# Patient Record
Sex: Female | Born: 1935 | Race: White | Hispanic: No | Marital: Married | Smoking: Never smoker
Health system: Southern US, Community
[De-identification: ages and names within clinical notes are randomized; demographics above are authoritative.]

## PROBLEM LIST (undated history)

## (undated) ENCOUNTER — Emergency Department: Admission: EM | Payer: Medicare Other | Source: Home / Self Care

## (undated) DIAGNOSIS — E785 Hyperlipidemia, unspecified: Secondary | ICD-10-CM

## (undated) DIAGNOSIS — N390 Urinary tract infection, site not specified: Secondary | ICD-10-CM

## (undated) DIAGNOSIS — I10 Essential (primary) hypertension: Secondary | ICD-10-CM

## (undated) DIAGNOSIS — M797 Fibromyalgia: Secondary | ICD-10-CM

## (undated) DIAGNOSIS — I2510 Atherosclerotic heart disease of native coronary artery without angina pectoris: Secondary | ICD-10-CM

## (undated) DIAGNOSIS — G4733 Obstructive sleep apnea (adult) (pediatric): Secondary | ICD-10-CM

## (undated) DIAGNOSIS — Z5189 Encounter for other specified aftercare: Secondary | ICD-10-CM

## (undated) DIAGNOSIS — M1990 Unspecified osteoarthritis, unspecified site: Secondary | ICD-10-CM

## (undated) DIAGNOSIS — Z8719 Personal history of other diseases of the digestive system: Secondary | ICD-10-CM

## (undated) DIAGNOSIS — I639 Cerebral infarction, unspecified: Secondary | ICD-10-CM

## (undated) DIAGNOSIS — F329 Major depressive disorder, single episode, unspecified: Secondary | ICD-10-CM

## (undated) DIAGNOSIS — R9439 Abnormal result of other cardiovascular function study: Secondary | ICD-10-CM

## (undated) DIAGNOSIS — R079 Chest pain, unspecified: Secondary | ICD-10-CM

## (undated) DIAGNOSIS — K219 Gastro-esophageal reflux disease without esophagitis: Secondary | ICD-10-CM

## (undated) DIAGNOSIS — F32A Depression, unspecified: Secondary | ICD-10-CM

## (undated) HISTORY — DX: Encounter for other specified aftercare: Z51.89

## (undated) HISTORY — PX: BACK SURGERY: SHX140

## (undated) HISTORY — DX: Essential (primary) hypertension: I10

## (undated) HISTORY — DX: Fibromyalgia: M79.7

## (undated) HISTORY — DX: Cerebral infarction, unspecified: I63.9

## (undated) HISTORY — DX: Chest pain, unspecified: R07.9

## (undated) HISTORY — PX: CARPAL TUNNEL RELEASE: SHX101

## (undated) HISTORY — DX: Hyperlipidemia, unspecified: E78.5

## (undated) HISTORY — PX: OTHER SURGICAL HISTORY: SHX169

## (undated) HISTORY — DX: Obstructive sleep apnea (adult) (pediatric): G47.33

## (undated) HISTORY — DX: Gastro-esophageal reflux disease without esophagitis: K21.9

## (undated) HISTORY — DX: Major depressive disorder, single episode, unspecified: F32.9

## (undated) HISTORY — DX: Unspecified osteoarthritis, unspecified site: M19.90

## (undated) HISTORY — DX: Depression, unspecified: F32.A

## (undated) HISTORY — DX: Atherosclerotic heart disease of native coronary artery without angina pectoris: I25.10

## (undated) HISTORY — DX: Urinary tract infection, site not specified: N39.0

## (undated) HISTORY — DX: Abnormal result of other cardiovascular function study: R94.39

---

## 1962-08-21 HISTORY — PX: OTHER SURGICAL HISTORY: SHX169

## 1962-08-21 HISTORY — PX: APPENDECTOMY: SHX54

## 1962-08-21 HISTORY — PX: OVARIAN CYST REMOVAL: SHX89

## 1974-08-21 HISTORY — PX: ABDOMINAL HYSTERECTOMY: SHX81

## 1975-08-22 HISTORY — PX: OTHER SURGICAL HISTORY: SHX169

## 1993-08-21 HISTORY — PX: BREAST BIOPSY: SHX20

## 1998-05-19 ENCOUNTER — Emergency Department (HOSPITAL_COMMUNITY): Admission: EM | Admit: 1998-05-19 | Discharge: 1998-05-19 | Payer: Self-pay | Admitting: Emergency Medicine

## 1998-09-08 ENCOUNTER — Inpatient Hospital Stay (HOSPITAL_COMMUNITY): Admission: RE | Admit: 1998-09-08 | Discharge: 1998-09-20 | Payer: Self-pay | Admitting: Addiction Psychiatry

## 1998-09-21 ENCOUNTER — Encounter (HOSPITAL_COMMUNITY): Admission: RE | Admit: 1998-09-21 | Discharge: 1998-12-20 | Payer: Self-pay | Admitting: Addiction Psychiatry

## 1999-02-07 ENCOUNTER — Other Ambulatory Visit: Admission: RE | Admit: 1999-02-07 | Discharge: 1999-02-07 | Payer: Self-pay | Admitting: *Deleted

## 1999-09-26 ENCOUNTER — Encounter: Admission: RE | Admit: 1999-09-26 | Discharge: 1999-09-26 | Payer: Self-pay

## 2000-05-09 ENCOUNTER — Encounter: Admission: RE | Admit: 2000-05-09 | Discharge: 2000-05-09 | Payer: Self-pay | Admitting: Family Medicine

## 2000-05-09 ENCOUNTER — Encounter: Payer: Self-pay | Admitting: Family Medicine

## 2000-08-22 ENCOUNTER — Other Ambulatory Visit: Admission: RE | Admit: 2000-08-22 | Discharge: 2000-08-22 | Payer: Self-pay | Admitting: *Deleted

## 2002-03-24 ENCOUNTER — Encounter: Payer: Self-pay | Admitting: Family Medicine

## 2002-03-24 ENCOUNTER — Encounter: Admission: RE | Admit: 2002-03-24 | Discharge: 2002-03-24 | Payer: Self-pay | Admitting: Family Medicine

## 2002-06-19 ENCOUNTER — Other Ambulatory Visit: Admission: RE | Admit: 2002-06-19 | Discharge: 2002-06-19 | Payer: Self-pay | Admitting: Family Medicine

## 2003-08-04 ENCOUNTER — Encounter: Admission: RE | Admit: 2003-08-04 | Discharge: 2003-08-04 | Payer: Self-pay | Admitting: Family Medicine

## 2003-08-22 HISTORY — PX: CATARACT EXTRACTION, BILATERAL: SHX1313

## 2004-05-27 ENCOUNTER — Encounter: Payer: Self-pay | Admitting: Family Medicine

## 2006-09-21 ENCOUNTER — Other Ambulatory Visit: Admission: RE | Admit: 2006-09-21 | Discharge: 2006-09-21 | Payer: Self-pay | Admitting: Family Medicine

## 2006-10-01 ENCOUNTER — Encounter: Payer: Self-pay | Admitting: Family Medicine

## 2007-03-14 ENCOUNTER — Encounter: Payer: Self-pay | Admitting: Family Medicine

## 2007-03-14 LAB — CONVERTED CEMR LAB: Pap Smear: NORMAL

## 2007-08-22 HISTORY — PX: CARDIOVASCULAR STRESS TEST: SHX262

## 2007-10-09 ENCOUNTER — Encounter: Payer: Self-pay | Admitting: Family Medicine

## 2007-10-09 LAB — CONVERTED CEMR LAB
ALT: 32 units/L
AST: 29 units/L
Albumin: 4.6 g/dL
Alkaline Phosphatase: 91 units/L
Calcium: 9.8 mg/dL
Chloride: 103 meq/L
Cholesterol: 182 mg/dL
GFR calc non Af Amer: >59 mL/min
Glucose, Bld: 97 mg/dL
LDL Cholesterol: 103 mg/dL
MCHC: 33.7 g/dL
MCV: 96 fL
Potassium: 4.2 meq/L
RBC: 4.51 M/uL
RDW: 13.3 %
Sodium: 143 meq/L
VLDL: 15 mg/dL

## 2008-02-28 ENCOUNTER — Encounter: Payer: Self-pay | Admitting: Family Medicine

## 2008-02-28 LAB — CONVERTED CEMR LAB
ALT: 30 units/L
AST: 34 units/L
Albumin: 4 g/dL
Alkaline Phosphatase: 75 units/L
Anion Gap: 12.4
BUN: 12 mg/dL
CO2: 31 meq/L
Calcium: 9.8 mg/dL
Chloride: 104 meq/L
Cholesterol: 214 mg/dL
Creatinine, Ser: 0.8 mg/dL
Direct LDL: 135 mg/dL
GFR calc Af Amer: 70.12 mL/min
GFR calc non Af Amer: 84.84 mL/min
Glucose, Bld: 95 mg/dL
HCT: 44.1 %
HDL: 64 mg/dL
Hemoglobin: 15.1 g/dL
MCHC: 34.2 g/dL
MCV: 96.4 fL
Platelets: 218 10*3/uL
Potassium: 5.4 meq/L
RBC: 4.58 M/uL
RDW: 12.4 %
Sodium: 142 meq/L
Total Bilirubin: 1.1 mg/dL
Total CHOL/HDL Ratio: 3.34
Total Protein: 6.7 g/dL
Triglycerides: 83 mg/dL
Vit D, 25-Hydroxy: 24.3 ng/mL
WBC: 5.3 10*3/uL

## 2008-04-23 ENCOUNTER — Encounter (INDEPENDENT_AMBULATORY_CARE_PROVIDER_SITE_OTHER): Payer: Self-pay | Admitting: *Deleted

## 2008-05-15 ENCOUNTER — Emergency Department (HOSPITAL_COMMUNITY): Admission: EM | Admit: 2008-05-15 | Discharge: 2008-05-15 | Payer: Self-pay | Admitting: Emergency Medicine

## 2008-06-08 ENCOUNTER — Encounter: Payer: Self-pay | Admitting: Family Medicine

## 2008-06-08 LAB — CONVERTED CEMR LAB
ALT: 27 units/L
AST: 26 units/L
Alkaline Phosphatase: 96 units/L
Bilirubin, Direct: 0.13 mg/dL
Direct LDL: 133 mg/dL
HDL: 51 mg/dL
Total Bilirubin: 0.5 mg/dL

## 2008-06-22 ENCOUNTER — Encounter (INDEPENDENT_AMBULATORY_CARE_PROVIDER_SITE_OTHER): Payer: Self-pay | Admitting: *Deleted

## 2008-08-31 ENCOUNTER — Encounter: Payer: Self-pay | Admitting: Family Medicine

## 2008-08-31 LAB — CONVERTED CEMR LAB
ALT: 28 units/L
AST: 28 units/L
Albumin: 3.9 g/dL
Alkaline Phosphatase: 75 units/L
Bilirubin, Direct: 0.2 mg/dL
Cholesterol: 195 mg/dL
Direct LDL: 115 mg/dL
HDL: 61 mg/dL
Indirect Bilirubin: 0.5 mg/dL
Total Bilirubin: 0.7 mg/dL
Total CHOL/HDL Ratio: 3.2
Total Protein: 6.7 g/dL
Triglycerides: 102 mg/dL

## 2008-10-08 ENCOUNTER — Encounter: Payer: Self-pay | Admitting: Family Medicine

## 2008-10-08 LAB — CONVERTED CEMR LAB
Anion Gap: 12.8
BUN: 11 mg/dL
CO2: 30 meq/L
Chloride: 105 meq/L
Glucose, Bld: 94 mg/dL
Potassium: 4.8 meq/L

## 2008-12-08 ENCOUNTER — Encounter: Payer: Self-pay | Admitting: Family Medicine

## 2009-01-04 ENCOUNTER — Encounter (INDEPENDENT_AMBULATORY_CARE_PROVIDER_SITE_OTHER): Payer: Self-pay | Admitting: *Deleted

## 2009-04-09 ENCOUNTER — Encounter: Admission: RE | Admit: 2009-04-09 | Discharge: 2009-04-09 | Payer: Self-pay | Admitting: Specialist

## 2009-04-12 ENCOUNTER — Encounter: Payer: Self-pay | Admitting: Family Medicine

## 2009-04-25 ENCOUNTER — Encounter: Admission: RE | Admit: 2009-04-25 | Discharge: 2009-04-25 | Payer: Self-pay | Admitting: Specialist

## 2009-05-27 ENCOUNTER — Encounter: Payer: Self-pay | Admitting: Family Medicine

## 2009-07-22 ENCOUNTER — Encounter: Payer: Self-pay | Admitting: Family Medicine

## 2009-07-22 LAB — CONVERTED CEMR LAB
ALT: 36 units/L
AST: 31 units/L
Albumin: 4 g/dL
Anion Gap: 12.3
CO2: 29 meq/L
Calcium: 9.5 mg/dL
Chloride: 103 meq/L
Sodium: 140 meq/L
Vit D, 25-Hydroxy: 43.3 ng/mL

## 2009-08-21 LAB — HM DEXA SCAN

## 2009-10-12 ENCOUNTER — Encounter: Payer: Self-pay | Admitting: Family Medicine

## 2009-10-12 LAB — CONVERTED CEMR LAB
Cholesterol: 170 mg/dL
Direct LDL: 89 mg/dL
Triglycerides: 71 mg/dL

## 2010-02-25 ENCOUNTER — Encounter: Payer: Self-pay | Admitting: Family Medicine

## 2010-02-25 DIAGNOSIS — R32 Unspecified urinary incontinence: Secondary | ICD-10-CM | POA: Insufficient documentation

## 2010-02-25 DIAGNOSIS — K219 Gastro-esophageal reflux disease without esophagitis: Secondary | ICD-10-CM | POA: Insufficient documentation

## 2010-02-25 DIAGNOSIS — Z87448 Personal history of other diseases of urinary system: Secondary | ICD-10-CM | POA: Insufficient documentation

## 2010-02-25 DIAGNOSIS — E785 Hyperlipidemia, unspecified: Secondary | ICD-10-CM | POA: Insufficient documentation

## 2010-02-25 DIAGNOSIS — Z9189 Other specified personal risk factors, not elsewhere classified: Secondary | ICD-10-CM | POA: Insufficient documentation

## 2010-02-25 DIAGNOSIS — F329 Major depressive disorder, single episode, unspecified: Secondary | ICD-10-CM | POA: Insufficient documentation

## 2010-03-01 DIAGNOSIS — M791 Myalgia, unspecified site: Secondary | ICD-10-CM | POA: Insufficient documentation

## 2010-03-01 DIAGNOSIS — F411 Generalized anxiety disorder: Secondary | ICD-10-CM | POA: Insufficient documentation

## 2010-03-01 DIAGNOSIS — E7800 Pure hypercholesterolemia, unspecified: Secondary | ICD-10-CM | POA: Insufficient documentation

## 2010-03-01 DIAGNOSIS — M790 Rheumatism, unspecified: Secondary | ICD-10-CM | POA: Insufficient documentation

## 2010-03-01 DIAGNOSIS — K449 Diaphragmatic hernia without obstruction or gangrene: Secondary | ICD-10-CM | POA: Insufficient documentation

## 2010-03-01 DIAGNOSIS — M159 Polyosteoarthritis, unspecified: Secondary | ICD-10-CM | POA: Insufficient documentation

## 2010-03-01 DIAGNOSIS — M810 Age-related osteoporosis without current pathological fracture: Secondary | ICD-10-CM | POA: Insufficient documentation

## 2010-03-01 DIAGNOSIS — M797 Fibromyalgia: Secondary | ICD-10-CM | POA: Insufficient documentation

## 2010-03-01 DIAGNOSIS — K224 Dyskinesia of esophagus: Secondary | ICD-10-CM | POA: Insufficient documentation

## 2010-03-15 ENCOUNTER — Ambulatory Visit: Payer: Self-pay | Admitting: Family Medicine

## 2010-05-04 ENCOUNTER — Encounter: Payer: Self-pay | Admitting: Family Medicine

## 2010-05-05 ENCOUNTER — Encounter: Payer: Self-pay | Admitting: Family Medicine

## 2010-05-09 ENCOUNTER — Encounter: Payer: Self-pay | Admitting: Family Medicine

## 2010-05-23 ENCOUNTER — Ambulatory Visit: Payer: Self-pay | Admitting: Family Medicine

## 2010-05-23 DIAGNOSIS — M899 Disorder of bone, unspecified: Secondary | ICD-10-CM | POA: Insufficient documentation

## 2010-05-23 DIAGNOSIS — M949 Disorder of cartilage, unspecified: Secondary | ICD-10-CM

## 2010-05-25 LAB — CONVERTED CEMR LAB: Calcium, Total (PTH): 9.3 mg/dL (ref 8.4–10.5)

## 2010-06-15 ENCOUNTER — Ambulatory Visit: Payer: Self-pay | Admitting: Family Medicine

## 2010-06-16 ENCOUNTER — Ambulatory Visit: Payer: Self-pay | Admitting: Family Medicine

## 2010-06-16 ENCOUNTER — Telehealth: Payer: Self-pay | Admitting: Family Medicine

## 2010-06-28 ENCOUNTER — Telehealth: Payer: Self-pay | Admitting: Family Medicine

## 2010-07-13 ENCOUNTER — Encounter: Admission: RE | Admit: 2010-07-13 | Discharge: 2010-07-13 | Payer: Self-pay | Admitting: Neurosurgery

## 2010-08-04 ENCOUNTER — Encounter: Payer: Self-pay | Admitting: Family Medicine

## 2010-09-02 ENCOUNTER — Telehealth (INDEPENDENT_AMBULATORY_CARE_PROVIDER_SITE_OTHER): Payer: Self-pay | Admitting: *Deleted

## 2010-09-05 ENCOUNTER — Encounter: Payer: Self-pay | Admitting: Family Medicine

## 2010-09-05 ENCOUNTER — Ambulatory Visit
Admission: RE | Admit: 2010-09-05 | Discharge: 2010-09-05 | Payer: Self-pay | Source: Home / Self Care | Attending: Family Medicine | Admitting: Family Medicine

## 2010-09-05 ENCOUNTER — Other Ambulatory Visit: Payer: Self-pay | Admitting: Family Medicine

## 2010-09-05 LAB — LIPID PANEL
Cholesterol: 193 mg/dL (ref 0–200)
HDL: 61.9 mg/dL (ref >39.00)
LDL Cholesterol: 114 mg/dL — ABNORMAL HIGH (ref 0–99)
Total CHOL/HDL Ratio: 3
Triglycerides: 85 mg/dL (ref 0.0–149.0)
VLDL: 17 mg/dL (ref 0.0–40.0)

## 2010-09-05 LAB — CBC WITH DIFFERENTIAL/PLATELET
Basophils Absolute: 0.1 10*3/uL (ref 0.0–0.1)
Basophils Relative: 0.8 % (ref 0.0–3.0)
Eosinophils Absolute: 0.2 10*3/uL (ref 0.0–0.7)
Eosinophils Relative: 2.8 % (ref 0.0–5.0)
HCT: 43.2 % (ref 36.0–46.0)
Hemoglobin: 14.7 g/dL (ref 12.0–15.0)
Lymphocytes Relative: 31.2 % (ref 12.0–46.0)
Lymphs Abs: 2.2 10*3/uL (ref 0.7–4.0)
MCHC: 34.1 g/dL (ref 30.0–36.0)
MCV: 98 fl (ref 78.0–100.0)
Monocytes Absolute: 0.4 10*3/uL (ref 0.1–1.0)
Monocytes Relative: 6 % (ref 3.0–12.0)
Neutro Abs: 4.1 10*3/uL (ref 1.4–7.7)
Neutrophils Relative %: 59.2 % (ref 43.0–77.0)
Platelets: 229 10*3/uL (ref 150.0–400.0)
RBC: 4.4 Mil/uL (ref 3.87–5.11)
RDW: 12.9 % (ref 11.5–14.6)
WBC: 7 10*3/uL (ref 4.5–10.5)

## 2010-09-05 LAB — BASIC METABOLIC PANEL
BUN: 16 mg/dL (ref 6–23)
CO2: 30 mEq/L (ref 19–32)
Calcium: 8.9 mg/dL (ref 8.4–10.5)
Chloride: 102 mEq/L (ref 96–112)
Creatinine, Ser: 0.8 mg/dL (ref 0.4–1.2)
GFR: 78.39 mL/min (ref >60.00)
Glucose, Bld: 108 mg/dL — ABNORMAL HIGH (ref 70–99)
Potassium: 4.1 mEq/L (ref 3.5–5.1)
Sodium: 139 mEq/L (ref 135–145)

## 2010-09-05 LAB — HEPATIC FUNCTION PANEL
ALT: 24 U/L (ref 0–35)
AST: 25 U/L (ref 0–37)
Albumin: 4 g/dL (ref 3.5–5.2)
Alkaline Phosphatase: 81 U/L (ref 39–117)
Bilirubin, Direct: 0.1 mg/dL (ref 0.0–0.3)
Total Bilirubin: 0.8 mg/dL (ref 0.3–1.2)
Total Protein: 6.8 g/dL (ref 6.0–8.3)

## 2010-09-06 LAB — CONVERTED CEMR LAB: Vit D, 25-Hydroxy: 37 ng/mL (ref 30–89)

## 2010-09-12 ENCOUNTER — Ambulatory Visit
Admission: RE | Admit: 2010-09-12 | Discharge: 2010-09-12 | Payer: Self-pay | Source: Home / Self Care | Attending: Family Medicine | Admitting: Family Medicine

## 2010-09-12 ENCOUNTER — Encounter: Payer: Self-pay | Admitting: Family Medicine

## 2010-09-12 DIAGNOSIS — R7309 Other abnormal glucose: Secondary | ICD-10-CM | POA: Insufficient documentation

## 2010-09-15 ENCOUNTER — Encounter: Payer: Self-pay | Admitting: Family Medicine

## 2010-09-22 NOTE — Letter (Signed)
Summary: [facility]: Immunoassay Fecal Occult Blood (iFOB) Order Form  [place] [facility]  [address] [place], [place] [address]   Phone: [phone]  Fax: [phone]      [facility]: Immunoassay Fecal Occult Blood (iFOB) Order Form   September 12, 2010 MRN: [id number]   [facility] 1931-07-10   Physicican Name:________[email or web address]_________________  Diagnosis Code:_________duncan_________________      Crawford Givens MD

## 2010-09-22 NOTE — Progress Notes (Signed)
Summary: Patient Med & Allergy List  Patient Med & Allergy List   Imported By: Lanelle Bal 03/21/2010 08:44:48  _____________________________________________________________________  External Attachment:    Type:   Image     Comment:   External Document

## 2010-09-22 NOTE — Assessment & Plan Note (Signed)
Summary: PAIN IN LEFT SIDE/DLO   Vital Signs:  Patient profile:   77 year old female Height:      61.5 inches Weight:      179.50 pounds BMI:     33.49 Temp:     98.6 degrees F oral Pulse rate:   80 / minute Pulse rhythm:   regular BP sitting:   142 / 82  (left arm) Cuff size:   regular  Vitals Entered By: Delilah Shan CMA Duncan Dull) (June 16, 2010 9:42 AM) CC: Pain in left side, under left breast.   History of Present Illness: Pain and soreness under L ribs.  Intermittent for several weeks.  "When I burp, it gets better."  It feels sore.  Not short of breath.  No FCNAV.  No BRBPR.  No diarrhea.  No known triggers.  Not tender to palpation.  "It hurts or it doesn't."  Taking 81mg  ASA.  No other NSAIDs except for very rare advil.  No CP but known hiatal hernia.  Occ burning under the sternum that is better with drinking liquid.    Allergies: 1)  ! Keflex 2)  ! Fosamax 3)  ! Macrobid 4)  ! Sulfa 5)  ! Ceclor 6)  ! Streptomycin 7)  ! Lanoxin 8)  ! Donnatal (Belladonna Alk-Phenobarbital) 9)  ! * Chlortrimeton 10)  ! Trileptal (Oxcarbazepine) 11)  ! * Librium 12)  ! Tetracycline  Past History:  Past Medical History: Last updated: 03/15/2010 UTI'S, HX OF (ICD-V13.00) URINARY INCONTINENCE (ICD-788.30) HYPERLIPIDEMIA (ICD-272.4) GERD (ICD-530.81) DEPRESSION (ICD-311) OSA Fibromyalgia Osteoporosis OA Normal stress test with Dr. Mayford Knife 2009 Colonoscopy 2005 Other MDs- Dr. Shelle Iron- ortho Dr. Wynonia Lawman- psych Dr. Ethelene Hal- spine  Review of Systems       See HPI.  Otherwise negative.    Physical Exam  General:  GEN: nad, alert and oriented HEENT: mucous membranes moist NECK: supple w/o LA CV: rrr.  no murmur PULM: ctab, no inc wob ABD: soft, +bs, minimally tender to palpation under L ribs, no rebound, abdominal exam o/w unremarkable for acute change SKIN: no acute rash    Impression & Recommendations:  Problem # 1:  HIATAL HERNIA WITH REFLUX (ICD-553.3) Likely due  to GERD/HH.  Head of bed elevated already.  D/w patient re: H2 blockade.  If not improving, consider PPI.  No other work up at this time as exam is benign o/w.  She agrees.   Complete Medication List: 1)  Neurontin 300 Mg Caps (Gabapentin) .... Take 1 tablet by mouth three times a day 2)  Zocor 20 Mg Tabs (Simvastatin) .... Take 1 tablet by mouth once a day 3)  Multivitamins Tabs (Multiple vitamin) .... Take 1 tablet by mouth once a day 4)  Co Q-10 30 Mg Caps (Coenzyme q10) .[name].. 100 mg. once daily 5)  Folic Acid Powd (Folic acid) .... Once daily 6)  Vitamin D 1000 Unit Tabs (Cholecalciferol) .... Take 1 tablet by mouth once a day 7)  Fish Oil Oil (Fish oil) .... 1,400 mg. by mouth three times a day 8)  Aspirin 81 Mg Tabs (Aspirin) .... Take 1 tablet by mouth once a day 9)  Calcium Carbonate-vitamin D 600-400 Mg-unit Tabs (Calcium carbonate-vitamin d) .... Occasionally 10)  Nuvigil 150 Mg Tabs (Armodafinil) .... Take one half tablet by mouth daily 11)  Cymbalta 60 Mg Cpep (Duloxetine hcl) .... Take 1 tablet by mouth two times a day 12)  Zyprexa 5 Mg Tabs (Olanzapine) .... Take one half tablet daily 13)  Zantac 150 Mg Tabs (Ranitidine hcl) .[name].. 1 by mouth two times a day  Patient Instructions: 1)  I would take zantac 150mg  by mouth two times a day for the pain.  Please let me know if it isn't getting better.  Take care.  Glad to see you today.    Orders Added: 1)  Est. Patient Level III [45409]    Current Allergies (reviewed today): ! KEFLEX ! FOSAMAX ! MACROBID ! SULFA ! CECLOR ! STREPTOMYCIN ! LANOXIN ! DONNATAL (BELLADONNA ALK-PHENOBARBITAL) ! * CHLORTRIMETON ! TRILEPTAL (OXCARBAZEPINE) ! * LIBRIUM ! TETRACYCLINE

## 2010-09-22 NOTE — Assessment & Plan Note (Signed)
Summary: FLU SHOT/CLE  Nurse Visit   Allergies: 1)  ! Keflex 2)  ! Fosamax 3)  ! Macrobid 4)  ! Sulfa 5)  ! Ceclor 6)  ! Streptomycin 7)  ! Lanoxin 8)  ! Donnatal (Belladonna Alk-Phenobarbital) 9)  ! * Chlortrimeton 10)  ! Trileptal (Oxcarbazepine) 11)  ! * Librium 12)  ! Tetracycline  Orders Added: 1)  Flu Vaccine 10yrs + MEDICARE PATIENTS [Q2039] 2)  Administration Flu vaccine - MCR [G0008] Flu Vaccine Consent Questions     Do you have a history of severe allergic reactions to this vaccine? no    Any prior history of allergic reactions to egg and/or gelatin? no    Do you have a sensitivity to the preservative Thimersol? no    Do you have a past history of Guillan-Barre Syndrome? no    Do you currently have an acute febrile illness? no    Have you ever had a severe reaction to latex? no    Vaccine information given and explained to patient? yes    Are you currently pregnant? no    Lot Number:[id number]   Exp Date:02/18/2011   Site Given  Left Deltoid IM1

## 2010-09-22 NOTE — Assessment & Plan Note (Signed)
Summary: CPX/JRR   Vital Signs:  Patient profile:   77 year old female Height:      61.5 inches Weight:      177.75 pounds BMI:     33.16 Temp:     98.7 degrees F oral Pulse rate:   80 / minute Pulse rhythm:   regular BP sitting:   136 / 80  (left arm) Cuff size:   regular  Vitals Entered By: Delilah Shan CMA  Dull) (September 12, 2010 11:30 AM) CC: CPX  Vision Screening:Left eye with correction: 20 / 25 Right eye with correction: 20 / 25 Both eyes with correction: 20 / 25        Vision Entered By: Delilah Shan CMA  Dull) (September 12, 2010 11:44 AM)   History of Present Illness: I have personally reviewed the Medicare Annual Wellness questionnaire and have noted 1.   The patient's medical and social history 2.   Their use of alcohol, tobacco or illicit drugs 3.   Their current medications and supplements 4.   The patient's functional ability including ADL's, fall risks, home safety risks and hearing or visual             impairment. 5.   Diet and physical activities 6.   Evidence for depression or mood disorders  The patients weight, height, BMI and visual acuity have been recorded in the chart I have made referrals, counseling and provided education to the patient based review of the above and I have provided the pt with a written personalized care plan for preventive services.   Injected this past week for back pain. It took a few days to get over the injection, but she is now having some relief from it.    Elevated Cholesterol: Using medications without problems:yes Muscle aches: no Other complaints: no  H/o low bone mass and DXA reviewed.  Prev intolerant of fosamax. Plan to recheck DXA in 2-3 years.   Mild hyperglycemia.  No h/o DM2.  D/w patient WJ:[id number], weight, and high carb foods.   Preventive Screening-Counseling & Management  Alcohol-Tobacco     Smoking Status: never  Allergies: 1)  ! Keflex 2)  ! Fosamax 3)  ! Macrobid 4)  ! Sulfa 5)  !  Ceclor 6)  ! Streptomycin 7)  ! Lanoxin 8)  ! Donnatal (Belladonna Alk-Phenobarbital) 9)  ! * Chlortrimeton 10)  ! Trileptal (Oxcarbazepine) 11)  ! * Librium 12)  ! Tetracycline 13)  ! Hydrocodone-Acetaminophen (Hydrocodone-Acetaminophen) 14)  ! Omeprazole (Omeprazole)  Past History:  Family History: Last updated: 02/25/2010 Family History of Alcoholism/Addiction Family History of Arthritis Family History High cholesterol Family History Hypertension Family History Kidney disease Family History of Stroke F 1st degree relative <60 Family History of Sudden Death Family History of Parkinson's Family History of Heart Disease Father: Died at age 15 of uncertain causes Mother: Died of age Siblings: 1 brother died of MVA, 1 sister died in a home accident, 1 brother died during coronary artery bypass grafting.   2 sisters alive, one with HTN and the other has a brain aneurysm  Social History: Last updated: 03/15/2010 Married 1953, husband is well 5 pregnancies, 4 live births, all well (5th was unexpect pregnancy and patient needed to terminate due to concurrent medical problems at the time). 11 grandchildren, well. Never Smoked Alcohol use-no Drug use-no Regular exercise-no Caffeine - yes, coffee, tea, 2 servings daily Diet - yes, low fat, low sugar  Past Medical History: UTI'S, HX OF (ICD-V13.00) URINARY INCONTINENCE (  ICD-788.30) per Dr. McDiarmid HYPERLIPIDEMIA (ICD-272.4) GERD (ICD-530.81) DEPRESSION (ICD-311) OSA Fibromyalgia Osteoporosis OA Normal stress test with Dr. Mayford Knife 2009 Colonoscopy 2005 Other MDs- Dr. Shelle Iron- ortho Dr. Wynonia Lawman- psych Dr. Ethelene Hal- spine Dr. Renold Don- gyn  Past Surgical History: 1995    Breast Biopsy 1976    Hysterectomy 1977    Back fusions, missing vertebra 1954, 1956, 1957, 1960 childbirth 1964    Ovarian cystectomy and appendectomy 1971    D & C 1993   Depression 1997   Electroconvulsive therapy for depression 2000    Depression 2008   Bilateral cataract extraction 2011   DXA- recheck in 2013 or 2014  Review of Systems       See HPI.  Otherwise negative.    Physical Exam  General:  GEN: nad, alert and oriented HEENT: mucous membranes moist NECK: supple w/o LA CV: rrr.  no murmur PULM: ctab, no inc wob ABD: soft, +bs SKIN: no acute rash    Impression & Recommendations:  Problem # 1:  Preventive Health Care (ICD-V70.0) Diet/exercise/labs d/w patient.  Prev with zostavax done.  Other vaccines up to date. Colonsocopy prev done with [facility].  IFOB sent today. Mammogram and gyn exam per Dr. Senaida Ores per patient.   Problem # 2:  OSTEOPOROSIS (ICD-733.00) No change in meds.  Recheck DXA in 2-3 years . Her updated medication list for this problem includes:    Vitamin D 1000 Unit Tabs (Cholecalciferol) .[name]... Take 1 tablet by mouth once a day    Calcium Carbonate-vitamin D 600-400 Mg-unit Tabs (Calcium carbonate-vitamin d) ..... Occasionally  Her updated medication list for this problem includes:    Vitamin D 1000 Unit Tabs (Cholecalciferol) .[name]... Take 1 tablet by mouth once a day    Calcium Carbonate-vitamin D 600-400 Mg-unit Tabs (Calcium carbonate-vitamin d) ..... Occasionally  Problem # 3:  PURE HYPERCHOLESTEROLEMIA (ICD-272.0) No change in meds. labs are reviewed wiht patient. Her updated medication list for this problem includes:    Zocor 20 Mg Tabs (Simvastatin) .[name]... Take 1 tablet by mouth once a day  Her updated medication list for this problem includes:    Zocor 20 Mg Tabs (Simvastatin) .[name]... Take 1 tablet by mouth once a day  Problem # 4:  HYPERGLYCEMIA, MILD (ICD-790.29) d/w patient ZO:XWRU and exercise.   Complete Medication List: 1)  Neurontin 300 Mg Caps (Gabapentin) .... Take 1 tablet by mouth three times a day 2)  Zocor 20 Mg Tabs (Simvastatin) .... Take 1 tablet by mouth once a day 3)  Multivitamins Tabs (Multiple vitamin) .... Take 1 tablet by mouth once a day 4)  Co  Q-10 30 Mg Caps (Coenzyme q10) .[name].. 100 mg. once daily 5)  Folic Acid Powd (Folic acid) .... 2 mg. tablet once daily 6)  Vitamin D 1000 Unit Tabs (Cholecalciferol) .... Take 1 tablet by mouth once a day 7)  Fish Oil Oil (Fish oil) .... 1,200 mg. by mouth once daily 8)  Aspirin 81 Mg Tabs (Aspirin) .... Take 1 tablet by mouth once a day 9)  Calcium Carbonate-vitamin D 600-400 Mg-unit Tabs (Calcium carbonate-vitamin d) .... Occasionally 10)  Cymbalta 60 Mg Cpep (Duloxetine hcl) .... Take 1 tablet by mouth two times a day 11)  Zyprexa 5 Mg Tabs (Olanzapine) .... Take one half tablet (2.5 mg.) daily 12)  Nuvigil 150 Mg Tabs (Armodafinil) .... Take 1/2 tablet (75 mg.) by mouth once daily  Other Orders: Medicare -1st Annual Wellness Visit [phone])  Patient Instructions: 1)  I would  start back with a walking routine and gradually increase your distance.  Try to avoid sweets.  We should check your sugar yearly.  Don't change your meds in the meantime. 2)  Let me know if you have other concerns.  I would recheck your labs in 1 year.     Orders Added: 1)  Medicare -1st Annual Wellness Visit [G0438] 2)  Est. Patient Level IV [16109]   Immunization History:  Zostavax History:    Zostavax # 1:  zostavax (08/22/2007)   Immunization History:  Zostavax History:    Zostavax # 1:  Zostavax (08/22/2007)  Current Allergies (reviewed today): ! KEFLEX ! FOSAMAX ! MACROBID ! SULFA ! CECLOR ! STREPTOMYCIN ! LANOXIN ! DONNATAL (BELLADONNA ALK-PHENOBARBITAL) ! * CHLORTRIMETON ! TRILEPTAL (OXCARBAZEPINE) ! * LIBRIUM ! TETRACYCLINE ! HYDROCODONE-ACETAMINOPHEN (HYDROCODONE-ACETAMINOPHEN) ! OMEPRAZOLE (OMEPRAZOLE)    Appended Document: CPX/JRR    Clinical Lists Changes  Observations: Added new observation of COLONNXTDUE: 05/2014 (09/14/2010 22:32) Added new observation of PMH OSTEOPRS: yes (09/14/2010 22:32) Added new observation of CHIEF CMPLNT: Preventive Care (09/14/2010  22:32) Added new observation of COLONOSCOPY: scattered diverticula, no polyps per Dr. Matthias Hughs (05/27/2004 22:33)       Colonoscopy  Procedure date:  05/27/2004  Findings:      scattered diverticula, no polyps per Dr. Matthias Hughs    Colorectal Screening:  Next Colonoscopy Due:    05/21/2014  PAP Screening:    Last PAP smear:  03/14/2007  Mammogram Screening:    Last Mammogram:  02/09/2010  Osteoporosis Risk Assessment:  Risk Factors for Fracture or Low Bone Density:   Race (White or Asian):     yes   Smoking status:       never  Immunization & Chemoprophylaxis:    Tetanus vaccine: Td  (08/22/2003)    Influenza vaccine: Fluvax 3+  (06/15/2010)    Pneumovax: given  (08/21/1998)

## 2010-09-22 NOTE — Progress Notes (Signed)
Summary: side is not any better  Phone Note Call from Patient Call back at Home Phone [phone]   Caller: Patient Call For: Crawford Givens MD Summary of Call: Pt states the zantac has helped her hiatal hernia but her left side is not any better- in fact that is worse.  She is asking what to do next.  [facility] [address] in case something is called in. Initial call taken by: Lowella Petties CMA, AAMA,  June 28, 2010 4:32 PM  Follow-up for Phone Call        I would get patient to try prilosec otc over the counter 20mg  a day.  I would use the generic- omeprezole.  follow up if not improving.  thanks.  Follow-up by: Crawford Givens MD,  June 28, 2010 5:17 PM  Additional Follow-up for Phone Call Additional follow up Details #1::        Patient Advised.  Additional Follow-up by: Delilah Shan CMA Duncan Dull),  June 29, 2010 8:46 AM

## 2010-09-22 NOTE — Progress Notes (Signed)
----   Converted from flag ---- ---- 09/01/2010 11:10 PM, Crawford Givens MD wrote: cmet/lipid 401.1 cbc v58.69 vitamin D 733.00  ---- 09/01/2010 10:46 AM, Liane Comber CMA (AAMA) wrote: Lab orders please! Good Morning! This pt is scheduled for cpx labs Monday, which labs to draw and dx codes to use? Thanks Tasha ------------------------------

## 2010-09-22 NOTE — Progress Notes (Signed)
Summary: corrected med list  Phone Note Call from Patient   Caller: Patient Call For: Crawford Givens MD Summary of Call: Pt called to make corrections to her medicine list for nuvigil and zyprexa.  Changes made in EMR. Initial call taken by: Lowella Petties CMA, AAMA,  June 16, 2010 3:28 PM  Follow-up for Phone Call        noted.  Follow-up by: Crawford Givens MD,  June 16, 2010 5:05 PM    New/Updated Medications: NUVIGIL 150 MG TABS (ARMODAFINIL) take one half tablet by mouth daily ZYPREXA 5 MG TABS (OLANZAPINE) take one half tablet daily  Prior Medications: NEURONTIN 300 MG CAPS (GABAPENTIN) Take 1 tablet by mouth three times a day ZOCOR 20 MG TABS (SIMVASTATIN) Take 1 tablet by mouth once a day MULTIVITAMINS   TABS (MULTIPLE VITAMIN) Take 1 tablet by mouth once a day CO Q-10 30 MG  CAPS (COENZYME Q10) 100 mg. once daily FOLIC ACID   POWD (FOLIC ACID) once daily VITAMIN D 1000 UNIT  TABS (CHOLECALCIFEROL) Take 1 tablet by mouth once a day FISH OIL   OIL (FISH OIL) 1,400 mg. by mouth three times a day ASPIRIN 81 MG  TABS (ASPIRIN) Take 1 tablet by mouth once a day CALCIUM CARBONATE-VITAMIN D 600-400 MG-UNIT  TABS (CALCIUM CARBONATE-VITAMIN D) occasionally NUVIGIL 150 MG TABS (ARMODAFINIL) take one half tablet by mouth daily CYMBALTA 60 MG CPEP (DULOXETINE HCL) Take 1 tablet by mouth two times a day Current Allergies: ! KEFLEX ! FOSAMAX ! MACROBID ! SULFA ! CECLOR ! STREPTOMYCIN ! LANOXIN ! DONNATAL (BELLADONNA ALK-PHENOBARBITAL) ! * CHLORTRIMETON ! TRILEPTAL (OXCARBAZEPINE) ! * LIBRIUM ! TETRACYCLINE

## 2010-09-22 NOTE — Assessment & Plan Note (Signed)
Summary: TRANSFER FROM [place]/[facility]   Vital Signs:  Patient profile:   77 year old female Height:      61.5 inches Weight:      179.25 pounds BMI:     33.44 Temp:     98.5 degrees F oral Pulse rate:   88 / minute Pulse rhythm:   regular BP sitting:   124 / 88  (left arm) Cuff size:   regular  Vitals Entered By: Delilah Shan CMA  Dull) (March 15, 2010 2:04 PM) CC: Transfer from [place]   History of Present Illness: Elevated Cholesterol: Using medications without problems:yes Muscle aches: occ, but due to fibromyalgia and overall improved on neurontin Other complaints: needs refill on statin.   Allergies: 1)  ! Keflex 2)  ! Fosamax 3)  ! Macrobid 4)  ! Sulfa 5)  ! Ceclor 6)  ! Streptomycin 7)  ! Lanoxin 8)  ! Donnatal (Belladonna Alk-Phenobarbital) 9)  ! * Chlortrimeton 10)  ! Trileptal (Oxcarbazepine) 11)  ! * Librium 12)  ! Tetracycline  Past History:  Past Medical History: UTI'S, HX OF (ICD-V13.00) URINARY INCONTINENCE (ICD-788.30) HYPERLIPIDEMIA (ICD-272.4) GERD (ICD-530.81) DEPRESSION (ICD-311) OSA Fibromyalgia Osteoporosis OA Normal stress test with Dr. Mayford Knife 2009 Colonoscopy 2005 Other MDs- Dr. Shelle Iron- ortho Dr. Wynonia Lawman- psych Dr. Ethelene Hal- spine  Social History: Married 1953, husband is well 5 pregnancies, 4 live births, all well (5th was unexpect pregnancy and patient needed to terminate due to concurrent medical problems at the time). 11 grandchildren, well. Never Smoked Alcohol use-no Drug use-no Regular exercise-no Caffeine - yes, coffee, tea, 2 servings daily Diet - yes, low fat, low sugar  Review of Systems       See HPI.  Otherwise negative.    Physical Exam  General:  GEN: nad, alert and oriented HEENT: mucous membranes moist NECK: supple w/o LA CV: rrr.  no murmur PULM: ctab, no inc wob ABD: soft, +bs EXT: no edema SKIN: no acute rash    Impression & Recommendations:  Problem # 1:  PURE HYPERCHOLESTEROLEMIA (ICD-272.0) No  change in meds. See instructions.  Her updated medication list for this problem includes:    Zocor 20 Mg Tabs (Simvastatin) .[name]... Take 1 tablet by mouth once a day  Complete Medication List: 1)  Provigil 100 Mg Tabs (Modafinil) .... Take 1/2 tablet (50 mg.) once daily 2)  Cymbalta 20 Mg Cpep (Duloxetine hcl) .... Take 1 tablet by mouth once a day 3)  Zyprexa 5 Mg Tabs (Olanzapine) .... Take 1 tablet by mouth once a day 4)  Neurontin 300 Mg Caps (Gabapentin) .... Take 1 tablet by mouth three times a day 5)  Zocor 20 Mg Tabs (Simvastatin) .... Take 1 tablet by mouth once a day 6)  Multivitamins Tabs (Multiple vitamin) .... Take 1 tablet by mouth once a day 7)  Co Q-10 30 Mg Caps (Coenzyme q10) .[name].. 100 mg. once daily 8)  Folic Acid Powd (Folic acid) .... Once daily 9)  Vitamin D 1000 Unit Tabs (Cholecalciferol) .... Take 1 tablet by mouth once a day 10)  Fish Oil Oil (Fish oil) .... 1,400 mg. by mouth three times a day 11)  Aspirin 81 Mg Tabs (Aspirin) .... Take 1 tablet by mouth once a day 12)  Calcium Carbonate-vitamin D 600-400 Mg-unit Tabs (Calcium carbonate-vitamin d) .... Occasionally  Patient Instructions: 1)  Please schedule a follow-up appointment in 6 months for a physical.   2)  Come by for fasting labs a few days before the appointment.  3)  268.9 Vit D 4)  401.1 cmet, lipid Prescriptions: ZOCOR 20 MG TABS (SIMVASTATIN) Take 1 tablet by mouth once a day  #90 x 3   Entered and Authorized by:   Crawford Givens MD   Signed by:   Crawford Givens MD on 03/15/2010   Method used:   [facility]  [address] [facility]ret[facility]       [address]       [place], [place]  [address]       Ph: [phone] or [phone]       Fax: [phone]   RxID:   [id number]   Current Allergies (reviewed today): ! [facility] ! FOSAMAX ! MACROBID ! SULFA ! CECLOR ! STREPTOMYCIN ! LANOXIN ! DONNATAL (BELLADONNA ALK-PHENOBARBITAL) ! * CHLORTRIMETON ! TRILEPTAL  (OXCARBAZEPINE) ! * LIBRIUM ! TETRACYCLINE

## 2010-09-22 NOTE — Consult Note (Signed)
Summary: [facility]  [facility]   Imported By: Lanelle Bal 08/16/2010 14:47:32  _____________________________________________________________________  External Attachment:    Type:   Image     Comment:   External Document  Appended Document: [facility]    Clinical Lists Changes  Observations: Added new observation of PAST MED HX: UTI'S, HX OF (ICD-V13.00) URINARY INCONTINENCE (ICD-788.30) per Dr. McDiarmid HYPERLIPIDEMIA (ICD-272.4) GERD (ICD-530.81) DEPRESSION (ICD-311) OSA Fibromyalgia Osteoporosis OA Normal stress test with Dr. Mayford Knife 2009 Colonoscopy 2005 Other MDs- Dr. Shelle Iron- ortho Dr. Wynonia Lawman- psych Dr. Ethelene Hal- spine   (08/17/2010 11:33)       Past History:  Past Medical History: UTI'S, HX OF (ICD-V13.00) URINARY INCONTINENCE (ICD-788.30) per Dr. McDiarmid HYPERLIPIDEMIA (ICD-272.4) GERD (ICD-530.81) DEPRESSION (ICD-311) OSA Fibromyalgia Osteoporosis OA Normal stress test with Dr. Mayford Knife 2009 Colonoscopy 2005 Other MDs- Dr. Shelle Iron- ortho Dr. Wynonia Lawman- psych Dr. Ethelene Hal- spine

## 2010-09-22 NOTE — Letter (Signed)
Summary: [employer] conservation officer [employer] Wellness Visit Questionnaire   [employer] Annual Wellness Visit Questionnaire   Imported By: Beau Fanny 09/14/2010 10:09:58  _____________________________________________________________________  External Attachment:    Type:   Image     Comment:   External Document

## 2010-09-27 ENCOUNTER — Other Ambulatory Visit: Payer: Self-pay | Admitting: Family Medicine

## 2010-09-27 ENCOUNTER — Other Ambulatory Visit: Payer: Self-pay

## 2010-09-27 ENCOUNTER — Encounter (INDEPENDENT_AMBULATORY_CARE_PROVIDER_SITE_OTHER): Payer: Self-pay | Admitting: *Deleted

## 2010-09-27 DIAGNOSIS — Z1211 Encounter for screening for malignant neoplasm of colon: Secondary | ICD-10-CM

## 2010-09-28 ENCOUNTER — Encounter (INDEPENDENT_AMBULATORY_CARE_PROVIDER_SITE_OTHER): Payer: Self-pay | Admitting: *Deleted

## 2010-10-06 NOTE — Letter (Signed)
Summary: [facility]  [facility]   Imported By: Kassie Mends 09/23/2010 11:20:28  _____________________________________________________________________  External Attachment:    Type:   Image     Comment:   External Document  Appended Document: [facility]    Clinical Lists Changes  Observations: Added new observation of PAST MED HX: UTI'S, HX OF (ICD-V13.00) URINARY INCONTINENCE (ICD-788.30), Urge incontinence per Dr. McDiarmid HYPERLIPIDEMIA (ICD-272.4) GERD (ICD-530.81) DEPRESSION (ICD-311) OSA Fibromyalgia Osteoporosis OA Normal stress test with Dr. Mayford Knife 2009 Colonoscopy 2005 Other MDs- Dr. Shelle Iron- ortho Dr. Wynonia Lawman- psych Dr. Ethelene Hal- spine Dr. Renold Don- gyn (09/26/2010 17:10)       Past History:  Past Medical History: UTI'S, HX OF (ICD-V13.00) URINARY INCONTINENCE (ICD-788.30), Urge incontinence per Dr. Perley Jain HYPERLIPIDEMIA (ICD-272.4) GERD (ICD-530.81) DEPRESSION (ICD-311) OSA Fibromyalgia Osteoporosis OA Normal stress test with Dr. Mayford Knife 2009 Colonoscopy 2005 Other MDs- Dr. Shelle Iron- ortho Dr. Wynonia Lawman- psych Dr. Ethelene Hal- spine Dr. Renold Don- gyn

## 2010-10-06 NOTE — Procedures (Signed)
Summary: Colonoscopy  Colonoscopy   Imported By: Kassie Mends 09/26/2010 11:36:18  _____________________________________________________________________  External Attachment:    Type:   Image     Comment:   External Document

## 2010-10-06 NOTE — Letter (Signed)
Summary: Results Follow up Letter  [name] [facility]  [address] [place], [place] [address]   Phone: [phone]  Fax: [phone]    09/28/2010 MRN: [id number]    [facility] [address] [place], [place]  [address]  [place]    Dear [name],  The following are the results of your recent test(s):  Test         Result    Pap Smear:        Normal _____  Not Normal _____ Comments: ______________________________________________________ Cholesterol: LDL(Bad cholesterol):         Your goal is less than:         HDL (Good cholesterol):       Your goal is more than: Comments:  ______________________________________________________ Mammogram:        Normal _____  Not Normal _____ Comments:  ___________________________________________________________________ Hemoccult:        Normal __X___  Not normal _______ Comments:    Yearly follow up is recommended.   _____________________________________________________________________ Other Tests:    We routinely do not discuss normal results over the telephone.  If you desire a copy of the results, or you have any questions about this information we can discuss them at your next office visit.   Sincerely,    Dwana Curd. Para March, M.D.  [facility]

## 2010-11-14 ENCOUNTER — Encounter: Payer: Self-pay | Admitting: Family Medicine

## 2011-04-27 ENCOUNTER — Ambulatory Visit (INDEPENDENT_AMBULATORY_CARE_PROVIDER_SITE_OTHER): Payer: Medicare Other | Admitting: Family Medicine

## 2011-04-27 ENCOUNTER — Encounter: Payer: Self-pay | Admitting: Family Medicine

## 2011-04-27 VITALS — BP 118/80 | HR 89 | Temp 98.4°F | Wt 178.0 lb

## 2011-04-27 DIAGNOSIS — R531 Weakness: Secondary | ICD-10-CM | POA: Insufficient documentation

## 2011-04-27 DIAGNOSIS — R5383 Other fatigue: Secondary | ICD-10-CM

## 2011-04-27 DIAGNOSIS — R5381 Other malaise: Secondary | ICD-10-CM

## 2011-04-27 NOTE — Progress Notes (Signed)
Fatigue.  Still with days of "feeling bad" but then "I'll kind of come out of it for a while and then have another bad period."  She has had some days when she felt like getting out in the yard and working, but she felt weak for a day or so after that.  No fevers, chills, tick bites, vomiting.  Motivation is "pretty good" per patient except on the bad days.  On a bad day, she's tired but can't rest.  The alternating sx have been going on most of the summer, since late spring.  No SI/HI.  She has something to look forward to- Wed night meals at church.  Her daughter was in the hospital for 2 months and this was tough on the patient- daughter is doing better now.  She doesn't feel depressed "mentally, but maybe my body is depressed."  Today is a better day.  Her sleep apnea is improved.    Seeing Dr. Wynonia Lawman next week. They had talked about changing meds prev, but no dose change has been done yet.    PMH and SH reviewed  ROS: See HPI, otherwise noncontributory.  Meds, vitals, and allergies reviewed.   GEN: nad, alert and oriented, affect bright HEENT: mucous membranes moist NECK: supple w/o LA CV: rrr. PULM: ctab, no inc wob ABD: soft, +bs EXT: no edema SKIN: no acute rash

## 2011-04-27 NOTE — Assessment & Plan Note (Signed)
Likely multifactorial. D/w pt about mood, possible other causes, certainly with inc in family stress recently. Will check basic labs and then have her f/u with psych. She agrees.  Okay for outpatient f/u.  No SI/HI.

## 2011-04-27 NOTE — Patient Instructions (Signed)
Don't change your meds yet.  Talk with Dr. Wynonia Lawman first.  You can get your results through our phone system.  Follow the instructions on the blue card. Take care.  Glad to see you.

## 2011-04-28 LAB — CBC WITH DIFFERENTIAL/PLATELET
Basophils Absolute: 0.2 10*3/uL — ABNORMAL HIGH (ref 0.0–0.1)
Eosinophils Absolute: 0.1 10*3/uL (ref 0.0–0.7)
HCT: 42.9 % (ref 36.0–46.0)
Lymphocytes Relative: 32.6 % (ref 12.0–46.0)
Lymphs Abs: 2.6 10*3/uL (ref 0.7–4.0)
MCHC: 33.9 g/dL (ref 30.0–36.0)
Monocytes Relative: 5.3 % (ref 3.0–12.0)
Platelets: 216 10*3/uL (ref 150.0–400.0)
RDW: 12.9 % (ref 11.5–14.6)

## 2011-04-28 LAB — COMPREHENSIVE METABOLIC PANEL
ALT: 25 U/L (ref 0–35)
AST: 28 U/L (ref 0–37)
CO2: 31 mEq/L (ref 19–32)
Calcium: 9.6 mg/dL (ref 8.4–10.5)
Chloride: 103 mEq/L (ref 96–112)
GFR: 84.65 mL/min (ref >60.00)
Potassium: 4.3 mEq/L (ref 3.5–5.1)
Sodium: 141 mEq/L (ref 135–145)
Total Protein: 7 g/dL (ref 6.0–8.3)

## 2011-05-09 ENCOUNTER — Encounter: Payer: Self-pay | Admitting: Family Medicine

## 2011-05-15 ENCOUNTER — Encounter: Payer: Self-pay | Admitting: Family Medicine

## 2011-05-22 LAB — COMPREHENSIVE METABOLIC PANEL
ALT: 22
AST: 40 — ABNORMAL HIGH
Alkaline Phosphatase: 78
CO2: 25
Chloride: 106
GFR calc Af Amer: >60
GFR calc non Af Amer: >60
Glucose, Bld: 156 — ABNORMAL HIGH
Sodium: 141
Total Bilirubin: 1.6 — ABNORMAL HIGH

## 2011-05-22 LAB — DIFFERENTIAL
Basophils Absolute: 0
Basophils Relative: 0
Eosinophils Absolute: 0
Eosinophils Relative: 0
Neutrophils Relative %: 92 — ABNORMAL HIGH

## 2011-05-22 LAB — CBC
Hemoglobin: 15.7 — ABNORMAL HIGH
MCV: 97.1
RBC: 4.64
WBC: 11.7 — ABNORMAL HIGH

## 2011-05-22 LAB — URINALYSIS, ROUTINE W REFLEX MICROSCOPIC
Bilirubin Urine: NEGATIVE
Hgb urine dipstick: NEGATIVE
Nitrite: NEGATIVE
Specific Gravity, Urine: 1.023
pH: 8

## 2011-05-22 LAB — URINE MICROSCOPIC-ADD ON

## 2011-05-22 LAB — LIPASE, BLOOD: Lipase: 18

## 2011-06-01 ENCOUNTER — Ambulatory Visit (INDEPENDENT_AMBULATORY_CARE_PROVIDER_SITE_OTHER): Payer: Medicare Other

## 2011-06-01 DIAGNOSIS — Z23 Encounter for immunization: Secondary | ICD-10-CM

## 2011-07-04 ENCOUNTER — Ambulatory Visit (INDEPENDENT_AMBULATORY_CARE_PROVIDER_SITE_OTHER): Payer: Medicare Other | Admitting: Family Medicine

## 2011-07-04 ENCOUNTER — Encounter: Payer: Self-pay | Admitting: Family Medicine

## 2011-07-04 VITALS — BP 128/78 | HR 72 | Temp 98.7°F | Wt 177.2 lb

## 2011-07-04 DIAGNOSIS — R5383 Other fatigue: Secondary | ICD-10-CM

## 2011-07-04 DIAGNOSIS — R5381 Other malaise: Secondary | ICD-10-CM

## 2011-07-04 DIAGNOSIS — M791 Myalgia, unspecified site: Secondary | ICD-10-CM

## 2011-07-04 DIAGNOSIS — IMO0001 Reserved for inherently not codable concepts without codable children: Secondary | ICD-10-CM

## 2011-07-04 NOTE — Patient Instructions (Signed)
Don't take the simvastatin and see how you do.  Call back with an update in the next week or two.  You can get your results through our phone system.  Follow the instructions on the blue card.

## 2011-07-04 NOTE — Progress Notes (Signed)
Aches and fatigue:  She saw Dr. Wynonia Lawman and was changed to nuvigil.  That helped some but then she had a return of symptoms- fatigue, 'felt like I had the flu' after she would exert/work in the yard. She would feel better after resting some, but it may take a few days. "When I feel that bad, I can't even rest."    She's had some diffuse aches, B hand pain. Occ with pain near deltoids B.  Occ would have back and neck aches, occ HA.  See saw Dr. Wynonia Lawman again and he didn't think it was still related to depression.  She just stopped the statin this week.  It was unclear if she had a prev exposure to HCV.  She did have a tick bite prev.   Last OV note reviewed  Meds, vitals, and allergies reviewed.   ROS: See HPI.  Otherwise, noncontributory.  nad ncat Mmm Neck supple, no LA rrr ctab Skin w/o rash ttp B on the deltoids and upper arms but no rash

## 2011-07-05 LAB — RHEUMATOID FACTOR: Rheumatoid fact SerPl-aCnc: <10 [IU]/mL

## 2011-07-05 LAB — HEPATITIS C ANTIBODY: HCV Ab: NEGATIVE

## 2011-07-05 LAB — B. BURGDORFI ANTIBODIES: B burgdorferi Ab IgG+IgM: 0.15 {ISR}

## 2011-07-06 ENCOUNTER — Encounter: Payer: Self-pay | Admitting: Family Medicine

## 2011-07-06 NOTE — Assessment & Plan Note (Signed)
See notes on labs.  This could be statin related.  Hold stating and she'll update me. >25 min spent with face to face with patient, >50% counseling.  She agrees with plan.

## 2011-07-17 ENCOUNTER — Encounter: Payer: Self-pay | Admitting: Family Medicine

## 2011-07-17 ENCOUNTER — Ambulatory Visit (INDEPENDENT_AMBULATORY_CARE_PROVIDER_SITE_OTHER): Payer: Medicare Other | Admitting: Family Medicine

## 2011-07-17 VITALS — BP 132/82 | HR 85 | Temp 98.4°F | Wt 177.1 lb

## 2011-07-17 DIAGNOSIS — IMO0001 Reserved for inherently not codable concepts without codable children: Secondary | ICD-10-CM

## 2011-07-17 DIAGNOSIS — M791 Myalgia, unspecified site: Secondary | ICD-10-CM

## 2011-07-17 NOTE — Patient Instructions (Signed)
Don't change your meds for now.  Stay off the cholesterol medicine.  You can get your results through our phone system.  Follow the instructions on the blue card.  Call back with an update in 2 weeks.  Take care.

## 2011-07-17 NOTE — Assessment & Plan Note (Addendum)
She may be some better and this still may be statin related.  I doubt PMR, but will check ESR.  She'll continue off statin and notify me in about 2 weeks with update.  We talked about this is in detail.  She agrees with plan.  >25 min spent with face to face with patient, >50% counseling and/or coordinating care.

## 2011-07-17 NOTE — Progress Notes (Signed)
Prev note:    Aches and fatigue: She saw Dr. Wynonia Lawman and was changed to nuvigil. That helped some but then she had a return of symptoms- fatigue, 'felt like I had the flu' after she would exert/work in the yard. She would feel better after resting some, but it may take a few days. "When I feel that bad, I can't even rest."   She's had some diffuse aches, B hand pain. Occ with pain near deltoids B. Occ would have back and neck aches, occ HA. See saw Dr. Wynonia Lawman again and he didn't think it was still related to depression. She just stopped the statin this week. It was unclear if she had a prev exposure to HCV. She did have a tick bite prev.    Today's visit-   Labs unremarkable at last OV.    Additional history:  Her best friend died and she didn't feel well enough to go to the funeral.  She's been off statin for 2 weeks.  "I've had a lot of bad days in the last 2 weeks."  She'll still feels poorly for 1 day after exertion, it was prev for 2 days after the exertion/yardwork (so this is a slight improvement).  She has less deltoid pain over the last few days (slight improvement).  She has some vague feelings of nausea but no vomiting.    She may be some better.  She doesn't have sig weakness in proximal limb muscles and tenderness is slightly better than at last OV, per her report.   Meds, vitals, and allergies reviewed.   ROS: See HPI.  Otherwise, noncontributory.  nad ncat Mmm rrr ctab abd soft, normal BS No weakness on testing the proximal arms/legs Deltoids less tender today than on prev exam.

## 2011-07-26 ENCOUNTER — Encounter: Payer: Self-pay | Admitting: Family Medicine

## 2011-07-27 ENCOUNTER — Telehealth: Payer: Self-pay | Admitting: Internal Medicine

## 2011-07-27 NOTE — Telephone Encounter (Signed)
Patient is still very easy fatigue and that she is doing somewhat better her hands are still bothering her they are still sore and hurting some.  Also her arms are still hurting some but she said she maybe a little better.

## 2011-07-27 NOTE — Telephone Encounter (Signed)
If she is still making some progress then I wouldn't change anything at this point.  If the hand symptoms are intolerable, we can try to address it, but I wouldn't change anything else now.  Thanks for the update.

## 2011-07-28 NOTE — Telephone Encounter (Signed)
Patient advised.  She says she can tolerate it except for her right thumb and that she may see a hand specialist to see if they will inject it.  Otherwise, she says it is tolerable and she can wait to see if it will continue to improve.

## 2011-07-30 NOTE — Telephone Encounter (Signed)
Noted, I'll await input from patient.

## 2011-08-07 ENCOUNTER — Ambulatory Visit: Payer: Medicare Other | Admitting: Family Medicine

## 2011-08-24 ENCOUNTER — Ambulatory Visit (INDEPENDENT_AMBULATORY_CARE_PROVIDER_SITE_OTHER): Payer: Medicare Other | Admitting: Family Medicine

## 2011-08-24 ENCOUNTER — Encounter: Payer: Self-pay | Admitting: Family Medicine

## 2011-08-24 DIAGNOSIS — IMO0001 Reserved for inherently not codable concepts without codable children: Secondary | ICD-10-CM

## 2011-08-24 DIAGNOSIS — M79609 Pain in unspecified limb: Secondary | ICD-10-CM

## 2011-08-24 DIAGNOSIS — M79646 Pain in unspecified finger(s): Secondary | ICD-10-CM

## 2011-08-24 NOTE — Progress Notes (Signed)
She's had diffuse aches, in arms, chest and shoulders but not legs.  We had checked basic rheum labs prev, w/o sig finding.  It was unclear if the pain was related to depression.  She has had f/u with Dr. Wynonia Lawman about this and psych meds were adjusted.  Some days are worse than others.  She usually feels better late in the afternoon.  She is off statin.    R thumb IP joint with clicking but no locking.  It would be painful, down to the base of the thumb.  Still with some discomfort, but this is improved overall.  Still with some pain opening a door.  Pre with another trigger finger injected with relief.    Meds, vitals, and allergies reviewed.   ROS: See HPI.  Otherwise, noncontributory.  nad R hand with chronic changes noted at the IP joints but no active erythema. Able to oppose the thumb, normal rom at the MCP and IP.  No weakness, able to make composite fist and finkelstein neg Clicking but no locking at R1st IP joint.  No joint line pain

## 2011-08-24 NOTE — Patient Instructions (Signed)
If the aches don't get better, let me know so we can talk about seeing rheumatology.  If the thumb isn't better, call me so we can set up the hand center appointment.

## 2011-08-25 DIAGNOSIS — M79646 Pain in unspecified finger(s): Secondary | ICD-10-CM | POA: Insufficient documentation

## 2011-08-25 NOTE — Assessment & Plan Note (Signed)
She'll f/u with psych.  If not related to MDD, then she'll call back and we'll consider rheum eval.

## 2011-08-25 NOTE — Assessment & Plan Note (Signed)
Not locking and some improved recently.  Will have pt call back if no more progress and refer to hand at that point.  She agrees.

## 2011-09-04 ENCOUNTER — Telehealth: Payer: Self-pay | Admitting: Internal Medicine

## 2011-09-04 DIAGNOSIS — M791 Myalgia, unspecified site: Secondary | ICD-10-CM

## 2011-09-04 NOTE — Telephone Encounter (Signed)
Patient states she is still hurting in her arms, shoulders and would like a referral to a Rheumatologist.

## 2011-09-05 ENCOUNTER — Telehealth: Payer: Self-pay | Admitting: Internal Medicine

## 2011-09-05 DIAGNOSIS — M79646 Pain in unspecified finger(s): Secondary | ICD-10-CM

## 2011-09-05 NOTE — Telephone Encounter (Signed)
Patient notified as instructed by telephone. 

## 2011-09-05 NOTE — Telephone Encounter (Signed)
Patient called and stated you had informed her if her thumb wasn't better you would refer her to a hand doctor.  She would like that referral.

## 2011-09-05 NOTE — Telephone Encounter (Signed)
Referral ordered

## 2011-09-06 DIAGNOSIS — F3289 Other specified depressive episodes: Secondary | ICD-10-CM | POA: Diagnosis not present

## 2011-09-06 DIAGNOSIS — F068 Other specified mental disorders due to known physiological condition: Secondary | ICD-10-CM | POA: Diagnosis not present

## 2011-09-06 DIAGNOSIS — F329 Major depressive disorder, single episode, unspecified: Secondary | ICD-10-CM | POA: Diagnosis not present

## 2011-09-06 NOTE — Telephone Encounter (Signed)
Referral ordered

## 2011-09-07 DIAGNOSIS — M19049 Primary osteoarthritis, unspecified hand: Secondary | ICD-10-CM | POA: Diagnosis not present

## 2011-09-07 DIAGNOSIS — M6530 Trigger finger, unspecified finger: Secondary | ICD-10-CM | POA: Diagnosis not present

## 2011-09-08 NOTE — Telephone Encounter (Signed)
Patient saw Dr Merlyn Lot on 09/07/2011 records sent.

## 2011-10-04 DIAGNOSIS — F3289 Other specified depressive episodes: Secondary | ICD-10-CM | POA: Diagnosis not present

## 2011-10-04 DIAGNOSIS — F329 Major depressive disorder, single episode, unspecified: Secondary | ICD-10-CM | POA: Diagnosis not present

## 2011-10-04 DIAGNOSIS — F068 Other specified mental disorders due to known physiological condition: Secondary | ICD-10-CM | POA: Diagnosis not present

## 2011-11-22 DIAGNOSIS — F3289 Other specified depressive episodes: Secondary | ICD-10-CM | POA: Diagnosis not present

## 2011-11-22 DIAGNOSIS — F068 Other specified mental disorders due to known physiological condition: Secondary | ICD-10-CM | POA: Diagnosis not present

## 2011-11-22 DIAGNOSIS — F329 Major depressive disorder, single episode, unspecified: Secondary | ICD-10-CM | POA: Diagnosis not present

## 2011-11-23 DIAGNOSIS — M6530 Trigger finger, unspecified finger: Secondary | ICD-10-CM | POA: Diagnosis not present

## 2011-12-04 ENCOUNTER — Encounter: Payer: Self-pay | Admitting: Family Medicine

## 2011-12-21 DIAGNOSIS — M19049 Primary osteoarthritis, unspecified hand: Secondary | ICD-10-CM | POA: Diagnosis not present

## 2011-12-21 DIAGNOSIS — F3289 Other specified depressive episodes: Secondary | ICD-10-CM | POA: Diagnosis not present

## 2011-12-21 DIAGNOSIS — F329 Major depressive disorder, single episode, unspecified: Secondary | ICD-10-CM | POA: Diagnosis not present

## 2011-12-21 DIAGNOSIS — F068 Other specified mental disorders due to known physiological condition: Secondary | ICD-10-CM | POA: Diagnosis not present

## 2011-12-21 DIAGNOSIS — M6530 Trigger finger, unspecified finger: Secondary | ICD-10-CM | POA: Diagnosis not present

## 2011-12-31 ENCOUNTER — Other Ambulatory Visit: Payer: Self-pay | Admitting: Family Medicine

## 2011-12-31 DIAGNOSIS — E7800 Pure hypercholesterolemia, unspecified: Secondary | ICD-10-CM

## 2012-01-04 ENCOUNTER — Other Ambulatory Visit (INDEPENDENT_AMBULATORY_CARE_PROVIDER_SITE_OTHER): Payer: Medicare Other

## 2012-01-04 DIAGNOSIS — E7800 Pure hypercholesterolemia, unspecified: Secondary | ICD-10-CM | POA: Diagnosis not present

## 2012-01-04 DIAGNOSIS — M810 Age-related osteoporosis without current pathological fracture: Secondary | ICD-10-CM | POA: Diagnosis not present

## 2012-01-04 LAB — LIPID PANEL
Cholesterol: 232 mg/dL — ABNORMAL HIGH (ref 0–200)
HDL: 72.9 mg/dL (ref >39.00)
Total CHOL/HDL Ratio: 3
VLDL: 15.6 mg/dL (ref 0.0–40.0)

## 2012-01-04 LAB — COMPREHENSIVE METABOLIC PANEL
ALT: 24 U/L (ref 0–35)
AST: 22 U/L (ref 0–37)
Alkaline Phosphatase: 92 U/L (ref 39–117)
Calcium: 9.3 mg/dL (ref 8.4–10.5)
Chloride: 106 mEq/L (ref 96–112)
Creatinine, Ser: 0.8 mg/dL (ref 0.4–1.2)

## 2012-01-11 ENCOUNTER — Ambulatory Visit (INDEPENDENT_AMBULATORY_CARE_PROVIDER_SITE_OTHER): Payer: Medicare Other | Admitting: Family Medicine

## 2012-01-11 ENCOUNTER — Encounter: Payer: Self-pay | Admitting: Family Medicine

## 2012-01-11 VITALS — BP 126/84 | HR 87 | Temp 98.8°F | Ht 62.0 in | Wt 179.8 lb

## 2012-01-11 DIAGNOSIS — Z0000 Encounter for general adult medical examination without abnormal findings: Secondary | ICD-10-CM | POA: Diagnosis not present

## 2012-01-11 NOTE — Progress Notes (Signed)
I have personally reviewed the Medicare Annual Wellness questionnaire and have noted 1. The patient's medical and social history 2. Their use of alcohol, tobacco or illicit drugs 3. Their current medications and supplements 4. The patient's functional ability including ADL's, fall risks, home safety risks and hearing or visual             impairment. 5. Diet and physical activities 6. Evidence for depression or mood disorders  The patients weight, height, BMI have been recorded in the chart and visual acuity is per eye clinic.  I have made referrals, counseling and provided education to the patient based review of the above and I have provided the pt with a written personalized care plan for preventive services.  Living will.  She has one.  She'll check her records. Flu 2012 Shingles 2009 PNA 2000 Tetanus 2005 mammo 2012 DXA declined, she wouldn't want tx given her allergy history Colon 2005 See scanned forms.     She is feeling better after med changes.   Mood is good.  Depressive sx are better.  Energy level is better.  More active with less fatigue.  Per psych.   PMH and SH reviewed  ROS: See HPI, otherwise noncontributory.  Meds, vitals, and allergies reviewed.   nad ncat Mmm rrr ctab abd soft not ttp Ext w/o edema.

## 2012-01-11 NOTE — Patient Instructions (Addendum)
I would get a flu shot each fall.   Keep exercising, eat a healthy diet and call with questions.   Take care.

## 2012-01-12 DIAGNOSIS — Z0000 Encounter for general adult medical examination without abnormal findings: Secondary | ICD-10-CM | POA: Insufficient documentation

## 2012-02-08 DIAGNOSIS — F3289 Other specified depressive episodes: Secondary | ICD-10-CM | POA: Diagnosis not present

## 2012-02-08 DIAGNOSIS — F068 Other specified mental disorders due to known physiological condition: Secondary | ICD-10-CM | POA: Diagnosis not present

## 2012-02-08 DIAGNOSIS — F329 Major depressive disorder, single episode, unspecified: Secondary | ICD-10-CM | POA: Diagnosis not present

## 2012-05-01 DIAGNOSIS — F329 Major depressive disorder, single episode, unspecified: Secondary | ICD-10-CM | POA: Diagnosis not present

## 2012-05-01 DIAGNOSIS — F068 Other specified mental disorders due to known physiological condition: Secondary | ICD-10-CM | POA: Diagnosis not present

## 2012-05-01 DIAGNOSIS — F3289 Other specified depressive episodes: Secondary | ICD-10-CM | POA: Diagnosis not present

## 2012-05-03 ENCOUNTER — Ambulatory Visit: Payer: Medicare Other | Admitting: Family Medicine

## 2012-05-03 ENCOUNTER — Ambulatory Visit (INDEPENDENT_AMBULATORY_CARE_PROVIDER_SITE_OTHER): Payer: Medicare Other | Admitting: Internal Medicine

## 2012-05-03 ENCOUNTER — Encounter: Payer: Self-pay | Admitting: Internal Medicine

## 2012-05-03 ENCOUNTER — Telehealth: Payer: Self-pay | Admitting: Family Medicine

## 2012-05-03 VITALS — BP 136/100 | HR 87 | Temp 98.5°F | Wt 178.2 lb

## 2012-05-03 DIAGNOSIS — R030 Elevated blood-pressure reading, without diagnosis of hypertension: Secondary | ICD-10-CM

## 2012-05-03 DIAGNOSIS — Z23 Encounter for immunization: Secondary | ICD-10-CM | POA: Diagnosis not present

## 2012-05-03 NOTE — Telephone Encounter (Signed)
°  Caller: [place]/Patient; Patient Name: Robin Juarez; PCP: Crawford Givens Clelia Croft) [facility]); Best Callback Phone Number: [phone]; Reason for call:  She is having "spikes" in her B/P.  This AM at 0730 it was 156/103 and she just took it again and it is 186/106. Triaged Hypertension Diagnosed/ Suspected and needs to be seen in 4 hours for Systolic blood pressure of more than 180 mmHg or diastolic blood pressure of more than . Appt made for 1045 with [place].  Home care and call back instructions given.

## 2012-05-03 NOTE — Progress Notes (Signed)
Subjective:    Patient ID: Robin Juarez, female    DOB: 06/22/1931, 78 y.o.   MRN: [id number]  HPI [name] to psychiatrist for her meds on Wednesday BP was 166/98 at Dr Wynonia Lawman [facility]) No symptoms Checks with wrist monitor at home--- 180/109 this AM  No headaches No chest pain No SOB No change in vision  Dr Wynonia Lawman did add lamictal since her last visit here Has been on cymbalta a long time  Current Outpatient Prescriptions on File Prior to Visit  Medication Sig Dispense Refill  . aspirin 81 MG tablet Take 81 mg by mouth daily.        . cholecalciferol (VITAMIN D) 1000 UNITS tablet Take 1,000 Units by mouth daily.        . DULoxetine (CYMBALTA) 60 MG capsule Take 60 mg by mouth daily.       [name] gabapentin (NEURONTIN) 300 MG capsule Take 300 mg by mouth 3 (three) times daily.       [name] OLANZapine (ZYPREXA) 5 MG tablet Take one half tablet by mouth at bedtime.      [name] co-enzyme Q-10 30 MG capsule Take 100 mg by mouth daily.        Allergies  Allergen Reactions  . Alendronate Sodium   . Belladonna Alk-Phenobarbital     REACTION: (Maybe)  . Cefaclor   . Cephalexin   . Chlordiazepoxide     REACTION: (Maybe)  . Digoxin   . Fish Oil     rash  . Hydrocodone-Acetaminophen     REACTION: Bad effect with other meds.  . Nitrofurantoin     REACTION: (Maybe)  . Omeprazole     rash  . Oxcarbazepine   . Simvastatin     Myalgias, possible intolerance  . Streptomycin   . Sulfonamide Derivatives     REACTION: (Maybe)  . Tetracycline     Past Medical History  Diagnosis Date  . Hyperlipidemia   . GERD (gastroesophageal reflux disease)   . Osteoporosis   . Arthritis   . OSA (obstructive sleep apnea)   . Fibromyalgia   . UTI (lower urinary tract infection)   . Urinary incontinence   . Depression 1993, 2000    Past Surgical History  Procedure Date  . Cardiovascular stress test 2009    Normal, Dr. Mayford Knife  . Breast biopsy 1995  . Abdominal hysterectomy 1976  . Back  fusions 1977    missing vertebra  . Childbirth [phone]  . Ovarian cyst removal 1964  . Appendectomy 1964  . Dilatation and currettage 1964  . Electroconvulsive therapy for depression 1997  . Cataract extraction, bilateral     Family History  Problem Relation Age of Onset  . Alcohol abuse Other     and addiction  . Arthritis Other   . Hyperlipidemia Other   . Hypertension Other   . Kidney disease Other   . Stroke Other   . Sudden death Other   . Parkinsonism Other   . Heart disease Other   . Heart disease Brother     Died during CABG  . Hypertension Sister   . Aneurysm Sister     Brain  . Hypertension Father   . Aneurysm Father     History   Social History  . Marital Status: Married    Spouse Name: N/A    Number of Children: 4  . Years of Education: N/A   Occupational History  . RETIRED    Social History Main Topics  .  Smoking status: Never Smoker   . Smokeless tobacco: Never Used   Comment: Does not live with smokers  . Alcohol Use: No  . Drug Use: No  . Sexually Active: Not on file   Other Topics Concern  . Not on file   Social History Narrative   Married 1953, husband is well.5 pregnancies, 4 live births, all well (5th was unexpect pregnancy and patient needed to terminate due to concurrent medical problems at the time)11 grandchildren.  2 great grandchildrenRegular exercise:  NoCaffeine:  Yes, coffee, tea, 2 servings daily.Diet:  Yes, low fat, low sugar   Review of Systems Voiding okay Mild rash on calves No prior BP problems    Objective:   Physical Exam  Constitutional: She appears well-developed and well-nourished. No distress.  Neck: Normal range of motion. Neck supple. No thyromegaly present.  Cardiovascular: Normal rate, regular rhythm and normal heart sounds.  Exam reveals no gallop.   No murmur heard. Pulmonary/Chest: Effort normal and breath sounds normal. No respiratory distress. She has no wheezes. She has no rales.    Musculoskeletal: She exhibits no edema and no tenderness.  Lymphadenopathy:    She has no cervical adenopathy.  Psychiatric: She has a normal mood and affect. Her behavior is normal.          Assessment & Plan:

## 2012-05-03 NOTE — Assessment & Plan Note (Signed)
BP Readings from Last 3 Encounters:  05/03/12 136/100  01/11/12 126/84  08/24/11 122/76   Repeat by me on right 144/94  Only new med is lamictal---I am not aware of this being associated with HTN Duloxetine can--but she has been on this for extended time without problems Will send note to Dr Wynonia Lawman. She wonders if she should try off the lamictal but I urged her not to change anything without discussing it with him I will set up follow up with Dr Para March She may want to monitor intermittently but not sure her wrist cuff is accurate Reassured that this doesn't pose an immediate danger---unless she develops symptoms like headaches, etc

## 2012-05-03 NOTE — Telephone Encounter (Signed)
Will evaluate in office

## 2012-05-21 ENCOUNTER — Encounter: Payer: Self-pay | Admitting: Family Medicine

## 2012-05-21 DIAGNOSIS — Z1231 Encounter for screening mammogram for malignant neoplasm of breast: Secondary | ICD-10-CM | POA: Diagnosis not present

## 2012-05-23 ENCOUNTER — Encounter: Payer: Self-pay | Admitting: Family Medicine

## 2012-05-23 DIAGNOSIS — R928 Other abnormal and inconclusive findings on diagnostic imaging of breast: Secondary | ICD-10-CM | POA: Diagnosis not present

## 2012-05-24 ENCOUNTER — Other Ambulatory Visit: Payer: Self-pay | Admitting: Radiology

## 2012-05-24 DIAGNOSIS — N6489 Other specified disorders of breast: Secondary | ICD-10-CM

## 2012-05-24 DIAGNOSIS — R928 Other abnormal and inconclusive findings on diagnostic imaging of breast: Secondary | ICD-10-CM

## 2012-05-29 ENCOUNTER — Ambulatory Visit
Admission: RE | Admit: 2012-05-29 | Discharge: 2012-05-29 | Disposition: A | Discharge status: Home / Self Care | Payer: Medicare Other | Source: Ambulatory Visit | Attending: Radiology | Admitting: Radiology

## 2012-05-29 DIAGNOSIS — N6489 Other specified disorders of breast: Secondary | ICD-10-CM

## 2012-05-29 DIAGNOSIS — R928 Other abnormal and inconclusive findings on diagnostic imaging of breast: Secondary | ICD-10-CM | POA: Diagnosis not present

## 2012-05-29 MED ORDER — GADOBENATE DIMEGLUMINE 529 MG/ML IV SOLN
15.0000 mL | Freq: Once | INTRAVENOUS | Status: AC | PRN
  Administered 2012-05-29: 15 mL via INTRAVENOUS

## 2012-05-30 ENCOUNTER — Encounter: Payer: Self-pay | Admitting: Family Medicine

## 2012-05-31 ENCOUNTER — Other Ambulatory Visit: Payer: Self-pay | Admitting: Radiology

## 2012-05-31 DIAGNOSIS — R928 Other abnormal and inconclusive findings on diagnostic imaging of breast: Secondary | ICD-10-CM

## 2012-05-31 DIAGNOSIS — N6489 Other specified disorders of breast: Secondary | ICD-10-CM | POA: Diagnosis not present

## 2012-06-03 ENCOUNTER — Ambulatory Visit: Payer: Medicare Other | Admitting: Family Medicine

## 2012-06-04 ENCOUNTER — Other Ambulatory Visit: Payer: Self-pay | Admitting: Radiology

## 2012-06-04 ENCOUNTER — Ambulatory Visit
Admission: RE | Admit: 2012-06-04 | Discharge: 2012-06-04 | Disposition: A | Discharge status: Home / Self Care | Payer: Medicare Other | Source: Ambulatory Visit | Attending: Radiology | Admitting: Radiology

## 2012-06-04 DIAGNOSIS — R928 Other abnormal and inconclusive findings on diagnostic imaging of breast: Secondary | ICD-10-CM

## 2012-06-04 DIAGNOSIS — N6089 Other benign mammary dysplasias of unspecified breast: Secondary | ICD-10-CM | POA: Diagnosis not present

## 2012-06-04 MED ORDER — GADOBENATE DIMEGLUMINE 529 MG/ML IV SOLN
15.0000 mL | Freq: Once | INTRAVENOUS | Status: AC | PRN
  Administered 2012-06-04: 15 mL via INTRAVENOUS

## 2012-06-17 ENCOUNTER — Encounter (INDEPENDENT_AMBULATORY_CARE_PROVIDER_SITE_OTHER): Payer: Self-pay | Admitting: General Surgery

## 2012-06-17 ENCOUNTER — Ambulatory Visit (INDEPENDENT_AMBULATORY_CARE_PROVIDER_SITE_OTHER): Payer: Medicare Other | Admitting: General Surgery

## 2012-06-17 VITALS — BP 134/84 | HR 83 | Temp 98.2°F | Ht 61.0 in | Wt 174.8 lb

## 2012-06-17 DIAGNOSIS — H40059 Ocular hypertension, unspecified eye: Secondary | ICD-10-CM | POA: Diagnosis not present

## 2012-06-17 DIAGNOSIS — H3530 Unspecified macular degeneration: Secondary | ICD-10-CM | POA: Diagnosis not present

## 2012-06-17 DIAGNOSIS — T1510XA Foreign body in conjunctival sac, unspecified eye, initial encounter: Secondary | ICD-10-CM | POA: Diagnosis not present

## 2012-06-17 DIAGNOSIS — R928 Other abnormal and inconclusive findings on diagnostic imaging of breast: Secondary | ICD-10-CM | POA: Diagnosis not present

## 2012-06-17 NOTE — Progress Notes (Signed)
Patient ID: Robin Juarez, female   DOB: 03/18/1931, 78 y.o.   MRN: [id number]  Chief Complaint  Patient presents with  . Pre-op Exam    eval br hyperlasia    HPI Robin Juarez is a 78 y.o. female.  Referred by Dr. Shaw-Duncan HPI This is a 72-year-old female who has a history of a benign left breast biopsy in the past who presents after undergoing a screening mammogram that showed a left breast abnormality. This was followed up by an MRI which showed no evidence of malignancy on the left side but did show a 2.6 x 0.6 x 0.5 cm area of linear clumped nodular enhancement. 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 She comes in today to discuss her options following this biopsy with no complaints referable to either breast. Past Medical History  Diagnosis Date  . Hyperlipidemia   . GERD (gastroesophageal reflux disease)   . Osteoporosis   . Arthritis   . OSA (obstructive sleep apnea)   . Fibromyalgia   . UTI (lower urinary tract infection)   . Urinary incontinence   . Depression 1993, 2000  . Blood transfusion without reported diagnosis     Past Surgical History  Procedure Date  . Cardiovascular stress test 2009    Normal, Dr. Turner  . Breast biopsy 1995  . Abdominal hysterectomy 1976  . Back fusions 1977    missing vertebra  . Childbirth 1954,1956,1957,1960  . Ovarian cyst removal 1964  . Appendectomy 1964  . Dilatation and currettage 1964  . Electroconvulsive therapy for depression 1997  . Cataract extraction, bilateral   . Eye surgery 2010    both eyes    Family History  Problem Relation Age of Onset  . Alcohol abuse Other     and addiction  . Arthritis Other   . Hyperlipidemia Other   . Hypertension Other   . Kidney disease Other   . Stroke Other   . Sudden death Other   . Parkinsonism Other   . Heart disease Other   . Heart disease Brother     Died during CABG  . Hypertension  Sister   . Aneurysm Sister     Brain  . Hypertension Father   . Aneurysm Father   . Stroke Father     Social History History  Substance Use Topics  . Smoking status: Never Smoker   . Smokeless tobacco: Never Used   Comment: Does not live with smokers  . Alcohol Use: No    Allergies  Allergen Reactions  . Alendronate Sodium   . Belladonna Alk-Phenobarbital     REACTION: (Maybe)  . Cefaclor   . Cephalexin   . Chlordiazepoxide     REACTION: (Maybe)  . Digoxin   . Hydrocodone-Acetaminophen     REACTION: Bad effect with other meds.  . Nitrofurantoin     REACTION: (Maybe)  . Oxcarbazepine   . Simvastatin     Myalgias, possible intolerance  . Streptomycin   . Sulfonamide Derivatives     REACTION: (Maybe)  . Tetracycline   . Fish Oil Rash    rash  . Omeprazole Rash    rash    Current Outpatient Prescriptions  Medication Sig Dispense Refill  . aspirin 81 MG tablet Take 81 mg by mouth daily.        . cholecalciferol (VITAMIN D) 1000 UNITS tablet Take 1,000 Units by mouth daily.        .   co-enzyme Q-10 30 MG capsule Take 100 mg by mouth daily.      . DULoxetine (CYMBALTA) 60 MG capsule Take 60 mg by mouth daily.       . folic acid (FOLVITE) 400 MCG tablet Take 600 mcg by mouth daily.      . gabapentin (NEURONTIN) 300 MG capsule Take 300 mg by mouth 3 (three) times daily.       . lamoTRIgine (LAMICTAL) 100 MG tablet Take 100 mg by mouth daily.      . OLANZapine (ZYPREXA) 5 MG tablet Take one half tablet by mouth at bedtime.        Review of Systems Review of Systems  Constitutional: Negative for fever, chills and unexpected weight change.  HENT: Negative for hearing loss, congestion, sore throat, trouble swallowing and voice change.   Eyes: Negative for visual disturbance.  Respiratory: Negative for cough and wheezing.   Cardiovascular: Negative for chest pain, palpitations and leg swelling.  Gastrointestinal: Negative for nausea, vomiting, abdominal pain, diarrhea,  constipation, blood in stool, abdominal distention and anal bleeding.  Genitourinary: Negative for hematuria, vaginal bleeding and difficulty urinating.  Musculoskeletal: Negative for arthralgias.  Skin: Negative for rash and wound.  Neurological: Negative for seizures, syncope and headaches.  Hematological: Negative for adenopathy. Does not bruise/bleed easily.  Psychiatric/Behavioral: Negative for confusion.    Blood pressure 134/84, pulse 83, temperature 98.2 F (36.8 C), temperature source Temporal, height 5' 1" (1.549 m), weight 174 lb 12.8 oz (79.289 kg), SpO2 92.00%.  Physical Exam Physical Exam  Vitals reviewed. Constitutional: She appears well-developed and well-nourished.  Cardiovascular: Normal rate, regular rhythm and normal heart sounds.   Pulmonary/Chest: Effort normal and breath sounds normal. She has no wheezes. She has no rales. Right breast exhibits no inverted nipple, no mass, no nipple discharge, no skin change and no tenderness. Left breast exhibits no inverted nipple, no mass, no nipple discharge, no skin change and no tenderness. Breasts are symmetrical.    Lymphadenopathy:    She has no cervical adenopathy.    Data Reviewed BILATERAL BREAST MRI WITH AND WITHOUT CONTRAST  Technique: Multiplanar, multisequence MR images of both breasts  were obtained prior to and following the intravenous administration  of 15ml of MultiHance. Three dimensional images were evaluated at  the independent DynaCad workstation.  Comparison: Recent and previous mammogram and ultrasound  examinations at [facility].  Findings: Mild background parenchymal enhancement in both breasts.  Small cyst in the anterior right breast.  Normal appearing glandular tissue in the subareolar region of the  left breast with no mass or other findings suspicious for  malignancy on the left.  There is clumped nodular enhancement in the central right breast,  consisting of three individual  masses with ill-defined margins  arranged in a linear fashion. This area measures 2.6 x 0.6 x 0.5  cm in maximum dimensions. This has predominately persistent and  plateau enhancement kinetics.  No additional masses or areas of enhancement suspicious for  malignancy in either breast. No abnormal appearing lymph nodes. A  small nonenhancing cyst is incidentally noted in the liver on the  right.  IMPRESSION:  1. 2.6 x 0.6 x 0.5 cm area of linear, clumped, nodular enhancement  in the central right breast. This is concerning for the  possibility of malignancy. Therefore, MR guided core needle biopsy  of this area is recommended.  2. No evidence of malignancy on the left.   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 We discussed that there is a small possibility there could be carcinoma associated with this. We discussed a right breast wire localization biopsy and the risks associated with that. We discussed specifically bleeding, infection, and further surgery for cancers identified. She has a fair bit of swelling around this area to be good to wait a couple of weeks and in addition her 62th wedding anniversary, and so we will plan on doing this in the third week of November.       Kenston Longton 06/17/2012, 4:49 PM    

## 2012-07-04 ENCOUNTER — Encounter (HOSPITAL_BASED_OUTPATIENT_CLINIC_OR_DEPARTMENT_OTHER): Payer: Self-pay | Admitting: *Deleted

## 2012-07-04 NOTE — Progress Notes (Signed)
To come in for [facility] orders

## 2012-07-04 NOTE — Progress Notes (Signed)
Pt was dx mild sleep apnea-she tried cpap-could not use-says she cannot remember where or who ordered it-says she does not snore now.

## 2012-07-08 ENCOUNTER — Encounter (HOSPITAL_BASED_OUTPATIENT_CLINIC_OR_DEPARTMENT_OTHER)
Admission: RE | Admit: 2012-07-08 | Discharge: 2012-07-08 | Disposition: A | Discharge status: Home / Self Care | Payer: Medicare Other | Source: Ambulatory Visit | Attending: General Surgery | Admitting: General Surgery

## 2012-07-08 DIAGNOSIS — M810 Age-related osteoporosis without current pathological fracture: Secondary | ICD-10-CM | POA: Diagnosis not present

## 2012-07-08 DIAGNOSIS — Z01812 Encounter for preprocedural laboratory examination: Secondary | ICD-10-CM | POA: Diagnosis not present

## 2012-07-08 DIAGNOSIS — Z79899 Other long term (current) drug therapy: Secondary | ICD-10-CM | POA: Diagnosis not present

## 2012-07-08 DIAGNOSIS — E785 Hyperlipidemia, unspecified: Secondary | ICD-10-CM | POA: Diagnosis not present

## 2012-07-08 DIAGNOSIS — G4733 Obstructive sleep apnea (adult) (pediatric): Secondary | ICD-10-CM | POA: Diagnosis not present

## 2012-07-08 DIAGNOSIS — N649 Disorder of breast, unspecified: Secondary | ICD-10-CM | POA: Diagnosis not present

## 2012-07-08 DIAGNOSIS — K219 Gastro-esophageal reflux disease without esophagitis: Secondary | ICD-10-CM | POA: Diagnosis not present

## 2012-07-08 DIAGNOSIS — IMO0001 Reserved for inherently not codable concepts without codable children: Secondary | ICD-10-CM | POA: Diagnosis not present

## 2012-07-08 DIAGNOSIS — Z7982 Long term (current) use of aspirin: Secondary | ICD-10-CM | POA: Diagnosis not present

## 2012-07-08 LAB — BASIC METABOLIC PANEL
CO2: 28 mEq/L (ref 19–32)
Chloride: 103 mEq/L (ref 96–112)
Creatinine, Ser: 0.69 mg/dL (ref 0.50–1.10)
Potassium: 3.4 mEq/L — ABNORMAL LOW (ref 3.5–5.1)
Sodium: 140 mEq/L (ref 135–145)

## 2012-07-08 LAB — CBC WITH DIFFERENTIAL/PLATELET
Eosinophils Relative: 2 % (ref 0–5)
HCT: 40.9 % (ref 36.0–46.0)
Hemoglobin: 13.9 g/dL (ref 12.0–15.0)
Lymphocytes Relative: 27 % (ref 12–46)
Lymphs Abs: 2.1 10*3/uL (ref 0.7–4.0)
MCV: 95.8 fL (ref 78.0–100.0)
Monocytes Absolute: 0.5 10*3/uL (ref 0.1–1.0)
Monocytes Relative: 6 % (ref 3–12)
Neutro Abs: 5.1 10*3/uL (ref 1.7–7.7)
RBC: 4.27 MIL/uL (ref 3.87–5.11)
WBC: 7.9 10*3/uL (ref 4.0–10.5)

## 2012-07-10 ENCOUNTER — Ambulatory Visit (HOSPITAL_BASED_OUTPATIENT_CLINIC_OR_DEPARTMENT_OTHER): Payer: Medicare Other | Admitting: Certified Registered Nurse Anesthetist

## 2012-07-10 ENCOUNTER — Ambulatory Visit (HOSPITAL_BASED_OUTPATIENT_CLINIC_OR_DEPARTMENT_OTHER)
Admission: RE | Admit: 2012-07-10 | Discharge: 2012-07-11 | Disposition: A | Discharge status: Home / Self Care | Payer: Medicare Other | Source: Ambulatory Visit | Attending: General Surgery | Admitting: General Surgery

## 2012-07-10 ENCOUNTER — Encounter (HOSPITAL_BASED_OUTPATIENT_CLINIC_OR_DEPARTMENT_OTHER)
Admission: RE | Disposition: A | Discharge status: Home / Self Care | Payer: Self-pay | Source: Ambulatory Visit | Attending: General Surgery

## 2012-07-10 ENCOUNTER — Encounter (HOSPITAL_BASED_OUTPATIENT_CLINIC_OR_DEPARTMENT_OTHER): Payer: Self-pay | Admitting: Certified Registered Nurse Anesthetist

## 2012-07-10 ENCOUNTER — Encounter (HOSPITAL_BASED_OUTPATIENT_CLINIC_OR_DEPARTMENT_OTHER): Payer: Self-pay

## 2012-07-10 DIAGNOSIS — N6019 Diffuse cystic mastopathy of unspecified breast: Secondary | ICD-10-CM | POA: Diagnosis not present

## 2012-07-10 DIAGNOSIS — Z0189 Encounter for other specified special examinations: Secondary | ICD-10-CM | POA: Diagnosis not present

## 2012-07-10 DIAGNOSIS — Z79899 Other long term (current) drug therapy: Secondary | ICD-10-CM | POA: Insufficient documentation

## 2012-07-10 DIAGNOSIS — R928 Other abnormal and inconclusive findings on diagnostic imaging of breast: Secondary | ICD-10-CM | POA: Diagnosis not present

## 2012-07-10 DIAGNOSIS — G4733 Obstructive sleep apnea (adult) (pediatric): Secondary | ICD-10-CM | POA: Insufficient documentation

## 2012-07-10 DIAGNOSIS — Z7982 Long term (current) use of aspirin: Secondary | ICD-10-CM | POA: Insufficient documentation

## 2012-07-10 DIAGNOSIS — K219 Gastro-esophageal reflux disease without esophagitis: Secondary | ICD-10-CM | POA: Insufficient documentation

## 2012-07-10 DIAGNOSIS — M810 Age-related osteoporosis without current pathological fracture: Secondary | ICD-10-CM | POA: Diagnosis not present

## 2012-07-10 DIAGNOSIS — N6489 Other specified disorders of breast: Secondary | ICD-10-CM | POA: Diagnosis not present

## 2012-07-10 DIAGNOSIS — N649 Disorder of breast, unspecified: Secondary | ICD-10-CM | POA: Insufficient documentation

## 2012-07-10 DIAGNOSIS — N6089 Other benign mammary dysplasias of unspecified breast: Secondary | ICD-10-CM | POA: Diagnosis not present

## 2012-07-10 DIAGNOSIS — Z01812 Encounter for preprocedural laboratory examination: Secondary | ICD-10-CM | POA: Insufficient documentation

## 2012-07-10 DIAGNOSIS — E785 Hyperlipidemia, unspecified: Secondary | ICD-10-CM | POA: Insufficient documentation

## 2012-07-10 DIAGNOSIS — IMO0001 Reserved for inherently not codable concepts without codable children: Secondary | ICD-10-CM | POA: Insufficient documentation

## 2012-07-10 HISTORY — PX: BREAST BIOPSY: SHX20

## 2012-07-10 SURGERY — BREAST BIOPSY WITH NEEDLE LOCALIZATION
Anesthesia: General | Site: Breast | Laterality: Right | Wound class: Clean

## 2012-07-10 MED ORDER — FENTANYL CITRATE 0.05 MG/ML IJ SOLN
INTRAMUSCULAR | Status: DC | PRN
  Administered 2012-07-10: 25 ug via INTRAVENOUS
  Administered 2012-07-10: 50 ug via INTRAVENOUS

## 2012-07-10 MED ORDER — ONDANSETRON HCL 4 MG/2ML IJ SOLN
INTRAMUSCULAR | Status: DC | PRN
  Administered 2012-07-10: 4 mg via INTRAVENOUS

## 2012-07-10 MED ORDER — LACTATED RINGERS IV SOLN
INTRAVENOUS | Status: DC
  Administered 2012-07-10 (×2): via INTRAVENOUS

## 2012-07-10 MED ORDER — DEXAMETHASONE SODIUM PHOSPHATE 4 MG/ML IJ SOLN
INTRAMUSCULAR | Status: DC | PRN
  Administered 2012-07-10: 10 mg via INTRAVENOUS

## 2012-07-10 MED ORDER — CEFAZOLIN SODIUM-DEXTROSE 2-3 GM-% IV SOLR: 2.0000 g | INTRAVENOUS | Status: DC

## 2012-07-10 MED ORDER — ACETAMINOPHEN 325 MG PO TABS: 650.0000 mg | ORAL_TABLET | Freq: Four times a day (QID) | ORAL | Status: DC | PRN

## 2012-07-10 MED ORDER — SODIUM CHLORIDE 0.9 % IV SOLN
INTRAVENOUS | Status: DC
  Administered 2012-07-10: 50 mL/h via INTRAVENOUS

## 2012-07-10 MED ORDER — OXYCODONE HCL 5 MG PO TABS
5.0000 mg | ORAL_TABLET | ORAL | Status: DC | PRN
  Administered 2012-07-10: 5 mg via ORAL

## 2012-07-10 MED ORDER — GABAPENTIN 300 MG PO CAPS
300.0000 mg | ORAL_CAPSULE | Freq: Three times a day (TID) | ORAL | Status: DC
  Administered 2012-07-10: 300 mg via ORAL

## 2012-07-10 MED ORDER — OXYCODONE HCL 5 MG/5ML PO SOLN: 5.0000 mg | Freq: Once | ORAL | Status: DC | PRN

## 2012-07-10 MED ORDER — OLANZAPINE 5 MG PO TABS
5.0000 mg | ORAL_TABLET | Freq: Every day | ORAL | Status: DC
  Administered 2012-07-10: 5 mg via ORAL

## 2012-07-10 MED ORDER — HYDROMORPHONE HCL PF 1 MG/ML IJ SOLN
0.2500 mg | INTRAMUSCULAR | Status: DC | PRN
Start: 2012-07-10 — End: 2012-07-10
  Administered 2012-07-10 (×3): 0.25 mg via INTRAVENOUS

## 2012-07-10 MED ORDER — MORPHINE SULFATE 2 MG/ML IJ SOLN: 1.0000 mg | INTRAMUSCULAR | Status: DC | PRN

## 2012-07-10 MED ORDER — LAMOTRIGINE 100 MG PO TABS: 100.0000 mg | ORAL_TABLET | Freq: Every day | ORAL | Status: DC

## 2012-07-10 MED ORDER — ONDANSETRON HCL 4 MG/2ML IJ SOLN: 4.0000 mg | Freq: Once | INTRAMUSCULAR | Status: DC | PRN

## 2012-07-10 MED ORDER — OXYCODONE HCL 5 MG PO TABS: 5.0000 mg | ORAL_TABLET | Freq: Once | ORAL | Status: DC | PRN

## 2012-07-10 MED ORDER — ONDANSETRON HCL 4 MG/2ML IJ SOLN: 4.0000 mg | Freq: Four times a day (QID) | INTRAMUSCULAR | Status: DC | PRN

## 2012-07-10 MED ORDER — LIDOCAINE HCL (CARDIAC) 20 MG/ML IV SOLN
INTRAVENOUS | Status: DC | PRN
  Administered 2012-07-10: 60 mg via INTRAVENOUS

## 2012-07-10 MED ORDER — BUPIVACAINE HCL (PF) 0.25 % IJ SOLN
INTRAMUSCULAR | Status: DC | PRN
  Administered 2012-07-10: 10 mL

## 2012-07-10 MED ORDER — ACETAMINOPHEN 650 MG RE SUPP: 650.0000 mg | Freq: Four times a day (QID) | RECTAL | Status: DC | PRN

## 2012-07-10 MED ORDER — DULOXETINE HCL 60 MG PO CPEP: 60.0000 mg | ORAL_CAPSULE | Freq: Every day | ORAL | Status: DC

## 2012-07-10 MED ORDER — PROPOFOL 10 MG/ML IV BOLUS
INTRAVENOUS | Status: DC | PRN
  Administered 2012-07-10: 150 mg via INTRAVENOUS

## 2012-07-10 SURGICAL SUPPLY — 53 items
APPLIER CLIP 9.375 MED OPEN (MISCELLANEOUS)
BENZOIN TINCTURE PRP APPL 2/3 (GAUZE/BANDAGES/DRESSINGS) ×2 IMPLANT
BINDER BREAST LRG (GAUZE/BANDAGES/DRESSINGS) IMPLANT
BINDER BREAST MEDIUM (GAUZE/BANDAGES/DRESSINGS) IMPLANT
BINDER BREAST XLRG (GAUZE/BANDAGES/DRESSINGS) IMPLANT
BINDER BREAST XXLRG (GAUZE/BANDAGES/DRESSINGS) IMPLANT
BLADE SURG 15 STRL LF DISP TIS (BLADE) ×1 IMPLANT
BLADE SURG 15 STRL SS (BLADE) ×2
CANISTER SUCTION 1200CC (MISCELLANEOUS) IMPLANT
CHLORAPREP W/TINT 26ML (MISCELLANEOUS) ×2 IMPLANT
CLIP APPLIE 9.375 MED OPEN (MISCELLANEOUS) IMPLANT
CLOTH BEACON ORANGE TIMEOUT ST (SAFETY) ×2 IMPLANT
COVER MAYO STAND STRL (DRAPES) ×2 IMPLANT
COVER TABLE BACK 60X90 (DRAPES) ×2 IMPLANT
DECANTER SPIKE VIAL GLASS SM (MISCELLANEOUS) IMPLANT
DERMABOND ADVANCED (GAUZE/BANDAGES/DRESSINGS)
DERMABOND ADVANCED .7 DNX12 (GAUZE/BANDAGES/DRESSINGS) IMPLANT
DEVICE DUBIN W/COMP PLATE 8390 (MISCELLANEOUS) IMPLANT
DRAPE PED LAPAROTOMY (DRAPES) ×2 IMPLANT
DRSG TEGADERM 4X4.75 (GAUZE/BANDAGES/DRESSINGS) ×2 IMPLANT
ELECT COATED BLADE 2.86 ST (ELECTRODE) ×2 IMPLANT
ELECT REM PT RETURN 9FT ADLT (ELECTROSURGICAL) ×2
ELECTRODE REM PT RTRN 9FT ADLT (ELECTROSURGICAL) ×1 IMPLANT
GAUZE SPONGE 4X4 12PLY STRL LF (GAUZE/BANDAGES/DRESSINGS) ×2 IMPLANT
GLOVE BIO SURGEON STRL SZ7 (GLOVE) ×2 IMPLANT
GLOVE BIO SURGEON STRL SZ7.5 (GLOVE) ×2 IMPLANT
GLOVE BIOGEL PI IND STRL 7.5 (GLOVE) ×1 IMPLANT
GLOVE BIOGEL PI IND STRL 8 (GLOVE) ×1 IMPLANT
GLOVE BIOGEL PI INDICATOR 7.5 (GLOVE) ×1
GLOVE BIOGEL PI INDICATOR 8 (GLOVE) ×1
GOWN PREVENTION PLUS XLARGE (GOWN DISPOSABLE) ×2 IMPLANT
NEEDLE HYPO 25X1 1.5 SAFETY (NEEDLE) ×2 IMPLANT
NS IRRIG 1000ML POUR BTL (IV SOLUTION) IMPLANT
PACK BASIN DAY SURGERY FS (CUSTOM PROCEDURE TRAY) ×2 IMPLANT
PENCIL BUTTON HOLSTER BLD 10FT (ELECTRODE) ×2 IMPLANT
SLEEVE SCD COMPRESS KNEE MED (MISCELLANEOUS) ×2 IMPLANT
SPONGE LAP 4X18 X RAY DECT (DISPOSABLE) ×2 IMPLANT
STRIP CLOSURE SKIN 1/2X4 (GAUZE/BANDAGES/DRESSINGS) ×2 IMPLANT
SUT MNCRL AB 4-0 PS2 18 (SUTURE) IMPLANT
SUT MON AB 5-0 PS2 18 (SUTURE) IMPLANT
SUT SILK 2 0 SH (SUTURE) ×2 IMPLANT
SUT VIC AB 2-0 SH 27 (SUTURE) ×2
SUT VIC AB 2-0 SH 27XBRD (SUTURE) ×1 IMPLANT
SUT VIC AB 3-0 SH 27 (SUTURE) ×2
SUT VIC AB 3-0 SH 27X BRD (SUTURE) ×1 IMPLANT
SUT VIC AB 5-0 PS2 18 (SUTURE) IMPLANT
SUT VICRYL AB 3 0 TIES (SUTURE) IMPLANT
SYR CONTROL 10ML LL (SYRINGE) ×2 IMPLANT
TOWEL OR 17X24 6PK STRL BLUE (TOWEL DISPOSABLE) ×2 IMPLANT
TOWEL OR NON WOVEN STRL DISP B (DISPOSABLE) ×2 IMPLANT
TUBE CONNECTING 20X1/4 (TUBING) IMPLANT
WATER STERILE IRR 1000ML POUR (IV SOLUTION) ×2 IMPLANT
YANKAUER SUCT BULB TIP NO VENT (SUCTIONS) IMPLANT

## 2012-07-10 NOTE — Transfer of Care (Signed)
Immediate Anesthesia Transfer of Care Note  Patient: Robin Juarez  Procedure(s) Performed: Procedure(s) (LRB) with comments: BREAST BIOPSY WITH NEEDLE LOCALIZATION (Right)  Patient Location: PACU  Anesthesia Type:General  Level of Consciousness: awake, alert , oriented and patient cooperative  Airway & Oxygen Therapy: Patient Spontanous Breathing and Patient connected to face mask oxygen  Post-op Assessment: Report given to PACU RN and Post -op Vital signs reviewed and stable  Post vital signs: Reviewed and stable  Complications: No apparent anesthesia complications

## 2012-07-10 NOTE — Interval H&P Note (Signed)
History and Physical Interval Note:  07/10/2012 10:54 AM  [name]  has presented today for surgery, with the diagnosis of right breast lesion  The various methods of treatment have been discussed with the patient and family. After consideration of risks, benefits and other options for treatment, the patient has consented to  Procedure(s) (LRB) with comments: BREAST BIOPSY WITH NEEDLE LOCALIZATION (Right) as a surgical intervention .  The patient's history has been reviewed, patient examined, no change in status, stable for surgery.  I have reviewed the patient's chart and labs.  Questions were answered to the patient's satisfaction.     Robin Juarez

## 2012-07-10 NOTE — Anesthesia Preprocedure Evaluation (Signed)
Anesthesia Evaluation  Patient identified by MRN, date of birth, ID band Patient awake    Reviewed: Allergy & Precautions, H&P , NPO status , Patient's Chart, lab work & pertinent test results  Airway Mallampati: II TM Distance: >3 FB Neck ROM: Full    Dental  (+) Teeth Intact and Dental Advisory Given   Pulmonary sleep apnea ,  Does not use CPAP; stopped on her own.  breath sounds clear to auscultation        Cardiovascular Rhythm:Regular Rate:Normal     Neuro/Psych    GI/Hepatic   Endo/Other  Morbid obesity  Renal/GU      Musculoskeletal   Abdominal   Peds  Hematology   Anesthesia Other Findings   Reproductive/Obstetrics                           Anesthesia Physical Anesthesia Plan  ASA: III  Anesthesia Plan: General   Post-op Pain Management:    Induction: Intravenous  Airway Management Planned:   Additional Equipment:   Intra-op Plan:   Post-operative Plan: Extubation in OR  Informed Consent: I have reviewed the patients History and Physical, chart, labs and discussed the procedure including the risks, benefits and alternatives for the proposed anesthesia with the patient or authorized representative who has indicated his/her understanding and acceptance.   Dental advisory given  Plan Discussed with: CRNA, Anesthesiologist and Surgeon  Anesthesia Plan Comments:         Anesthesia Quick Evaluation

## 2012-07-10 NOTE — Anesthesia Postprocedure Evaluation (Signed)
  Anesthesia Post-op Note  Patient: Robin Juarez  Procedure(s) Performed: Procedure(s) (LRB) with comments: BREAST BIOPSY WITH NEEDLE LOCALIZATION (Right)  Patient Location: PACU  Anesthesia Type:General  Level of Consciousness: awake, alert  and oriented  Airway and Oxygen Therapy: Patient Spontanous Breathing and Patient connected to face mask oxygen  Post-op Pain: mild  Post-op Assessment: Post-op Vital signs reviewed  Post-op Vital Signs: Reviewed  Complications: No apparent anesthesia complications

## 2012-07-10 NOTE — H&P (View-Only) (Signed)
Patient ID: Robin Juarez, female   DOB: 1931/05/28, 78 y.o.   MRN: [id number]  Chief Complaint  Patient presents with  . Pre-op Exam    eval br hyperlasia    HPI Robin Juarez is a 78 y.o. female.  Referred by Dr. Raechel Ache HPI This is a 78 year old female who has a history of a benign left breast biopsy in the past who presents after undergoing a screening mammogram that showed a left breast abnormality. This was followed up by an MRI which showed no evidence of malignancy on the left side but did show a 2.6 x 0.6 x 0.5 cm area of linear clumped nodular enhancement. 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 She comes in today to discuss her options following this biopsy with no complaints referable to either breast. Past Medical History  Diagnosis Date  . Hyperlipidemia   . GERD (gastroesophageal reflux disease)   . Osteoporosis   . Arthritis   . OSA (obstructive sleep apnea)   . Fibromyalgia   . UTI (lower urinary tract infection)   . Urinary incontinence   . Depression 1993, 2000  . Blood transfusion without reported diagnosis     Past Surgical History  Procedure Date  . Cardiovascular stress test 2009    Normal, Dr. Mayford Knife  . Breast biopsy 1995  . Abdominal hysterectomy 1976  . Back fusions 1977    missing vertebra  . Childbirth [phone]  . Ovarian cyst removal 1964  . Appendectomy 1964  . Dilatation and currettage 1964  . Electroconvulsive therapy for depression 1997  . Cataract extraction, bilateral   . Eye surgery 2010    both eyes    Family History  Problem Relation Age of Onset  . Alcohol abuse Other     and addiction  . Arthritis Other   . Hyperlipidemia Other   . Hypertension Other   . Kidney disease Other   . Stroke Other   . Sudden death Other   . Parkinsonism Other   . Heart disease Other   . Heart disease Brother     Died during CABG  . Hypertension  Sister   . Aneurysm Sister     Brain  . Hypertension Father   . Aneurysm Father   . Stroke Father     Social History History  Substance Use Topics  . Smoking status: Never Smoker   . Smokeless tobacco: Never Used   Comment: Does not live with smokers  . Alcohol Use: No    Allergies  Allergen Reactions  . Alendronate Sodium   . Belladonna Alk-Phenobarbital     REACTION: (Maybe)  . Cefaclor   . Cephalexin   . Chlordiazepoxide     REACTION: (Maybe)  . Digoxin   . Hydrocodone-Acetaminophen     REACTION: Bad effect with other meds.  . Nitrofurantoin     REACTION: (Maybe)  . Oxcarbazepine   . Simvastatin     Myalgias, possible intolerance  . Streptomycin   . Sulfonamide Derivatives     REACTION: (Maybe)  . Tetracycline   . Fish Oil Rash    rash  . Omeprazole Rash    rash    Current Outpatient Prescriptions  Medication Sig Dispense Refill  . aspirin 81 MG tablet Take 81 mg by mouth daily.        . cholecalciferol (VITAMIN D) 1000 UNITS tablet Take 1,000 Units by mouth daily.        [name]  co-enzyme Q-10 30 MG capsule Take 100 mg by mouth daily.      . DULoxetine (CYMBALTA) 60 MG capsule Take 60 mg by mouth daily.       . folic acid (FOLVITE) 400 MCG tablet Take 600 mcg by mouth daily.      [name] gabapentin (NEURONTIN) 300 MG capsule Take 300 mg by mouth 3 (three) times daily.       [name] lamoTRIgine (LAMICTAL) 100 MG tablet Take 100 mg by mouth daily.      [name] OLANZapine (ZYPREXA) 5 MG tablet Take one half tablet by mouth at bedtime.        Review of Systems Review of Systems  Constitutional: Negative for fever, chills and unexpected weight change.  HENT: Negative for hearing loss, congestion, sore throat, trouble swallowing and voice change.   Eyes: Negative for visual disturbance.  Respiratory: Negative for cough and wheezing.   Cardiovascular: Negative for chest pain, palpitations and leg swelling.  Gastrointestinal: Negative for nausea, vomiting, abdominal pain, diarrhea,  constipation, blood in stool, abdominal distention and anal bleeding.  Genitourinary: Negative for hematuria, vaginal bleeding and difficulty urinating.  Musculoskeletal: Negative for arthralgias.  Skin: Negative for rash and wound.  Neurological: Negative for seizures, syncope and headaches.  Hematological: Negative for adenopathy. Does not bruise/bleed easily.  Psychiatric/Behavioral: Negative for confusion.    Blood pressure 134/84, pulse 83, temperature 98.2 F (36.8 C), temperature source Temporal, height 5\' 1"  (1.549 m), weight 174 lb 12.8 oz (79.289 kg), SpO2 92.00%.  Physical Exam Physical Exam  Vitals reviewed. Constitutional: She appears well-developed and well-nourished.  Cardiovascular: Normal rate, regular rhythm and normal heart sounds.   Pulmonary/Chest: Effort normal and breath sounds normal. She has no wheezes. She has no rales. Right breast exhibits no inverted nipple, no mass, no nipple discharge, no skin change and no tenderness. Left breast exhibits no inverted nipple, no mass, no nipple discharge, no skin change and no tenderness. Breasts are symmetrical.    Lymphadenopathy:    She has no cervical adenopathy.    Data Reviewed BILATERAL BREAST MRI WITH AND WITHOUT CONTRAST  Technique: Multiplanar, multisequence MR images of both breasts  were obtained prior to and following the intravenous administration  of 15ml of MultiHance. Three dimensional images were evaluated at  the independent DynaCad workstation.  Comparison: Recent and previous mammogram and ultrasound  examinations at [facility].  Findings: Mild background parenchymal enhancement in both breasts.  Small cyst in the anterior right breast.  Normal appearing glandular tissue in the subareolar region of the  left breast with no mass or other findings suspicious for  malignancy on the left.  There is clumped nodular enhancement in the central right breast,  consisting of three individual  masses with ill-defined margins  arranged in a linear fashion. This area measures 2.6 x 0.6 x 0.5  cm in maximum dimensions. This has predominately persistent and  plateau enhancement kinetics.  No additional masses or areas of enhancement suspicious for  malignancy in either breast. No abnormal appearing lymph nodes. A  small nonenhancing cyst is incidentally noted in the liver on the  right.  IMPRESSION:  1. 2.6 x 0.6 x 0.5 cm area of linear, clumped, nodular enhancement  in the central right breast. This is concerning for the  possibility of malignancy. Therefore, MR guided core needle biopsy  of this area is recommended.  2. No evidence of malignancy on the left.   Assessment    Right breast complex sclerosing  lesion on core biopsy    Plan    We discussed the indications for a right breast wire localization biopsy. We discussed that there is a small possibility there could be carcinoma associated with this. We discussed a right breast wire localization biopsy and the risks associated with that. We discussed specifically bleeding, infection, and further surgery for cancers identified. She has a fair bit of swelling around this area to be good to wait a couple of weeks and in addition her 62th wedding anniversary, and so we will plan on doing this in the third week of November.       Shandricka Monroy 06/17/2012, 4:49 PM

## 2012-07-10 NOTE — Op Note (Signed)
Preoperative diagnosis:right breast mammographic abnormality with core biopsy showing complex sclerosing lesion Postoperative diagnosis: Same as above Procedure: right breast wire localization excisional biopsy Surgeon: Dr. Harden Mo Anesthesia: Gen. With LMA Estimated blood loss: Minimal Specimens: Right breast tissue marked short stitch superior, long stitch lateral, double stitch deep Consultations: None Sponge needle count correct x2 at end of operation Disposition to recovery stable  Indications: This a 78 year old female who underwent a mammogram as well as an MR showing an abnormality in the right breast. This underwent a core biopsy showing a complex sclerosing lesion. She was then referred for evaluation. We discussed an excisional biopsy of this lesion due to the chance of adenocarcinoma located in this as well. We discussed the risks and benefits of a wire localized excisional biopsy. All plan on keeping her overnight after the surgery due to her sleep apnea.  Procedure: After informed consent was obtained the patient was first to get the breast center where she had a wire placed near the clip. I had these mammograms in the operating room. She had sequential compression devices on her legs. She was placed under general anesthesia with an LMA. Her right breast was prepped and draped in the standard sterile surgical fashion. A surgical timeout was then performed.  I made a radial incision overlying the wire as well as the mass. I then used cautery to excise the wire, mass, and surrounding tissue. A Faxitron mammogram was taken in the operating room confirming removal of the clip, wire, and mass. This was also confirmed by radiology. I then obtained hemostasis. I closed her breast tissue with 2-0 Vicryl. I closed the dermis with 3-0 Vicryl and the skin with 4-0 Monocryl. I infiltrated Marcaine throughout the wound. I placed Steri-Strips and a sterile dressing overlying this. She tolerated  this well was extubated and transferred to the recovery room in stable condition.

## 2012-07-10 NOTE — Anesthesia Procedure Notes (Signed)
Procedure Name: LMA Insertion Date/Time: 07/10/2012 11:06 AM Performed by: Teighan Aubert D Pre-anesthesia Checklist: Patient identified, Emergency Drugs available, Suction available and Patient being monitored Patient Re-evaluated:Patient Re-evaluated prior to inductionOxygen Delivery Method: Circle System Utilized Preoxygenation: Pre-oxygenation with 100% oxygen Intubation Type: IV induction Ventilation: Mask ventilation without difficulty LMA: LMA inserted LMA Size: 4.0 Number of attempts: 1 Airway Equipment and Method: bite block Placement Confirmation: positive ETCO2 Tube secured with: Tape Dental Injury: Teeth and Oropharynx as per pre-operative assessment

## 2012-07-11 ENCOUNTER — Encounter (HOSPITAL_BASED_OUTPATIENT_CLINIC_OR_DEPARTMENT_OTHER): Payer: Self-pay | Admitting: General Surgery

## 2012-07-11 MED ORDER — OXYCODONE-ACETAMINOPHEN 10-325 MG PO TABS: 1.0000 | ORAL_TABLET | Freq: Four times a day (QID) | ORAL | Status: DC | PRN

## 2012-07-12 ENCOUNTER — Telehealth (INDEPENDENT_AMBULATORY_CARE_PROVIDER_SITE_OTHER): Payer: Self-pay | Admitting: General Surgery

## 2012-07-12 NOTE — Telephone Encounter (Signed)
Spoke with pt and informed her that her surgical pathology came back benign. °

## 2012-07-14 ENCOUNTER — Other Ambulatory Visit: Payer: Self-pay | Admitting: Family Medicine

## 2012-07-14 DIAGNOSIS — G4733 Obstructive sleep apnea (adult) (pediatric): Secondary | ICD-10-CM

## 2012-08-01 ENCOUNTER — Ambulatory Visit (INDEPENDENT_AMBULATORY_CARE_PROVIDER_SITE_OTHER): Payer: Medicare Other | Admitting: Pulmonary Disease

## 2012-08-01 ENCOUNTER — Encounter: Payer: Self-pay | Admitting: Pulmonary Disease

## 2012-08-01 VITALS — BP 148/96 | HR 86 | Temp 98.6°F | Ht 61.0 in | Wt 177.2 lb

## 2012-08-01 DIAGNOSIS — G4733 Obstructive sleep apnea (adult) (pediatric): Secondary | ICD-10-CM

## 2012-08-01 NOTE — Progress Notes (Deleted)
  Subjective:    Patient ID: Robin Juarez, female    DOB: 03-03-1931, 78 y.o.   MRN: [id number]  HPI    Review of Systems  Constitutional: Negative for appetite change and unexpected weight change.  HENT: Negative for ear pain, congestion, sore throat, sneezing, trouble swallowing and dental problem.   Respiratory: Negative for cough and shortness of breath.   Cardiovascular: Negative for chest pain, palpitations and leg swelling.  Gastrointestinal: Negative for nausea and abdominal pain.  Musculoskeletal: Negative for joint swelling.  Skin: Negative for rash.  Neurological: Negative for headaches.  Psychiatric/Behavioral: Positive for dysphoric mood. The patient is not nervous/anxious.        Objective:   Physical Exam        Assessment & Plan:

## 2012-08-01 NOTE — Patient Instructions (Signed)
Will arrange for sleep study Will call to arrange for follow up after sleep study reviewed 

## 2012-08-01 NOTE — Progress Notes (Signed)
Chief Complaint  Patient presents with  . Advice Only    had sleep study 2010. pt has used her cpap 3 times in the past 3 weeks after her breast surgery. pt stopped using cpap originally cb she felt like she did not need it    History of Present Illness: Robin Juarez is a 78 y.o. female for evaluation of sleep apnea.  She has a history of sleep apnea.  She was started on CPAP in 2010.  She wasn't sure what benefit she was getting from using CPAP, and as a result stop using CPAP.    She had surgery recently.  She was told by the doctor in the hospital that she should continue using CPAP, and pulmonary/sleep evaluation was requested.  She goes to bed at 10 pm.  She falls asleep quickly.  She wake up several times to use the bathroom.  She gets out of bed at 7 am.  She feels okay in the morning.  She denies morning headache.  She is not using anything to help her stay awake.  Some of her anti-depressant medications also help her sleep.    The patient denies sleep walking, sleep talking, bruxism, or nightmares.  There is no history of restless legs.  The patient denies sleep hallucinations, sleep paralysis, or cataplexy.  She is not sure if she still snores.  She has lost about 5 lbs recently.  Her Epworth score is 5 out of 24.   Tests: PSG 10/27/08>>AHI 15.4 CPAP titration 12/08/08>>CPAP 7 cm H2O  Past Medical History  Diagnosis Date  . Hyperlipidemia   . GERD (gastroesophageal reflux disease)   . Osteoporosis   . Arthritis   . Fibromyalgia   . UTI (lower urinary tract infection)   . Urinary incontinence   . Depression 1993, 2000  . Blood transfusion without reported diagnosis   . OSA (obstructive sleep apnea)     dx 2010-had a cpap-does not use-    Past Surgical History  Procedure Date  . Cardiovascular stress test 2009    Normal, Dr. Mayford Knife  . Breast biopsy 1995    x 2  . Abdominal hysterectomy 1976  . Back fusions 1977    missing vertebra  . Childbirth  [phone]  . Ovarian cyst removal 1964  . Appendectomy 1964  . Dilatation and currettage 1964  . Cataract extraction, bilateral   . Breast biopsy 07/10/2012    Procedure: BREAST BIOPSY WITH NEEDLE LOCALIZATION;  Surgeon: Emelia Loron, MD;  Location: [facility];  Service: General;  Laterality: Right;    Current Outpatient Prescriptions on File Prior to Visit  Medication Sig Dispense Refill  . aspirin 81 MG tablet Take 81 mg by mouth daily.        . cholecalciferol (VITAMIN D) 1000 UNITS tablet Take 1,000 Units by mouth daily.        [name] co-enzyme Q-10 30 MG capsule Take 100 mg by mouth daily.      . DULoxetine (CYMBALTA) 60 MG capsule Take 60 mg by mouth daily.       . folic acid (FOLVITE) 400 MCG tablet Take 600 mcg by mouth daily.      [name] gabapentin (NEURONTIN) 300 MG capsule Take 300 mg by mouth 3 (three) times daily.       [name] lamoTRIgine (LAMICTAL) 100 MG tablet Take 100 mg by mouth daily.      [name] OLANZapine (ZYPREXA) 5 MG tablet Take one half tablet by mouth at bedtime.  Allergies  Allergen Reactions  . Alendronate Sodium     cramps  . Belladonna Alk-Phenobarbital     REACTION: (Maybe)  . Cefaclor     Felt bad  . Cephalexin     Felt bad  . Chlordiazepoxide     REACTION: (Maybe)  . Digoxin   . Hydrocodone-Acetaminophen     REACTION: Bad effect with other meds.  . Nitrofurantoin     Sharp pain back of head  . Oxcarbazepine   . Simvastatin     Myalgias, possible intolerance  . Streptomycin     Lips itched  . Sulfonamide Derivatives     REACTION: (Maybe)  . Tetracycline     itching  . Fish Oil Rash    rash  . Omeprazole Rash    rash    Family History  Problem Relation Age of Onset  . Hyperlipidemia Brother   . Arthritis Mother   . Hyperlipidemia Sister   . Hypertension Brother   . Kidney disease Maternal Grandmother   . Stroke Father   . Parkinsonism Sister   . Heart disease Brother     Died during CABG  . Hypertension  Sister   . Aneurysm Sister     Brain  . Hypertension Father     History  Substance Use Topics  . Smoking status: Never Smoker   . Smokeless tobacco: Never Used  . Alcohol Use: No    Review of Systems  Constitutional: Negative for appetite change and unexpected weight change.  HENT: Negative for ear pain, congestion, sore throat, sneezing, trouble swallowing and dental problem.   Respiratory: Negative for cough and shortness of breath.   Cardiovascular: Negative for chest pain, palpitations and leg swelling.  Gastrointestinal: Negative for nausea and abdominal pain.  Musculoskeletal: Negative for joint swelling.  Skin: Negative for rash.  Neurological: Negative for headaches.  Psychiatric/Behavioral: Positive for dysphoric mood. The patient is not nervous/anxious.    Physical Exam: Filed Vitals:   08/01/12 1553  BP: 148/96  Pulse: 86  Temp: 98.6 F (37 C)  TempSrc: Oral  Height: 5\' 1"  (1.549 m)  Weight: 177 lb 3.2 oz (80.377 kg)  SpO2: 92%    Wt Readings from Last 3 Encounters:  08/01/12 177 lb 3.2 oz (80.377 kg)  07/10/12 174 lb (78.926 kg)  07/10/12 174 lb (78.926 kg)    Body mass index is 33.48 kg/(m^2).   General - No distress ENT - No sinus tenderness, no oral exudate, MP 3, enlarged tongue with scalloped border, +2 tonsil, no LAN, no thyromegaly, TM clear, pupils equal/reactive Cardiac - s1s2 regular, no murmur, pulses symmetric Chest - No wheeze/rales/dullness, good air entry, normal respiratory excursion Back - No focal tenderness Abd - Soft, non-tender, no organomegaly, + bowel sounds Ext - No edema Neuro - Normal strength, cranial nerves intact Skin - No rashes Psych - Normal mood, and behavior.   Assessment/Plan:  Robin Helling, MD [place] [facility]Sleep Pager:  [phone] 08/01/2012, 3:59 PM

## 2012-08-06 ENCOUNTER — Encounter (INDEPENDENT_AMBULATORY_CARE_PROVIDER_SITE_OTHER): Payer: Self-pay | Admitting: General Surgery

## 2012-08-06 ENCOUNTER — Ambulatory Visit (INDEPENDENT_AMBULATORY_CARE_PROVIDER_SITE_OTHER): Payer: Medicare Other | Admitting: General Surgery

## 2012-08-06 VITALS — BP 128/82 | HR 78 | Temp 98.2°F | Resp 12 | Ht 61.0 in | Wt 177.4 lb

## 2012-08-06 DIAGNOSIS — F329 Major depressive disorder, single episode, unspecified: Secondary | ICD-10-CM | POA: Diagnosis not present

## 2012-08-06 DIAGNOSIS — F068 Other specified mental disorders due to known physiological condition: Secondary | ICD-10-CM | POA: Diagnosis not present

## 2012-08-06 DIAGNOSIS — Z09 Encounter for follow-up examination after completed treatment for conditions other than malignant neoplasm: Secondary | ICD-10-CM

## 2012-08-06 DIAGNOSIS — F3289 Other specified depressive episodes: Secondary | ICD-10-CM | POA: Diagnosis not present

## 2012-08-06 NOTE — Progress Notes (Signed)
Subjective:     Patient ID: ROLAND PRINE, female   DOB: Jun 13, 1931, 78 y.o.   MRN: [id number]  HPI This is a 78 year old female who presented with an abnormal MRI as well as a biopsy showing a complex sclerosing lesion. She recently has undergone a right breast wire localized biopsy of this region. Her pathology from the shows fibrocystic changes with usual ductal hyperplasia and no evidence of any malignancy. She returns today doing well without any complaints referable to that breast at all.  Review of Systems     Objective:   Physical Exam Well-healing right breast incision without any evidence of infection    Assessment:     Status post right breast wire localized biopsy for complex sclerosing lesion on core that is negative on excision    Plan:     She is doing well. I will plan on seeing her back as needed. We discussed her pathology and I provided her a copy today. We discussed normal breast cancer screening methods.

## 2012-08-06 NOTE — Patient Instructions (Signed)
bBreast Self-Exam A self breast exam may help you find changes or problems while they are still small. Do a breast self-exam:  Every month.  One week after your period (menstrual period).  On the first day of each month if you do not have periods anymore. Look for any:  Change in breast color, size, or shape.  Dimples in your breast.  Changes in your nipples or skin.  Dry skin on your breasts or nipples.  Watery or bloody discharge from your nipples.  Feel for:  Lumps.  Thick, hard places.  Any other changes. HOME CARE There are 3 ways to do the breast self-exam: In front of a mirror.  Lift your arms over your head and turn side to side.  Put your hands on your hips and lean down, then turn from side to side.  Bend forward and turn from side to side. In the shower.  With soapy hands, check both breasts. Then check above and below your collarbone and your armpits.  Feel above and below your collarbone down to under your breast, and from the center of your chest to the outer edge of the armpit. Check for any lumps or hard spots.  Using the tips of your middle three fingers check your whole breast by pressing your hand over your breast in a circle or in an up and down motion. Lying down.  Lie flat on your bed.  Put a small pillow under the breast you are going to check. On that same side, put your hand behind your head.  With your other hand, use the 3 middle fingers to feel the breast.  Move your fingers in a circle around the breast. Press firmly over all parts of the breast to feel for any lumps. GET HELP RIGHT AWAY IF: You find any changes in your breasts so they can be checked. Document Released: 01/24/2008 Document Revised: 10/30/2011 Document Reviewed: 11/25/2008 [facility] Patient Information 2013 [place], [place].

## 2012-08-07 DIAGNOSIS — G4733 Obstructive sleep apnea (adult) (pediatric): Secondary | ICD-10-CM | POA: Insufficient documentation

## 2012-08-07 NOTE — Assessment & Plan Note (Signed)
She has previous history of sleep apnea.  She has history of depression and fibromyalgia.  During recent surgical procedure there was concern about her respiratory pattern after getting sedative medication.  I am concerned she could still have sleep apnea.  I have explained how sleep apnea can affect the patient's health.  Driving precautions and importance of weight loss were discussed.  Treatment options for sleep apnea were reviewed.  To further assess will arrange for in lab sleep study.

## 2012-08-13 ENCOUNTER — Encounter (INDEPENDENT_AMBULATORY_CARE_PROVIDER_SITE_OTHER): Payer: Self-pay

## 2012-08-20 ENCOUNTER — Encounter: Payer: Self-pay | Admitting: Family Medicine

## 2012-08-20 ENCOUNTER — Ambulatory Visit (INDEPENDENT_AMBULATORY_CARE_PROVIDER_SITE_OTHER): Payer: Medicare Other | Admitting: Family Medicine

## 2012-08-20 VITALS — BP 128/82 | HR 72 | Temp 98.3°F | Wt 176.2 lb

## 2012-08-20 DIAGNOSIS — R209 Unspecified disturbances of skin sensation: Secondary | ICD-10-CM

## 2012-08-20 DIAGNOSIS — R251 Tremor, unspecified: Secondary | ICD-10-CM

## 2012-08-20 DIAGNOSIS — R259 Unspecified abnormal involuntary movements: Secondary | ICD-10-CM

## 2012-08-20 DIAGNOSIS — R202 Paresthesia of skin: Secondary | ICD-10-CM

## 2012-08-20 LAB — HEPATIC FUNCTION PANEL
ALT: 20 U/L (ref 0–35)
Bilirubin, Direct: 0 mg/dL (ref 0.0–0.3)
Total Bilirubin: 0.5 mg/dL (ref 0.3–1.2)

## 2012-08-20 LAB — TSH: TSH: 0.98 u[IU]/mL (ref 0.35–5.50)

## 2012-08-20 LAB — VITAMIN B12: Vitamin B-12: 398 pg/mL (ref 211–911)

## 2012-08-20 NOTE — Patient Instructions (Addendum)
Go to the lab on the way out.  We'll contact you with your lab report.  Don't change your meds for now.  I'll talk to Dr. Wynonia Lawman about the tremor.  If you labs are normal and if the numbness in your feet doesn't improve after the CPAP alterations, then let me know.   Take care.

## 2012-08-20 NOTE — Progress Notes (Signed)
L arm and L hand will occ shake.  Going on for about 2-3 years.  Gradually getting worse.  Intermittent, worse with certain positions, usually noted in the AM.  Not noted on R or the legs.  Strength is still normal.  R handed.  Not present during the exam initially.  Then noted as a pronation and supination tremor.  On zyprexa.  Sister had h/o parkinsons.    Leg symptoms noted in AM- B lower legs (anterior > posterior) numbness. No weakness.  No foot drop. No clear cause.  Incidentally, she was prev on CPAP and has stopped that until recently.  She has f/u pending with pulmonary for repeat OSA testing/titration.    Meds, vitals, and allergies reviewed.   ROS: See HPI.  Otherwise, noncontributory.  Nad, smiles, pleasant in conversation ncat Mmm rrr ctab abd soft, not ttp CN 2-12 wnl B, S/S/DTR wnl x4 except for faint dec in sensation to monofilament in BLE, sensation to light touch wnl in BLE.  Faint resting tremor intermittently noted on L hand but this is intermittent.  No cogwheeling.

## 2012-08-21 DIAGNOSIS — R202 Paresthesia of skin: Secondary | ICD-10-CM | POA: Insufficient documentation

## 2012-08-21 DIAGNOSIS — R251 Tremor, unspecified: Secondary | ICD-10-CM | POA: Insufficient documentation

## 2012-08-21 NOTE — Assessment & Plan Note (Signed)
Unclear source, labs unremarkable.  This is mainly noted in AM and I wonder if this could be positional (ie during sleep).  She'll notify me if the sx continue after she has the CPAP alterations made. She has normal DP pulses and this doesn't appear to be vascular.  >25 min spent with face to face with patient, >50% counseling and/or coordinating care

## 2012-08-21 NOTE — Assessment & Plan Note (Addendum)
She doesn't consume caffeine.  Sister with parkinsons, but this would atypical given the very slow onset, ie over the last 2-3 years.  Minimal sx now.  I'd like to discuss with psych given her meds.  Will be in touch with patient after I d/w Dr. Wynonia Lawman.  Pt agrees.

## 2012-08-25 ENCOUNTER — Other Ambulatory Visit: Payer: Self-pay | Admitting: Family Medicine

## 2012-08-25 NOTE — Telephone Encounter (Signed)
Cal pt.  I talked with Dr. Wynonia Lawman about the tremor.  I don't think this is parkinsonian and he agrees.  I wouldn't change her current meds, Dr. Wynonia Lawman agrees.  It would be reasonable to try a low dose of propranolol for about 1 month.  If lightheaded, stop the medicine and notify us.  Otherwise, notify me about the tremor in about 3-4 weeks.  Thanks. If she agrees, please call in the med listed.

## 2012-08-26 NOTE — Telephone Encounter (Signed)
Patient says the tremor really doesn't bother her and she would rather not go on another medication.  Are you okay with that?

## 2012-08-27 NOTE — Telephone Encounter (Signed)
Note forwarded to Dr. Wynonia Lawman.

## 2012-08-27 NOTE — Telephone Encounter (Signed)
This is reasonable. If it gets worse, then notify the clinic.  Please CC this note to Dr. Wynonia Lawman.  Thanks.

## 2012-09-04 ENCOUNTER — Ambulatory Visit (HOSPITAL_BASED_OUTPATIENT_CLINIC_OR_DEPARTMENT_OTHER): Payer: Medicare Other | Attending: Pulmonary Disease | Admitting: Radiology

## 2012-09-04 VITALS — Ht 61.0 in | Wt 177.0 lb

## 2012-09-04 DIAGNOSIS — G4733 Obstructive sleep apnea (adult) (pediatric): Secondary | ICD-10-CM | POA: Diagnosis not present

## 2012-09-12 DIAGNOSIS — G4733 Obstructive sleep apnea (adult) (pediatric): Secondary | ICD-10-CM

## 2012-09-13 ENCOUNTER — Ambulatory Visit (INDEPENDENT_AMBULATORY_CARE_PROVIDER_SITE_OTHER): Payer: Medicare Other | Admitting: Family Medicine

## 2012-09-13 ENCOUNTER — Encounter: Payer: Self-pay | Admitting: Family Medicine

## 2012-09-13 VITALS — BP 146/92 | HR 86 | Temp 98.4°F | Wt 174.0 lb

## 2012-09-13 DIAGNOSIS — R21 Rash and other nonspecific skin eruption: Secondary | ICD-10-CM

## 2012-09-13 MED ORDER — LAMOTRIGINE 100 MG PO TABS: 100.0000 mg | ORAL_TABLET | Freq: Every day | ORAL | Status: DC

## 2012-09-13 MED ORDER — TRIAMCINOLONE ACETONIDE 0.1 % EX CREA: TOPICAL_CREAM | Freq: Two times a day (BID) | CUTANEOUS | Status: DC

## 2012-09-13 NOTE — Patient Instructions (Signed)
I would use OTC hydrocortisone twice a day for a few days.  If not better, then change to triamcinolone.  Take care.  This doesn't appear to be related to the lamictal.

## 2012-09-13 NOTE — Assessment & Plan Note (Signed)
Likely combination of dry skin, eczema, cold weather and scratching.  Not typical at all for TEN/lamictal rash.  Not alarming exam.  Would use topical hydrocortisone and then TAC if not resolved.  She agrees.  F/u prn.

## 2012-09-13 NOTE — Procedures (Signed)
NAME:  Robin Juarez, Robin Juarez            ACCOUNT NO.:  [id number]  MEDICAL RECORD NO.:  [id number]          PATIENT TYPE:  OUT  LOCATION:  SLEEP CENTER                 FACILITY:  [facility]  PHYSICIAN:  Coralyn Helling, MD        DATE OF BIRTH:  11-10-1930  DATE OF STUDY:  09/04/2012                           NOCTURNAL POLYSOMNOGRAM  REFERRING PHYSICIAN:  Coralyn Helling, MD  INDICATION FOR STUDY:  [name] is a 79 year old female who has a history of depression and was previously diagnosed with obstructive sleep apnea in 2010.  She had not been on therapy for this recently. She reports having a recent surgical procedure and was advised by the surgical team afterwards that she should reconsider therapy for her sleep apnea.  She does have continued difficulties with snoring and sleep disruption.  She is referred to the sleep lab for further evaluation of hypersomnia with obstructive sleep apnea.  Height is 5 feet 1 inches, weight is 177 pounds, BMI is 33, neck size is 14.5 inches.  EPWORTH SLEEPINESS SCORE:  2.  MEDICATIONS:  Aspirin, Lamictal, Cymbalta, folic acid, Neurontin, Zyprexa.  SLEEP ARCHITECTURE:  Total recording time was 373 minutes, total sleep time was 256 minutes.  Sleep efficiency was 69%, sleep latency was 25 minutes.  REM latency was 310 minutes.  The study was notable for lack of slow-wave sleep and she slept predominantly in the non-supine position.  RESPIRATORY DATA:  The average respiratory rate was 14.  Moderate snoring was noted by the technician.  The overall apnea/hypopnea index was 11.  There were 11 central apneic events.  The remainder of the events were obstructive in nature.  The REM apnea-hypopnea index was 42.2.  OXYGEN DATA:  The baseline oxygenation was 93%.  The oxygen saturation nadir was 86%.  The study was conducted without the use of supplemental oxygen.  CARDIAC DATA:  The average heart rate was 76 and the rhythm strip showed sinus rhythm with  occasional PACs.  MOVEMENT-PARASOMNIA:  The periodic limb movement index was 18.7 and the patient had 2 restroom trips.  IMPRESSIONS-RECOMMENDATIONS:  This study shows evidence for mild obstructive sleep apnea with an apnea/hypopnea index of 11 and oxygen saturation nadir of 86%.  She did have a significant REM effect to her sleep-disordered breathing.  She had an increase in her periodic limb movement index and clinical correlation would be necessary to determine the significance of this.  In addition to diet, exercise, and weight reduction, additional therapeutic interventions for her sleep apnea could include CPAP therapy, oral appliance, or surgical intervention.     Coralyn Helling, MD Diplomat, American Board of Sleep Medicine    VS/MEDQ  D:  09/12/2012 15:34:44  T:  09/13/2012 02:11:34  Job:  [id number]

## 2012-09-13 NOTE — Progress Notes (Signed)
Her tremor isn't worse.   She has rash on the back of the R thigh.  Has been there for a few months.  She was concerned about the lamictal.  Psych advised her to cut the dose in half and get checked.  No FCNAVD.  She feels "pretty good", at baseline.  Rash is mildly itchy.  Lamictal isn't a new medicine for her.  She has been on it since last summer.  Rash is only minimally enlarging.    Meds, vitals, and allergies reviewed.   ROS: See HPI.  Otherwise, noncontributory.  nad Chaperoned exam with dry excoriated skin on the R leg but no ulceration or sign of TEN/SJS.

## 2012-09-16 ENCOUNTER — Telehealth: Payer: Self-pay | Admitting: Pulmonary Disease

## 2012-09-16 NOTE — Telephone Encounter (Signed)
PSG 09/04/12 >> AHI 11, SpO2 low 86%, REM AHI 42.2, PLMI 18.7.  Will have my nurse schedule ROV to review results.

## 2012-09-18 ENCOUNTER — Telehealth: Payer: Self-pay | Admitting: Pulmonary Disease

## 2012-09-18 NOTE — Telephone Encounter (Signed)
Mindy, pls advise if pt can be worked in sooner.

## 2012-09-18 NOTE — Telephone Encounter (Signed)
lmtcb x1 w/ family member  

## 2012-09-20 NOTE — Telephone Encounter (Signed)
I spoke with pt and is scheduled for 10/11/12 at 4:30

## 2012-09-26 DIAGNOSIS — R350 Frequency of micturition: Secondary | ICD-10-CM | POA: Diagnosis not present

## 2012-09-26 DIAGNOSIS — N3946 Mixed incontinence: Secondary | ICD-10-CM | POA: Diagnosis not present

## 2012-10-07 ENCOUNTER — Telehealth: Payer: Self-pay | Admitting: Family Medicine

## 2012-10-07 NOTE — Telephone Encounter (Signed)
She had some abd pain about 1 week ago, followed by onset of diarrhea.  In the interval, she realized that bland foods alleviated the sx.  In the meantime, generic imodium didn't help.  No fevers.  Abd pain is resolved.  No vomiting.  Dairy made the symptoms worse.  She had not taken more than 2 imodium in a day.  She has no blood or mucous in stool.  We talked about options.  I would avoid dairy/nonbland foods for a few more days and take up to 4 imodium in a day.  She'll call back as needed.  She agrees.

## 2012-10-07 NOTE — Telephone Encounter (Signed)
Patient Information:  Caller Name: Genni  Phone: [phone]  Patient: Robin Juarez, Robin Juarez  Gender: Female  DOB: 1930/09/25  Age: 79 Years  PCP: Crawford Givens Clelia Croft) [facility])  Office Follow Up:  Does the office need to follow up with this patient?: Yes   Instructions For The Office: Please call patient regarding her diarrhea.  She asked for Rx to resolve it, since OTC medication has not worked.  Bland starchy foods do not precipitate diarrhea, but diarrhea resumes when she is eating her usual diet.     Symptoms  Reason For Call & Symptoms: Intermittent diarrhea mostly after eating.  It returned if she ate usual foods; could eat bland starch such as cream of wheat without recurrence.  Estimated 2-3 watery  stools per day.  Caller asked for Rx to stop the diarrhea.  Callback by PCP Today per Diarrhea protocol due to Age > 70 years.  Caller declined offer of appointment.  Reviewed Health History In EMR: Yes  Reviewed Medications In EMR: Yes  Reviewed Allergies In EMR: Yes  Reviewed Surgeries / Procedures: Yes  Date of Onset of Symptoms: 10/02/2012  Treatments Tried: Imodium/ generic with some relief.  Gatorade, Ginger Ale to prevent dehydration.  Treatments Tried Worked: Yes  Guideline(s) Used:  Diarrhea  Disposition Per Guideline:   Callback by PCP Today  Reason For Disposition Reached:   Age > 70 years  Advice Given: Increase liquid  Intake unless on fluid restrictions.  Parke Simmers starchy foods. Call if worse or new questions or problem.

## 2012-10-11 ENCOUNTER — Ambulatory Visit (INDEPENDENT_AMBULATORY_CARE_PROVIDER_SITE_OTHER): Payer: Medicare Other | Admitting: Pulmonary Disease

## 2012-10-11 ENCOUNTER — Encounter: Payer: Self-pay | Admitting: Pulmonary Disease

## 2012-10-11 VITALS — BP 122/76 | HR 71 | Ht 61.5 in | Wt 177.0 lb

## 2012-10-11 DIAGNOSIS — G4733 Obstructive sleep apnea (adult) (pediatric): Secondary | ICD-10-CM | POA: Diagnosis not present

## 2012-10-11 NOTE — Progress Notes (Signed)
Chief Complaint  Patient presents with  . Results    pt here to discuss sleep study results.     History of Present Illness: Robin Juarez is a 79 y.o. female with OSA.  She is here to review her sleep study.  She remembers trying CPAP before, but couldn't use it.  She didn't like how any of the masks fit.  Tests: PSG 10/27/08 >> AHI 15.4 CPAP titration 12/08/08 >> CPAP 7 cm H2O PSG 09/04/12 >> AHI 11, SpO2 low 86%, REM AHI 42.2, PLMI 18.7.  Past Medical History  Diagnosis Date  . Hyperlipidemia   . GERD (gastroesophageal reflux disease)   . Osteoporosis   . Arthritis   . Fibromyalgia   . UTI (lower urinary tract infection)   . Urinary incontinence   . Depression 1993, 2000  . Blood transfusion without reported diagnosis   . OSA (obstructive sleep apnea)     dx 2010-had a cpap-does not use-    Past Surgical History  Procedure Laterality Date  . Cardiovascular stress test  2009    Normal, Dr. Mayford Knife  . Breast biopsy  1995    x 2  . Abdominal hysterectomy  1976  . Back fusions  1977    missing vertebra  . Childbirth  [phone]  . Ovarian cyst removal  1964  . Appendectomy  1964  . Dilatation and currettage  1964  . Cataract extraction, bilateral    . Breast biopsy  07/10/2012    Procedure: BREAST BIOPSY WITH NEEDLE LOCALIZATION;  Surgeon: Emelia Loron, MD;  Location: [facility];  Service: General;  Laterality: Right;    Current Outpatient Prescriptions on File Prior to Visit  Medication Sig Dispense Refill  . aspirin 81 MG tablet Take 81 mg by mouth daily.        . cholecalciferol (VITAMIN D) 1000 UNITS tablet Take 1,000 Units by mouth daily.        [name] co-enzyme Q-10 30 MG capsule Take 100 mg by mouth daily.      . DULoxetine (CYMBALTA) 60 MG capsule Take 60 mg by mouth daily.       . folic acid (FOLVITE) 400 MCG tablet Take 600 mcg by mouth daily.      [name] gabapentin (NEURONTIN) 300 MG capsule Take 300 mg by mouth 3 (three) times  daily.       [name] lamoTRIgine (LAMICTAL) 100 MG tablet Take 1 tablet (100 mg total) by mouth daily.      [name] OLANZapine (ZYPREXA) 5 MG tablet Take one half tablet by mouth at bedtime.      . triamcinolone cream (KENALOG) 0.1 % Apply topically 2 (two) times daily.  30 g  1   No current facility-administered medications on file prior to visit.    Allergies  Allergen Reactions  . Alendronate Sodium     cramps  . Belladonna Alk-Phenobarbital     REACTION: (Maybe)  . Cefaclor     Felt bad  . Cephalexin     Felt bad  . Chlordiazepoxide     REACTION: (Maybe)  . Digoxin     Felt weak, tired, "aweful"  . Hydrocodone-Acetaminophen     REACTION: Bad effect with other meds.  . Nitrofurantoin     Sharp pain back of head  . Oxcarbazepine   . Simvastatin     Myalgias, possible intolerance  . Streptomycin     Lips itched  . Sulfonamide Derivatives     REACTION: (Maybe)  .  Tetracycline     itching  . Fish Oil Rash    rash  . Omeprazole Rash    rash    Family History  Problem Relation Age of Onset  . Hyperlipidemia Brother   . Arthritis Mother   . Hyperlipidemia Sister   . Hypertension Brother   . Kidney disease Maternal Grandmother   . Stroke Father   . Parkinsonism Sister   . Heart disease Brother     Died during CABG  . Hypertension Sister   . Aneurysm Sister     Brain  . Hypertension Father     History  Substance Use Topics  . Smoking status: Never Smoker   . Smokeless tobacco: Never Used  . Alcohol Use: No      Physical Exam: Filed Vitals:   10/11/12 1617  BP: 122/76  Pulse: 71  Height: 5' 1.5" (1.562 m)  Weight: 177 lb (80.287 kg)  SpO2: 93%    Wt Readings from Last 3 Encounters:  10/11/12 177 lb (80.287 kg)  09/13/12 174 lb (78.926 kg)  09/04/12 177 lb (80.287 kg)    Body mass index is 32.91 kg/(m^2).   General - No distress ENT - No sinus tenderness, no oral exudate, MP 3, enlarged tongue with scalloped border, +2 tonsil, no LAN Cardiac - s1s2  regular, no murmur Chest - No wheeze/rales/dullness Back - No focal tenderness Abd - Soft, non-tender Ext - No edema Neuro - Normal strength Skin - No rashes Psych - Normal mood, and behavior   Assessment/Plan:  Robin Helling, MD [place] [facility] Pager:  [phone] 10/11/2012, 4:19 PM

## 2012-10-11 NOTE — Patient Instructions (Signed)
Will arrange for referral to Dr. Althea Grimmer to fit for oral appliance to treat sleep apnea Follow up in 6 months

## 2012-10-11 NOTE — Assessment & Plan Note (Signed)
Overall she has mild sleep apnea, but this was severe in REM sleep.  I have reviewed her sleep test results with the patient.  Explained how sleep apnea can affect the patient's health.  Driving precautions and importance of weight loss were discussed.  Treatment options for sleep apnea were reviewed.  She has tried CPAP before, and was not able to tolerate this.  She does not think she could use CPAP again.  As such, will arrange for referral to Dr. Althea Grimmer to assess for oral appliance to treat her obstructive sleep apnea.

## 2012-11-04 ENCOUNTER — Other Ambulatory Visit: Payer: Self-pay | Admitting: Urology

## 2012-11-12 DIAGNOSIS — F329 Major depressive disorder, single episode, unspecified: Secondary | ICD-10-CM | POA: Diagnosis not present

## 2012-11-12 DIAGNOSIS — F068 Other specified mental disorders due to known physiological condition: Secondary | ICD-10-CM | POA: Diagnosis not present

## 2012-11-12 DIAGNOSIS — F3289 Other specified depressive episodes: Secondary | ICD-10-CM | POA: Diagnosis not present

## 2012-11-19 ENCOUNTER — Encounter (HOSPITAL_BASED_OUTPATIENT_CLINIC_OR_DEPARTMENT_OTHER): Payer: Self-pay | Admitting: *Deleted

## 2012-11-19 DIAGNOSIS — R32 Unspecified urinary incontinence: Secondary | ICD-10-CM | POA: Diagnosis not present

## 2012-11-19 NOTE — Progress Notes (Signed)
-  Instructed to come to [facility] before end of this week for preop lab work-CBC,PT/INR,PTT. To [facility] at 0615 on DOS-will need Type and screen that am.Npo after Mn-may take cymbalta, neurontin with small amt water that am.

## 2012-11-20 DIAGNOSIS — R350 Frequency of micturition: Secondary | ICD-10-CM | POA: Diagnosis not present

## 2012-11-20 DIAGNOSIS — F3289 Other specified depressive episodes: Secondary | ICD-10-CM | POA: Diagnosis not present

## 2012-11-20 DIAGNOSIS — Z7982 Long term (current) use of aspirin: Secondary | ICD-10-CM | POA: Diagnosis not present

## 2012-11-20 DIAGNOSIS — N393 Stress incontinence (female) (male): Secondary | ICD-10-CM | POA: Diagnosis not present

## 2012-11-20 DIAGNOSIS — E7800 Pure hypercholesterolemia, unspecified: Secondary | ICD-10-CM | POA: Diagnosis not present

## 2012-11-20 DIAGNOSIS — F411 Generalized anxiety disorder: Secondary | ICD-10-CM | POA: Diagnosis not present

## 2012-11-20 DIAGNOSIS — G4730 Sleep apnea, unspecified: Secondary | ICD-10-CM | POA: Diagnosis not present

## 2012-11-20 DIAGNOSIS — Z883 Allergy status to other anti-infective agents status: Secondary | ICD-10-CM | POA: Diagnosis not present

## 2012-11-20 DIAGNOSIS — Z79899 Other long term (current) drug therapy: Secondary | ICD-10-CM | POA: Diagnosis not present

## 2012-11-20 DIAGNOSIS — K219 Gastro-esophageal reflux disease without esophagitis: Secondary | ICD-10-CM | POA: Diagnosis not present

## 2012-11-20 DIAGNOSIS — Z882 Allergy status to sulfonamides status: Secondary | ICD-10-CM | POA: Diagnosis not present

## 2012-11-20 DIAGNOSIS — F329 Major depressive disorder, single episode, unspecified: Secondary | ICD-10-CM | POA: Diagnosis not present

## 2012-11-20 LAB — PROTIME-INR
INR: 0.91 (ref 0.00–1.49)
Prothrombin Time: 12.2 seconds (ref 11.6–15.2)

## 2012-11-20 LAB — CBC
Platelets: 250 10*3/uL (ref 150–400)
RBC: 4.37 MIL/uL (ref 3.87–5.11)
RDW: 13.1 % (ref 11.5–15.5)
WBC: 8.2 10*3/uL (ref 4.0–10.5)

## 2012-11-20 LAB — APTT: aPTT: 36 seconds (ref 24–37)

## 2012-11-22 NOTE — H&P (Signed)
History of Present Illness   Robin Juarez has worsening stress incontinence. She still has urge incontinence but she thinks the stress incontinence is more severe. I dictated long notes on her in the past and she is no longer doing PTNS and she is off all medications. She does not want to try Myrbetriq.   Review of Systems: No change in bowel or neurologic systems.   Urinalysis: I reviewed, negative.   There is no other modifying factors or associated signs or symptoms. There is no other aggravating or relieving factors. The symptoms are moderate in severity and getting worse.    Past Medical History Problems  1. History of  Adult Sleep Apnea 780.57 2. History of  Anxiety (Symptom) 300.00 3. History of  Depression 311 4. History of  Esophageal Reflux 530.81 5. History of  Hypercholesterolemia 272.0  Surgical History Problems  1. History of  Back Surgery 2. History of  Cataract Surgery 3. History of  Hysterectomy V45.77 4. History of  Oophorectomy V45.77  Current Meds 1. Aspirin 81 MG Oral Tablet; Therapy: (Recorded:15Dec2011) to 2. Co Q10 CAPS; Therapy: (Recorded:15Dec2011) to 3. Cymbalta CPEP; Therapy: (Recorded:15Dec2011) to 4. Fish Oil CAPS; Therapy: (Recorded:15Dec2011) to 5. Folic Acid CAPS; Therapy: (Recorded:15Dec2011) to 6. Gabapentin 300 MG TABS; Therapy: (Recorded:15Dec2011) to 7. Multi-Vitamin TABS; Therapy: (Recorded:15Dec2011) to 8. Toviaz 8 MG Oral Tablet Extended Release 24 Hour; TAKE 1 TABLET DAILY; Therapy: 25Jan2012  to (Evaluate:19Jan2013); Last Rx:25Jan2012 9. Vitamin D TABS; Therapy: (Recorded:15Dec2011) to 10. Zocor 20 MG Oral Tablet; Therapy: (Recorded:15Dec2011) to 11. ZyPREXA TABS; Therapy: (Recorded:15Dec2011) to  Allergies Medication  1. Lanoxin TABS 2. Ceclor CAPS 3. Donnatal TABS 4. Fosamax TABS 5. Keflex TABS 6. Librium CAPS 7. Macrobid CAPS 8. Streptomycin Sulfate SOLR 9. Sulfa Drugs 10. Tetracyclines 11. Trileptal TABS  Family  History Problems  1. Paternal history of  Death In The Family Father father passed @ age 44stroke 2. Maternal history of  Death In The Family Mother mother passed @ age 33sepsis 3. Family history of  Family Health Status Number Of Children 3 sons1 daughter 4. Fraternal history of  Heart Disease V17.49 5. Fraternal history of  Hypertension V17.49 6. Paternal history of  Hypertension V17.49 7. Sororal history of  Hypertension V17.49  Social History Problems  1. Activities Of Daily Living 2. Caffeine Use 1-2 drinks daily 3. Exercise Habits no reg exer but is active w chores 4. Living Independently With Spouse 5. Marital History - Currently Married 6. Occupation: Retired 7. Self-reliant In Usual Daily Activities Denied  8. History of  Alcohol Use 9. History of  Tobacco Use  Results/Data Urine [Data Includes: Last 1 Day]   06Feb2014  COLOR YELLOW   APPEARANCE CLEAR   SPECIFIC GRAVITY 1.025   pH 6.0   GLUCOSE NEG mg/dL  BILIRUBIN NEG   KETONE TRACE mg/dL  BLOOD NEG   PROTEIN NEG mg/dL  UROBILINOGEN 0.2 mg/dL  NITRITE NEG   LEUKOCYTE ESTERASE NEG    Assessment Assessed  1. Urge And Stress Incontinence 788.33 2. Urinary Frequency 788.41  Plan Health Maintenance (V70.0)  1. UA With REFLEX  Done: 06Feb2014 02:53PM  Discussion/Summary   I drew Ms. Woodrick a picture. Myrbetriq versus watchful waiting versus a sling was discussed.  We talked about a sling in detail. Pros, cons, general surgical and anesthetic risks, and other options including behavioral therapy and watchful waiting were discussed. She understands that slings are generally successful in 90% of cases for stress incontinence, 50% for urge incontinence,  and that in a small percentage of cases the incontinence can worsen. The risk of persistent, de novo, or worsening incontinence/dysfunction was discussed. Risks were described but not limited to the discussion of injury to neighboring structures including  the bowel (with possible life-threatening sepsis and colostomy), bladder, urethra, vagina (all resulting in further surgery), and ureter (resulting in re-implantation). We also talked about the risk of retention requiring urethrolysis, extrusion requiring revision, and erosion resulting in further surgery. Bleeding risks and transfusion rates and the risk of infection were discussed. The risk of pelvic and abdominal pain syndromes, dyspareunia, and neuropathies were discussed. The need for CIC was described as well as the usual postoperative course. The patient understands that she might not reach her treatment goal and that she might be worse following surgery. Mesh TV issues were discussed.  She understands her overactive bladder could persist and her incontinence could worsen. She would like to do this under a spinal since when she had a lumpectomy a few months ago, the anesthesia threw off the medicine for a few months, and I thought this was reasonable. We will proceed accordingly.   After a thorough review of the management options for the patient's condition the patient  elected to proceed with surgical therapy as noted above. We have discussed the potential benefits and risks of the procedure, side effects of the proposed treatment, the likelihood of the patient achieving the goals of the procedure, and any potential problems that might occur during the procedure or recuperation. Informed consent has been obtained.

## 2012-11-25 ENCOUNTER — Encounter (HOSPITAL_BASED_OUTPATIENT_CLINIC_OR_DEPARTMENT_OTHER): Payer: Self-pay | Admitting: Anesthesiology

## 2012-11-25 ENCOUNTER — Encounter (HOSPITAL_BASED_OUTPATIENT_CLINIC_OR_DEPARTMENT_OTHER): Payer: Self-pay | Admitting: *Deleted

## 2012-11-25 ENCOUNTER — Ambulatory Visit (HOSPITAL_BASED_OUTPATIENT_CLINIC_OR_DEPARTMENT_OTHER): Payer: Medicare Other | Admitting: Anesthesiology

## 2012-11-25 ENCOUNTER — Ambulatory Visit (HOSPITAL_BASED_OUTPATIENT_CLINIC_OR_DEPARTMENT_OTHER)
Admission: RE | Admit: 2012-11-25 | Discharge: 2012-11-25 | Disposition: A | Discharge status: Home / Self Care | Payer: Medicare Other | Source: Ambulatory Visit | Attending: Urology | Admitting: Urology

## 2012-11-25 ENCOUNTER — Encounter (HOSPITAL_BASED_OUTPATIENT_CLINIC_OR_DEPARTMENT_OTHER)
Admission: RE | Disposition: A | Discharge status: Home / Self Care | Payer: Self-pay | Source: Ambulatory Visit | Attending: Urology

## 2012-11-25 DIAGNOSIS — R350 Frequency of micturition: Secondary | ICD-10-CM | POA: Diagnosis not present

## 2012-11-25 DIAGNOSIS — Z883 Allergy status to other anti-infective agents status: Secondary | ICD-10-CM | POA: Insufficient documentation

## 2012-11-25 DIAGNOSIS — N393 Stress incontinence (female) (male): Secondary | ICD-10-CM | POA: Diagnosis not present

## 2012-11-25 DIAGNOSIS — G4730 Sleep apnea, unspecified: Secondary | ICD-10-CM | POA: Insufficient documentation

## 2012-11-25 DIAGNOSIS — F411 Generalized anxiety disorder: Secondary | ICD-10-CM | POA: Insufficient documentation

## 2012-11-25 DIAGNOSIS — Z7982 Long term (current) use of aspirin: Secondary | ICD-10-CM | POA: Insufficient documentation

## 2012-11-25 DIAGNOSIS — Z79899 Other long term (current) drug therapy: Secondary | ICD-10-CM | POA: Insufficient documentation

## 2012-11-25 DIAGNOSIS — F329 Major depressive disorder, single episode, unspecified: Secondary | ICD-10-CM | POA: Insufficient documentation

## 2012-11-25 DIAGNOSIS — Z882 Allergy status to sulfonamides status: Secondary | ICD-10-CM | POA: Insufficient documentation

## 2012-11-25 DIAGNOSIS — F3289 Other specified depressive episodes: Secondary | ICD-10-CM | POA: Insufficient documentation

## 2012-11-25 DIAGNOSIS — E7800 Pure hypercholesterolemia, unspecified: Secondary | ICD-10-CM | POA: Insufficient documentation

## 2012-11-25 DIAGNOSIS — K219 Gastro-esophageal reflux disease without esophagitis: Secondary | ICD-10-CM | POA: Insufficient documentation

## 2012-11-25 HISTORY — PX: CYSTOSCOPY: SHX5120

## 2012-11-25 HISTORY — PX: PUBOVAGINAL SLING: SHX1035

## 2012-11-25 LAB — TYPE AND SCREEN
ABO/RH(D): O POS
Antibody Screen: NEGATIVE

## 2012-11-25 SURGERY — CYSTOSCOPY
Anesthesia: Spinal | Site: Urethra | Wound class: Clean Contaminated

## 2012-11-25 MED ORDER — STERILE WATER FOR IRRIGATION IR SOLN
Status: DC | PRN
  Administered 2012-11-25: 1000 mL via INTRAVESICAL

## 2012-11-25 MED ORDER — PROPOFOL 10 MG/ML IV BOLUS
INTRAVENOUS | Status: DC | PRN
  Administered 2012-11-25: 20 mg via INTRAVENOUS

## 2012-11-25 MED ORDER — SODIUM CHLORIDE 0.9 % IR SOLN
Status: DC | PRN
  Administered 2012-11-25: 08:00:00

## 2012-11-25 MED ORDER — LACTATED RINGERS IV SOLN
INTRAVENOUS | Status: DC
  Filled 2012-11-25: qty 1000

## 2012-11-25 MED ORDER — ESTRADIOL 0.1 MG/GM VA CREA
TOPICAL_CREAM | VAGINAL | Status: DC | PRN
  Administered 2012-11-25: 1 via VAGINAL

## 2012-11-25 MED ORDER — INDIGOTINDISULFONATE SODIUM 8 MG/ML IJ SOLN
INTRAMUSCULAR | Status: DC | PRN
  Administered 2012-11-25: 5 mL via INTRAVENOUS

## 2012-11-25 MED ORDER — OXYCODONE-ACETAMINOPHEN 10-650 MG PO TABS: 1.0000 | ORAL_TABLET | Freq: Four times a day (QID) | ORAL | Status: DC | PRN

## 2012-11-25 MED ORDER — LIDOCAINE-EPINEPHRINE (PF) 1 %-1:200000 IJ SOLN
INTRAMUSCULAR | Status: DC | PRN
  Administered 2012-11-25: 6 mL

## 2012-11-25 MED ORDER — PROPOFOL 10 MG/ML IV EMUL
INTRAVENOUS | Status: DC | PRN
  Administered 2012-11-25: 25 ug/kg/min via INTRAVENOUS

## 2012-11-25 MED ORDER — CIPROFLOXACIN HCL 250 MG PO TABS: 250.0000 mg | ORAL_TABLET | Freq: Two times a day (BID) | ORAL | Status: DC

## 2012-11-25 MED ORDER — OXYCODONE-ACETAMINOPHEN 5-325 MG PO TABS
1.0000 | ORAL_TABLET | Freq: Four times a day (QID) | ORAL | Status: DC | PRN
  Administered 2012-11-25: 1 via ORAL
  Filled 2012-11-25: qty 2

## 2012-11-25 MED ORDER — FENTANYL CITRATE 0.05 MG/ML IJ SOLN
25.0000 ug | INTRAMUSCULAR | Status: DC | PRN
  Filled 2012-11-25: qty 1

## 2012-11-25 MED ORDER — FENTANYL CITRATE 0.05 MG/ML IJ SOLN
INTRAMUSCULAR | Status: DC | PRN
  Administered 2012-11-25: 50 ug via INTRAVENOUS

## 2012-11-25 MED ORDER — BUPIVACAINE IN DEXTROSE 0.75-8.25 % IT SOLN
INTRATHECAL | Status: DC | PRN
  Administered 2012-11-25: 1.3 mL via INTRATHECAL

## 2012-11-25 MED ORDER — CIPROFLOXACIN IN D5W 400 MG/200ML IV SOLN
400.0000 mg | INTRAVENOUS | Status: AC
  Administered 2012-11-25: 400 mg via INTRAVENOUS
  Filled 2012-11-25: qty 200

## 2012-11-25 MED ORDER — MEPERIDINE HCL 25 MG/ML IJ SOLN
6.2500 mg | INTRAMUSCULAR | Status: DC | PRN
  Filled 2012-11-25: qty 1

## 2012-11-25 MED ORDER — PROMETHAZINE HCL 25 MG/ML IJ SOLN
6.2500 mg | INTRAMUSCULAR | Status: DC | PRN
  Filled 2012-11-25: qty 1

## 2012-11-25 MED ORDER — ONDANSETRON HCL 4 MG/2ML IJ SOLN
INTRAMUSCULAR | Status: DC | PRN
  Administered 2012-11-25: 4 mg via INTRAVENOUS

## 2012-11-25 MED ORDER — LACTATED RINGERS IV SOLN
INTRAVENOUS | Status: DC
  Administered 2012-11-25 (×3): via INTRAVENOUS
  Filled 2012-11-25: qty 1000

## 2012-11-25 SURGICAL SUPPLY — 55 items
BAG URINE DRAINAGE (UROLOGICAL SUPPLIES) IMPLANT
BALLN NEPHROSTOMY (BALLOONS)
BALLOON NEPHROSTOMY (BALLOONS) IMPLANT
BLADE HEX COATED 2.75 (ELECTRODE) ×3 IMPLANT
BLADE SURG 10 STRL SS (BLADE) ×3 IMPLANT
BLADE SURG 15 STRL LF DISP TIS (BLADE) ×2 IMPLANT
BLADE SURG 15 STRL SS (BLADE) ×3
BLADE SURG ROTATE 9660 (MISCELLANEOUS) ×3 IMPLANT
CANISTER SUCTION 1200CC (MISCELLANEOUS) ×6 IMPLANT
CATH FOLEY 2WAY SLVR  5CC 16FR (CATHETERS) ×3
CATH FOLEY 2WAY SLVR 5CC 16FR (CATHETERS) ×2 IMPLANT
CLOTH BEACON ORANGE TIMEOUT ST (SAFETY) ×3 IMPLANT
COVER MAYO STAND STRL (DRAPES) ×6 IMPLANT
COVER TABLE BACK 60X90 (DRAPES) ×3 IMPLANT
DERMABOND ADVANCED (GAUZE/BANDAGES/DRESSINGS) ×1
DERMABOND ADVANCED .7 DNX12 (GAUZE/BANDAGES/DRESSINGS) ×2 IMPLANT
DRAPE UNDERBUTTOCKS STRL (DRAPE) ×3 IMPLANT
ELECT REM PT RETURN 9FT ADLT (ELECTROSURGICAL) ×3
ELECTRODE REM PT RTRN 9FT ADLT (ELECTROSURGICAL) ×2 IMPLANT
GAUZE SPONGE 4X4 16PLY XRAY LF (GAUZE/BANDAGES/DRESSINGS) IMPLANT
GLOVE BIO SURGEON STRL SZ 6.5 (GLOVE) ×6 IMPLANT
GLOVE BIO SURGEON STRL SZ7.5 (GLOVE) ×6 IMPLANT
GLOVE ECLIPSE 6.0 STRL STRAW (GLOVE) ×3 IMPLANT
GOWN PREVENTION PLUS LG XLONG (DISPOSABLE) IMPLANT
GOWN STRL NON-REIN LRG LVL3 (GOWN DISPOSABLE) ×3 IMPLANT
GOWN STRL REIN XL XLG (GOWN DISPOSABLE) ×3 IMPLANT
GUIDEWIRE STR DUAL SENSOR (WIRE) IMPLANT
HOLDER FOLEY CATH W/STRAP (MISCELLANEOUS) ×3 IMPLANT
NDL SAFETY ECLIPSE 18X1.5 (NEEDLE) IMPLANT
NEEDLE HYPO 18GX1.5 SHARP (NEEDLE)
NEEDLE HYPO 22GX1.5 SAFETY (NEEDLE) ×3 IMPLANT
NS IRRIG 500ML POUR BTL (IV SOLUTION) IMPLANT
PACK BASIN DAY SURGERY FS (CUSTOM PROCEDURE TRAY) ×3 IMPLANT
PACK CYSTOSCOPY (CUSTOM PROCEDURE TRAY) ×3 IMPLANT
PACKING VAGINAL (PACKING) ×3 IMPLANT
PENCIL BUTTON HOLSTER BLD 10FT (ELECTRODE) ×3 IMPLANT
PLUG CATH AND CAP STER (CATHETERS) ×3 IMPLANT
SET IRRIG Y TYPE TUR BLADDER L (SET/KITS/TRAYS/PACK) ×3 IMPLANT
SHEET LAVH (DRAPES) ×3 IMPLANT
SLING SYSTEM SPARC (Sling) ×3 IMPLANT
SURGILUBE 2OZ TUBE FLIPTOP (MISCELLANEOUS) ×3 IMPLANT
SUT SILK 2 0 30  PSL (SUTURE)
SUT SILK 2 0 30 PSL (SUTURE) IMPLANT
SUT VIC AB 2-0 CT1 27 (SUTURE) ×6
SUT VIC AB 2-0 CT1 TAPERPNT 27 (SUTURE) ×4 IMPLANT
SUT VICRYL 4-0 PS2 18IN ABS (SUTURE) ×3 IMPLANT
SYR BULB IRRIGATION 50ML (SYRINGE) ×3 IMPLANT
SYR CONTROL 10ML LL (SYRINGE) ×3 IMPLANT
SYRINGE 10CC LL (SYRINGE) ×3 IMPLANT
TOWEL OR 17X24 6PK STRL BLUE (TOWEL DISPOSABLE) ×3 IMPLANT
TRAY DSU PREP LF (CUSTOM PROCEDURE TRAY) ×3 IMPLANT
TUBE CONNECTING 12X1/4 (SUCTIONS) ×6 IMPLANT
WATER STERILE IRR 3000ML UROMA (IV SOLUTION) ×3 IMPLANT
WATER STERILE IRR 500ML POUR (IV SOLUTION) IMPLANT
YANKAUER SUCT BULB TIP NO VENT (SUCTIONS) ×3 IMPLANT

## 2012-11-25 NOTE — Anesthesia Postprocedure Evaluation (Signed)
  Anesthesia Post-op Note  Patient: Robin Juarez  Procedure(s) Performed: Procedure(s) (LRB): CYSTOSCOPY (N/A) PUBO-VAGINAL SLING (N/A)  Patient Location: PACU  Anesthesia Type: Spinal  Level of Consciousness: awake and alert   Airway and Oxygen Therapy: Patient Spontanous Breathing  Post-op Pain: mild  Post-op Assessment: Post-op Vital signs reviewed, Patient's Cardiovascular Status Stable, Respiratory Function Stable, Patent Airway and No signs of Nausea or vomiting  Last Vitals:  Filed Vitals:   11/25/12 0930  BP: 151/65  Pulse: 73  Temp:   Resp: 11    Post-op Vital Signs: stable   Complications: No apparent anesthesia complications

## 2012-11-25 NOTE — Op Note (Signed)
Preoperative diagnosis: Stress urinary incontinence  Postoperative diagnosis: Stress urinary incontinence  Procedure: Sling cystourethropexy [facility]) and cystoscopy  Surgeon: Dr. Lorin Picket Luwanna Brossman  Estimated blood loss: < 100 milliliters  The patient was prepped and draped in the usual fashion. Extra care was taken with leg positioning to minimize the  risk of compartment syndrome, neuropathy, and deep vein thrombosis. Preoperative laboratory tests were normal and preoperative antibiotics were given. She had a well supported bladder neck and narrower vagina.   Two less than 1 cm incisions were made 1 fingerbreadth above the symphysis pubis 1.5 cm lateral to the midline. A 2 cm appropriate depth suburethral incision was made underneath the mid urethra after instilling approximately 5 cc of 1% lidocaine mixture. I sharply and bluntly dissected to the urethral vesical angle bilaterally.  With the bladder empty I passed a SPARC needle on top of and along the back of the symphysis pubis staying  on the periosteum and staying lateral using my box technique and delivering the needle onto the pulp of my  index finger bilaterally.  I cystoscoped the patient thoroughly and there was no injury to the bladder or urethra. There was no movement  or indentation of the bladder with movement of the trocar. There was excellent efflux of blue urine bilaterally.  With the bladder emptied I attached the [facility] sling and brought it up through the retropubic space bilaterally. I tensioned it over the fat part of a moderate size Kelly clamp. I cut below the blue dots, irrigated the sheaths, and removed the sheaths. I was very happy with the position and tension of the sling With appropriate hypermobility and no springback effect.  All incisions were irrigated. The sling was cut below the skin level. I closed the anterior vaginal wall with  running 2-0 Vicryl followed by my interrupted sutures. Interrupted 4-0  Vicryl was used for the abdominal incisions.  A catheter plug was used with the Foley catheter as well as a vaginal pack.  The patient was taken to the recovery room and hopefully this procedure will reach her treatment goal.

## 2012-11-25 NOTE — Anesthesia Preprocedure Evaluation (Addendum)
Anesthesia Evaluation  Patient identified by MRN, date of birth, ID band Patient awake    Reviewed: Allergy & Precautions, H&P , NPO status , Patient's Chart, lab work & pertinent test results  Airway Mallampati: II TM Distance: >3 FB Neck ROM: Full    Dental  (+) Teeth Intact and Dental Advisory Given   Pulmonary sleep apnea ,  Does not use CPAP; stopped on her own.  breath sounds clear to auscultation        Cardiovascular Rhythm:Regular Rate:Normal     Neuro/Psych    GI/Hepatic   Endo/Other    Renal/GU      Musculoskeletal   Abdominal   Peds  Hematology   Anesthesia Other Findings   Reproductive/Obstetrics                          Anesthesia Physical  Anesthesia Plan  ASA: II  Anesthesia Plan: Spinal   Post-op Pain Management:    Induction:   Airway Management Planned: Simple Face Mask  Additional Equipment:   Intra-op Plan:   Post-operative Plan:   Informed Consent: I have reviewed the patients History and Physical, chart, labs and discussed the procedure including the risks, benefits and alternatives for the proposed anesthesia with the patient or authorized representative who has indicated his/her understanding and acceptance.   Dental advisory given  Plan Discussed with: CRNA, Anesthesiologist and Surgeon  Anesthesia Plan Comments: (Pt requests SAB with sedation)       Anesthesia Quick Evaluation

## 2012-11-25 NOTE — Progress Notes (Signed)
Dr. Sherron Monday in and talked w pt.  Gave verbal d/c instructions. Pt verbalized her understanding.

## 2012-11-25 NOTE — Anesthesia Procedure Notes (Addendum)
Spinal  Patient location during procedure: OR Staffing Anesthesiologist: Phillips Grout Performed by: anesthesiologist  Preanesthetic Checklist Completed: patient identified, site marked, surgical consent, pre-op evaluation, timeout performed, IV checked, risks and benefits discussed and monitors and equipment checked Spinal Block Patient position: sitting Prep: Betadine Patient monitoring: heart rate, continuous pulse ox and blood pressure Approach: right paramedian Location: L2-3 Injection technique: single-shot Needle Needle type: Spinocan  Needle gauge: 22 G Needle length: 9 cm Additional Notes Expiration date of kit checked and confirmed. Patient tolerated procedure well, without complications.    Performed by: Fran Lowes    Procedure Name: MAC Date/Time: 11/25/2012 7:30 AM Performed by: Fran Lowes Pre-anesthesia Checklist: Patient identified, Timeout performed, Emergency Drugs available, Suction available and Patient being monitored Oxygen Delivery Method: Simple face mask

## 2012-11-25 NOTE — Transfer of Care (Signed)
Immediate Anesthesia Transfer of Care Note Immediate Anesthesia Transfer of Care Note  Patient: Robin Juarez  Procedure(s) Performed: Procedure(s) (LRB): CYSTOSCOPY (N/A) PUBO-VAGINAL SLING (N/A)  Patient Location: Patient transported to PACU with oxygen via face mask at 3 Liters / Min  Anesthesia Type: MAC  Level of Consciousness: awake and alert   Airway & Oxygen Therapy: Patient Spontanous Breathing and Patient connected to face mask oxygen  Post-op Assessment: Report given to PACU RN and Post -op Vital signs reviewed and stable  Post vital signs: Reviewed and stable  Complications: No apparent anesthesia complications

## 2012-11-25 NOTE — Interval H&P Note (Signed)
History and Physical Interval Note:  11/25/2012 7:17 AM  Robin Juarez  has presented today for surgery, with the diagnosis of stress incontinence  The various methods of treatment have been discussed with the patient and family. After consideration of risks, benefits and other options for treatment, the patient has consented to  Procedure(s): CYSTOSCOPY (N/A) PUBO-VAGINAL SLING (N/A) as a surgical intervention .  The patient's history has been reviewed, patient examined, no change in status, stable for surgery.  I have reviewed the patient's chart and labs.  Questions were answered to the patient's satisfaction.     Robin Juarez A

## 2012-11-26 ENCOUNTER — Encounter (HOSPITAL_BASED_OUTPATIENT_CLINIC_OR_DEPARTMENT_OTHER): Payer: Self-pay | Admitting: Urology

## 2012-12-09 DIAGNOSIS — N3946 Mixed incontinence: Secondary | ICD-10-CM | POA: Diagnosis not present

## 2013-01-10 ENCOUNTER — Encounter: Payer: Self-pay | Admitting: Family Medicine

## 2013-01-10 ENCOUNTER — Ambulatory Visit: Payer: Medicare Other | Admitting: Family Medicine

## 2013-01-10 ENCOUNTER — Ambulatory Visit (INDEPENDENT_AMBULATORY_CARE_PROVIDER_SITE_OTHER): Payer: Medicare Other | Admitting: Family Medicine

## 2013-01-10 VITALS — BP 130/80 | HR 83 | Temp 98.5°F | Wt 176.2 lb

## 2013-01-10 DIAGNOSIS — J069 Acute upper respiratory infection, unspecified: Secondary | ICD-10-CM

## 2013-01-10 MED ORDER — AMOXICILLIN 875 MG PO TABS: 875.0000 mg | ORAL_TABLET | Freq: Two times a day (BID) | ORAL | Status: DC

## 2013-01-10 NOTE — Assessment & Plan Note (Signed)
Nontoxic, likely resolving.  Would hold abx for now, start if facial pain returns. She agrees.  Supportive tx in meantime.  F/u prn.

## 2013-01-10 NOTE — Patient Instructions (Signed)
Start the amoxil if the facial or tooth pain returns.  Get some rest, drink plenty of fluids, and take the mucinex.

## 2013-01-10 NOTE — Progress Notes (Signed)
She is still followed by Dr. Danella [facility] with psych.  She has had some trouble sleeping, trouble with initiation.  She isn't manic; she is tired the next day.  She tried melatonin and it helped some.  She was going to continue with that if needed.  duration of symptoms: ~4 days Rhinorrhea:some, currently congestion:yes ear pain:no sore throat: yes, scratchy throat Cough:yes, throat clearing Myalgias: no other concerns: has been hoarse recently.   L facial pain noted.   She tried some mucinex for the chest congestion.  "I just felt awful this morning." Recent sick contact noted.  She has L upper tooth pain.   ROS: See HPI.  Otherwise negative.    Meds, vitals, and allergies reviewed.   GEN: nad, alert and oriented HEENT: mucous membranes moist, TM w/o erythema, nasal epithelium injected, OP with cobblestoning, sinuses not ttp x4 NECK: supple w/o LA CV: rrr. PULM: ctab, no inc wob ABD: soft, +bs EXT: no edema

## 2013-01-12 ENCOUNTER — Emergency Department (HOSPITAL_COMMUNITY): Payer: Medicare Other

## 2013-01-12 ENCOUNTER — Encounter (HOSPITAL_COMMUNITY): Payer: Self-pay

## 2013-01-12 ENCOUNTER — Emergency Department (HOSPITAL_COMMUNITY)
Admission: EM | Admit: 2013-01-12 | Discharge: 2013-01-12 | Disposition: A | Discharge status: Home / Self Care | Payer: Medicare Other | Attending: Emergency Medicine | Admitting: Emergency Medicine

## 2013-01-12 DIAGNOSIS — J209 Acute bronchitis, unspecified: Secondary | ICD-10-CM | POA: Diagnosis not present

## 2013-01-12 DIAGNOSIS — IMO0001 Reserved for inherently not codable concepts without codable children: Secondary | ICD-10-CM | POA: Diagnosis not present

## 2013-01-12 DIAGNOSIS — F3289 Other specified depressive episodes: Secondary | ICD-10-CM | POA: Diagnosis not present

## 2013-01-12 DIAGNOSIS — R059 Cough, unspecified: Secondary | ICD-10-CM | POA: Diagnosis not present

## 2013-01-12 DIAGNOSIS — R062 Wheezing: Secondary | ICD-10-CM | POA: Diagnosis not present

## 2013-01-12 DIAGNOSIS — Z87442 Personal history of urinary calculi: Secondary | ICD-10-CM | POA: Insufficient documentation

## 2013-01-12 DIAGNOSIS — E785 Hyperlipidemia, unspecified: Secondary | ICD-10-CM | POA: Diagnosis not present

## 2013-01-12 DIAGNOSIS — J40 Bronchitis, not specified as acute or chronic: Secondary | ICD-10-CM | POA: Diagnosis not present

## 2013-01-12 DIAGNOSIS — R05 Cough: Secondary | ICD-10-CM | POA: Insufficient documentation

## 2013-01-12 DIAGNOSIS — K219 Gastro-esophageal reflux disease without esophagitis: Secondary | ICD-10-CM | POA: Diagnosis not present

## 2013-01-12 DIAGNOSIS — Z79899 Other long term (current) drug therapy: Secondary | ICD-10-CM | POA: Insufficient documentation

## 2013-01-12 DIAGNOSIS — G4733 Obstructive sleep apnea (adult) (pediatric): Secondary | ICD-10-CM | POA: Diagnosis not present

## 2013-01-12 DIAGNOSIS — R0989 Other specified symptoms and signs involving the circulatory and respiratory systems: Secondary | ICD-10-CM | POA: Diagnosis not present

## 2013-01-12 DIAGNOSIS — M810 Age-related osteoporosis without current pathological fracture: Secondary | ICD-10-CM | POA: Insufficient documentation

## 2013-01-12 DIAGNOSIS — M129 Arthropathy, unspecified: Secondary | ICD-10-CM | POA: Insufficient documentation

## 2013-01-12 DIAGNOSIS — Z8742 Personal history of other diseases of the female genital tract: Secondary | ICD-10-CM | POA: Insufficient documentation

## 2013-01-12 DIAGNOSIS — F329 Major depressive disorder, single episode, unspecified: Secondary | ICD-10-CM | POA: Diagnosis not present

## 2013-01-12 DIAGNOSIS — Z792 Long term (current) use of antibiotics: Secondary | ICD-10-CM | POA: Insufficient documentation

## 2013-01-12 DIAGNOSIS — J019 Acute sinusitis, unspecified: Secondary | ICD-10-CM | POA: Diagnosis not present

## 2013-01-12 DIAGNOSIS — Z9849 Cataract extraction status, unspecified eye: Secondary | ICD-10-CM | POA: Insufficient documentation

## 2013-01-12 DIAGNOSIS — J329 Chronic sinusitis, unspecified: Secondary | ICD-10-CM

## 2013-01-12 NOTE — ED Provider Notes (Signed)
Medical screening examination/treatment/procedure(s) were performed by non-physician practitioner and as supervising physician I was immediately available for consultation/collaboration.  Jacqulin Brandenburger T Eliah Ozawa, MD 01/12/13 2328 

## 2013-01-12 NOTE — ED Notes (Signed)
Patient reports that she has had nasal and chest congestion x 6 days. Patient states she went to her PCP and was prescribed Amoxicillin. Patient has a productive cough with yellow sputum. Patient states she has taken amoxicillin x 1 dose and Mucinex.

## 2013-01-12 NOTE — ED Provider Notes (Signed)
History     CSN: [id number]  Arrival date & time 01/12/13  1719   First MD Initiated Contact with Patient 01/12/13 1814      Chief Complaint  Patient presents with  . Nasal Congestion    (Consider location/radiation/quality/duration/timing/severity/associated sxs/prior treatment) HPI Comments: Patient is a 79 year old female who presents with a 6 day history of chest and nasal congestion. Symptoms started gradually and progressively worsened since the onset. Patient saw her PCP 2 days ago who prescribed Amoxicillin to take if symptoms got worse. Patient reports worsening this morning so she got her prescription filled and took 1 dose. She reports sinus pressure and productive cough with yellow sputum. No aggravating/alleviating factors. No fevers.    Past Medical History  Diagnosis Date  . Hyperlipidemia   . GERD (gastroesophageal reflux disease)   . Osteoporosis   . Arthritis   . Fibromyalgia   . UTI (lower urinary tract infection)   . Urinary incontinence   . Depression 1993, 2000  . Blood transfusion without reported diagnosis   . OSA (obstructive sleep apnea)     dx 2010-had a cpap-does not use-    Past Surgical History  Procedure Laterality Date  . Cardiovascular stress test  2009    Normal, Dr. Mayford Knife  . Breast biopsy  1995    x 2  . Abdominal hysterectomy  1976  . Back fusions  1977    missing vertebra  . Childbirth  [phone]  . Ovarian cyst removal  1964  . Appendectomy  1964  . Dilatation and currettage  1964  . Cataract extraction, bilateral Bilateral 2005  . Breast biopsy  07/10/2012    Procedure: BREAST BIOPSY WITH NEEDLE LOCALIZATION;  Surgeon: Emelia Loron, MD;  Location: [facility];  Service: General;  Laterality: Right;  . Cystoscopy N/A 11/25/2012    Procedure: CYSTOSCOPY;  Surgeon: Martina Sinner, MD;  Location: [facility];  Service: Urology;  Laterality: N/A;  . Pubovaginal sling N/A 11/25/2012   Procedure: Leonides Grills;  Surgeon: Martina Sinner, MD;  Location: [facility];  Service: Urology;  Laterality: N/A;    Family History  Problem Relation Age of Onset  . Hyperlipidemia Brother   . Arthritis Mother   . Hyperlipidemia Sister   . Hypertension Brother   . Kidney disease Maternal Grandmother   . Stroke Father   . Parkinsonism Sister   . Heart disease Brother     Died during CABG  . Hypertension Sister   . Aneurysm Sister     Brain  . Hypertension Father     History  Substance Use Topics  . Smoking status: Never Smoker   . Smokeless tobacco: Never Used  . Alcohol Use: No    OB History   Grav Para Term Preterm Abortions TAB SAB Ect Mult Living                  Review of Systems  HENT: Positive for congestion and sinus pressure.   Respiratory: Positive for cough.   All other systems reviewed and are negative.    Allergies  Alendronate sodium; Belladonna alk-phenobarbital; Cefaclor; Cephalexin; Chlordiazepoxide; Digoxin; Hydrocodone-acetaminophen; Oxcarbazepine; Simvastatin; Streptomycin; Sulfonamide derivatives; Tetracycline; and Omeprazole  Home Medications   Current Outpatient Rx  Name  Route  Sig  Dispense  Refill  . amoxicillin (AMOXIL) 875 MG tablet   Oral   Take 875 mg by mouth 2 (two) times daily.         [name]  cholecalciferol (VITAMIN D) 1000 UNITS tablet   Oral   Take 1,000 Units by mouth daily.           . DULoxetine (CYMBALTA) 60 MG capsule   Oral   Take 60 mg by mouth daily.          [name] gabapentin (NEURONTIN) 300 MG capsule   Oral   Take 300 mg by mouth 3 (three) times daily.          Boris Lown Oil 300 MG CAPS   Oral   Take 1 capsule by mouth daily.         [name] lamoTRIgine (LAMICTAL) 100 MG tablet   Oral   Take 1 tablet (100 mg total) by mouth daily.         [name] OLANZapine (ZYPREXA) 5 MG tablet   Oral   Take 2.5 mg by mouth at bedtime. Take one half tablet by mouth at bedtime.         [name] co-enzyme  Q-10 30 MG capsule   Oral   Take 100 mg by mouth daily.           BP 140/62  Pulse 79  Temp(Src) 98.9 F (37.2 C) (Oral)  Resp 18  Ht 5' 1.5" (1.562 m)  Wt 175 lb (79.379 kg)  BMI 32.53 kg/m2  SpO2 94%  Physical Exam  Nursing note and vitals reviewed. Constitutional: She is oriented to person, place, and time. She appears well-developed and well-nourished. No distress.  HENT:  Head: Normocephalic and atraumatic.  Eyes: Conjunctivae are normal.  Neck: Normal range of motion.  Cardiovascular: Normal rate and regular rhythm.  Exam reveals no gallop and no friction rub.   No murmur heard. Pulmonary/Chest: Effort normal. She has wheezes. She has no rales. She exhibits no tenderness.  Bibasilar wheezes noted. Occasional rhonchi noted throughout bilateral lung fields.   Abdominal: Soft. There is no tenderness.  Musculoskeletal: Normal range of motion.  Neurological: She is alert and oriented to person, place, and time. Coordination normal.  Speech is goal-oriented. Moves limbs without ataxia.   Skin: Skin is warm and dry.  Psychiatric: She has a normal mood and affect. Her behavior is normal.    ED Course  Procedures (including critical care time)  Labs Reviewed - No data to display Dg Chest 2 View  01/12/2013   *RADIOLOGY REPORT*  Clinical Data: Congestion, pain.  Rule out infection.  CHEST - 2 VIEW  Comparison: None.  Findings: Heart size is normal.  There is mild perihilar bronchitic change.  Mild elevation of the right hemidiaphragm.  No focal consolidations, pleural effusions, or pulmonary edema.  There is mild mid thoracic degenerative change.  IMPRESSION:  1.  Mild bronchitic change. 2. No focal pulmonary abnormality.   Original Report Authenticated By: Norva Pavlov, M.D.     1. Sinusitis   2. Bronchitis       MDM  6:37 PM Chest xray unremarkable for pneumonia. Patient likely has sinusitis and bronchitis and will be instructed to finish her amoxicillin  prescription from her PCP and mucinex. Vitals stable and patient afebrile at this time. Patient instructed to return with worsening or concerning symptoms.         Emilia Beck, [place] 01/12/13 [phone]

## 2013-01-12 NOTE — ED Provider Notes (Signed)
Medical screening examination/treatment/procedure(s) were conducted as a shared visit with non-physician practitioner(s) and myself.  I personally evaluated the patient during the encounter  Patient seen and examined. No evidence of pneumonia. Stable for discharge  Toy Baker, MD 01/12/13 1920

## 2013-01-21 ENCOUNTER — Ambulatory Visit (INDEPENDENT_AMBULATORY_CARE_PROVIDER_SITE_OTHER): Payer: Medicare Other | Admitting: Family Medicine

## 2013-01-21 ENCOUNTER — Encounter: Payer: Self-pay | Admitting: Family Medicine

## 2013-01-21 VITALS — BP 142/82 | HR 75 | Temp 98.4°F | Wt 176.2 lb

## 2013-01-21 DIAGNOSIS — J069 Acute upper respiratory infection, unspecified: Secondary | ICD-10-CM

## 2013-01-21 MED ORDER — AMOXICILLIN-POT CLAVULANATE 875-125 MG PO TABS: 1.0000 | ORAL_TABLET | Freq: Two times a day (BID) | ORAL | Status: DC

## 2013-01-21 NOTE — Progress Notes (Signed)
She wasn't improving so she started the amoxil. She was seen at ER, CXR unremarkable. Advised to finish the amoxil. She continued to have chest and facial congestion.  Some cough, with occ sputum.  No fevers.  Tonight is the last scheduled dose of amoxil. She is likely improved from initial presentation but not fully resolved.  No ear pain. No ST.  The facial pain is better. She is still taking mucinex with plenty of water.    Meds, vitals, and allergies reviewed.   ROS: See HPI.  Otherwise, noncontributory.  GEN: nad, alert and oriented HEENT: mucous membranes moist, tm w/o erythema, nasal exam w/o erythema, clear discharge noted,  OP with cobblestoning, max sinuses ttp NECK: supple w/o LA CV: rrr.   PULM: ctab, no inc wob EXT: no edema SKIN: no acute rash

## 2013-01-21 NOTE — Patient Instructions (Addendum)
I would switch over to augmentin and continue the mucinex.  Let me know if this isn't improving.  Take care.

## 2013-01-23 NOTE — Assessment & Plan Note (Signed)
Nontoxic, but sinuses still ttp. Would change to augmentin and f/u prn.  She agrees.  D/w pt. Supportive tx o/w .

## 2013-01-28 ENCOUNTER — Encounter: Payer: Self-pay | Admitting: Family Medicine

## 2013-01-28 NOTE — Progress Notes (Signed)
Info from pt's husband- he notified me about this at his OV today.   Wife (pt) is improved from cough, but she had diarrhea, a raw sensation in mouth, and skin irritation on the buttock on augmentin, tolerated plain amoxil. Cough is improved and she feels better.  I updated intolerance list.  She can fu prn.

## 2013-02-03 DIAGNOSIS — N3946 Mixed incontinence: Secondary | ICD-10-CM | POA: Diagnosis not present

## 2013-02-03 DIAGNOSIS — R350 Frequency of micturition: Secondary | ICD-10-CM | POA: Diagnosis not present

## 2013-02-03 DIAGNOSIS — R32 Unspecified urinary incontinence: Secondary | ICD-10-CM | POA: Diagnosis not present

## 2013-02-11 DIAGNOSIS — F329 Major depressive disorder, single episode, unspecified: Secondary | ICD-10-CM | POA: Diagnosis not present

## 2013-02-11 DIAGNOSIS — F3289 Other specified depressive episodes: Secondary | ICD-10-CM | POA: Diagnosis not present

## 2013-02-11 DIAGNOSIS — F068 Other specified mental disorders due to known physiological condition: Secondary | ICD-10-CM | POA: Diagnosis not present

## 2013-03-09 ENCOUNTER — Other Ambulatory Visit: Payer: Self-pay | Admitting: Family Medicine

## 2013-03-09 DIAGNOSIS — E7800 Pure hypercholesterolemia, unspecified: Secondary | ICD-10-CM

## 2013-03-17 ENCOUNTER — Other Ambulatory Visit (INDEPENDENT_AMBULATORY_CARE_PROVIDER_SITE_OTHER): Payer: Medicare Other

## 2013-03-17 DIAGNOSIS — E7800 Pure hypercholesterolemia, unspecified: Secondary | ICD-10-CM | POA: Diagnosis not present

## 2013-03-17 LAB — COMPREHENSIVE METABOLIC PANEL
ALT: 26 U/L (ref 0–35)
AST: 30 U/L (ref 0–37)
Alkaline Phosphatase: 76 U/L (ref 39–117)
Glucose, Bld: 99 mg/dL (ref 70–99)
Potassium: 4 mEq/L (ref 3.5–5.1)
Sodium: 140 mEq/L (ref 135–145)
Total Bilirubin: 0.9 mg/dL (ref 0.3–1.2)
Total Protein: 7.3 g/dL (ref 6.0–8.3)

## 2013-03-17 LAB — LDL CHOLESTEROL, DIRECT: Direct LDL: 180.8 mg/dL

## 2013-03-17 LAB — LIPID PANEL
Cholesterol: 258 mg/dL — ABNORMAL HIGH (ref 0–200)
Total CHOL/HDL Ratio: 4
VLDL: 17.6 mg/dL (ref 0.0–40.0)

## 2013-03-24 ENCOUNTER — Ambulatory Visit (INDEPENDENT_AMBULATORY_CARE_PROVIDER_SITE_OTHER): Payer: Medicare Other | Admitting: Family Medicine

## 2013-03-24 ENCOUNTER — Encounter: Payer: Self-pay | Admitting: Family Medicine

## 2013-03-24 VITALS — BP 124/84 | HR 78 | Temp 98.4°F | Ht 61.0 in | Wt 176.8 lb

## 2013-03-24 DIAGNOSIS — R439 Unspecified disturbances of smell and taste: Secondary | ICD-10-CM

## 2013-03-24 DIAGNOSIS — E785 Hyperlipidemia, unspecified: Secondary | ICD-10-CM | POA: Diagnosis not present

## 2013-03-24 DIAGNOSIS — F3289 Other specified depressive episodes: Secondary | ICD-10-CM

## 2013-03-24 DIAGNOSIS — F329 Major depressive disorder, single episode, unspecified: Secondary | ICD-10-CM

## 2013-03-24 DIAGNOSIS — Z0000 Encounter for general adult medical examination without abnormal findings: Secondary | ICD-10-CM

## 2013-03-24 NOTE — Assessment & Plan Note (Signed)
She'll try nasal saline and we may have to refer to ENT if not improved.  nonrevealing exam. She agrees with plan.

## 2013-03-24 NOTE — Patient Instructions (Addendum)
I would get a flu shot each fall.   Use nasal saline a few times a day and if the taste changes don't improve, then let me know.   If you would consider treatment for osteoporosis, then we can set up a bone density test.  Take care.

## 2013-03-24 NOTE — Progress Notes (Signed)
I have personally reviewed the Medicare Annual Wellness questionnaire and have noted 1. The patient's medical and social history 2. Their use of alcohol, tobacco or illicit drugs 3. Their current medications and supplements 4. The patient's functional ability including ADL's, fall risks, home safety risks and hearing or visual             impairment. 5. Diet and physical activities 6. Evidence for depression or mood disorders  The patients weight, height, BMI have been recorded in the chart and visual acuity is per eye clinic.  I have made referrals, counseling and provided education to the patient based review of the above and I have provided the pt with a written personalized care plan for preventive services.  See scanned forms.  Routine anticipatory guidance given to patient.  See health maintenance. Flu 2013 Shingles 2009 PNA 2000 Tetanus 2005 Colonoscopy 2005 Breast cancer screening 2013 DXA d/w pt.  She'll consider.   Advance directive dw pt.  Would have husband designated if incapacitated.  Cognitive function addressed- see scanned forms- and if abnormal then additional documentation follows.   Taste and smell changes affected since URI.  No current URI sx.  No head trauma.  No FCNAV now, no rhinorrhea, no facial pain.   MDD per psych.   Elevated Cholesterol: Statin intolerant Muscle aches: not currently Diet compliance: discussed Exercise: discussed  PMH and SH reviewed  Meds, vitals, and allergies reviewed.   ROS: See HPI.  Otherwise negative.    GEN: nad, alert and oriented HEENT: mucous membranes moist, tm wnl, nasal and OP exam wnl NECK: supple w/o LA CV: rrr. PULM: ctab, no inc wob ABD: soft, +bs EXT: no edema SKIN: no acute rash

## 2013-03-24 NOTE — Assessment & Plan Note (Signed)
See scanned forms.  Routine anticipatory guidance given to patient.  See health maintenance. Flu 2013 Shingles 2009 PNA 2000 Tetanus 2005 Colonoscopy 2005 Breast cancer screening 2013 DXA d/w pt.  She'll consider.   Advance directive dw pt.  Would have husband designated if incapacitated.  Cognitive function addressed- see scanned forms- and if abnormal then additional documentation follows.

## 2013-03-24 NOTE — Assessment & Plan Note (Signed)
Per Psych clinic.

## 2013-03-24 NOTE — Assessment & Plan Note (Signed)
Statin intolerant, she'll work on diet and exercise, weight.

## 2013-03-27 DIAGNOSIS — N3946 Mixed incontinence: Secondary | ICD-10-CM | POA: Diagnosis not present

## 2013-03-27 DIAGNOSIS — R350 Frequency of micturition: Secondary | ICD-10-CM | POA: Diagnosis not present

## 2013-04-17 ENCOUNTER — Encounter (HOSPITAL_COMMUNITY): Payer: Self-pay | Admitting: Emergency Medicine

## 2013-04-17 ENCOUNTER — Emergency Department (HOSPITAL_COMMUNITY): Payer: Medicare Other

## 2013-04-17 ENCOUNTER — Emergency Department (HOSPITAL_COMMUNITY)
Admission: EM | Admit: 2013-04-17 | Discharge: 2013-04-17 | Disposition: A | Discharge status: Home / Self Care | Payer: Medicare Other | Attending: Emergency Medicine | Admitting: Emergency Medicine

## 2013-04-17 DIAGNOSIS — S335XXA Sprain of ligaments of lumbar spine, initial encounter: Secondary | ICD-10-CM | POA: Diagnosis not present

## 2013-04-17 DIAGNOSIS — Z8669 Personal history of other diseases of the nervous system and sense organs: Secondary | ICD-10-CM | POA: Diagnosis not present

## 2013-04-17 DIAGNOSIS — F3289 Other specified depressive episodes: Secondary | ICD-10-CM | POA: Insufficient documentation

## 2013-04-17 DIAGNOSIS — Y929 Unspecified place or not applicable: Secondary | ICD-10-CM | POA: Insufficient documentation

## 2013-04-17 DIAGNOSIS — IMO0001 Reserved for inherently not codable concepts without codable children: Secondary | ICD-10-CM | POA: Insufficient documentation

## 2013-04-17 DIAGNOSIS — Z79899 Other long term (current) drug therapy: Secondary | ICD-10-CM | POA: Insufficient documentation

## 2013-04-17 DIAGNOSIS — S39012A Strain of muscle, fascia and tendon of lower back, initial encounter: Secondary | ICD-10-CM

## 2013-04-17 DIAGNOSIS — Y939 Activity, unspecified: Secondary | ICD-10-CM | POA: Insufficient documentation

## 2013-04-17 DIAGNOSIS — Z87440 Personal history of urinary (tract) infections: Secondary | ICD-10-CM | POA: Diagnosis not present

## 2013-04-17 DIAGNOSIS — M47817 Spondylosis without myelopathy or radiculopathy, lumbosacral region: Secondary | ICD-10-CM | POA: Diagnosis not present

## 2013-04-17 DIAGNOSIS — M129 Arthropathy, unspecified: Secondary | ICD-10-CM | POA: Diagnosis not present

## 2013-04-17 DIAGNOSIS — R209 Unspecified disturbances of skin sensation: Secondary | ICD-10-CM | POA: Diagnosis not present

## 2013-04-17 DIAGNOSIS — M545 Low back pain, unspecified: Secondary | ICD-10-CM

## 2013-04-17 DIAGNOSIS — Z8719 Personal history of other diseases of the digestive system: Secondary | ICD-10-CM | POA: Diagnosis not present

## 2013-04-17 DIAGNOSIS — Z8739 Personal history of other diseases of the musculoskeletal system and connective tissue: Secondary | ICD-10-CM | POA: Diagnosis not present

## 2013-04-17 DIAGNOSIS — Z862 Personal history of diseases of the blood and blood-forming organs and certain disorders involving the immune mechanism: Secondary | ICD-10-CM | POA: Diagnosis not present

## 2013-04-17 DIAGNOSIS — Z7982 Long term (current) use of aspirin: Secondary | ICD-10-CM | POA: Diagnosis not present

## 2013-04-17 DIAGNOSIS — F329 Major depressive disorder, single episode, unspecified: Secondary | ICD-10-CM | POA: Diagnosis not present

## 2013-04-17 DIAGNOSIS — X58XXXA Exposure to other specified factors, initial encounter: Secondary | ICD-10-CM | POA: Insufficient documentation

## 2013-04-17 DIAGNOSIS — Z8639 Personal history of other endocrine, nutritional and metabolic disease: Secondary | ICD-10-CM | POA: Insufficient documentation

## 2013-04-17 LAB — URINALYSIS W MICROSCOPIC + REFLEX CULTURE
Bilirubin Urine: NEGATIVE
Hgb urine dipstick: NEGATIVE
Nitrite: NEGATIVE
Protein, ur: NEGATIVE mg/dL
Urobilinogen, UA: 0.2 mg/dL (ref 0.0–1.0)

## 2013-04-17 MED ORDER — HYDROMORPHONE HCL PF 1 MG/ML IJ SOLN
0.5000 mg | Freq: Once | INTRAMUSCULAR | Status: AC
  Administered 2013-04-17: 0.5 mg via INTRAMUSCULAR
  Filled 2013-04-17: qty 1

## 2013-04-17 MED ORDER — TRAMADOL HCL 50 MG PO TABS: 50.0000 mg | ORAL_TABLET | Freq: Four times a day (QID) | ORAL | Status: DC | PRN

## 2013-04-17 NOTE — ED Provider Notes (Signed)
CSN: [id number]     Arrival date & time 04/17/13  1308 History   First MD Initiated Contact with Patient 04/17/13 [phone]     Chief Complaint  Patient presents with  . Back Pain   (Consider location/radiation/quality/duration/timing/severity/associated sxs/prior Treatment) Patient is a 79 y.o. female presenting with back pain. The history is provided by the patient.  Back Pain Location:  Lumbar spine (paraspinal) Quality:  Aching Radiates to:  L foot Pain severity:  Moderate Pain is:  Same all the time Onset quality:  Gradual Duration:  3 days Timing:  Constant Progression:  Unchanged Chronicity: acute on chronic. Relieved by:  Nothing Worsened by:  Movement Ineffective treatments: hydrocodone from an old surgery. Associated symptoms: paresthesias   Associated symptoms: no abdominal pain, no chest pain, no dysuria, no fever, no headaches, no perianal numbness and no weakness     Past Medical History  Diagnosis Date  . Hyperlipidemia   . GERD (gastroesophageal reflux disease)   . Osteoporosis   . Arthritis   . Fibromyalgia   . UTI (lower urinary tract infection)   . Urinary incontinence   . Depression 1993, 2000  . Blood transfusion without reported diagnosis   . OSA (obstructive sleep apnea)     dx 2010-had a cpap-does not use-   Past Surgical History  Procedure Laterality Date  . Cardiovascular stress test  2009    Normal, Dr. Mayford Knife  . Breast biopsy  1995    x 2  . Abdominal hysterectomy  1976  . Back fusions  1977    missing vertebra  . Childbirth  [phone]  . Ovarian cyst removal  1964  . Appendectomy  1964  . Dilatation and currettage  1964  . Cataract extraction, bilateral Bilateral 2005  . Breast biopsy  07/10/2012    Procedure: BREAST BIOPSY WITH NEEDLE LOCALIZATION;  Surgeon: Emelia Loron, MD;  Location: [facility];  Service: General;  Laterality: Right;  . Cystoscopy N/A 11/25/2012    Procedure: CYSTOSCOPY;  Surgeon: Martina Sinner, MD;  Location: [facility];  Service: Urology;  Laterality: N/A;  . Pubovaginal sling N/A 11/25/2012    Procedure: Leonides Grills;  Surgeon: Martina Sinner, MD;  Location: [facility];  Service: Urology;  Laterality: N/A;   Family History  Problem Relation Age of Onset  . Arthritis Mother   . Kidney disease Maternal Grandmother   . Stroke Father   . Hypertension Father   . Heart disease Brother     Died during CABG  . Hypertension Sister   . Dementia Sister   . Aneurysm Sister     Brain  . Parkinsonism Sister   . Colon cancer Neg Hx   . Breast cancer Neg Hx    History  Substance Use Topics  . Smoking status: Never Smoker   . Smokeless tobacco: Never Used  . Alcohol Use: No   OB History   Grav Para Term Preterm Abortions TAB SAB Ect Mult Living                 Review of Systems  Constitutional: Negative for fever and fatigue.  HENT: Negative for congestion, drooling and neck pain.   Eyes: Negative for pain.  Respiratory: Negative for cough and shortness of breath.   Cardiovascular: Negative for chest pain.  Gastrointestinal: Negative for nausea, vomiting, abdominal pain and diarrhea.  Genitourinary: Negative for dysuria and hematuria.  Musculoskeletal: Positive for back pain. Negative for gait problem.  Skin: Negative for color change.  Neurological: Positive for paresthesias. Negative for dizziness, weakness and headaches.  Hematological: Negative for adenopathy.  Psychiatric/Behavioral: Negative for behavioral problems.  All other systems reviewed and are negative.    Allergies  Alendronate sodium; Belladonna alk-phenobarbital; Cefaclor; Cephalexin; Chlordiazepoxide; Clavulanic acid; Digoxin; Hydrocodone-acetaminophen; Oxcarbazepine; Simvastatin; Streptomycin; Sulfonamide derivatives; Tetracycline; and Omeprazole  Home Medications   Current Outpatient Rx  Name  Route  Sig  Dispense  Refill  . aspirin 81 MG  tablet   Oral   Take 81 mg by mouth daily.         . cholecalciferol (VITAMIN D) 1000 UNITS tablet   Oral   Take 1,000 Units by mouth daily.           [name] co-enzyme Q-10 30 MG capsule   Oral   Take 100 mg by mouth daily.         . DULoxetine (CYMBALTA) 60 MG capsule   Oral   Take 60 mg by mouth daily.          . folic acid (FOLVITE) 800 MCG tablet   Oral   Take 600 mcg by mouth daily.         [name] gabapentin (NEURONTIN) 300 MG capsule   Oral   Take 300 mg by mouth 3 (three) times daily.          Boris Lown Oil 300 MG CAPS   Oral   Take 1 capsule by mouth daily.         [name] lamoTRIgine (LAMICTAL) 100 MG tablet   Oral   Take 1 tablet (100 mg total) by mouth daily.         [name] OLANZapine (ZYPREXA) 5 MG tablet   Oral   Take 2.5 mg by mouth at bedtime. Take one half tablet by mouth at bedtime.          There were no vitals taken for this visit. Physical Exam  Nursing note and vitals reviewed. Constitutional: She is oriented to person, place, and time. She appears well-developed and well-nourished.  HENT:  Head: Normocephalic.  Mouth/Throat: No oropharyngeal exudate.  Eyes: Conjunctivae and EOM are normal. Pupils are equal, round, and reactive to light.  Neck: Normal range of motion. Neck supple.  Cardiovascular: Normal rate, regular rhythm, normal heart sounds and intact distal pulses.  Exam reveals no gallop and no friction rub.   No murmur heard. Pulmonary/Chest: Effort normal and breath sounds normal. No respiratory distress. She has no wheezes.  Abdominal: Soft. Bowel sounds are normal. There is no tenderness. There is no rebound and no guarding.  Musculoskeletal: Normal range of motion. She exhibits tenderness (mild to mod ttp of left lower lumbar paraspinal area and upper buttock). She exhibits no edema.  Neurological: She is alert and oriented to person, place, and time.  Reflex Scores:      Patellar reflexes are 2+ on the right side and 1+ on the left  side.      Achilles reflexes are 2+ on the right side and 1+ on the left side. Normal strength in bilateral LE's. Pt notes mild tingling/numbness sensation in left medial thigh and calf. 2+ distal pulses. Pt is ambulatory.   Skin: Skin is warm and dry.  Psychiatric: She has a normal mood and affect. Her behavior is normal.    ED Course  Procedures (including critical care time) Labs Review Labs Reviewed  URINALYSIS W MICROSCOPIC + REFLEX CULTURE - Abnormal; Notable for the following:  APPearance CLOUDY (*)    Leukocytes, UA MODERATE (*)    Squamous Epithelial / LPF MANY (*)    All other components within normal limits   Imaging Review Dg Lumbar Spine Complete  04/17/2013   *RADIOLOGY REPORT*  Clinical Data: Low back pain radiating into both legs.  LUMBAR SPINE - COMPLETE 4+ VIEW  Comparison: Plain films lumbar spine 07/04/2010 and MRI lumbar spine 07/13/2010.  Findings: There is unchanged 1.2 cm anterolisthesis of L5 on S1 due to bilateral pars interarticularis defect.  The patient is status post L5-S1 decompression.  Alignment is otherwise normal. Vertebral body height is maintained.  Multilevel loss of disc space height appearing worst at L2-3 is stable in appearance.  IMPRESSION:  1.  No acute finding. 2.  Unchanged anterolisthesis of L5 on S1 due to pars defects.  The patient is status post posterior decompression at this level. 3.  Multilevel degenerative disease appearing worst at L2-3 without marked interval change.   Original Report Authenticated By: Holley Dexter, M.D.    MDM  No diagnosis found. 9:49 AM 79 y.o. female w hx of lumbar fusion pw exacerbation of chronic back pain. Pt AFVSS here, has paresthesias in left medial calf and thigh, is ambulatory, no back pain red flags. Good story for back strain when pt was trying to hang a heavy picture and also when carrying a garden hose recently. Will get pain control, UA, xr lumbar.   12:09 PM: Pt feeling better. I have discussed  the diagnosis/risks/treatment options with the patient and believe the pt to be eligible for discharge home to follow-up with pcp in 5-6 days if no better. We also discussed returning to the ED immediately if new or worsening sx occur. We discussed the sx which are most concerning (e.g., worsening pain, back pain red flags) that necessitate immediate return. Any new prescriptions provided to the patient are listed below.  Discharge Medication List as of 04/17/2013 12:10 PM    START taking these medications   Details  traMADol (ULTRAM) 50 MG tablet Take 1 tablet (50 mg total) by mouth every 6 (six) hours as needed for pain., Starting 04/17/2013, Until Discontinued, Print         Junius Argyle, MD 04/17/13 2030

## 2013-04-17 NOTE — ED Notes (Addendum)
Pt presenting to ed with c/o back pain and radiating pain into her left leg and left foot. Pt states her medications are not helping with pain pt states she has been taking hydrocodone

## 2013-04-20 ENCOUNTER — Encounter (HOSPITAL_COMMUNITY): Payer: Self-pay

## 2013-04-20 ENCOUNTER — Emergency Department (HOSPITAL_COMMUNITY)
Admission: EM | Admit: 2013-04-20 | Discharge: 2013-04-20 | Disposition: A | Discharge status: Home / Self Care | Payer: Medicare Other | Attending: Emergency Medicine | Admitting: Emergency Medicine

## 2013-04-20 DIAGNOSIS — F329 Major depressive disorder, single episode, unspecified: Secondary | ICD-10-CM | POA: Diagnosis not present

## 2013-04-20 DIAGNOSIS — F3289 Other specified depressive episodes: Secondary | ICD-10-CM | POA: Insufficient documentation

## 2013-04-20 DIAGNOSIS — G8911 Acute pain due to trauma: Secondary | ICD-10-CM | POA: Diagnosis not present

## 2013-04-20 DIAGNOSIS — G8929 Other chronic pain: Secondary | ICD-10-CM | POA: Diagnosis not present

## 2013-04-20 DIAGNOSIS — Z862 Personal history of diseases of the blood and blood-forming organs and certain disorders involving the immune mechanism: Secondary | ICD-10-CM | POA: Diagnosis not present

## 2013-04-20 DIAGNOSIS — M129 Arthropathy, unspecified: Secondary | ICD-10-CM | POA: Insufficient documentation

## 2013-04-20 DIAGNOSIS — M545 Low back pain, unspecified: Secondary | ICD-10-CM | POA: Insufficient documentation

## 2013-04-20 DIAGNOSIS — Z8639 Personal history of other endocrine, nutritional and metabolic disease: Secondary | ICD-10-CM | POA: Insufficient documentation

## 2013-04-20 DIAGNOSIS — S39012D Strain of muscle, fascia and tendon of lower back, subsequent encounter: Secondary | ICD-10-CM

## 2013-04-20 DIAGNOSIS — G4733 Obstructive sleep apnea (adult) (pediatric): Secondary | ICD-10-CM | POA: Insufficient documentation

## 2013-04-20 DIAGNOSIS — S335XXA Sprain of ligaments of lumbar spine, initial encounter: Secondary | ICD-10-CM | POA: Diagnosis not present

## 2013-04-20 DIAGNOSIS — Z87440 Personal history of urinary (tract) infections: Secondary | ICD-10-CM | POA: Insufficient documentation

## 2013-04-20 DIAGNOSIS — Z8719 Personal history of other diseases of the digestive system: Secondary | ICD-10-CM | POA: Diagnosis not present

## 2013-04-20 DIAGNOSIS — Z7982 Long term (current) use of aspirin: Secondary | ICD-10-CM | POA: Diagnosis not present

## 2013-04-20 DIAGNOSIS — R209 Unspecified disturbances of skin sensation: Secondary | ICD-10-CM | POA: Diagnosis not present

## 2013-04-20 DIAGNOSIS — IMO0002 Reserved for concepts with insufficient information to code with codable children: Secondary | ICD-10-CM | POA: Diagnosis not present

## 2013-04-20 DIAGNOSIS — Z79899 Other long term (current) drug therapy: Secondary | ICD-10-CM | POA: Diagnosis not present

## 2013-04-20 DIAGNOSIS — R202 Paresthesia of skin: Secondary | ICD-10-CM

## 2013-04-20 MED ORDER — DIAZEPAM 5 MG PO TABS
5.0000 mg | ORAL_TABLET | Freq: Once | ORAL | Status: AC
  Administered 2013-04-20: 5 mg via ORAL
  Filled 2013-04-20: qty 1

## 2013-04-20 MED ORDER — DIAZEPAM 5 MG PO TABS: 5.0000 mg | ORAL_TABLET | Freq: Three times a day (TID) | ORAL | Status: DC | PRN

## 2013-04-20 MED ORDER — HYDROMORPHONE HCL PF 1 MG/ML IJ SOLN
1.0000 mg | Freq: Once | INTRAMUSCULAR | Status: AC
  Administered 2013-04-20: 1 mg via INTRAMUSCULAR
  Filled 2013-04-20: qty 1

## 2013-04-20 MED ORDER — PREDNISONE 20 MG PO TABS: 40.0000 mg | ORAL_TABLET | Freq: Every day | ORAL | Status: DC

## 2013-04-20 MED ORDER — HYDROCODONE-ACETAMINOPHEN 5-325 MG PO TABS: ORAL_TABLET | ORAL | Status: DC

## 2013-04-20 NOTE — ED Notes (Signed)
Pt did not arrive with a catheter or incisions on 04/20/13.

## 2013-04-20 NOTE — ED Notes (Addendum)
Pt c/o lower left back pain intermittently for several days.  Pt states she has had lower left back pain "for years," but worsening over past several days.  Pt is able to ambulate.  Pt c/o numbness in right thigh, extending down right leg to mid-calf.  Pt denies loss of bowel/bladder control.

## 2013-04-20 NOTE — Discharge Instructions (Signed)
Paresthesia °Paresthesia is an abnormal burning or prickling sensation. This sensation is generally felt in the hands, arms, legs, or feet. However, it may occur in any part of the body. It is usually not painful. The feeling may be described as: °· Tingling or numbness. °· "Pins and needles." °· Skin crawling. °· Buzzing. °· Limbs "falling asleep." °· Itching. °Most people experience temporary (transient) paresthesia at some time in their lives. °CAUSES  °Paresthesia may occur when you breathe too quickly (hyperventilation). It can also occur without any apparent cause. Commonly, paresthesia occurs when pressure is placed on a nerve. The feeling quickly goes away once the pressure is removed. For some people, however, paresthesia is a long-lasting (chronic) condition caused by an underlying disorder. The underlying disorder may be: °· A traumatic, direct injury to nerves. Examples include a: °· Broken (fractured) neck. °· Fractured skull. °· A disorder affecting the brain and spinal cord (central nervous system). Examples include: °· Transverse myelitis. °· Encephalitis. °· Transient ischemic attack. °· Multiple sclerosis. °· Stroke. °· Tumor or blood vessel problems, such as an arteriovenous malformation pressing against the brain or spinal cord. °· A condition that damages the peripheral nerves (peripheral neuropathy). Peripheral nerves are not part of the brain and spinal cord. These conditions include: °· Diabetes. °· Peripheral vascular disease. °· Nerve entrapment syndromes, such as carpal tunnel syndrome. °· Shingles. °· Hypothyroidism. °· Vitamin B12 deficiencies. °· Alcoholism. °· Heavy metal poisoning (lead, arsenic). °· Rheumatoid arthritis. °· Systemic lupus erythematosus. °DIAGNOSIS  °Your caregiver will attempt to find the underlying cause of your paresthesia. Your caregiver may: °· Take your medical history. °· Perform a physical exam. °· Order various lab tests. °· Order imaging tests. °TREATMENT    °Treatment for paresthesia depends on the underlying cause. °HOME CARE INSTRUCTIONS °· Avoid drinking alcohol. °· You may consider massage or acupuncture to help relieve your symptoms. °· Keep all follow-up appointments as directed by your caregiver. °SEEK IMMEDIATE MEDICAL CARE IF:  °· You feel weak. °· You have trouble walking or moving. °· You have problems with speech or vision. °· You feel confused. °· You cannot control your bladder or bowel movements. °· You feel numbness after an injury. °· You faint. °· Your burning or prickling feeling gets worse when walking. °· You have pain, cramps, or dizziness. °· You develop a rash. °MAKE SURE YOU: °· Understand these instructions. °· Will watch your condition. °· Will get help right away if you are not doing well or get worse. °Document Released: 07/28/2002 Document Revised: 10/30/2011 Document Reviewed: 04/28/2011 °ExitCare® Patient Information ©2014 [employer]. ° °

## 2013-04-20 NOTE — ED Provider Notes (Signed)
CSN: [id number]     Arrival date & time 04/20/13  4098 History   First MD Initiated Contact with Patient 04/20/13 [phone]     Chief Complaint  Patient presents with  . Back Pain   (Consider location/radiation/quality/duration/timing/severity/associated sxs/prior Treatment) HPI Comments: Pt has history of chronic low back pain, is receiving occasional injections by Dr. Ethelene Hal with [facility]. Her last injection was approximately 2 years ago and has been doing well for her. She also was evaluated by Dr. Newell Coral in 2011, had her last MRI of her back was told no surgical intervention was indicated at that time. She reports approximately one week ago, she did take a picture off the wall and had climbed onto a stool to try to place a different position and had difficulty while she was bent forward. A few days afterwards, the pain began getting worse gradually and is now associated with a numbness sensation going down the medial side of her left leg. She was seen in the ED earlier this week, given a half a shot of "morphine" and given a prescription for tramadol. She reports she tried that and was not helping. She did have a prior prescription of hydrocodone and has been taking half a tablet every 4 hours which was helping some, but now is no longer helping. She reports she has not been able to sleep due to the pain for the last couple of nights. She called [facility], but does not have an appointment availability until 3 weeks later.  No urinary difficulty or incontinence. No fevers or chills. She is able to ambulate. Bending and twisting of her torso worsens pain. She reports she is unable to get comfortable. She reports that her allergy is to oxycodone which she thinks mixed with her prior medications, she had a bad reaction to. In addition she has been on Valium in the past more for anxiety purposes than muscle relaxation.  She is taking some motrin as well.    Patient is a 79 y.o. female  presenting with back pain. The history is provided by the patient, the spouse and medical records.  Back Pain Location:  Lumbar spine Quality:  Stiffness and aching Stiffness is present:  All day Radiates to:  L thigh and L knee Pain severity:  Severe Pain is:  Same all the time Onset quality:  Gradual Duration:  1 week Timing:  Constant Progression:  Worsening Chronicity:  Chronic Relieved by:  Nothing Worsened by:  Ambulation, bending, coughing and movement Ineffective treatments:  Ibuprofen, narcotics, bed rest, heating pad and being still Associated symptoms: leg pain and numbness   Associated symptoms: no abdominal pain, no bladder incontinence, no bowel incontinence, no fever, no headaches, no perianal numbness, no tingling and no weakness     Past Medical History  Diagnosis Date  . Hyperlipidemia   . GERD (gastroesophageal reflux disease)   . Osteoporosis   . Arthritis   . Fibromyalgia   . UTI (lower urinary tract infection)   . Urinary incontinence   . Depression 1993, 2000  . Blood transfusion without reported diagnosis   . OSA (obstructive sleep apnea)     dx 2010-had a cpap-does not use-   Past Surgical History  Procedure Laterality Date  . Cardiovascular stress test  2009    Normal, Dr. Mayford Knife  . Breast biopsy  1995    x 2  . Abdominal hysterectomy  1976  . Back fusions  1977    missing vertebra  .  Childbirth  [phone]  . Ovarian cyst removal  1964  . Appendectomy  1964  . Dilatation and currettage  1964  . Cataract extraction, bilateral Bilateral 2005  . Breast biopsy  07/10/2012    Procedure: BREAST BIOPSY WITH NEEDLE LOCALIZATION;  Surgeon: Emelia Loron, MD;  Location: [facility];  Service: General;  Laterality: Right;  . Cystoscopy N/A 11/25/2012    Procedure: CYSTOSCOPY;  Surgeon: Martina Sinner, MD;  Location: [facility];  Service: Urology;  Laterality: N/A;  . Pubovaginal sling N/A 11/25/2012     Procedure: Leonides Grills;  Surgeon: Martina Sinner, MD;  Location: [facility];  Service: Urology;  Laterality: N/A;   Family History  Problem Relation Age of Onset  . Arthritis Mother   . Kidney disease Maternal Grandmother   . Stroke Father   . Hypertension Father   . Heart disease Brother     Died during CABG  . Hypertension Sister   . Dementia Sister   . Aneurysm Sister     Brain  . Parkinsonism Sister   . Colon cancer Neg Hx   . Breast cancer Neg Hx    History  Substance Use Topics  . Smoking status: Never Smoker   . Smokeless tobacco: Never Used  . Alcohol Use: No   OB History   Grav Para Term Preterm Abortions TAB SAB Ect Mult Living                 Review of Systems  Constitutional: Negative for fever.  HENT: Negative for neck pain and neck stiffness.   Gastrointestinal: Negative for nausea, vomiting, abdominal pain, diarrhea and bowel incontinence.  Genitourinary: Negative for bladder incontinence, decreased urine volume and difficulty urinating.  Musculoskeletal: Positive for back pain.  Skin: Negative for rash and wound.  Neurological: Positive for numbness. Negative for dizziness, tingling, weakness and headaches.  All other systems reviewed and are negative.    Allergies  Alendronate sodium; Belladonna alk-phenobarbital; Cefaclor; Cephalexin; Chlordiazepoxide; Clavulanic acid; Digoxin; Hydrocodone-acetaminophen; Oxcarbazepine; Simvastatin; Streptomycin; Sulfonamide derivatives; Tetracycline; and Omeprazole  Home Medications   Current Outpatient Rx  Name  Route  Sig  Dispense  Refill  . aspirin EC 81 MG tablet   Oral   Take 81 mg by mouth at bedtime.         . cholecalciferol (VITAMIN D) 1000 UNITS tablet   Oral   Take 1,000 Units by mouth daily.           [name] co-enzyme Q-10 30 MG capsule   Oral   Take 30 mg by mouth daily.          . DULoxetine (CYMBALTA) 60 MG capsule   Oral   Take 60 mg by mouth every morning.           . folic acid (FOLVITE) 800 MCG tablet   Oral   Take 800 mcg by mouth daily.          [name] gabapentin (NEURONTIN) 300 MG capsule   Oral   Take 300 mg by mouth 3 (three) times daily.          [name] HYDROcodone-acetaminophen (NORCO/VICODIN) 5-325 MG per tablet   Oral   Take 1 tablet by mouth every 8 (eight) hours as needed for pain.         [name] ibuprofen (ADVIL,MOTRIN) 200 MG tablet   Oral   Take 400 mg by mouth every 8 (eight) hours as needed for pain.         [name]  lamoTRIgine (LAMICTAL) 100 MG tablet   Oral   Take 100 mg by mouth at bedtime.         [name] OLANZapine (ZYPREXA) 5 MG tablet   Oral   Take 2.5 mg by mouth at bedtime.          [name] omega-3 acid ethyl esters (LOVAZA) 1 G capsule   Oral   Take 1 g by mouth daily.         . diazepam (VALIUM) 5 MG tablet   Oral   Take 1 tablet (5 mg total) by mouth every 8 (eight) hours as needed (muscle spasms).   15 tablet   0   . HYDROcodone-acetaminophen (NORCO/VICODIN) 5-325 MG per tablet      1/2 - 2 tablets po q 6 hours prn moderate to severe pain   20 tablet   0   . predniSONE (DELTASONE) 20 MG tablet   Oral   Take 2 tablets (40 mg total) by mouth daily.   14 tablet   0    BP 163/68  Pulse 87  Temp(Src) 98.4 F (36.9 C) (Oral)  Resp 16  Ht 5' 1.5" (1.562 m)  Wt 175 lb (79.379 kg)  BMI 32.53 kg/m2  SpO2 93% Physical Exam  Nursing note and vitals reviewed. Constitutional: She appears well-developed and well-nourished.  HENT:  Head: Normocephalic and atraumatic.  Neck: Normal range of motion. Neck supple.  Cardiovascular: Intact distal pulses.   Pulmonary/Chest: Effort normal.  Abdominal: Soft. She exhibits no distension. There is no tenderness. There is no rebound.  Musculoskeletal:       Lumbar back: She exhibits tenderness, pain and spasm. She exhibits no deformity, no laceration and normal pulse.       Back:  Neurological: She is alert. She displays no tremor and normal reflexes. No sensory  deficit. She exhibits normal muscle tone. Coordination normal.  Reflex Scores:      Patellar reflexes are 2+ on the right side and 2+ on the left side. Skin: Skin is warm and dry.  Psychiatric: She has a normal mood and affect.    ED Course  Procedures (including critical care time) Labs Review Labs Reviewed - No data to display Imaging Review No results found.  ra sat is 93% and I interpret to be adequate  10:50 AM Pt is given IM dilaudid, I have given Rx for valium and prednisone, ordered outpt MRI with instructions to forward to [place] and Dr. Ethelene Hal.  I spoke to Dr. Shon Baton today who will pass on message for Dr. Ethelene Hal to try to follow up with pt on Tuesday and get her seen sooner.      MDM   1. Lumbar strain, subsequent encounter   2. Left leg paresthesias      Pt with most likely musculoskeletal strain, however new numbness and paresthesia going down left medial thigh and leg.  I reviewed MRI results from 2011 and 2010.  Pt likely needs repeat MRI if anti-inflammatories are not improving symptoms.  Will give muscle relaxant, also recommend prednisone and will contact [place]  To see pt sooner, arrange for outpt MRI.        Gavin Pound. Darianny Momon, MD 04/20/13 1054

## 2013-04-20 NOTE — ED Notes (Signed)
Patient reports that she has low back pain that is worse. Patient states her pain has gotten worse since she visited the ED last week. Patient states she received prescriptions, but has had no relief. Patient also states thenumbness continues on the inner thigh and calf area of the left leg.

## 2013-04-22 ENCOUNTER — Ambulatory Visit: Payer: Medicare Other | Admitting: Family Medicine

## 2013-04-22 ENCOUNTER — Ambulatory Visit (HOSPITAL_COMMUNITY)
Admission: RE | Admit: 2013-04-22 | Discharge: 2013-04-22 | Disposition: A | Discharge status: Home / Self Care | Payer: Medicare Other | Source: Ambulatory Visit | Attending: Emergency Medicine | Admitting: Emergency Medicine

## 2013-04-22 DIAGNOSIS — M5137 Other intervertebral disc degeneration, lumbosacral region: Secondary | ICD-10-CM | POA: Diagnosis not present

## 2013-04-22 DIAGNOSIS — M4310 Spondylolisthesis, site unspecified: Secondary | ICD-10-CM | POA: Diagnosis not present

## 2013-04-22 DIAGNOSIS — M51379 Other intervertebral disc degeneration, lumbosacral region without mention of lumbar back pain or lower extremity pain: Secondary | ICD-10-CM | POA: Insufficient documentation

## 2013-04-22 DIAGNOSIS — M47817 Spondylosis without myelopathy or radiculopathy, lumbosacral region: Secondary | ICD-10-CM | POA: Diagnosis not present

## 2013-04-22 DIAGNOSIS — M5126 Other intervertebral disc displacement, lumbar region: Secondary | ICD-10-CM | POA: Insufficient documentation

## 2013-04-22 DIAGNOSIS — M48061 Spinal stenosis, lumbar region without neurogenic claudication: Secondary | ICD-10-CM | POA: Diagnosis not present

## 2013-04-22 DIAGNOSIS — M5144 Schmorl's nodes, thoracic region: Secondary | ICD-10-CM | POA: Diagnosis not present

## 2013-04-22 DIAGNOSIS — M5380 Other specified dorsopathies, site unspecified: Secondary | ICD-10-CM | POA: Insufficient documentation

## 2013-04-24 ENCOUNTER — Ambulatory Visit: Payer: Medicare Other | Admitting: Pulmonary Disease

## 2013-04-24 DIAGNOSIS — M48061 Spinal stenosis, lumbar region without neurogenic claudication: Secondary | ICD-10-CM | POA: Diagnosis not present

## 2013-04-25 ENCOUNTER — Telehealth: Payer: Self-pay | Admitting: Family Medicine

## 2013-04-25 NOTE — Telephone Encounter (Signed)
I'll address the hard copy.  

## 2013-04-25 NOTE — Telephone Encounter (Signed)
Patient is having surgery on a herniated disc by Dr.Gioffre.  Patient needs clearance for surgery.  Patient's husband said they sent a form to you today.  He wanted to know if she'll need to be seen or if you'll sign the release.  If patient needs to be seen, she'll need an appointment as soon as possible.

## 2013-04-28 ENCOUNTER — Encounter (HOSPITAL_COMMUNITY): Payer: Self-pay | Admitting: Pharmacy Technician

## 2013-04-28 ENCOUNTER — Encounter: Payer: Self-pay | Admitting: Family Medicine

## 2013-04-28 ENCOUNTER — Ambulatory Visit (INDEPENDENT_AMBULATORY_CARE_PROVIDER_SITE_OTHER): Payer: Medicare Other | Admitting: Family Medicine

## 2013-04-28 VITALS — BP 142/90 | HR 84 | Temp 98.0°F | Ht 61.0 in | Wt 181.8 lb

## 2013-04-28 DIAGNOSIS — Z8249 Family history of ischemic heart disease and other diseases of the circulatory system: Secondary | ICD-10-CM

## 2013-04-28 DIAGNOSIS — M549 Dorsalgia, unspecified: Secondary | ICD-10-CM | POA: Diagnosis not present

## 2013-04-28 NOTE — Assessment & Plan Note (Signed)
Back surgery planned.  FH CAD.  No personal history.  Off ASA and NSAIDS already.  No CP, SOB.  No h/o MI, CVA, CHF.  No BLE edema.  Risk and benefits of surgery d/w pt.  Pain control is the main issue for her no.  Activity limited by back pain, not by any CV process.  She is appropriately low risk for surgery.

## 2013-04-28 NOTE — Patient Instructions (Addendum)
Take care.  I'll send a note to ortho. Glad to see you .

## 2013-04-28 NOTE — Progress Notes (Signed)
FH CAD.  No personal history. Back surgery planned.  Off ASA and NSAIDS already.  No CP, SOB.  No h/o MI, CVA, CHF.  No BLE edema.  Risk and benefits of surgery d/w pt.  Pain control is the main issue for her now.  Activity limited by back pain, not by any CV process.    Meds, vitals, and allergies reviewed.   ROS: See HPI.  Otherwise, noncontributory.  nad but uncomfortable from back pain Mmm rrr ctab abd soft Ext w/o edema  EKG NSR w/o acute changes.

## 2013-04-29 ENCOUNTER — Encounter (HOSPITAL_COMMUNITY)
Admission: RE | Admit: 2013-04-29 | Discharge: 2013-04-29 | Disposition: A | Discharge status: Home / Self Care | Payer: Medicare Other | Source: Ambulatory Visit | Attending: Orthopedic Surgery | Admitting: Orthopedic Surgery

## 2013-04-29 ENCOUNTER — Encounter (HOSPITAL_COMMUNITY): Payer: Self-pay

## 2013-04-29 ENCOUNTER — Inpatient Hospital Stay (HOSPITAL_COMMUNITY): Admission: RE | Admit: 2013-04-29 | Payer: Medicare Other | Source: Ambulatory Visit

## 2013-04-29 ENCOUNTER — Ambulatory Visit (HOSPITAL_COMMUNITY)
Admission: RE | Admit: 2013-04-29 | Discharge: 2013-04-29 | Disposition: A | Discharge status: Home / Self Care | Payer: Medicare Other | Source: Ambulatory Visit | Attending: Surgical | Admitting: Surgical

## 2013-04-29 DIAGNOSIS — M4310 Spondylolisthesis, site unspecified: Secondary | ICD-10-CM | POA: Insufficient documentation

## 2013-04-29 DIAGNOSIS — M47817 Spondylosis without myelopathy or radiculopathy, lumbosacral region: Secondary | ICD-10-CM | POA: Diagnosis not present

## 2013-04-29 DIAGNOSIS — M48061 Spinal stenosis, lumbar region without neurogenic claudication: Secondary | ICD-10-CM | POA: Diagnosis not present

## 2013-04-29 DIAGNOSIS — M51379 Other intervertebral disc degeneration, lumbosacral region without mention of lumbar back pain or lower extremity pain: Secondary | ICD-10-CM | POA: Insufficient documentation

## 2013-04-29 DIAGNOSIS — M5137 Other intervertebral disc degeneration, lumbosacral region: Secondary | ICD-10-CM | POA: Insufficient documentation

## 2013-04-29 DIAGNOSIS — M5126 Other intervertebral disc displacement, lumbar region: Secondary | ICD-10-CM | POA: Diagnosis not present

## 2013-04-29 HISTORY — DX: Personal history of other diseases of the digestive system: Z87.19

## 2013-04-29 LAB — URINALYSIS, ROUTINE W REFLEX MICROSCOPIC
Bilirubin Urine: NEGATIVE
Glucose, UA: NEGATIVE mg/dL
Hgb urine dipstick: NEGATIVE
Ketones, ur: NEGATIVE mg/dL
Leukocytes, UA: NEGATIVE
Nitrite: NEGATIVE
Protein, ur: NEGATIVE mg/dL
Specific Gravity, Urine: 1.013 (ref 1.005–1.030)
Urobilinogen, UA: 0.2 mg/dL (ref 0.0–1.0)
pH: 7 (ref 5.0–8.0)

## 2013-04-29 LAB — CBC
MCH: 32.8 pg (ref 26.0–34.0)
MCHC: 34.1 g/dL (ref 30.0–36.0)
MCV: 96.3 fL (ref 78.0–100.0)
Platelets: 281 10*3/uL (ref 150–400)
RBC: 4.57 MIL/uL (ref 3.87–5.11)
RDW: 13.4 % (ref 11.5–15.5)

## 2013-04-29 LAB — PROTIME-INR
INR: 0.93 (ref 0.00–1.49)
Prothrombin Time: 12.3 seconds (ref 11.6–15.2)

## 2013-04-29 LAB — APTT: aPTT: 29 seconds (ref 24–37)

## 2013-04-29 LAB — COMPREHENSIVE METABOLIC PANEL
ALT: 30 U/L (ref 0–35)
AST: 23 U/L (ref 0–37)
Albumin: 3.9 g/dL (ref 3.5–5.2)
Alkaline Phosphatase: 91 U/L (ref 39–117)
BUN: 16 mg/dL (ref 6–23)
CO2: 32 mEq/L (ref 19–32)
Calcium: 10 mg/dL (ref 8.4–10.5)
Chloride: 98 mEq/L (ref 96–112)
Creatinine, Ser: 0.68 mg/dL (ref 0.50–1.10)
GFR calc Af Amer: >90 mL/min (ref >90)
GFR calc non Af Amer: 81 mL/min — ABNORMAL LOW (ref >90)
Glucose, Bld: 100 mg/dL — ABNORMAL HIGH (ref 70–99)
Potassium: 4.8 mEq/L (ref 3.5–5.1)
Sodium: 136 mEq/L (ref 135–145)
Total Bilirubin: 0.4 mg/dL (ref 0.3–1.2)
Total Protein: 7.3 g/dL (ref 6.0–8.3)

## 2013-04-29 NOTE — H&P (Signed)
Robin Juarez is an 79 y.o. female.   Chief Complaint: back pain HPI: Robin Juarez is a 79 year old female who presented with the chief complaint of back pain. She has had pain in the back for several years. She had a previous fusion by Dr. Fraser Din at L5-S1 for a pseudospondylolisthesis. She started to an increase in back pain earlier this year, in addition to several falls from weakness in her legs. She denies numbness and tingling in the legs. No change in bladder or bowel function.   Past Medical History  Diagnosis Date  . Hyperlipidemia   . GERD (gastroesophageal reflux disease)   . Osteoporosis   . Arthritis   . Fibromyalgia   . UTI (lower urinary tract infection)   . Urinary incontinence   . Depression 1993, 2000  . Blood transfusion without reported diagnosis   . OSA (obstructive sleep apnea)     dx 2010-had a cpap-does not use-    Past Surgical History  Procedure Laterality Date  . Cardiovascular stress test  2009    Normal, Dr. Mayford Knife  . Breast biopsy  1995    x 2  . Abdominal hysterectomy  1976  . Back fusions  1977    missing vertebra  . Childbirth  [phone]  . Ovarian cyst removal  1964  . Appendectomy  1964  . Dilatation and currettage  1964  . Cataract extraction, bilateral Bilateral 2005  . Breast biopsy  07/10/2012    Procedure: BREAST BIOPSY WITH NEEDLE LOCALIZATION;  Surgeon: Emelia Loron, MD;  Location: [facility];  Service: General;  Laterality: Right;  . Cystoscopy N/A 11/25/2012    Procedure: CYSTOSCOPY;  Surgeon: Martina Sinner, MD;  Location: [facility];  Service: Urology;  Laterality: N/A;  . Pubovaginal sling N/A 11/25/2012    Procedure: Leonides Grills;  Surgeon: Martina Sinner, MD;  Location: [facility];  Service: Urology;  Laterality: N/A;    Family History  Problem Relation Age of Onset  . Arthritis Mother   . Kidney disease Maternal Grandmother   . Stroke Father   .  Hypertension Father   . Heart disease Brother     Died during CABG  . Hypertension Sister   . Dementia Sister   . Aneurysm Sister     Brain  . Parkinsonism Sister   . Colon cancer Neg Hx   . Breast cancer Neg Hx    Social History:  reports that she has never smoked. She has never used smokeless tobacco. She reports that she does not drink alcohol or use illicit drugs.  Allergies:  Allergies  Allergen Reactions  . Alendronate Sodium     cramps  . Belladonna Alk-Phenobarbital Other (See Comments)    Unsure-doesn't remember  . Cefaclor     Felt bad  . Cephalexin     Felt bad, but able to tolerate amoxil prev  . Chlordiazepoxide     REACTION: (Maybe)  . Clavulanic Acid     Not an allergy.  Tolerates plain amoxil but not augmentin- diarrhea, etc with augmentin  . Digoxin     Felt weak, tired, "aweful"  . Hydrocodone-Acetaminophen Other (See Comments)    : Bad effect with other meds.  . Oxcarbazepine   . Simvastatin     Myalgia, possible intolerance  . Streptomycin     Lips itched  . Sulfonamide Derivatives     REACTION: (Maybe)-neck pain-brief  . Tetracycline     itching  .  Omeprazole Rash    rash     Current outpatient prescriptions: aspirin EC 81 MG tablet, Take 81 mg by mouth at bedtime., Disp: , Rfl: ;   cholecalciferol (VITAMIN D) 1000 UNITS tablet, Take 1,000 Units by mouth daily.  , Disp: , Rfl: ;  co-enzyme Q-10 30 MG capsule, Take 30 mg by mouth daily. , Disp: , Rfl: ;   DULoxetine (CYMBALTA) 60 MG capsule, Take 60 mg by mouth every morning. , Disp: , Rfl: ;   folic acid (FOLVITE) 800 MCG tablet, Take 800 mcg by mouth daily. , Disp: , Rfl:  gabapentin (NEURONTIN) 300 MG capsule, Take 300 mg by mouth 3 (three) times daily. , Disp: , Rfl: ;   HYDROcodone-acetaminophen (NORCO/VICODIN) 5-325 MG per tablet, Take 1 tablet by mouth every 8 (eight) hours as needed for pain., Disp: , Rfl: ;   HYDROmorphone (DILAUDID) 2 MG tablet, Take 2 mg by mouth every 4 (four)  hours as needed for pain., Disp: , Rfl:  ibuprofen (ADVIL,MOTRIN) 200 MG tablet, Take 400 mg by mouth every 8 (eight) hours as needed for pain., Disp: , Rfl: ;   lamoTRIgine (LAMICTAL) 100 MG tablet, Take 100 mg by mouth at bedtime., Disp: , Rfl: ;   OLANZapine (ZYPREXA) 5 MG tablet, Take 2.5 mg by mouth at bedtime. , Disp: , Rfl: ;   omega-3 acid ethyl esters (LOVAZA) 1 G capsule, Take 1 g by mouth daily., Disp: , Rfl:  predniSONE (DELTASONE) 20 MG tablet, Take 2 tablets (40 mg total) by mouth daily., Disp: 14 tablet, Rfl: 0  RADIOGRAPHS: Two plain x-rays of the patient's back were taken because we did not have access to her other x-rays. She had a previous fusion by Dr. Fraser Din that most likely was at L5-S1 for a pseudospondylolisthesis.  MRI shows spinal stenosis at L3-4 with a large disk herniation L3-4 on the left migrating cephalad.  Review of Systems  Constitutional: Negative.   HENT: Negative.  Negative for neck pain.   Eyes: Negative.   Respiratory: Positive for shortness of breath. Negative for cough, hemoptysis, sputum production and wheezing.        SOB on exertion  Cardiovascular: Negative.   Gastrointestinal: Negative.   Genitourinary: Positive for frequency. Negative for dysuria, urgency, hematuria and flank pain.  Musculoskeletal: Positive for back pain and joint pain. Negative for myalgias and falls.  Skin: Negative.   Neurological: Negative.   Endo/Heme/Allergies: Negative.   Psychiatric/Behavioral: Negative.    Vitals Weight: 175 lb Height: 61 in Body Surface Area: 1.85 m Body Mass Index: 33.07 kg/m Pulse: 82 (Regular) BP: 166/94 (Sitting, Left Arm, Standard)  Physical Exam  Constitutional: She is oriented to person, place, and time. She appears well-developed and well-nourished. No distress.  HENT:  Head: Normocephalic and atraumatic.  Right Ear: External ear normal.  Left Ear: External ear normal.  Nose: Nose normal.  Mouth/Throat: Oropharynx  is clear and moist.  Eyes: Conjunctivae and EOM are normal.  Neck: Normal range of motion. Neck supple.  Cardiovascular: Normal rate, regular rhythm, normal heart sounds and intact distal pulses.   No murmur heard. Respiratory: Effort normal and breath sounds normal. No respiratory distress. She has no wheezes.  GI: Soft. Bowel sounds are normal. She exhibits no distension. There is no tenderness.  Musculoskeletal:       Right hip: Normal.       Left hip: Normal.       Right knee: Normal.  Left knee: Normal.       Lumbar back: She exhibits decreased range of motion, pain and spasm.       Right lower leg: She exhibits no tenderness and no swelling.       Left lower leg: She exhibits no tenderness and no swelling.  Gait: The patient came in in a wheelchair. On examination, motor examination she has marked weakness of her hips flexors on the left. Hips, knees there is no major pain with hip and knee motion. Examination of her back, flexion and extension of her back is painful. She has no peripheral edema.     Neurological: She is alert and oriented to person, place, and time. She has normal strength and normal reflexes. No sensory deficit.  Skin: No rash noted. She is not diaphoretic. No erythema.  Psychiatric: She has a normal mood and affect. Her behavior is normal.     Assessment/Plan Lumbar spinal stenosis The possible complications of spinal surgery number one could be infection, which is extremely rare. We do use antibiotics prior to the surgery and during surgery and after surgery. Number two is always a slight degree of probability that you could develop a blood clot in your leg after any type of surgery and we try our best to prevent that with aspirin post op when it is safe to begin. The third is a dural leak. That is the spinal fluid leak that could occur. At certain rare times the bone or the disc could literally stick to the dura which is the lining which contains the  spinal fluid and we could develop a small tear in that lining which we then patch up. That is an extremely rare complication. The last and final complication is a recurrent disc rupture. That means that you could rupture another small piece of disc later on down the road and there is about a 2% chance of that. The patient needs to have a central decompressive lumbar laminectomy at L3-4 with microdiscectomy L3-4 on the left.   Ladiamond Gallina LAUREN 04/29/2013, 11:19 AM

## 2013-04-29 NOTE — Progress Notes (Signed)
04-29-13 1310 labs viewable in Epic, please note.

## 2013-04-29 NOTE — Patient Instructions (Addendum)
20 Robin Juarez  04/29/2013   Your procedure is scheduled on: 9-11  -2014  Report to [facility] at    1100   AM.  Call this number if you have problems the morning of surgery: [phone]  Or Presurgical Testing [phone](Ismerai Bin)    Do not eat food:After Midnight.  May have clear liquids:up to 6 Hours before arrival. Nothing after : 0700 AM  Clear liquids include soda, tea, black coffee, apple or grape juice, broth.  Take these medicines the morning of surgery with A SIP OF WATER: Cymblata. Gabapentin.Hydrocodone or Hydromorphone as needed. Prednisone.   Do not wear jewelry, make-up or nail polish.  Do not wear lotions, powders, or perfumes. You may wear deodorant.  Do not shave 12 hours prior to first CHG shower(legs and under arms).(face and neck okay.)  Do not bring valuables to the hospital.  Contacts, dentures or bridgework,body piercing,  may not be worn into surgery.  Leave suitcase in the car. After surgery it may be brought to your room.  For patients admitted to the hospital, checkout time is 11:00 AM the day of discharge.   Patients discharged the day of surgery will not be allowed to drive home. Must have responsible person with you x 24 hours once discharged.  Name and phone number of your driver: Chinara Hertzberg [phone] cell  Special Instructions: CHG(Chlorhedine 4%-"Hibiclens","Betasept","Aplicare") Shower Use Special Wash: see special instructions.(avoid face and genitals)   Please read over the following fact sheets that you were given: MRSA Information, Incentive Spirometry Instruction.    Failure to follow these instructions may result in Cancellation of your surgery.   Patient signature_______________________________________________________

## 2013-04-29 NOTE — Pre-Procedure Instructions (Addendum)
04-29-13 EKG 04-28-13/ CXR 01-12-13 -Epic. Back Xray done today. 04-29-13 1310-labs viewable in Epic-note faxed.W. Kennon Portela

## 2013-04-30 ENCOUNTER — Inpatient Hospital Stay (HOSPITAL_COMMUNITY): Admission: RE | Admit: 2013-04-30 | Payer: Medicare Other | Source: Ambulatory Visit

## 2013-05-01 ENCOUNTER — Inpatient Hospital Stay (HOSPITAL_COMMUNITY): Payer: Medicare Other

## 2013-05-01 ENCOUNTER — Encounter (HOSPITAL_COMMUNITY): Payer: Self-pay | Admitting: Certified Registered Nurse Anesthetist

## 2013-05-01 ENCOUNTER — Inpatient Hospital Stay (HOSPITAL_COMMUNITY)
Admission: RE | Admit: 2013-05-01 | Discharge: 2013-05-03 | DRG: 491 | Disposition: A | Discharge status: Home Health | Payer: Medicare Other | Source: Ambulatory Visit | Attending: Orthopedic Surgery | Admitting: Orthopedic Surgery

## 2013-05-01 ENCOUNTER — Inpatient Hospital Stay (HOSPITAL_COMMUNITY): Payer: Medicare Other | Admitting: Certified Registered Nurse Anesthetist

## 2013-05-01 ENCOUNTER — Encounter (HOSPITAL_COMMUNITY)
Admission: RE | Disposition: A | Discharge status: Home Health | Payer: Self-pay | Source: Ambulatory Visit | Attending: Orthopedic Surgery

## 2013-05-01 ENCOUNTER — Encounter (HOSPITAL_COMMUNITY): Payer: Self-pay | Admitting: *Deleted

## 2013-05-01 DIAGNOSIS — M48061 Spinal stenosis, lumbar region without neurogenic claudication: Principal | ICD-10-CM | POA: Diagnosis present

## 2013-05-01 DIAGNOSIS — R5381 Other malaise: Secondary | ICD-10-CM | POA: Diagnosis present

## 2013-05-01 DIAGNOSIS — M5126 Other intervertebral disc displacement, lumbar region: Secondary | ICD-10-CM | POA: Diagnosis not present

## 2013-05-01 DIAGNOSIS — F3289 Other specified depressive episodes: Secondary | ICD-10-CM | POA: Diagnosis not present

## 2013-05-01 DIAGNOSIS — R29898 Other symptoms and signs involving the musculoskeletal system: Secondary | ICD-10-CM | POA: Diagnosis present

## 2013-05-01 DIAGNOSIS — M79609 Pain in unspecified limb: Secondary | ICD-10-CM | POA: Diagnosis not present

## 2013-05-01 DIAGNOSIS — R5383 Other fatigue: Secondary | ICD-10-CM | POA: Diagnosis present

## 2013-05-01 DIAGNOSIS — M48062 Spinal stenosis, lumbar region with neurogenic claudication: Secondary | ICD-10-CM | POA: Diagnosis present

## 2013-05-01 DIAGNOSIS — Z981 Arthrodesis status: Secondary | ICD-10-CM | POA: Diagnosis not present

## 2013-05-01 DIAGNOSIS — M810 Age-related osteoporosis without current pathological fracture: Secondary | ICD-10-CM | POA: Diagnosis present

## 2013-05-01 DIAGNOSIS — F329 Major depressive disorder, single episode, unspecified: Secondary | ICD-10-CM | POA: Diagnosis present

## 2013-05-01 DIAGNOSIS — G4733 Obstructive sleep apnea (adult) (pediatric): Secondary | ICD-10-CM | POA: Diagnosis present

## 2013-05-01 DIAGNOSIS — M539 Dorsopathy, unspecified: Secondary | ICD-10-CM | POA: Diagnosis not present

## 2013-05-01 DIAGNOSIS — IMO0001 Reserved for inherently not codable concepts without codable children: Secondary | ICD-10-CM | POA: Diagnosis not present

## 2013-05-01 DIAGNOSIS — M549 Dorsalgia, unspecified: Secondary | ICD-10-CM | POA: Diagnosis not present

## 2013-05-01 DIAGNOSIS — K219 Gastro-esophageal reflux disease without esophagitis: Secondary | ICD-10-CM | POA: Diagnosis not present

## 2013-05-01 HISTORY — PX: LUMBAR LAMINECTOMY/DECOMPRESSION MICRODISCECTOMY: SHX5026

## 2013-05-01 SURGERY — LUMBAR LAMINECTOMY/DECOMPRESSION MICRODISCECTOMY 2 LEVELS
Anesthesia: General | Site: Back | Wound class: Clean

## 2013-05-01 MED ORDER — BISACODYL 10 MG RE SUPP: 10.0000 mg | Freq: Every day | RECTAL | Status: DC | PRN

## 2013-05-01 MED ORDER — LACTATED RINGERS IV SOLN
INTRAVENOUS | Status: DC
  Administered 2013-05-01: 1000 mL via INTRAVENOUS
  Administered 2013-05-01: 15:00:00 via INTRAVENOUS

## 2013-05-01 MED ORDER — EPHEDRINE SULFATE 50 MG/ML IJ SOLN
INTRAMUSCULAR | Status: DC | PRN
  Administered 2013-05-01 (×2): 10 mg via INTRAVENOUS

## 2013-05-01 MED ORDER — DULOXETINE HCL 60 MG PO CPEP
60.0000 mg | ORAL_CAPSULE | Freq: Every morning | ORAL | Status: DC
  Administered 2013-05-02 – 2013-05-03 (×2): 60 mg via ORAL
  Filled 2013-05-01 (×2): qty 1

## 2013-05-01 MED ORDER — MENTHOL 3 MG MT LOZG
1.0000 | LOZENGE | OROMUCOSAL | Status: DC | PRN
  Filled 2013-05-01: qty 9

## 2013-05-01 MED ORDER — CLINDAMYCIN PHOSPHATE 600 MG/50ML IV SOLN
600.0000 mg | Freq: Three times a day (TID) | INTRAVENOUS | Status: AC
  Administered 2013-05-01 – 2013-05-02 (×2): 600 mg via INTRAVENOUS
  Filled 2013-05-01 (×2): qty 50

## 2013-05-01 MED ORDER — NEOSTIGMINE METHYLSULFATE 1 MG/ML IJ SOLN
INTRAMUSCULAR | Status: DC | PRN
  Administered 2013-05-01: 4 mg via INTRAVENOUS

## 2013-05-01 MED ORDER — PHENOL 1.4 % MT LIQD: 1.0000 | OROMUCOSAL | Status: DC | PRN

## 2013-05-01 MED ORDER — CLINDAMYCIN PHOSPHATE 900 MG/50ML IV SOLN
INTRAVENOUS | Status: AC
  Filled 2013-05-01: qty 50

## 2013-05-01 MED ORDER — LACTATED RINGERS IV SOLN
INTRAVENOUS | Status: DC
Start: 2013-05-01 — End: 2013-05-03
  Administered 2013-05-01 – 2013-05-02 (×2): via INTRAVENOUS

## 2013-05-01 MED ORDER — ONDANSETRON HCL 4 MG/2ML IJ SOLN: 4.0000 mg | INTRAMUSCULAR | Status: DC | PRN

## 2013-05-01 MED ORDER — GLYCOPYRROLATE 0.2 MG/ML IJ SOLN
INTRAMUSCULAR | Status: DC | PRN
  Administered 2013-05-01: 0.6 mg via INTRAVENOUS

## 2013-05-01 MED ORDER — SUCCINYLCHOLINE CHLORIDE 20 MG/ML IJ SOLN
INTRAMUSCULAR | Status: DC | PRN
  Administered 2013-05-01: 100 mg via INTRAVENOUS

## 2013-05-01 MED ORDER — HYDROMORPHONE HCL PF 1 MG/ML IJ SOLN
INTRAMUSCULAR | Status: AC
  Filled 2013-05-01: qty 1

## 2013-05-01 MED ORDER — METHOCARBAMOL 500 MG PO TABS
500.0000 mg | ORAL_TABLET | Freq: Four times a day (QID) | ORAL | Status: DC | PRN
  Administered 2013-05-02 – 2013-05-03 (×4): 500 mg via ORAL
  Filled 2013-05-01 (×4): qty 1

## 2013-05-01 MED ORDER — LAMOTRIGINE 100 MG PO TABS
100.0000 mg | ORAL_TABLET | Freq: Every day | ORAL | Status: DC
  Administered 2013-05-01 – 2013-05-02 (×2): 100 mg via ORAL
  Filled 2013-05-01 (×3): qty 1

## 2013-05-01 MED ORDER — BACITRACIN ZINC 500 UNIT/GM EX OINT
TOPICAL_OINTMENT | CUTANEOUS | Status: AC
  Filled 2013-05-01: qty 28.35

## 2013-05-01 MED ORDER — ONDANSETRON HCL 4 MG/2ML IJ SOLN
INTRAMUSCULAR | Status: DC | PRN
  Administered 2013-05-01: 4 mg via INTRAVENOUS

## 2013-05-01 MED ORDER — THROMBIN 5000 UNITS EX SOLR
CUTANEOUS | Status: AC
  Filled 2013-05-01: qty 10000

## 2013-05-01 MED ORDER — STERILE WATER FOR IRRIGATION IR SOLN
Status: DC | PRN
  Administered 2013-05-01: 500 mL

## 2013-05-01 MED ORDER — FENTANYL CITRATE 0.05 MG/ML IJ SOLN
INTRAMUSCULAR | Status: DC | PRN
  Administered 2013-05-01 (×2): 50 ug via INTRAVENOUS
  Administered 2013-05-01 (×3): 25 ug via INTRAVENOUS

## 2013-05-01 MED ORDER — BUPIVACAINE-EPINEPHRINE 0.25% -1:200000 IJ SOLN
INTRAMUSCULAR | Status: DC | PRN
  Administered 2013-05-01: 20 mL

## 2013-05-01 MED ORDER — ROCURONIUM BROMIDE 100 MG/10ML IV SOLN
INTRAVENOUS | Status: DC | PRN
  Administered 2013-05-01: 10 mg via INTRAVENOUS
  Administered 2013-05-01: 40 mg via INTRAVENOUS

## 2013-05-01 MED ORDER — BUPIVACAINE-EPINEPHRINE PF 0.25-1:200000 % IJ SOLN
INTRAMUSCULAR | Status: AC
  Filled 2013-05-01: qty 30

## 2013-05-01 MED ORDER — BUPIVACAINE LIPOSOME 1.3 % IJ SUSP
INTRAMUSCULAR | Status: DC | PRN
  Administered 2013-05-01: 20 mL

## 2013-05-01 MED ORDER — POLYETHYLENE GLYCOL 3350 17 G PO PACK: 17.0000 g | PACK | Freq: Every day | ORAL | Status: DC | PRN

## 2013-05-01 MED ORDER — GABAPENTIN 300 MG PO CAPS
300.0000 mg | ORAL_CAPSULE | Freq: Three times a day (TID) | ORAL | Status: DC
  Administered 2013-05-01 – 2013-05-03 (×6): 300 mg via ORAL
  Filled 2013-05-01 (×7): qty 1

## 2013-05-01 MED ORDER — PROPOFOL 10 MG/ML IV BOLUS
INTRAVENOUS | Status: DC | PRN
  Administered 2013-05-01: 120 mg via INTRAVENOUS

## 2013-05-01 MED ORDER — HYDROMORPHONE HCL PF 1 MG/ML IJ SOLN
0.5000 mg | INTRAMUSCULAR | Status: DC | PRN
Start: 2013-05-01 — End: 2013-05-03
  Administered 2013-05-02: 0.5 mg via INTRAVENOUS
  Filled 2013-05-01: qty 1

## 2013-05-01 MED ORDER — FLEET ENEMA 7-19 GM/118ML RE ENEM: 1.0000 | ENEMA | Freq: Once | RECTAL | Status: AC | PRN

## 2013-05-01 MED ORDER — METHOCARBAMOL 100 MG/ML IJ SOLN
500.0000 mg | Freq: Four times a day (QID) | INTRAVENOUS | Status: DC | PRN
  Administered 2013-05-01: 500 mg via INTRAVENOUS
  Filled 2013-05-01: qty 5

## 2013-05-01 MED ORDER — BUPIVACAINE LIPOSOME 1.3 % IJ SUSP
20.0000 mL | Freq: Once | INTRAMUSCULAR | Status: DC
  Filled 2013-05-01: qty 20

## 2013-05-01 MED ORDER — PROMETHAZINE HCL 25 MG/ML IJ SOLN: 6.2500 mg | INTRAMUSCULAR | Status: DC | PRN

## 2013-05-01 MED ORDER — SODIUM CHLORIDE 0.9 % IR SOLN
Status: DC | PRN
  Administered 2013-05-01: 13:00:00

## 2013-05-01 MED ORDER — OLANZAPINE 2.5 MG PO TABS
2.5000 mg | ORAL_TABLET | Freq: Every day | ORAL | Status: DC
  Administered 2013-05-01 – 2013-05-02 (×2): 2.5 mg via ORAL
  Filled 2013-05-01 (×3): qty 1

## 2013-05-01 MED ORDER — HYDROMORPHONE HCL PF 1 MG/ML IJ SOLN
0.2500 mg | INTRAMUSCULAR | Status: DC | PRN
  Administered 2013-05-01 (×2): 0.5 mg via INTRAVENOUS

## 2013-05-01 MED ORDER — CLINDAMYCIN PHOSPHATE 900 MG/50ML IV SOLN
900.0000 mg | INTRAVENOUS | Status: AC
  Administered 2013-05-01: 900 mg via INTRAVENOUS

## 2013-05-01 MED ORDER — DEXAMETHASONE SODIUM PHOSPHATE 10 MG/ML IJ SOLN
INTRAMUSCULAR | Status: DC | PRN
  Administered 2013-05-01: 10 mg via INTRAVENOUS

## 2013-05-01 MED ORDER — HYDROMORPHONE HCL 2 MG PO TABS
2.0000 mg | ORAL_TABLET | ORAL | Status: DC | PRN
  Administered 2013-05-01 – 2013-05-03 (×7): 2 mg via ORAL
  Administered 2013-05-03: 4 mg via ORAL
  Filled 2013-05-01 (×9): qty 1

## 2013-05-01 MED ORDER — LIDOCAINE HCL (CARDIAC) 20 MG/ML IV SOLN
INTRAVENOUS | Status: DC | PRN
  Administered 2013-05-01: 100 mg via INTRAVENOUS

## 2013-05-01 MED ORDER — PHENYLEPHRINE HCL 10 MG/ML IJ SOLN
INTRAMUSCULAR | Status: DC | PRN
  Administered 2013-05-01: 80 ug via INTRAVENOUS

## 2013-05-01 MED ORDER — THROMBIN 5000 UNITS EX SOLR
CUTANEOUS | Status: DC | PRN
  Administered 2013-05-01: 10000 [IU] via TOPICAL

## 2013-05-01 SURGICAL SUPPLY — 45 items
BAG ZIPLOCK 12X15 (MISCELLANEOUS) ×2 IMPLANT
BENZOIN TINCTURE PRP APPL 2/3 (GAUZE/BANDAGES/DRESSINGS) ×2 IMPLANT
CLEANER TIP ELECTROSURG 2X2 (MISCELLANEOUS) ×2 IMPLANT
CLOTH BEACON ORANGE TIMEOUT ST (SAFETY) ×2 IMPLANT
CONT SPECI 4OZ STER CLIK (MISCELLANEOUS) ×2 IMPLANT
DRAIN PENROSE 18X1/4 LTX STRL (WOUND CARE) IMPLANT
DRAPE MICROSCOPE LEICA (MISCELLANEOUS) ×2 IMPLANT
DRAPE POUCH INSTRU U-SHP 10X18 (DRAPES) ×2 IMPLANT
DRAPE SURG 17X11 SM STRL (DRAPES) ×2 IMPLANT
DRSG ADAPTIC 3X8 NADH LF (GAUZE/BANDAGES/DRESSINGS) ×2 IMPLANT
DRSG EMULSION OIL 3X3 NADH (GAUZE/BANDAGES/DRESSINGS) ×2 IMPLANT
DRSG PAD ABDOMINAL 8X10 ST (GAUZE/BANDAGES/DRESSINGS) ×8 IMPLANT
DURAPREP 26ML APPLICATOR (WOUND CARE) ×2 IMPLANT
ELECT REM PT RETURN 9FT ADLT (ELECTROSURGICAL) ×2
ELECTRODE REM PT RTRN 9FT ADLT (ELECTROSURGICAL) ×1 IMPLANT
GLOVE BIOGEL PI IND STRL 8 (GLOVE) ×2 IMPLANT
GLOVE BIOGEL PI INDICATOR 8 (GLOVE) ×2
GLOVE ECLIPSE 8.0 STRL XLNG CF (GLOVE) ×4 IMPLANT
GOWN PREVENTION PLUS LG XLONG (DISPOSABLE) ×6 IMPLANT
GOWN STRL REIN XL XLG (GOWN DISPOSABLE) ×4 IMPLANT
KIT BASIN OR (CUSTOM PROCEDURE TRAY) ×2 IMPLANT
KIT POSITIONING SURG ANDREWS (MISCELLANEOUS) ×2 IMPLANT
MANIFOLD NEPTUNE II (INSTRUMENTS) ×2 IMPLANT
NEEDLE HYPO 22GX1.5 SAFETY (NEEDLE) IMPLANT
NEEDLE SPNL 18GX3.5 QUINCKE PK (NEEDLE) ×4 IMPLANT
NS IRRIG 1000ML POUR BTL (IV SOLUTION) ×2 IMPLANT
PATTIES SURGICAL .5 X.5 (GAUZE/BANDAGES/DRESSINGS) IMPLANT
PATTIES SURGICAL .75X.75 (GAUZE/BANDAGES/DRESSINGS) IMPLANT
PATTIES SURGICAL 1X1 (DISPOSABLE) IMPLANT
PIN SAFETY NICK PLATE  2 MED (MISCELLANEOUS)
PIN SAFETY NICK PLATE 2 MED (MISCELLANEOUS) IMPLANT
POSITIONER SURGICAL ARM (MISCELLANEOUS) ×2 IMPLANT
SPONGE GAUZE 4X4 12PLY (GAUZE/BANDAGES/DRESSINGS) ×2 IMPLANT
SPONGE LAP 4X18 X RAY DECT (DISPOSABLE) IMPLANT
SPONGE SURGIFOAM ABS GEL 100 (HEMOSTASIS) ×2 IMPLANT
STAPLER VISISTAT 35W (STAPLE) IMPLANT
SUT VIC AB 0 CT1 27 (SUTURE) ×2
SUT VIC AB 0 CT1 27XBRD ANTBC (SUTURE) ×1 IMPLANT
SUT VIC AB 1 CT1 27 (SUTURE) ×4
SUT VIC AB 1 CT1 27XBRD ANTBC (SUTURE) ×2 IMPLANT
SYR 20CC LL (SYRINGE) ×2 IMPLANT
TAPE CLOTH SURG 6X10 WHT LF (GAUZE/BANDAGES/DRESSINGS) ×2 IMPLANT
TOWEL OR 17X26 10 PK STRL BLUE (TOWEL DISPOSABLE) ×4 IMPLANT
TRAY LAMINECTOMY (CUSTOM PROCEDURE TRAY) ×2 IMPLANT
WATER STERILE IRR 1500ML POUR (IV SOLUTION) ×2 IMPLANT

## 2013-05-01 NOTE — Interval H&P Note (Signed)
History and Physical Interval Note:  05/01/2013 1:29 PM  Robin Juarez  has presented today for surgery, with the diagnosis of SPINAL STENOSIS  The various methods of treatment have been discussed with the patient and family. After consideration of risks, benefits and other options for treatment, the patient has consented to  Procedure(s): COMPLETE DECOMPRESSIVE LUMBAR LAMINECTOMY L3-L4 MICRODISCECTOMY L3-L4 ON THE LEFT (N/A) as a surgical intervention .  The patient's history has been reviewed, patient examined, no change in status, stable for surgery.  I have reviewed the patient's chart and labs.  Questions were answered to the patient's satisfaction.     Naidelin Gugliotta A

## 2013-05-01 NOTE — Transfer of Care (Signed)
Immediate Anesthesia Transfer of Care Note  Patient: Robin Juarez  Procedure(s) Performed: Procedure(s): COMPLETE DECOMPRESSIVE LUMBAR LAMINECTOMY L3-L4 MICRODISCECTOMY L3-L4 ON THE LEFT (N/A)  Patient Location: PACU  Anesthesia Type:General  Level of Consciousness: awake, alert , oriented and patient cooperative  Airway & Oxygen Therapy: Patient Spontanous Breathing and Patient connected to face mask oxygen  Post-op Assessment: Report given to PACU RN and Post -op Vital signs reviewed and stable  Post vital signs: Reviewed and stable  Complications: No apparent anesthesia complications

## 2013-05-01 NOTE — Anesthesia Preprocedure Evaluation (Signed)
Anesthesia Evaluation  Patient identified by MRN, date of birth, ID band Patient awake    Reviewed: Allergy & Precautions, H&P , NPO status , Patient's Chart, lab work & pertinent test results  Airway Mallampati: II TM Distance: >3 FB Neck ROM: Full    Dental no notable dental hx.    Pulmonary sleep apnea ,  breath sounds clear to auscultation  Pulmonary exam normal       Cardiovascular negative cardio ROS  Rhythm:Regular Rate:Normal     Neuro/Psych negative neurological ROS  negative psych ROS   GI/Hepatic negative GI ROS, Neg liver ROS,   Endo/Other  negative endocrine ROS  Renal/GU negative Renal ROS  negative genitourinary   Musculoskeletal negative musculoskeletal ROS (+)   Abdominal   Peds negative pediatric ROS (+)  Hematology negative hematology ROS (+)   Anesthesia Other Findings   Reproductive/Obstetrics negative OB ROS                           Anesthesia Physical Anesthesia Plan  ASA: II  Anesthesia Plan: General   Post-op Pain Management:    Induction: Intravenous  Airway Management Planned: Oral ETT  Additional Equipment:   Intra-op Plan:   Post-operative Plan: Extubation in OR  Informed Consent: I have reviewed the patients History and Physical, chart, labs and discussed the procedure including the risks, benefits and alternatives for the proposed anesthesia with the patient or authorized representative who has indicated his/her understanding and acceptance.   Dental advisory given  Plan Discussed with: CRNA and Surgeon  Anesthesia Plan Comments:         Anesthesia Quick Evaluation

## 2013-05-01 NOTE — Preoperative (Signed)
Beta Blockers   Reason not to administer Beta Blockers:Not Applicable 

## 2013-05-01 NOTE — Brief Op Note (Signed)
05/01/2013  3:57 PM  PATIENT:  Robin Juarez  79 y.o. female  PRE-OPERATIVE DIAGNOSIS:  SPINAL STENOSIS,Recurrent at L-4-L-5 from prior bone Graft 30 years ago and Spinal Stenosis at L-3-L-4 with weakness of Left Lower Extremity.  POST-OPERATIVE DIAGNOSIS:  SPINAL STENOSIS and same as Pre-Op  PROCEDURE:  Complete Decompressive Lumbar Laminectomy at L-3-L-4 and L-4-L-5 and removal of Bone Graft at the L-4-L-5 level for Recurrent Stenosis. SURGEON:  Surgeon(s) and Role:    * Jacki Cones, MD - Primary    * Drucilla Schmidt, MD - Assisting  :   ASSISTANTS: Marlowe Kays MD  ANESTHESIA:   general  EBL:  Total I/O In: 1300 [I.V.:1300] Out: 300 [Urine:250; Blood:50]  BLOOD ADMINISTERED:none  DRAINS: none   LOCAL MEDICATIONS USED:  MARCAINE 30cc of 0.25% with Epinephrine. Also Exparel 20cc.    SPECIMEN:  No Specimen  DISPOSITION OF SPECIMEN:  N/A  COUNTS:  YES  TOURNIQUET:  * No tourniquets in log *  DICTATION: .Other Dictation: Dictation Number [id number]  PLAN OF CARE: Admit to inpatient   PATIENT DISPOSITION:  PACU - hemodynamically stable.   Delay start of Pharmacological VTE agent (>24hrs) due to surgical blood loss or risk of bleeding: yes

## 2013-05-01 NOTE — Anesthesia Postprocedure Evaluation (Signed)
  Anesthesia Post-op Note  Patient: Robin Juarez  Procedure(s) Performed: Procedure(s) (LRB): COMPLETE DECOMPRESSIVE LUMBAR LAMINECTOMY L3-L4 MICRODISCECTOMY L3-L4 ON THE LEFT (N/A)  Patient Location: PACU  Anesthesia Type: General  Level of Consciousness: awake and alert   Airway and Oxygen Therapy: Patient Spontanous Breathing  Post-op Pain: mild  Post-op Assessment: Post-op Vital signs reviewed, Patient's Cardiovascular Status Stable, Respiratory Function Stable, Patent Airway and No signs of Nausea or vomiting  Last Vitals:  Filed Vitals:   05/01/13 1615  BP: 153/68  Pulse: 80  Temp:   Resp: 11    Post-op Vital Signs: stable   Complications: No apparent anesthesia complications

## 2013-05-02 ENCOUNTER — Encounter (HOSPITAL_COMMUNITY): Payer: Self-pay | Admitting: Orthopedic Surgery

## 2013-05-02 MED ORDER — HYDROMORPHONE HCL 2 MG PO TABS: 2.0000 mg | ORAL_TABLET | ORAL | Status: DC | PRN

## 2013-05-02 MED ORDER — METHOCARBAMOL 500 MG PO TABS: 500.0000 mg | ORAL_TABLET | Freq: Four times a day (QID) | ORAL | Status: DC | PRN

## 2013-05-02 NOTE — Evaluation (Signed)
Occupational Therapy Evaluation Patient Details Name: Robin Juarez MRN: [id number] DOB: [birth year] Today's Date: 05/02/2013 Time: 3086-5784 OT Time Calculation (min): 28 min  OT Assessment / Plan / Recommendation History of present illness  back surgery per Dr Darrelyn Hillock   Clinical Impression   Pt presents to OT s/p back surgery. Pt will benefit from skilled OT to increase I with ADL activity while adhering to back precautions    OT Assessment  Patient needs continued OT Services    Follow Up Recommendations  Home health OT       Equipment Recommendations  3 in 1 bedside comode       Frequency  Min 2X/week    Precautions / Restrictions Restrictions Weight Bearing Restrictions: No       ADL  Grooming: Performed;Brushing hair Where Assessed - Grooming: Unsupported sitting Upper Body Dressing: Performed;Set up Where Assessed - Upper Body Dressing: Unsupported sitting Lower Body Dressing: Simulated;Maximal assistance Where Assessed - Lower Body Dressing: Unsupported sit to stand Toilet Transfer: Minimal assistance Toilet Transfer Method: Sit to stand Toileting - Clothing Manipulation and Hygiene: Performed;Minimal assistance Where Assessed - Toileting Clothing Manipulation and Hygiene: Standing Transfers/Ambulation Related to ADLs: Educated in back precautions with ADL activity, but pt will need reinforcement prior to DC home.      OT Diagnosis:   Decreased strength OT Problem List: Decreased strength;Decreased activity tolerance;Decreased knowledge of precautions OT Treatment Interventions: Self-care/ADL training;Patient/family education;DME and/or AE instruction   OT Goals(Current goals can be found in the care plan section) Acute Rehab OT Goals Patient Stated Goal: get home and be able to do for my self  OT Goal Formulation: With patient Time For Goal Achievement: 05/02/13 Potential to Achieve Goals: Good ADL Goals Pt Will Perform Grooming: with  supervision;standing Pt Will Perform Upper Body Dressing: with set-up;sitting Pt Will Perform Lower Body Dressing: with supervision;with adaptive equipment;sit to/from stand Pt Will Transfer to Toilet: with supervision;ambulating;regular height toilet Pt Will Perform Toileting - Clothing Manipulation and hygiene: with supervision;sit to/from stand Pt Will Perform Tub/Shower Transfer: with supervision;ambulating  Visit Information  Last OT Received On: 05/02/13       Prior Functioning     Home Living Family/patient expects to be discharged to:: Private residence Living Arrangements: Spouse/significant other Available Help at Discharge: Family Type of Home: House (split level- 3 stairs) Home Access: Stairs to enter [employer] of Steps: 1 Home Layout: Two level Home Equipment: Hand held shower head;Walker - 2 wheels;Cane - single point Prior Function Level of Independence: Independent Communication Communication: No difficulties         Vision/Perception Vision - History Baseline Vision: Wears glasses all the time Patient Visual Report: No change from baseline   Cognition  Cognition Arousal/Alertness: Awake/alert Behavior During Therapy: WFL for tasks assessed/performed Overall Cognitive Status: Within Functional Limits for tasks assessed    Extremity/Trunk Assessment Upper Extremity Assessment Upper Extremity Assessment: Generalized weakness     Mobility Bed Mobility Bed Mobility: Not assessed Details for Bed Mobility Assistance: pt sitting EOB  Transfers Transfers: Sit to Stand;Stand to Sit           End of Session OT - End of Session Equipment Utilized During Treatment: Rolling walker Activity Tolerance: Patient tolerated treatment well Patient left: in chair  GO     Krystal Teachey, [place] Kung 05/02/2013, 9:36 AM

## 2013-05-02 NOTE — Evaluation (Signed)
Physical Therapy Evaluation Patient Details Name: Robin Juarez MRN: [id number] DOB: 03-10-1931 Today's Date: 05/02/2013 Time: 1040-1105 PT Time Calculation (min): 25 min  PT Assessment / Plan / Recommendation History of Present Illness  79 y.o. female with h/o fusion at L5-S1, fibromyalgia admitted for  L3-4 microdiscectomy on L.   Clinical Impression  **Pt doing well with mobility. She walked 100' with RW with supervision, min A for bed mobility. Expect good progress. She would benefit from acute PT to maximize safety and independence with mobility. *    PT Assessment  Patient needs continued PT services    Follow Up Recommendations  Home health PT    Does the patient have the potential to tolerate intense rehabilitation      Barriers to Discharge        Equipment Recommendations  Rolling walker with 5" wheels;3in1 (PT)    Recommendations for Other Services OT consult   Frequency 7X/week    Precautions / Restrictions Precautions Precautions: Back Precaution Booklet Issued: Yes (comment) Precaution Comments: instructed pt/husband in log roll and reviewed back precautions Restrictions Weight Bearing Restrictions: No   Pertinent Vitals/Pain **7/10 back with walking premedicated*      Mobility  Bed Mobility Bed Mobility: Sit to Supine;Right Sidelying to Sit Right Sidelying to Sit: 5: Set up;With rails Sit to Supine: 4: Min assist Details for Bed Mobility Assistance: min A for LEs into bed Transfers Transfers: Sit to Stand;Stand to Sit Sit to Stand: 5: Supervision;From chair/3-in-1;With upper extremity assist Stand to Sit: 5: Supervision;To bed;With upper extremity assist Ambulation/Gait Ambulation/Gait Assistance: 4: Min guard Ambulation Distance (Feet): 100 Feet Assistive device: Rolling walker Gait Pattern: Within Functional Limits Gait velocity: decreased General Gait Details: steady, no LOB    Exercises     PT Diagnosis: Difficulty walking;Acute  pain  PT Problem List: Decreased activity tolerance;Decreased mobility;Decreased knowledge of use of DME;Decreased knowledge of precautions;Pain PT Treatment Interventions: DME instruction;Gait training;Stair training;Functional mobility training;Therapeutic activities;Therapeutic exercise;Patient/family education     PT Goals(Current goals can be found in the care plan section) Acute Rehab PT Goals Patient Stated Goal: get home and be able to do for my self  PT Goal Formulation: With patient/family Time For Goal Achievement: 05/16/13 Potential to Achieve Goals: Good  Visit Information  Last PT Received On: 05/02/13 Assistance Needed: +1 History of Present Illness: 79 y.o. female with h/o fusion at L5-S1, fibromyalgia admitted for  L3-4 microdiscectomy on L.        Prior Functioning  Home Living Family/patient expects to be discharged to:: Private residence Living Arrangements: Spouse/significant other Available Help at Discharge: Family Type of Home: House (split level- 3 stairs) Home Access: Stairs to enter [employer] of Steps: 1 Home Layout: Two level Home Equipment: Hand held shower head;Walker - 2 wheels;Cane - single point Prior Function Level of Independence: Independent Communication Communication: No difficulties    Cognition  Cognition Arousal/Alertness: Awake/alert Behavior During Therapy: WFL for tasks assessed/performed Overall Cognitive Status: Within Functional Limits for tasks assessed    Extremity/Trunk Assessment Upper Extremity Assessment Upper Extremity Assessment: Generalized weakness Lower Extremity Assessment Lower Extremity Assessment: Overall WFL for tasks assessed Cervical / Trunk Assessment Cervical / Trunk Assessment: Normal   Balance Balance Balance Assessed: Yes Static Sitting Balance Static Sitting - Balance Support: No upper extremity supported;Feet supported Static Sitting - Level of Assistance: 6: Modified independent  (Device/Increase time) Static Sitting - Comment/# of Minutes: 2  End of Session PT - End of Session Equipment Utilized During  Treatment: Gait belt Activity Tolerance: Patient tolerated treatment well Patient left: in bed;with call bell/phone within reach Nurse Communication: Mobility status  GP     Robin Juarez 05/02/2013, 11:15 AM [phone]

## 2013-05-02 NOTE — Progress Notes (Signed)
Subjective: 1 Day Post-Op Procedure(s) (LRB): COMPLETE DECOMPRESSIVE LUMBAR LAMINECTOMY L3-L4 MICRODISCECTOMY L3-L4 ON THE LEFT (N/A) Patient reports pain as 4 on 0-10 scale.  Doing better but her legs are weak,but better than she was Pre-Op. Will have PT work with her and DC tomorrow..  Objective: Vital signs in last 24 hours: Temp:  [97.3 F (36.3 C)-98.9 F (37.2 C)] 97.6 F (36.4 C) (09/12 0450) Pulse Rate:  [74-86] 74 (09/12 0450) Resp:  [9-17] 16 (09/12 0450) BP: (103-163)/(56-83) 125/71 mmHg (09/12 0450) SpO2:  [93 %-100 %] 94 % (09/12 0450) Weight:  [82.441 kg (181 lb 12 oz)] 82.441 kg (181 lb 12 oz) (09/11 1750)  Intake/Output from previous day: 09/11 0701 - 09/12 0700 In: 3426.7 [P.O.:720; I.V.:2551.7; IV Piggyback:155] Out: 2625 [Urine:2575; Blood:50] Intake/Output this shift:     Recent Labs  04/29/13 1045  HGB 15.0    Recent Labs  04/29/13 1045  WBC 14.5*  RBC 4.57  HCT 44.0  PLT 281    Recent Labs  04/29/13 1045  NA 136  K 4.8  CL 98  CO2 32  BUN 16  CREATININE 0.68  GLUCOSE 100*  CALCIUM 10.0    Recent Labs  04/29/13 1045  INR 0.93    Dorsiflexion/Plantar flexion intact  Assessment/Plan: 1 Day Post-Op Procedure(s) (LRB): COMPLETE DECOMPRESSIVE LUMBAR LAMINECTOMY L3-L4 MICRODISCECTOMY L3-L4 ON THE LEFT (N/A) Up with therapy. Discharge Plans for tomorrow.  Robin Juarez A 05/02/2013, 7:28 AM

## 2013-05-02 NOTE — Progress Notes (Signed)
Utilization review completed.  

## 2013-05-02 NOTE — Care Management Note (Signed)
  Page 2 of 2   05/02/2013     5:16:50 PM   CARE MANAGEMENT NOTE 05/02/2013  Patient:  Robin Juarez, Robin Juarez   Account Number:  [id number]  Date Initiated:  05/02/2013  Documentation initiated by:  Colleen Can  Subjective/Objective Assessment:   dx Spinal stenosis L3-L4, L4-L5; decompressive lumbar laminectomy & hemilaminectomy     Action/Plan:   CM spoe with patient. Plans arefor her to return to her home in [place] where spouse will be caregiver. She will need 3N1, & RW. Genevieve Norlander will provide HHservices upon discharge.   Anticipated DC Date:  05/03/2013   Anticipated DC Plan:  HOME W HOME HEALTH SERVICES      DC Planning Services  CM consult      [facility] Choice  HOME HEALTH  DURABLE MEDICAL EQUIPMENT   Choice offered to / List presented to:  C-1 Patient      DME agency  [facility]     [facility] arranged  HH-2 PT      [facility] agency  [facility]   Status of service:  In process, will continue to follow Medicare Important Message given?   (If response is "NO", the following Medicare IM given date fields will be blank) Date Medicare IM given:   Date Additional Medicare IM given:    Discharge Disposition:    Per UR Regulation:  Reviewed for med. necessity/level of care/duration of stay  If discussed at Long Length of Stay Meetings, dates discussed:    Comments:  05/02/2013 Colleen Can BSN RN CCM [phone] [facility] notified of DME needs.

## 2013-05-02 NOTE — Op Note (Signed)
Robin Juarez, Robin Juarez            ACCOUNT NO.:  [id number]  MEDICAL RECORD NO.:  [id number]  LOCATION:  1604                         FACILITY:  [facility]  PHYSICIAN:  Georges Lynch. Jenille Laszlo, M.D.DATE OF BIRTH:  08-30-1930  DATE OF PROCEDURE:  05/01/2013 DATE OF DISCHARGE:                              OPERATIVE REPORT   PREOPERATIVE DIAGNOSES: 1. Severe spinal stenosis at L3-4 on the left. 2. Weakness of her left lower extremity. 3. Questionable herniated disk L3-4 on the left versus stenosis. 4. Spinal stenosis at L4-5 secondary to previous surgery.  This is a     recurrent stenosis from previous bone graft in that area.  POSTOPERATIVE DIAGNOSES: 1. Severe spinal stenosis at L3-4 on the left. 2. Weakness of her left lower extremity. 3. Questionable herniated disk L3-4 on the left versus stenosis. 4. Spinal stenosis at L4-5 secondary to previous surgery.  This is a     recurrent stenosis from previous bone graft in that area.  OPERATION: 1. Complete decompressive lumbar laminectomy at L3-4. 2. Hemilaminectomy, complete decompressive lumbar laminectomy at L4-5     on the left. 3. Removal of remaining bone grafted at L4-5 on the left in regard to     causing recess stenosis. 4. Exploration of the L3-4 disk.  SURGEON:  Georges Lynch. Darrelyn Hillock, M.D.  ASSISTANT:  Marlowe Kays, M.D.  PROCEDURE IN DETAIL:  Under general anesthesia, the patient on a spinal frame, routine orthopedic prep and draping was carried out.  She had Cleocin 900 mg.  At this time, the appropriate time-out was carried out. I also marked the appropriate left side of her back as her symptoms in the preop area.  At this time, 2 needles were placed in the back, localization x-ray was taken.  After that, we then continued our draping and the x-ray was taken.  Incision was made over the L3-4, L4-5 space. Bleeders were identified and cauterized.  Note, the incision was carried down to the lamina bilaterally.  The muscle was  stripped from the spinous process and lamina bilaterally.  Great care was taken not to injure the dura.  The microscope was brought in and we then proceeded with another x-ray.  We then removed the spinous process of L3, part of L2.  We then removed the part of the spinous processes that was remaining of L4.  We then noted there was a marked amount of scar tissue at L4-5 region extending up in the L3-4 and it looked like that this was the previous area where she had the bone graft done 30 years ago.  We went out laterally, far laterally, removed that the bone graft was in the way and went down and removed a large amount of scar tissue with a large amount of thick ligamentum flavum.  Following setting up to the L3- 4 level and proximally, we continued to slowly dissect that from the dura.  This was very tedious procedure.  No dural leaks.  At that time after freeing this area up, we continued our laminectomy proximally and distally until we had complete freedom.  We went over centrally and over to the right to completely free up the dura.  We then explored the  L3-4 disk space.  There was no herniated disk.  This was markedly thickened ligamentum flavum and bone that was indenting the dura fat.  When we completed the procedure, we were able to easily pass a hockey-stick distally and proximally over the dura without any problem.  We then out into the foramen as well.  The both foramina were open.  We decompressed the foramina over the L4 and L5 roots as well.  We also went up proximally the L3.  I thoroughly irrigated out the area, loosely applied some thrombin-soaked Gelfoam.  Note another x-ray was taken for labeling purposes and x-ray.  The wound was closed in layers  in usual fashion. I left a small deep distal and proximal part of the wound open for drainage purposes.  I then injected Exparel 20 mL into the muscle. Prior to doing the surgery, I injected the soft tissue area with 30  mL of 0.25% Marcaine and epinephrine to control the bleeding.  Following that, the subcu was closed with Vicryl and skin with metal staples. Sterile dressing was applied.  No Neosporin was applied because she said she was allergic to it.  The patient left the operative room in satisfactory condition.          ______________________________ Georges Lynch Darrelyn Hillock, M.D.     RAG/MEDQ  D:  05/01/2013  T:  05/02/2013  Job:  [id number]

## 2013-05-03 DIAGNOSIS — M79609 Pain in unspecified limb: Secondary | ICD-10-CM

## 2013-05-03 MED ORDER — OXYCODONE HCL 5 MG PO TABS: 10.0000 mg | ORAL_TABLET | Freq: Once | ORAL | Status: DC

## 2013-05-03 MED ORDER — HYDROMORPHONE HCL 2 MG PO TABS: 4.0000 mg | ORAL_TABLET | Freq: Once | ORAL | Status: DC

## 2013-05-03 NOTE — Progress Notes (Signed)
VASCULAR LAB PRELIMINARY  PRELIMINARY  PRELIMINARY  PRELIMINARY  Left lower extremity venous duplex completed.    Preliminary report:  Left:  No evidence of DVT, superficial thrombosis, or Baker's cyst.  Jaslynne Dahan, RVT 05/03/2013, 12:04 PM

## 2013-05-03 NOTE — Progress Notes (Signed)
Subjective: Patient complaining of left leg pain.  Denies cp, sob.  No bowel or bladder incontinence.    Objective: Vital signs in last 24 hours: Temp:  [98.4 F (36.9 C)-99.9 F (37.7 C)] 99.7 F (37.6 C) (09/13 0515) Pulse Rate:  [81-98] 98 (09/13 0515) Resp:  [16] 16 (09/13 0515) BP: (100-129)/(65-67) 107/67 mmHg (09/13 0515) SpO2:  [92 %-93 %] 93 % (09/13 0515)  Intake/Output from previous day: 09/12 0701 - 09/13 0700 In: 1708.3 [P.O.:720; I.V.:988.3] Out: 1650 [Urine:1650] Intake/Output this shift: Total I/O In: 120 [P.O.:120] Out: -   No results found for this basename: HGB,  in the last 72 hours No results found for this basename: WBC, RBC, HCT, PLT,  in the last 72 hours No results found for this basename: NA, K, CL, CO2, BUN, CREATININE, GLUCOSE, CALCIUM,  in the last 72 hours No results found for this basename: LABPT, INR,  in the last 72 hours  Exam:  Back wound looks good.  No drainage or signs of infection.  Left calf mod tender.  Nvi.    Assessment/Plan: Venous doppler to r/o dvt this morning.  Possible d/c home today if neg depending on how she does with therapy.    Robin Juarez M 05/03/2013, 10:06 AM

## 2013-05-03 NOTE — Progress Notes (Signed)
Occupational Therapy Treatment Patient Details Name: Robin Juarez MRN: [id number] DOB: [birth year] Today's Date: 05/03/2013 Time: 7829-5621 OT Time Calculation (min): 40 min  OT Assessment / Plan / Recommendation  History of present illness 79 y.o. female with h/o fusion at L5-S1, fibromyalgia admitted for  L3-4 microdiscectomy on L.    OT comments  Pt groggy at times during this session and having some difficulty with her balance at times. Spouse present for session. ? Groggy due to meds? She will benefit from additional OT to help increase her independence with ADL and safety.   Follow Up Recommendations  Home health OT;Supervision/Assistance - 24 hour    Barriers to Discharge       Equipment Recommendations  3 in 1 bedside comode    Recommendations for Other Services    Frequency Min 2X/week   Progress towards OT Goals Progress towards OT goals:  (pt groggy this session and some difficulty with balance)  Plan Discharge plan remains appropriate    Precautions / Restrictions Precautions Precautions: Back Precaution Comments: pt able to state 2/3 precautions. reviewed all with pt and spouse Restrictions Weight Bearing Restrictions: No   Pertinent Vitals/Pain 4/10 with activity. Reposition, premedicated.    ADL  Toilet Transfer: Performed;Minimal assistance (pt unsteady at times. appears groggy. ? meds) Toilet Transfer Method:  (with walker into bathroom) Toilet Transfer Equipment: Raised toilet seat with arms (or 3-in-1 over toilet) Toileting - Clothing Manipulation and Hygiene: Performed;Maximal assistance (see notes below) Where Assessed - Toileting Clothing Manipulation and Hygiene: Sit to stand from 3-in-1 or toilet Equipment Used: Sock aid;Long-handled sponge;Long-handled shoe horn;Reacher;Rolling walker Transfers/Ambulation Related to ADLs: Pt groggy during session and closing eyes periodically--? due to meds. Pt did well with transfer into bathroom but then when  she stood from 3in1 to pull up pants, she became unsteady and required more assist. Pt states she starting feel weak but was able to recover and support herself better after a moment. She will need a toilet aid as she cant perform hygiene without breaking back precautions. Educated pt and spouse on where to obtain toilet aid. Demonstrated toilet aid use for them. Pt needed assist to help pull up pants and support for balance in standing. Also demonstrated AE for LB ADL and pt has all but the sock aid. Husband states he may decide to purchase sock aid also. Discussed sponge bathing initially for safety and letting HHOT assess shower transfer.     OT Diagnosis:    OT Problem List:   OT Treatment Interventions:     OT Goals(current goals can now be found in the care plan section) Acute Rehab OT Goals Patient Stated Goal: get home and be able to do for my self   Visit Information  Last OT Received On: 05/03/13 Assistance Needed: +1 History of Present Illness: 79 y.o. female with h/o fusion at L5-S1, fibromyalgia admitted for  L3-4 microdiscectomy on L.     Subjective Data      Prior Functioning       Cognition  Cognition Arousal/Alertness: Lethargic (groggy at times) Behavior During Therapy: WFL for tasks assessed/performed Overall Cognitive Status: Within Functional Limits for tasks assessed    Mobility  Bed Mobility Bed Mobility: Rolling Right;Right Sidelying to Sit Rolling Right: 4: Min assist Right Sidelying to Sit: 4: Min assist;3: Mod assist;HOB flat Sit to Supine: 4: Min assist Details for Bed Mobility Assistance: verbal cues on technique for log roll and increased time.  Transfers Transfers: Sit to Stand;Stand to  Sit Sit to Stand: 4: Min assist;With upper extremity assist;From bed;From chair/3-in-1 Stand to Sit: 4: Min assist;With upper extremity assist;To chair/3-in-1 Details for Transfer Assistance: verbal cues for technique for back precautions and hand placement.     Exercises      Balance Balance Balance Assessed: Yes Dynamic Standing Balance Dynamic Standing - Balance Support: No upper extremity supported Dynamic Standing - Level of Assistance: 4: Min assist;3: Mod assist   End of Session OT - End of Session Equipment Utilized During Treatment: Rolling walker Activity Tolerance: Other (comment) (pt groggy) Patient left: in chair;with call bell/phone within reach;with family/visitor present  GO     Lennox Laity [phone] 05/03/2013, 4:11 PM

## 2013-05-03 NOTE — Progress Notes (Signed)
Physical Therapy Treatment Patient Details Name: CEIRRA BELLI MRN: [id number] DOB: [birth year] Today's Date: 05/03/2013 Time: 1415-1440 PT Time Calculation (min): 25 min  PT Assessment / Plan / Recommendation  History of Present Illness 79 y.o. female with h/o fusion at L5-S1, fibromyalgia admitted for  L3-4 microdiscectomy on L.    PT Comments   Pm session assisted pt OOB using "Log Roll" tech and instruction.  Practiced going up/down 2 steps with one rail with spouse present.  Amb in hallway.  Pt appears slow and groggy.   Follow Up Recommendations  Home health PT     Does the patient have the potential to tolerate intense rehabilitation     Barriers to Discharge        Equipment Recommendations  Rolling walker with 5" wheels;3in1 (PT)    Recommendations for Other Services    Frequency 7X/week   Progress towards PT Goals Progress towards PT goals: Progressing toward goals  Plan      Precautions / Restrictions Precautions Precautions: Back Precaution Comments: Instructed pt and spouse on back percautions and log roll tech with repeat VC's as pt appears groggy and slow (meds?) Restrictions Weight Bearing Restrictions: No    Pertinent Vitals/Pain No c/o pain    Mobility  Bed Mobility Bed Mobility: Rolling Right;Right Sidelying to Sit Rolling Right: 4: Min guard;4: Min assist Right Sidelying to Sit: 4: Min assist;4: Min guard Sit to Supine: 4: Min assist Details for Bed Mobility Assistance: 75% VC's on proper "log roll" tech and increased time to perform .  Spouse very helpful and remembers instructions. Transfers Transfers: Sit to Stand;Stand to Sit Sit to Stand: 5: Supervision;4: Min guard;From bed Stand to Sit: 5: Supervision;To bed;With upper extremity assist Details for Transfer Assistance: 50% Vc's on proper tech and hand placement to increase self assist Ambulation/Gait Ambulation/Gait Assistance: 4: Min guard Ambulation Distance (Feet): 85  Feet Assistive device: Rolling walker Ambulation/Gait Assistance Details: 25% VC's on safety with turns and increased time. Gait Pattern: Step-through pattern;Decreased stride length Gait velocity: decreased Stairs: Yes Stairs Assistance: 4: Min assist Stairs Assistance Details (indicate cue type and reason): with spouse and one rail Stair Management Technique: One rail Right;Forwards Number of Stairs: 2    PT Goals (current goals can now be found in the care plan section)    Visit Information  Last PT Received On: 05/03/13 Assistance Needed: +1 History of Present Illness: 79 y.o. female with h/o fusion at L5-S1, fibromyalgia admitted for  L3-4 microdiscectomy on L.     Subjective Data      Cognition       Balance     End of Session PT - End of Session Equipment Utilized During Treatment: Gait belt Activity Tolerance: Patient limited by fatigue;Patient limited by pain Patient left: in bed;with call bell/phone within reach;with family/visitor present   Felecia Shelling  PTA [facility]  Acute  Rehab Pager      [phone]

## 2013-05-03 NOTE — Progress Notes (Signed)
Physical Therapy Treatment Patient Details Name: ZORI BENBROOK MRN: [id number] DOB: [birth year] Today's Date: 05/03/2013 Time: 4098-1191 PT Time Calculation (min): 38 min  PT Assessment / Plan / Recommendation  History of Present Illness 79 y.o. female with h/o fusion at L5-S1, fibromyalgia admitted for  L3-4 microdiscectomy on L.    PT Comments   Educated pt on her back precautions and "Log Roll" tech when assisting OOB.  Amb to BR and assisted with hygiene.  Educated on safety during turns and proper walker to self distance.  Amb back to bed per pt request due to increased c/o pain and fatigue.      Follow Up Recommendations  Home health PT     Does the patient have the potential to tolerate intense rehabilitation     Barriers to Discharge        Equipment Recommendations  Rolling walker with 5" wheels;3in1 (PT) ([employer] RW)    Recommendations for Other Services    Frequency 7X/week   Progress towards PT Goals Progress towards PT goals: Progressing toward goals  Plan      Precautions / Restrictions Precautions Precautions: Back Precaution Comments: Instructed pt and spouse on back percautions and log roll tech Restrictions Weight Bearing Restrictions: No    Pertinent Vitals/Pain C/o 7/10 pain Repositioned Just received meds     Mobility  Bed Mobility Bed Mobility: Rolling Right;Right Sidelying to Sit Rolling Right: 4: Min guard;4: Min assist Right Sidelying to Sit: 4: Min assist;4: Min guard Details for Bed Mobility Assistance: 75% VC's on proper "log roll" tech and increased time to perform  Transfers Transfers: Sit to Stand;Stand to Sit Sit to Stand: 5: Supervision;4: Min guard;From bed;From toilet Details for Transfer Assistance: 50% Vc's on proper tech and hand placement to increase self assist Ambulation/Gait Ambulation/Gait Assistance: 4: Min guard Ambulation Distance (Feet): 30 Feet (15' x 2 to and from BR) Assistive device: Rolling  walker Ambulation/Gait Assistance Details: 25% VC's on safety with turns and to avoid twisting during turns.  Gait Pattern: Step-through pattern;Decreased stride length Gait velocity: decreased     PT Goals (current goals can now be found in the care plan section)    Visit Information  Last PT Received On: 05/03/13 Assistance Needed: +1 History of Present Illness: 79 y.o. female with h/o fusion at L5-S1, fibromyalgia admitted for  L3-4 microdiscectomy on L.     Subjective Data      Cognition       Balance     End of Session PT - End of Session Equipment Utilized During Treatment: Gait belt Activity Tolerance: Patient limited by fatigue;Patient limited by pain Patient left: in bed;with call bell/phone within reach;with family/visitor present   Felecia Shelling  PTA [facility]  Acute  Rehab Pager      [phone]

## 2013-05-03 NOTE — Progress Notes (Signed)
Discharged from floor via w/c, spouse with pt. No changes in assessment. Lora Glomski   

## 2013-05-05 DIAGNOSIS — M545 Low back pain, unspecified: Secondary | ICD-10-CM | POA: Diagnosis not present

## 2013-05-05 DIAGNOSIS — F329 Major depressive disorder, single episode, unspecified: Secondary | ICD-10-CM | POA: Diagnosis not present

## 2013-05-05 DIAGNOSIS — R269 Unspecified abnormalities of gait and mobility: Secondary | ICD-10-CM | POA: Diagnosis not present

## 2013-05-05 DIAGNOSIS — F3289 Other specified depressive episodes: Secondary | ICD-10-CM | POA: Diagnosis not present

## 2013-05-05 DIAGNOSIS — Z4789 Encounter for other orthopedic aftercare: Secondary | ICD-10-CM | POA: Diagnosis not present

## 2013-05-05 DIAGNOSIS — IMO0001 Reserved for inherently not codable concepts without codable children: Secondary | ICD-10-CM | POA: Diagnosis not present

## 2013-05-06 NOTE — Discharge Summary (Signed)
Physician Discharge Summary   Patient ID: Robin Juarez MRN: [id number] DOB/AGE: 01-10-1931 79 y.o.  Admit date: 05/01/2013 Discharge date: 05/03/2013  Primary Diagnosis: Lumbar spinal stenosis  Admission Diagnoses:  Past Medical History  Diagnosis Date  . Hyperlipidemia   . GERD (gastroesophageal reflux disease)   . Osteoporosis   . Arthritis   . Fibromyalgia   . UTI (lower urinary tract infection)   . Urinary incontinence   . Depression 1993, 2000  . Blood transfusion without reported diagnosis     37 yrs ago -s/p back surgery  . OSA (obstructive sleep apnea)     dx 2010-had a cpap-does not use-"uses special mouthpiece now"  . H/O hiatal hernia    Discharge Diagnoses:   Active Problems:   Spinal stenosis, lumbar region, with neurogenic claudication  Estimated body mass index is 34.36 kg/(m^2) as calculated from the following:   Height as of this encounter: 5\' 1"  (1.549 m).   Weight as of this encounter: 82.441 kg (181 lb 12 oz).  Procedure:  Procedure(s) (LRB): COMPLETE DECOMPRESSIVE LUMBAR LAMINECTOMY L3-L4 MICRODISCECTOMY L3-L4 ON THE LEFT (N/A)   Consults: None  HPI: Robin Juarez is a 79 year old female who presented with the chief complaint of back pain. She has had pain in the back for several years. She had a previous fusion by Dr. Fraser Din at L5-S1 for a pseudospondylolisthesis. She started to an increase in back pain earlier this year, in addition to several falls from weakness in her legs. She denies numbness and tingling in the legs. No change in bladder or bowel function.   Laboratory Data: Hospital Outpatient Visit on 04/29/2013  Component Date Value Range Status  . aPTT 04/29/2013 29  24 - 37 seconds Final  . Sodium 04/29/2013 136  135 - 145 mEq/L Final  . Potassium 04/29/2013 4.8  3.5 - 5.1 mEq/L Final  . Chloride 04/29/2013 98  96 - 112 mEq/L Final  . CO2 04/29/2013 32  19 - 32 mEq/L Final  . Glucose, Bld 04/29/2013 100* 70 - 99 mg/dL Final  . BUN  32/44/0102 16  6 - 23 mg/dL Final  . Creatinine, Ser 04/29/2013 0.68  0.50 - 1.10 mg/dL Final  . Calcium 72/53/6644 10.0  8.4 - 10.5 mg/dL Final  . Total Protein 04/29/2013 7.3  6.0 - 8.3 g/dL Final  . Albumin 03/47/4259 3.9  3.5 - 5.2 g/dL Final  . AST 56/38/7564 23  0 - 37 U/L Final  . ALT 04/29/2013 30  0 - 35 U/L Final  . Alkaline Phosphatase 04/29/2013 91  39 - 117 U/L Final  . Total Bilirubin 04/29/2013 0.4  0.3 - 1.2 mg/dL Final  . GFR calc non Af Amer 04/29/2013 81* >90 mL/min Final  . GFR calc Af Amer 04/29/2013 >90  >90 mL/min Final   Comment: (NOTE)                          The eGFR has been calculated using the CKD EPI equation.                          This calculation has not been validated in all clinical situations.                          eGFR's persistently <90 mL/min signify possible Chronic Kidney  Disease.  [name] Prothrombin Time 04/29/2013 12.3  11.6 - 15.2 seconds Final  . INR 04/29/2013 0.93  0.00 - 1.49 Final  . Color, Urine 04/29/2013 YELLOW  YELLOW Final  . APPearance 04/29/2013 CLEAR  CLEAR Final  . Specific Gravity, Urine 04/29/2013 1.013  1.005 - 1.030 Final  . pH 04/29/2013 7.0  5.0 - 8.0 Final  . Glucose, UA 04/29/2013 NEGATIVE  NEGATIVE mg/dL Final  . Hgb urine dipstick 04/29/2013 NEGATIVE  NEGATIVE Final  . Bilirubin Urine 04/29/2013 NEGATIVE  NEGATIVE Final  . Ketones, ur 04/29/2013 NEGATIVE  NEGATIVE mg/dL Final  . Protein, ur 16/05/9603 NEGATIVE  NEGATIVE mg/dL Final  . Urobilinogen, UA 04/29/2013 0.2  0.0 - 1.0 mg/dL Final  . Nitrite 54/04/8118 NEGATIVE  NEGATIVE Final  . Leukocytes, UA 04/29/2013 NEGATIVE  NEGATIVE Final   MICROSCOPIC NOT DONE ON URINES WITH NEGATIVE PROTEIN, BLOOD, LEUKOCYTES, NITRITE, OR GLUCOSE <1000 mg/dL.  . WBC 04/29/2013 14.5* 4.0 - 10.5 K/uL Final  . RBC 04/29/2013 4.57  3.87 - 5.11 MIL/uL Final  . Hemoglobin 04/29/2013 15.0  12.0 - 15.0 g/dL Final  . HCT 14/78/2956 44.0  36.0 - 46.0 % Final  . MCV  04/29/2013 96.3  78.0 - 100.0 fL Final  . MCH 04/29/2013 32.8  26.0 - 34.0 pg Final  . MCHC 04/29/2013 34.1  30.0 - 36.0 g/dL Final  . RDW 21/30/8657 13.4  11.5 - 15.5 % Final  . Platelets 04/29/2013 281  150 - 400 K/uL Final  . MRSA, PCR 04/29/2013 NEGATIVE  NEGATIVE Final  . Staphylococcus aureus 04/29/2013 NEGATIVE  NEGATIVE Final   Comment:                                 The Xpert SA Assay (FDA                          approved for NASAL specimens                          in patients over 43 years of age),                          is one component of                          a comprehensive surveillance                          program.  Test performance has                          been validated by [employer] for patients greater                          than or equal to 66 year old.                          It is not intended  to diagnose infection nor to                          guide or monitor treatment.  Admission on 04/17/2013, Discharged on 04/17/2013  Component Date Value Range Status  . Color, Urine 04/17/2013 YELLOW  YELLOW Final  . APPearance 04/17/2013 CLOUDY* CLEAR Final  . Specific Gravity, Urine 04/17/2013 1.025  1.005 - 1.030 Final  . pH 04/17/2013 5.5  5.0 - 8.0 Final  . Glucose, UA 04/17/2013 NEGATIVE  NEGATIVE mg/dL Final  . Hgb urine dipstick 04/17/2013 NEGATIVE  NEGATIVE Final  . Bilirubin Urine 04/17/2013 NEGATIVE  NEGATIVE Final  . Ketones, ur 04/17/2013 NEGATIVE  NEGATIVE mg/dL Final  . Protein, ur 86/57/8469 NEGATIVE  NEGATIVE mg/dL Final  . Urobilinogen, UA 04/17/2013 0.2  0.0 - 1.0 mg/dL Final  . Nitrite 62/95/2841 NEGATIVE  NEGATIVE Final  . Leukocytes, UA 04/17/2013 MODERATE* NEGATIVE Final  . WBC, UA 04/17/2013 11-20  <3 WBC/hpf Final  . Squamous Epithelial / LPF 04/17/2013 MANY* RARE Final  . Urine-Other 04/17/2013 MUCOUS PRESENT   Final  Lab on 03/17/2013  Component Date Value Range Status    . Sodium 03/17/2013 140  135 - 145 mEq/L Final  . Potassium 03/17/2013 4.0  3.5 - 5.1 mEq/L Final  . Chloride 03/17/2013 105  96 - 112 mEq/L Final  . CO2 03/17/2013 29  19 - 32 mEq/L Final  . Glucose, Bld 03/17/2013 99  70 - 99 mg/dL Final  . BUN 32/44/0102 14  6 - 23 mg/dL Final  . Creatinine, Ser 03/17/2013 0.8  0.4 - 1.2 mg/dL Final  . Total Bilirubin 03/17/2013 0.9  0.3 - 1.2 mg/dL Final  . Alkaline Phosphatase 03/17/2013 76  39 - 117 U/L Final  . AST 03/17/2013 30  0 - 37 U/L Final  . ALT 03/17/2013 26  0 - 35 U/L Final  . Total Protein 03/17/2013 7.3  6.0 - 8.3 g/dL Final  . Albumin 72/53/6644 4.2  3.5 - 5.2 g/dL Final  . Calcium 03/47/4259 9.4  8.4 - 10.5 mg/dL Final  . GFR 56/38/7564 70.35  >60.00 mL/min Final  . Cholesterol 03/17/2013 258* 0 - 200 mg/dL Final   ATP III Classification       Desirable:  < 200 mg/dL               Borderline High:  200 - 239 mg/dL          High:  > = 332 mg/dL  . Triglycerides 03/17/2013 88.0  0.0 - 149.0 mg/dL Final   Normal:  <951 mg/dLBorderline High:  150 - 199 mg/dL  . HDL 03/17/2013 61.30  >39.00 mg/dL Final  . VLDL 88/41/6606 17.6  0.0 - 40.0 mg/dL Final  . Total CHOL/HDL Ratio 03/17/2013 4   Final                  Men          Women1/2 Average Risk     3.4          3.3Average Risk          5.0          4.42X Average Risk          9.6          7.13X Average Risk          15.0          11.0                      .  Direct LDL 03/17/2013 180.8   Final   Optimal:  <100 mg/dLNear or Above Optimal:  100-129 mg/dLBorderline High:  130-159 mg/dLHigh:  160-189 mg/dLVery High:  >190 mg/dL     X-Rays:Dg Lumbar Spine 2-3 Views  04/29/2013   *RADIOLOGY REPORT*  Clinical Data: Spinal stenosis  LUMBAR SPINE - 2-3 VIEW  Comparison: 04/22/2013  Findings: Five lumbar type vertebral bodies are well visualized. There are changes consistent with prior laminectomy at L5.  The numbering nomenclature is similar to that used on the prior MRI examination.  Multilevel  disc space narrowing is seen.  Osteophytic changes are noted as well.  Anterolisthesis of L5 on S1 is stable.  IMPRESSION: Multilevel degenerative changes.  Numbering as performed previously on MRI examination.   Original Report Authenticated By: Alcide Clever, M.D.   Dg Lumbar Spine Complete  04/17/2013   *RADIOLOGY REPORT*  Clinical Data: Low back pain radiating into both legs.  LUMBAR SPINE - COMPLETE 4+ VIEW  Comparison: Plain films lumbar spine 07/04/2010 and MRI lumbar spine 07/13/2010.  Findings: There is unchanged 1.2 cm anterolisthesis of L5 on S1 due to bilateral pars interarticularis defect.  The patient is status post L5-S1 decompression.  Alignment is otherwise normal. Vertebral body height is maintained.  Multilevel loss of disc space height appearing worst at L2-3 is stable in appearance.  IMPRESSION:  1.  No acute finding. 2.  Unchanged anterolisthesis of L5 on S1 due to pars defects.  The patient is status post posterior decompression at this level. 3.  Multilevel degenerative disease appearing worst at L2-3 without marked interval change.   Original Report Authenticated By: Holley Dexter, M.D.   Mr Lumbar Spine Wo Contrast  04/22/2013   *RADIOLOGY REPORT*  Clinical Data: Low back pain and pain left side for past week. Surgery 35 years ago.  MRI LUMBAR SPINE WITHOUT CONTRAST  Technique:  Multiplanar and multiecho pulse sequences of the lumbar spine were obtained without intravenous contrast.  Comparison: 04/17/2013 plain film examination.  07/13/2010 MR.  Findings: Last fully open disc space is labeled L5-S1.  Present examination incorporates from T11-12 disc space through lower sacrum.  Conus L1-2 level.  Sub centimeter right renal cysts suspected.  T11-12:  Tiny Schmorl's node deformities superior plate Q03.  K74-Q5:  Shallow protrusion with slight cephalad extension left paracentral position.  No associated mass effect.  L1-2:  Facet joint degenerative changes.  Bulge/broad-based protrusion  with greatest extension lateral position approaching but not causing significant compression of the exiting nerve roots. Mild spinal stenosis most notable right lateral position.  L2-3:  Prominent disc degeneration with broad-based disc osteophyte complex with lateral extension more notable on the left with encroachment upon the exiting L2 nerve roots greater on the left. Facet joint degenerative changes and ligamentum flavum hypertrophy greater on the left.  Impression upon the left lateral aspect of the thecal sac.  Moderate spinal stenosis.  L3-4:  Prominent facet joint degenerative changes greater on the left.  Broad-based left posterior lateral/foraminal disc protrusion. Marked left lateral recess stenosis and left-sided spinal stenosis.  Mild to moderate right-sided spinal stenosis. Left lateral extension of disc osteophyte with mass effect upon the exiting left L3 nerve root.  Additionally, there is a moderate size extruded left posterior lateral cephalad extending disc fragment reaching the upper L3 level medial to the left L3 pedicle with impression upon the thecal sac.  L4-5:  Prior fusion facet joints.  No significant spinal stenosis or foraminal narrowing.  L5-S1:  Bilateral pars defects.  8 mm anterior slip of L5.  Broad- based protrusion with extension to the neural foramen greater on the right with compression of the exiting L5 nerve roots greater on the right.  Prior laminectomy.  No significant spinal stenosis.  IMPRESSION:  Since the prior examination, most significant change has occurred at the L3-4 level.  Summary of pertinent findings includes:  L3-4 multifactorial marked left lateral recess stenosis and left- sided spinal stenosis.  Mild to moderate right-sided spinal stenosis.  Left lateral extension of disc osteophyte with mass effect upon the exiting left L3 nerve root.  Additionally, there is a moderate size extruded left posterior lateral cephalad extending disc fragment reaching the upper  L3 level medial to the left L3 pedicle with impression upon the thecal sac.  L5-S1 bilateral pars defects.  8 mm anterior slip of L5.  Broad- based protrusion with extension into the neural foramen greater on the right with compression of the exiting L5 nerve roots greater on the right.  L2-3 broad-based disc osteophyte complex with lateral extension more notable on the left with encroachment upon the exiting L2 nerve roots greater on the left.  Facet joint degenerative changes and ligamentum flavum hypertrophy greater on the left.  Impression upon the left lateral aspect of the thecal sac.  Moderate spinal stenosis.  Please see above for further detail.   Original Report Authenticated By: Lacy Duverney, M.D.   Dg Spine Portable 1 View  05/01/2013   *RADIOLOGY REPORT*  Clinical Data: Lumbar decompression at L3-4  PORTABLE SPINE - 1 VIEW  Comparison: Known from earlier in the same day.  Findings: Surgical instruments are now noted at the L3-4 interspace.   Original Report Authenticated By: Alcide Clever, M.D.   Dg Spine Portable 1 View  05/01/2013   *RADIOLOGY REPORT*  Clinical Data: L3-4 decompression  PORTABLE SPINE - 1 VIEW  Comparison: Similar film from earlier in the same day.  Findings: Number nomenclature is similar to that seen on prior plain film and MRI examination.  Surgical instruments are noted in the soft tissues posterior to the L3 and L4 vertebral bodies. Anterolisthesis of the L5 on S1 is again seen.   Original Report Authenticated By: Alcide Clever, M.D.   Dg Spine Portable 1 View  05/01/2013   CLINICAL DATA:  L3-4 surgical level.  EXAM: PORTABLE SPINE - 1 VIEW  COMPARISON:  04/29/2013.  FINDINGS: Posterior needles are directed along the superior and inferior aspect of the L3 spinous process.  IMPRESSION: Intraoperative imaging as above.   Electronically Signed   By: Charlett Nose M.D.   On: 05/01/2013 14:34    EKG: Orders placed in visit on 04/28/13  . EKG 12-LEAD     Hospital Course:  YAZMEN BRIONES is a 79 y.o. who was admitted to [facility]. They were brought to the operating room on 05/01/2013 and underwent Procedure(s): COMPLETE DECOMPRESSIVE LUMBAR LAMINECTOMY L3-L4 MICRODISCECTOMY L3-L4 ON THE LEFT.  Patient tolerated the procedure well and was later transferred to the recovery room and then to the orthopaedic floor for postoperative care.  They were given PO and IV analgesics for pain control following their surgery.  They were given 24 hours of postoperative antibiotics of  Anti-infectives   Start     Dose/Rate Route Frequency Ordered Stop   05/01/13 2200  clindamycin (CLEOCIN) IVPB 600 mg     600 mg 100 mL/hr over 30 Minutes Intravenous 3 times per day 05/01/13 1740 05/02/13 0637   05/01/13 1324  polymyxin B 500,000 Units, bacitracin 50,000 Units in sodium chloride irrigation 0.9 % 500 mL irrigation  Status:  Discontinued       As needed 05/01/13 1325 05/01/13 1559   05/01/13 1044  clindamycin (CLEOCIN) IVPB 900 mg     900 mg 100 mL/hr over 30 Minutes Intravenous On call to O.R. 05/01/13 1044 05/01/13 1332     and started on DVT prophylaxis in the form of Aspirin.   PT was ordered.  Discharge planning consulted to help with postop disposition and equipment needs.  Patient had a fair night on the evening of surgery.  They started to get up OOB with therapy on day one. She started having some pain in her left calf post op day 2. Doppler was negative for DVT. Continued to work with therapy into day two.  Dressing was changed on day two and the incision was clean and dry. Patient was seen in rounds and was ready to go home.   Discharge Medications: Prior to Admission medications   Medication Sig Start Date End Date Taking? Authorizing Provider  aspirin EC 81 MG tablet Take 81 mg by mouth at bedtime.   Yes Historical Provider, MD  cholecalciferol (VITAMIN D) 1000 UNITS tablet Take 1,000 Units by mouth daily.     Yes Historical Provider, MD  co-enzyme Q-10 30  MG capsule Take 30 mg by mouth daily.    Yes Historical Provider, MD  DULoxetine (CYMBALTA) 60 MG capsule Take 60 mg by mouth every morning.    Yes Historical Provider, MD  folic acid (FOLVITE) 800 MCG tablet Take 800 mcg by mouth daily.    Yes Historical Provider, MD  gabapentin (NEURONTIN) 300 MG capsule Take 300 mg by mouth 3 (three) times daily.    Yes Historical Provider, MD  ibuprofen (ADVIL,MOTRIN) 200 MG tablet Take 400 mg by mouth every 8 (eight) hours as needed for pain.   Yes Historical Provider, MD  lamoTRIgine (LAMICTAL) 100 MG tablet Take 100 mg by mouth at bedtime.   Yes Historical Provider, MD  OLANZapine (ZYPREXA) 5 MG tablet Take 2.5 mg by mouth at bedtime.    Yes Historical Provider, MD  omega-3 acid ethyl esters (LOVAZA) 1 G capsule Take 1 g by mouth daily.   Yes Historical Provider, MD  HYDROmorphone (DILAUDID) 2 MG tablet Take 1 tablet (2 mg total) by mouth every 4 (four) hours as needed for pain. 05/02/13   Shad Ledvina Tamala Ser, PA-C  methocarbamol (ROBAXIN) 500 MG tablet Take 1 tablet (500 mg total) by mouth every 6 (six) hours as needed. 05/02/13   Justin Buechner Tamala Ser, PA-C    Diet: Regular diet Activity:WBAT Follow-up:in 2 weeks Disposition - Home Discharged Condition: stable   Discharge Orders   Future Appointments Provider Department Dept Phone   05/15/2013 3:00 PM Coralyn Helling, MD [facility] [phone]   Future Orders Complete By Expires   Call MD / Call 911  As directed    Comments:     If you experience chest pain or shortness of breath, CALL 911 and be transported to the hospital emergency room.  If you develope a fever above 101 F, pus (white drainage) or increased drainage or redness at the wound, or calf pain, call your surgeon's office.   Constipation Prevention  As directed    Comments:     Drink plenty of fluids.  Prune juice may be helpful.  You may use a stool softener, such as Colace (over the counter) 100 mg  twice a day.  Use  MiraLax (over the counter) for constipation as needed.   Diet general  As directed    Discharge instructions  As directed    Comments:     Change your dressing daily. Shower only, no tub bath. Call if any temperatures greater than 101 or any wound complications: [phone] during the day and ask for Dr. Jeannetta Ellis nurse, Mackey [place].   Driving restrictions  As directed    Comments:     No driving for 2 weeks   Increase activity slowly as tolerated  As directed    Lifting restrictions  As directed    Comments:     No lifting       Medication List    STOP taking these medications       HYDROcodone-acetaminophen 5-325 MG per tablet  Commonly known as:  NORCO/VICODIN     predniSONE 20 MG tablet  Commonly known as:  DELTASONE      TAKE these medications       aspirin EC 81 MG tablet  Take 81 mg by mouth at bedtime.     cholecalciferol 1000 UNITS tablet  Commonly known as:  VITAMIN D  Take 1,000 Units by mouth daily.     co-enzyme Q-10 30 MG capsule  Take 30 mg by mouth daily.     DULoxetine 60 MG capsule  Commonly known as:  CYMBALTA  Take 60 mg by mouth every morning.     folic acid 800 MCG tablet  Commonly known as:  FOLVITE  Take 800 mcg by mouth daily.     gabapentin 300 MG capsule  Commonly known as:  NEURONTIN  Take 300 mg by mouth 3 (three) times daily.     HYDROmorphone 2 MG tablet  Commonly known as:  DILAUDID  Take 1 tablet (2 mg total) by mouth every 4 (four) hours as needed for pain.     ibuprofen 200 MG tablet  Commonly known as:  ADVIL,MOTRIN  Take 400 mg by mouth every 8 (eight) hours as needed for pain.     lamoTRIgine 100 MG tablet  Commonly known as:  LAMICTAL  Take 100 mg by mouth at bedtime.     methocarbamol 500 MG tablet  Commonly known as:  ROBAXIN  Take 1 tablet (500 mg total) by mouth every 6 (six) hours as needed.     OLANZapine 5 MG tablet  Commonly known as:  ZYPREXA  Take 2.5 mg by mouth at bedtime.     omega-3 acid ethyl  esters 1 G capsule  Commonly known as:  LOVAZA  Take 1 g by mouth daily.           Follow-up Information   Follow up with GIOFFRE,RONALD A, MD. Schedule an appointment as soon as possible for a visit in 2 weeks.   Specialty:  Orthopedic Surgery   Contact information:   [address] [place] [address] [phone]       Follow up with [facility]. (Home Health Physical Therapy)    Contact information:   [phone]      Signed: Celedonio Savage Dorsie Burich LAUREN 05/06/2013, 10:59 AM

## 2013-05-09 ENCOUNTER — Encounter (HOSPITAL_COMMUNITY): Payer: Self-pay | Admitting: Emergency Medicine

## 2013-05-09 ENCOUNTER — Telehealth: Payer: Self-pay | Admitting: Family Medicine

## 2013-05-09 ENCOUNTER — Emergency Department (HOSPITAL_COMMUNITY)
Admission: EM | Admit: 2013-05-09 | Discharge: 2013-05-09 | Disposition: A | Discharge status: Home / Self Care | Payer: Medicare Other | Attending: Emergency Medicine | Admitting: Emergency Medicine

## 2013-05-09 DIAGNOSIS — R220 Localized swelling, mass and lump, head: Secondary | ICD-10-CM | POA: Insufficient documentation

## 2013-05-09 DIAGNOSIS — G8929 Other chronic pain: Secondary | ICD-10-CM | POA: Insufficient documentation

## 2013-05-09 DIAGNOSIS — M48061 Spinal stenosis, lumbar region without neurogenic claudication: Secondary | ICD-10-CM | POA: Diagnosis not present

## 2013-05-09 DIAGNOSIS — Z862 Personal history of diseases of the blood and blood-forming organs and certain disorders involving the immune mechanism: Secondary | ICD-10-CM | POA: Diagnosis not present

## 2013-05-09 DIAGNOSIS — M545 Low back pain, unspecified: Secondary | ICD-10-CM | POA: Diagnosis not present

## 2013-05-09 DIAGNOSIS — M129 Arthropathy, unspecified: Secondary | ICD-10-CM | POA: Insufficient documentation

## 2013-05-09 DIAGNOSIS — Z79899 Other long term (current) drug therapy: Secondary | ICD-10-CM | POA: Diagnosis not present

## 2013-05-09 DIAGNOSIS — R5381 Other malaise: Secondary | ICD-10-CM | POA: Diagnosis not present

## 2013-05-09 DIAGNOSIS — Z87440 Personal history of urinary (tract) infections: Secondary | ICD-10-CM | POA: Insufficient documentation

## 2013-05-09 DIAGNOSIS — M6281 Muscle weakness (generalized): Secondary | ICD-10-CM | POA: Diagnosis not present

## 2013-05-09 DIAGNOSIS — K219 Gastro-esophageal reflux disease without esophagitis: Secondary | ICD-10-CM | POA: Insufficient documentation

## 2013-05-09 DIAGNOSIS — I10 Essential (primary) hypertension: Secondary | ICD-10-CM | POA: Insufficient documentation

## 2013-05-09 DIAGNOSIS — T7840XA Allergy, unspecified, initial encounter: Secondary | ICD-10-CM

## 2013-05-09 DIAGNOSIS — F329 Major depressive disorder, single episode, unspecified: Secondary | ICD-10-CM | POA: Diagnosis not present

## 2013-05-09 DIAGNOSIS — F3289 Other specified depressive episodes: Secondary | ICD-10-CM | POA: Diagnosis not present

## 2013-05-09 DIAGNOSIS — Z8639 Personal history of other endocrine, nutritional and metabolic disease: Secondary | ICD-10-CM | POA: Insufficient documentation

## 2013-05-09 DIAGNOSIS — Z7982 Long term (current) use of aspirin: Secondary | ICD-10-CM | POA: Insufficient documentation

## 2013-05-09 DIAGNOSIS — IMO0002 Reserved for concepts with insufficient information to code with codable children: Secondary | ICD-10-CM | POA: Insufficient documentation

## 2013-05-09 DIAGNOSIS — R5383 Other fatigue: Secondary | ICD-10-CM | POA: Diagnosis not present

## 2013-05-09 MED ORDER — FAMOTIDINE IN NACL 20-0.9 MG/50ML-% IV SOLN
20.0000 mg | Freq: Once | INTRAVENOUS | Status: AC
Start: 2013-05-09 — End: 2013-05-09
  Administered 2013-05-09: 20 mg via INTRAVENOUS
  Filled 2013-05-09: qty 50

## 2013-05-09 MED ORDER — SODIUM CHLORIDE 0.9 % IV SOLN
Freq: Once | INTRAVENOUS | Status: AC
  Administered 2013-05-09: 16:00:00 via INTRAVENOUS

## 2013-05-09 MED ORDER — DIPHENHYDRAMINE HCL 25 MG PO TABS: 50.0000 mg | ORAL_TABLET | ORAL | Status: DC | PRN

## 2013-05-09 MED ORDER — DIPHENHYDRAMINE HCL 50 MG/ML IJ SOLN
50.0000 mg | Freq: Once | INTRAMUSCULAR | Status: AC
  Administered 2013-05-09: 50 mg via INTRAVENOUS
  Filled 2013-05-09: qty 1

## 2013-05-09 MED ORDER — FAMOTIDINE IN NACL 20-0.9 MG/50ML-% IV SOLN
20.0000 mg | Freq: Once | INTRAVENOUS | Status: DC
  Filled 2013-05-09: qty 50

## 2013-05-09 MED ORDER — PREDNISONE 20 MG PO TABS: ORAL_TABLET | ORAL | Status: DC

## 2013-05-09 MED ORDER — PREDNISONE 20 MG PO TABS
60.0000 mg | ORAL_TABLET | Freq: Once | ORAL | Status: AC
  Administered 2013-05-09: 60 mg via ORAL
  Filled 2013-05-09: qty 3

## 2013-05-09 MED ORDER — FAMOTIDINE 20 MG PO TABS: 20.0000 mg | ORAL_TABLET | Freq: Two times a day (BID) | ORAL | Status: DC

## 2013-05-09 NOTE — Telephone Encounter (Signed)
Agree with ER.

## 2013-05-09 NOTE — ED Notes (Signed)
Pt c/o allergic reaction x1 day.  Daughter reports pt had back surgery x2 weeks.  Reports increased lip swelling since last due to unknown substance, possibly banana bread.  Pt NAD.

## 2013-05-09 NOTE — ED Provider Notes (Signed)
CSN: [id number]     Arrival date & time 05/09/13  1528 History   First MD Initiated Contact with Patient 05/09/13 1538     Chief Complaint  Patient presents with  . Allergic Reaction   (Consider location/radiation/quality/duration/timing/severity/associated sxs/prior Treatment) HPI 79 year old female had recent surgery with the last 2 weeks for chronic low back pain with spinal stenosis and bilateral leg weakness, or back is gradually improving her weakness is gradually improving postoperatively as expected, she now presents with 2 days of lip swelling and swelling to both cheeks of her face possibly to allergic reaction from unknown substance, she has no new closures that she is aware of, no new medications or food, she is no voice change difficulty swallowing or tongue swelling, she is no shortness of breath wheezing cough chest pain abdominal pain vomiting or rash or other concerns. She is no fever no pain no redness no rash no tenderness to her face. She is no generalized rash. Treatment prior to arrival consists of 25 mg oral Benadryl today. Past Medical History  Diagnosis Date  . Hyperlipidemia   . GERD (gastroesophageal reflux disease)   . Osteoporosis   . Arthritis   . Fibromyalgia   . UTI (lower urinary tract infection)   . Urinary incontinence   . Depression 1993, 2000  . Blood transfusion without reported diagnosis     37 yrs ago -s/p back surgery  . OSA (obstructive sleep apnea)     dx 2010-had a cpap-does not use-"uses special mouthpiece now"  . H/O hiatal hernia    Past Surgical History  Procedure Laterality Date  . Cardiovascular stress test  2009    Normal, Dr. Mayford Knife  . Breast biopsy  1995    x 2  . Abdominal hysterectomy  1976  . Back fusions  1977    missing vertebra  . Childbirth  [phone]  . Ovarian cyst removal  1964  . Appendectomy  1964  . Dilatation and currettage  1964  . Cataract extraction, bilateral Bilateral 2005  . Breast biopsy   07/10/2012    Procedure: BREAST BIOPSY WITH NEEDLE LOCALIZATION;  Surgeon: Emelia Loron, MD;  Location: [facility];  Service: General;  Laterality: Right;  . Cystoscopy N/A 11/25/2012    Procedure: CYSTOSCOPY;  Surgeon: Martina Sinner, MD;  Location: [facility];  Service: Urology;  Laterality: N/A;  . Pubovaginal sling N/A 11/25/2012    Procedure: Leonides Grills;  Surgeon: Martina Sinner, MD;  Location: [facility];  Service: Urology;  Laterality: N/A;  . Back surgery      fusions lumbar area  . Lumbar laminectomy/decompression microdiscectomy N/A 05/01/2013    Procedure: COMPLETE DECOMPRESSIVE LUMBAR LAMINECTOMY L3-L4 MICRODISCECTOMY L3-L4 ON THE LEFT;  Surgeon: Jacki Cones, MD;  Location: [facility];  Service: Orthopedics;  Laterality: N/A;   Family History  Problem Relation Age of Onset  . Arthritis Mother   . Kidney disease Maternal Grandmother   . Stroke Father   . Hypertension Father   . Heart disease Brother     Died during CABG  . Hypertension Sister   . Dementia Sister   . Aneurysm Sister     Brain  . Parkinsonism Sister   . Colon cancer Neg Hx   . Breast cancer Neg Hx    History  Substance Use Topics  . Smoking status: Never Smoker   . Smokeless tobacco: Never Used  . Alcohol Use: No   OB History  Grav Para Term Preterm Abortions TAB SAB Ect Mult Living                 Review of Systems 10 Systems reviewed and are negative for acute change except as noted in the HPI. Allergies  Alendronate sodium; Belladonna alk-phenobarbital; Cefaclor; Cephalexin; Chlordiazepoxide; Clavulanic acid; Digoxin; Hydrocodone-acetaminophen; Oxcarbazepine; Simvastatin; Streptomycin; Sulfonamide derivatives; Tetracycline; and Omeprazole  Home Medications   Current Outpatient Rx  Name  Route  Sig  Dispense  Refill  . aspirin 325 MG tablet   Oral   Take 325 mg by mouth daily.         . cholecalciferol (VITAMIN D) 1000  UNITS tablet   Oral   Take 1,000 Units by mouth daily.           [name] co-enzyme Q-10 30 MG capsule   Oral   Take 30 mg by mouth daily.          . DULoxetine (CYMBALTA) 60 MG capsule   Oral   Take 60 mg by mouth every morning.          . folic acid (FOLVITE) 800 MCG tablet   Oral   Take 800 mcg by mouth daily.          [name] gabapentin (NEURONTIN) 300 MG capsule   Oral   Take 300 mg by mouth 3 (three) times daily.          [name] HYDROmorphone (DILAUDID) 2 MG tablet   Oral   Take 1 tablet (2 mg total) by mouth every 4 (four) hours as needed for pain.   60 tablet   0   . lamoTRIgine (LAMICTAL) 100 MG tablet   Oral   Take 100 mg by mouth at bedtime.         . methocarbamol (ROBAXIN) 500 MG tablet   Oral   Take 1 tablet (500 mg total) by mouth every 6 (six) hours as needed.   40 tablet   1   . OLANZapine (ZYPREXA) 5 MG tablet   Oral   Take 2.5 mg by mouth at bedtime.          [name] omega-3 acid ethyl esters (LOVAZA) 1 G capsule   Oral   Take 1 g by mouth daily.         . diphenhydrAMINE (BENADRYL) 25 MG tablet   Oral   Take 2 tablets (50 mg total) by mouth every 4 (four) hours as needed for itching.   20 tablet   0   . famotidine (PEPCID) 20 MG tablet   Oral   Take 1 tablet (20 mg total) by mouth 2 (two) times daily.   6 tablet   0   . predniSONE (DELTASONE) 20 MG tablet      2 tabs po daily x 3 days   6 tablet   0    BP 153/83  Pulse 74  Temp(Src) 98.7 F (37.1 C) (Oral)  Resp 20  SpO2 94% Physical Exam  Nursing note and vitals reviewed. Constitutional:  Awake, alert, nontoxic appearance.  HENT:  Head: Atraumatic.  Mouth/Throat: Oropharynx is clear and moist.  Mild edema to lips and both cheeks, without tenderness erythema increased warmth rash; voice is normal with no stridor no drooling no airway compromise; tongue is normal  Eyes: Right eye exhibits no discharge. Left eye exhibits no discharge.  Neck: Neck supple.  Cardiovascular: Normal  rate and regular rhythm.   No murmur heard. Pulmonary/Chest: Effort normal and breath sounds  normal. No stridor. No respiratory distress. She has no wheezes. She has no rales. She exhibits no tenderness.  Abdominal: Soft. Bowel sounds are normal. There is no tenderness. There is no rebound and no guarding.  Musculoskeletal: She exhibits no edema and no tenderness.  Baseline ROM, no obvious new focal weakness.  Neurological: She is alert.  Mental status and motor strength appears baseline for patient and situation.  Skin: No rash noted.  Psychiatric: She has a normal mood and affect.    ED Course  Procedures (including critical care time) Pt feels improved after observation and/or treatment in ED.Patient / Family / Caregiver informed of clinical course, understand medical decision-making process, and agree with plan. Labs Review Labs Reviewed - No data to display Imaging Review No results found.  MDM   1. Allergic reaction, initial encounter    I doubt any other EMC precluding discharge at this time including, but not necessarily limited to the following:SBI, anaphylaxis.    Hurman Horn, MD 05/10/13 [phone]

## 2013-05-09 NOTE — Telephone Encounter (Signed)
Patient Information:  Caller Name: Margaretha Glassing  Phone: [phone]  Patient: Robin Juarez, Robin Juarez  Gender: Female  DOB: 1931-03-25  Age: 79 Years  PCP: Crawford Givens Clelia Croft) [facility])  Office Follow Up:  Does the office need to follow up with this patient?: No  Instructions For The Office: N/A  RN Note:  Onset of lip swelling and facial swelling after surgery.  Had surgery 05/01/13 and people have been bringing in food for the family.  States no new medications.  States had a chicken wrap, and baked apples from [employer] for lunch, but states no food allergies she knows of.  Had banana bread 05/08/13 and her minister said her lips seemed swollen 05/08/13 as well.  States her face is more swollen now than yesterday.  Per face swelling protocol, advised ED due to "severe swelling of entire face and < 2 hours since exposure to high risk allergen."  Advised ED/[facility]; patient agrees to go to ED.  krs/can  Symptoms  Reason For Call & Symptoms: allergic reaction; lip swelling, facial swelling  Reviewed Health History In EMR: Yes  Reviewed Medications In EMR: Yes  Reviewed Allergies In EMR: Yes  Reviewed Surgeries / Procedures: Yes  Date of Onset of Symptoms: 05/09/2013  Guideline(s) Used:  Face Swelling  Disposition Per Guideline:   Go to ED Now  Reason For Disposition Reached:   SEVERE swelling of entire face and < 2 hours since exposure to high-risk allergen (e.g., peanuts, tree nuts, fish, shellfish or 1st dose of drug) and no serious symptoms AND [4] no serious allergic reaction in the past  Advice Given:  N/A  Patient Will Follow Care Advice:  YES

## 2013-05-15 ENCOUNTER — Ambulatory Visit: Payer: Medicare Other | Admitting: Pulmonary Disease

## 2013-05-15 DIAGNOSIS — S7000XA Contusion of unspecified hip, initial encounter: Secondary | ICD-10-CM | POA: Diagnosis not present

## 2013-05-15 DIAGNOSIS — Z9889 Other specified postprocedural states: Secondary | ICD-10-CM | POA: Diagnosis not present

## 2013-05-20 ENCOUNTER — Telehealth: Payer: Self-pay

## 2013-05-20 NOTE — Telephone Encounter (Signed)
Triage Record Num: [id number] Operator: Kelle Darting Patient Name: Robin Juarez Call Date & Time: 05/19/2013 5:28:43PM Patient Phone: [phone] PCP: Crawford Givens Patient Gender: Female PCP Fax : Patient DOB: 1930-09-12 Practice Name: [facility] Reason for Call: Caller: [name]; PCP: Crawford Givens Clelia Croft) [facility]); CB#: [phone]; Call regarding Diarrhea; Afebrile; Onset: 05/18/13 mid day; Sx notes: Diarrhea since yesterday, has taken two doses of Pepto Bismol and two Antidiarrheal pills today, unsure of help yet; had extensive back surgery on 05/01/13, went from taking Dilaudid to taking Hydrocodone on 05/17/13; has had 4 stools today; Guideline used: Diarrhea or other change in bowel habits; Disposition: Call provider within 72 hours due to sx began after a change in starting a new Rx; Appt. made: No; Pt. voiced understanding of all care advice. Protocol(s) Used: Diarrhea or Other Change in Bowel Habits Recommended Outcome per Protocol: Call Provider within 72 Hours Reason for Outcome: Symptoms began after a change or starting a new prescription or nonprescription medicine or alternative medicine / therapy Care Advice: ~ Call provider if symptoms worsen or new symptoms develop. ~ Gently clean rectal area after bowel movement; avoid harsh cleansing soap or medications. Diarrheal Care: - Drink 2-3 quarts (2-3 liters) per day of low sugar content fluids, including over the counter oral hydration solution, unless directed otherwise by provider. - If accompanied by vomiting, take the fluids in frequent small sips or suck on ice chips. - Eat easily digested foods (such as bananas, rice, applesauce, toast, cooked cereals, soup, crackers, baked or boiled potato, or baked chicken or [place] without skin). - Do not eat high fiber, high fat, high sugar content foods, or highly seasoned foods. - Do not drink caffeinated or alcoholic beverages. - Avoid milk and  milk products while having symptoms. As symptoms improve, gradually add back to diet. - Application of A&D ointment or witch hazel medicated pads may help anal irritation. - Antidiarrheal medications are usually unnecessary. If symptoms are severe, consider nonprescription antidiarrheal and anti-motility drugs as directed by label or a provider. Do not take if have high fever or bloody diarrhea. If pregnant, do not take any medications not approved by your provider. - Consult your provider for advice regarding continuing prescription medication. ~ 09/29/

## 2013-05-22 ENCOUNTER — Encounter: Payer: Self-pay | Admitting: Internal Medicine

## 2013-05-22 ENCOUNTER — Telehealth: Payer: Self-pay | Admitting: Family Medicine

## 2013-05-22 ENCOUNTER — Ambulatory Visit (INDEPENDENT_AMBULATORY_CARE_PROVIDER_SITE_OTHER): Payer: Medicare Other | Admitting: Internal Medicine

## 2013-05-22 VITALS — BP 130/84 | HR 86 | Temp 98.3°F | Wt 172.5 lb

## 2013-05-22 DIAGNOSIS — R197 Diarrhea, unspecified: Secondary | ICD-10-CM | POA: Diagnosis not present

## 2013-05-22 DIAGNOSIS — R110 Nausea: Secondary | ICD-10-CM | POA: Diagnosis not present

## 2013-05-22 MED ORDER — ONDANSETRON HCL 4 MG PO TABS: 4.0000 mg | ORAL_TABLET | Freq: Three times a day (TID) | ORAL | Status: DC | PRN

## 2013-05-22 MED ORDER — PROMETHAZINE HCL 25 MG PO TABS: 12.5000 mg | ORAL_TABLET | Freq: Three times a day (TID) | ORAL | Status: DC | PRN

## 2013-05-22 NOTE — Telephone Encounter (Signed)
Sent. Thanks.   

## 2013-05-22 NOTE — Patient Instructions (Signed)
Diet for Diarrhea, Adult  Frequent, runny stools (diarrhea) may be caused or worsened by food or drink. Diarrhea may be relieved by changing your diet. Since diarrhea can last up to 7 days, it is easy for you to lose too much fluid from the body and become dehydrated. Fluids that are lost need to be replaced. Along with a modified diet, make sure you drink enough fluids to keep your urine clear or pale yellow.  DIET INSTRUCTIONS  · Ensure adequate fluid intake (hydration): have 1 cup (8 oz) of fluid for each diarrhea episode. Avoid fluids that contain simple sugars or sports drinks, fruit juices, whole milk products, and sodas. Your urine should be clear or pale yellow if you are drinking enough fluids. Hydrate with an oral rehydration solution that you can purchase at pharmacies, retail stores, and online. You can prepare an oral rehydration solution at home by mixing the following ingredients together:  ·   tsp table salt.  · ¾ tsp baking soda.  ·  tsp salt substitute containing potassium chloride.  · 1  tablespoons sugar.  · 1 L (34 oz) of water.  · Certain foods and beverages may increase the speed at which food moves through the gastrointestinal (GI) tract. These foods and beverages should be avoided and include:  · Caffeinated and alcoholic beverages.  · High-fiber foods, such as raw fruits and vegetables, nuts, seeds, and whole grain breads and cereals.  · Foods and beverages sweetened with sugar alcohols, such as xylitol, sorbitol, and mannitol.  · Some foods may be well tolerated and may help thicken stool including:  · Starchy foods, such as rice, toast, pasta, low-sugar cereal, oatmeal, grits, baked potatoes, crackers, and bagels.    · Bananas.    · Applesauce.  · Add probiotic-rich foods to help increase healthy bacteria in the GI tract, such as yogurt and fermented milk products.  RECOMMENDED FOODS AND BEVERAGES  Starches  Choose foods with less than 2 g of fiber per serving.  · Recommended:  White,  French, and pita breads, plain rolls, buns, bagels. Plain muffins, matzo. Soda, saltine, or graham crackers. Pretzels, melba toast, zwieback. Cooked cereals made with water: cornmeal, farina, cream cereals. Dry cereals: refined corn, wheat, rice. Potatoes prepared any way without skins, refined macaroni, spaghetti, noodles, refined rice.  · Avoid:  Bread, rolls, or crackers made with whole wheat, multi-grains, rye, bran seeds, nuts, or coconut. Corn tortillas or taco shells. Cereals containing whole grains, multi-grains, bran, coconut, nuts, raisins. Cooked or dry oatmeal. Coarse wheat cereals, granola. Cereals advertised as "high-fiber." Potato skins. Whole grain pasta, wild or brown rice. Popcorn. Sweet potatoes, yams. Sweet rolls, doughnuts, waffles, pancakes, sweet breads.  Vegetables  · Recommended: Strained tomato and vegetable juices. Most well-cooked and canned vegetables without seeds. Fresh: Tender lettuce, cucumber without the skin, cabbage, spinach, bean sprouts.  · Avoid: Fresh, cooked, or canned: Artichokes, baked beans, beet greens, broccoli, Brussels sprouts, corn, kale, legumes, peas, sweet potatoes. Cooked: Green or red cabbage, spinach. Avoid large servings of any vegetables because vegetables shrink when cooked, and they contain more fiber per serving than fresh vegetables.  Fruit  · Recommended: Cooked or canned: Apricots, applesauce, cantaloupe, cherries, fruit cocktail, grapefruit, grapes, kiwi, mandarin oranges, peaches, pears, plums, watermelon. Fresh: Apples without skin, ripe banana, grapes, cantaloupe, cherries, grapefruit, peaches, oranges, plums. Keep servings limited to ½ cup or 1 piece.  · Avoid: Fresh: Apples with skin, apricots, mangoes, pears, raspberries, strawberries. Prune juice, stewed or dried prunes. Dried   fruits, raisins, dates. Large servings of all fresh fruits.  Protein  · Recommended: Ground or well-cooked tender beef, ham, veal, lamb, pork, or poultry. Eggs. Fish,  oysters, shrimp, lobster, other seafoods. Liver, organ meats.  · Avoid: Tough, fibrous meats with gristle. Peanut butter, smooth or chunky. Cheese, nuts, seeds, legumes, dried peas, beans, lentils.  Dairy  · Recommended: Yogurt, lactose-free milk, kefir, drinkable yogurt, buttermilk, soy milk, or plain hard cheese.  · Avoid: Milk, chocolate milk, beverages made with milk, such as milkshakes.  Soups  · Recommended: Bouillon, broth, or soups made from allowed foods. Any strained soup.  · Avoid: Soups made from vegetables that are not allowed, cream or milk-based soups.  Desserts and Sweets  · Recommended: Sugar-free gelatin, sugar-free frozen ice pops made without sugar alcohol.  · Avoid: Plain cakes and cookies, pie made with fruit, pudding, custard, cream pie. Gelatin, fruit, ice, sherbet, frozen ice pops. Ice cream, ice milk without nuts. Plain hard candy, honey, jelly, molasses, syrup, sugar, chocolate syrup, gumdrops, marshmallows.  Fats and Oils  · Recommended: Limit fats to less than 8 tsp per day.  · Avoid: Seeds, nuts, olives, avocados. Margarine, butter, cream, mayonnaise, salad oils, plain salad dressings. Plain gravy, crisp bacon without rind.  Beverages  · Recommended: Water, decaffeinated teas, oral rehydration solutions, sugar-free beverages not sweetened with sugar alcohols.  · Avoid: Fruit juices, caffeinated beverages (coffee, tea, soda), alcohol, sports drinks, or lemon-lime soda.  Condiments  · Recommended: Ketchup, mustard, horseradish, vinegar, cocoa powder. Spices in moderation: allspice, basil, bay leaves, celery powder or leaves, cinnamon, cumin powder, curry powder, ginger, mace, marjoram, onion or garlic powder, oregano, paprika, parsley flakes, ground pepper, rosemary, sage, savory, tarragon, thyme, turmeric.  · Avoid: Coconut, honey.  Document Released: 10/28/2003 Document Revised: 05/01/2012 Document Reviewed: 12/22/2011  [employer]® Patient Information ©2014 [employer].

## 2013-05-22 NOTE — Telephone Encounter (Signed)
Robin Juarez prescribed Robin Juarez a prescription that is not covered by her insurance and is too expensive. The pharmacist recommended Promethazine. Please call in recommended rx.  Thanks, Revonda Standard

## 2013-05-22 NOTE — Progress Notes (Signed)
Subjective:    Patient ID: Robin Juarez, female    DOB: May 17, 1931, 79 y.o.   MRN: [id number]  HPI  Pt presents to the clinic today with c/o diarrhea. This has been going on since she left the hospital 3 weeks ago for back surgery. She was sent home on Dilaudid. When she stopped taking it, she started having withdrawal symptoms,- agitation, increased HR, jitteriness and diarrhea. She was put on hydrocodone in the place of the dilaudid but she continued to have withdrawal symptoms. After about 3 weeks, her withdrawal symptoms have resolved except for the diarrhea. She is no longer on any pain medication. She continues to have diarrhea. Bright yellow, very smelly stools. She is having 6-8 BM's per day. She is trying to stay well hydrated. She has been taking antidiarrheals but it has not helped. She denies fever, chills, or abdominal pain but she is having nausea. She has lost 9 lbs in the last 3 weeks. She has had recent antibiotics during her surgery.  Review of Systems      Past Medical History  Diagnosis Date  . Hyperlipidemia   . GERD (gastroesophageal reflux disease)   . Osteoporosis   . Arthritis   . Fibromyalgia   . UTI (lower urinary tract infection)   . Urinary incontinence   . Depression 1993, 2000  . Blood transfusion without reported diagnosis     37 yrs ago -s/p back surgery  . OSA (obstructive sleep apnea)     dx 2010-had a cpap-does not use-"uses special mouthpiece now"  . H/O hiatal hernia     Current Outpatient Prescriptions  Medication Sig Dispense Refill  . aspirin 325 MG tablet Take 325 mg by mouth daily.      . cholecalciferol (VITAMIN D) 1000 UNITS tablet Take 1,000 Units by mouth daily.        [name] co-enzyme Q-10 30 MG capsule Take 30 mg by mouth daily.       . diphenhydrAMINE (BENADRYL) 25 MG tablet Take 2 tablets (50 mg total) by mouth every 4 (four) hours as needed for itching.  20 tablet  0  . DULoxetine (CYMBALTA) 60 MG capsule Take 60 mg by mouth  every morning.       . famotidine (PEPCID) 20 MG tablet Take 1 tablet (20 mg total) by mouth 2 (two) times daily.  6 tablet  0  . folic acid (FOLVITE) 800 MCG tablet Take 800 mcg by mouth daily.       [name] gabapentin (NEURONTIN) 300 MG capsule Take 300 mg by mouth 3 (three) times daily.       [name] HYDROmorphone (DILAUDID) 2 MG tablet Take 1 tablet (2 mg total) by mouth every 4 (four) hours as needed for pain.  60 tablet  0  . lamoTRIgine (LAMICTAL) 100 MG tablet Take 100 mg by mouth at bedtime.      . methocarbamol (ROBAXIN) 500 MG tablet Take 1 tablet (500 mg total) by mouth every 6 (six) hours as needed.  40 tablet  1  . OLANZapine (ZYPREXA) 5 MG tablet Take 2.5 mg by mouth at bedtime.       [name] omega-3 acid ethyl esters (LOVAZA) 1 G capsule Take 1 g by mouth daily.      . predniSONE (DELTASONE) 20 MG tablet 2 tabs po daily x 3 days  6 tablet  0   No current facility-administered medications for this visit.    Allergies  Allergen Reactions  . Alendronate Sodium  cramps  . Belladonna Alk-Phenobarbital Other (See Comments)    Unsure-doesn't remember  . Cefaclor     Felt bad  . Cephalexin     Felt bad, but able to tolerate amoxil prev  . Chlordiazepoxide     REACTION: (Maybe)  . Clavulanic Acid     Not an allergy.  Tolerates plain amoxil but not augmentin- diarrhea, etc with augmentin  . Digoxin     Felt weak, tired, "aweful"  . Hydrocodone-Acetaminophen Other (See Comments)    : Bad effect with other meds.  . Oxcarbazepine   . Simvastatin     Myalgia, possible intolerance  . Streptomycin     Lips itched  . Sulfonamide Derivatives     REACTION: (Maybe)-neck pain-brief  . Tetracycline     itching  . Omeprazole Rash    rash    Family History  Problem Relation Age of Onset  . Arthritis Mother   . Kidney disease Maternal Grandmother   . Stroke Father   . Hypertension Father   . Heart disease Brother     Died during CABG  . Hypertension Sister   . Dementia Sister   .  Aneurysm Sister     Brain  . Parkinsonism Sister   . Colon cancer Neg Hx   . Breast cancer Neg Hx     History   Social History  . Marital Status: Married    Spouse Name: N/A    Number of Children: 4  . Years of Education: N/A   Occupational History  . RETIRED    Social History Main Topics  . Smoking status: Never Smoker   . Smokeless tobacco: Never Used  . Alcohol Use: No  . Drug Use: No  . Sexual Activity: Not Currently   Other Topics Concern  . Not on file   Social History Narrative   Married 1953, husband is well.   5 pregnancies, 4 live births, all well (5th was unexpect pregnancy and patient needed to terminate due to concurrent medical problems at the time)   11 grandchildren.  3 great grandchildren   Regular exercise:  No   Caffeine:  Yes, coffee, tea, 2 servings daily.   Diet:  Yes, low fat, low sugar     Constitutional: Denies fever, malaise, fatigue, headache or abrupt weight changes.  Gastrointestinal: Pt reports nausea, diarrhea. Denies abdominal pain, bloating, constipation, or blood in the stool.  GU: Denies urgency, frequency, pain with urination, burning sensation, blood in urine, odor or discharge.   No other specific complaints in a complete review of systems (except as listed in HPI above).  Objective:   Physical Exam  BP 130/84  Pulse 86  Temp(Src) 98.3 F (36.8 C) (Oral)  Wt 172 lb 8 oz (78.245 kg)  BMI 32.61 kg/m2  SpO2 96% Wt Readings from Last 3 Encounters:  05/22/13 172 lb 8 oz (78.245 kg)  05/01/13 181 lb 12 oz (82.441 kg)  05/01/13 181 lb 12 oz (82.441 kg)    General: Appears her stated age, well developed, well nourished in NAD. Cardiovascular: Normal rate and rhythm. S1,S2 noted.  No murmur, rubs or gallops noted. No JVD or BLE edema. No carotid bruits noted. Pulmonary/Chest: Normal effort and positive vesicular breath sounds. No respiratory distress. No wheezes, rales or ronchi noted.  Abdomen: Soft and nontender. Normal  bowel sounds, no bruits noted. No distention or masses noted. Liver, spleen and kidneys non palpable.   BMET    Component Value Date/Time  NA 136 04/29/2013 1045   K 4.8 04/29/2013 1045   CL 98 04/29/2013 1045   CO2 32 04/29/2013 1045   GLUCOSE 100* 04/29/2013 1045   BUN 16 04/29/2013 1045   CREATININE 0.68 04/29/2013 1045   CALCIUM 10.0 04/29/2013 1045   CALCIUM 9.3 05/23/2010 2201   GFRNONAA 81* 04/29/2013 1045   GFRAA >90 04/29/2013 1045    Lipid Panel     Component Value Date/Time   CHOL 258* 03/17/2013 0950   TRIG 88.0 03/17/2013 0950   HDL 61.30 03/17/2013 0950   CHOLHDL 4 03/17/2013 0950   VLDL 17.6 03/17/2013 0950   LDLCALC 114* 09/05/2010 1021    CBC    Component Value Date/Time   WBC 14.5* 04/29/2013 1045   RBC 4.57 04/29/2013 1045   HGB 15.0 04/29/2013 1045   HCT 44.0 04/29/2013 1045   PLT 281 04/29/2013 1045   MCV 96.3 04/29/2013 1045   MCH 32.8 04/29/2013 1045   MCHC 34.1 04/29/2013 1045   RDW 13.4 04/29/2013 1045   LYMPHSABS 2.1 07/08/2012 1425   MONOABS 0.5 07/08/2012 1425   EOSABS 0.2 07/08/2012 1425   BASOSABS 0.1 07/08/2012 1425    Hgb A1C Lab Results  Component Value Date   HGBA1C 5.1 05/27/2009         Assessment & Plan:   Diarrhea and nausea of unknown origin:  Concerning for Cdiff given recent abx use Will check stool culture and Cdiff Hold off on abx until ciff comes back Do not take anymore antidiarrheal medication until the results come back Drink plenty of water/pedialyte to avoid dehydration Diet for diarrhea information given  Will f/u after lab results. If worse over weekend or increase in diarrhea, weakness, dizziness, go to ER immediately

## 2013-05-23 ENCOUNTER — Telehealth: Payer: Self-pay | Admitting: *Deleted

## 2013-05-23 DIAGNOSIS — R197 Diarrhea, unspecified: Secondary | ICD-10-CM | POA: Diagnosis not present

## 2013-05-23 NOTE — Telephone Encounter (Signed)
I talked to [facility] about this yesterday. I wouldn't continue the antidiarrheal meds for now as they hadn't helped prev. I would try to replace as much fluid as possible, ie taking frequent sips of fluids.  As long as still making urine that is light colored, she should be fine.  If she has a sig drop off in urine output or if urine becomes very dark, then I would go to ER.  If I get the results over the weekend I'll let them know.  I think that is the best option in the meantime.

## 2013-05-23 NOTE — Telephone Encounter (Signed)
You can get it with or without abx use.  I would continue as is for now and see what the tests show.

## 2013-05-23 NOTE — Telephone Encounter (Signed)
Patient advised.   She says she is not even sure that she had antibiotics with her surgery and she asked me if someone could get C Diff other than from antibiotics.  I told her that I really wasn't sure but for now, we don't know that it is C diff and to just continue with fluids.  She says she has and has only been eating bland foods.

## 2013-05-23 NOTE — Telephone Encounter (Signed)
Patient was seen yesterday by Nicki Reaper.  Husband phones in today saying that Robin Juarez did not recommend that she take anything for the diarrhea over the weekend and he is concerned about her becoming dehydrated.  She has already had diarrhea x 3-4 days.  They brought in a stool sample this morning to the lab and was told it would likely be sometime next week before getting results.  Again, he is concerned about dehydration in that time.

## 2013-05-24 ENCOUNTER — Encounter (HOSPITAL_COMMUNITY): Payer: Self-pay | Admitting: Emergency Medicine

## 2013-05-24 ENCOUNTER — Emergency Department (HOSPITAL_COMMUNITY): Payer: Medicare Other

## 2013-05-24 ENCOUNTER — Emergency Department (HOSPITAL_COMMUNITY)
Admission: EM | Admit: 2013-05-24 | Discharge: 2013-05-24 | Disposition: A | Discharge status: Home / Self Care | Payer: Medicare Other | Attending: Emergency Medicine | Admitting: Emergency Medicine

## 2013-05-24 DIAGNOSIS — IMO0002 Reserved for concepts with insufficient information to code with codable children: Secondary | ICD-10-CM | POA: Diagnosis not present

## 2013-05-24 DIAGNOSIS — R110 Nausea: Secondary | ICD-10-CM | POA: Diagnosis not present

## 2013-05-24 DIAGNOSIS — R197 Diarrhea, unspecified: Secondary | ICD-10-CM | POA: Insufficient documentation

## 2013-05-24 DIAGNOSIS — F3289 Other specified depressive episodes: Secondary | ICD-10-CM | POA: Diagnosis not present

## 2013-05-24 DIAGNOSIS — Z862 Personal history of diseases of the blood and blood-forming organs and certain disorders involving the immune mechanism: Secondary | ICD-10-CM | POA: Diagnosis not present

## 2013-05-24 DIAGNOSIS — M129 Arthropathy, unspecified: Secondary | ICD-10-CM | POA: Insufficient documentation

## 2013-05-24 DIAGNOSIS — Z87440 Personal history of urinary (tract) infections: Secondary | ICD-10-CM | POA: Diagnosis not present

## 2013-05-24 DIAGNOSIS — Z8639 Personal history of other endocrine, nutritional and metabolic disease: Secondary | ICD-10-CM | POA: Insufficient documentation

## 2013-05-24 DIAGNOSIS — Z8739 Personal history of other diseases of the musculoskeletal system and connective tissue: Secondary | ICD-10-CM | POA: Diagnosis not present

## 2013-05-24 DIAGNOSIS — K219 Gastro-esophageal reflux disease without esophagitis: Secondary | ICD-10-CM | POA: Diagnosis not present

## 2013-05-24 DIAGNOSIS — Z79899 Other long term (current) drug therapy: Secondary | ICD-10-CM | POA: Diagnosis not present

## 2013-05-24 DIAGNOSIS — E860 Dehydration: Secondary | ICD-10-CM | POA: Diagnosis not present

## 2013-05-24 DIAGNOSIS — F329 Major depressive disorder, single episode, unspecified: Secondary | ICD-10-CM | POA: Insufficient documentation

## 2013-05-24 DIAGNOSIS — R112 Nausea with vomiting, unspecified: Secondary | ICD-10-CM | POA: Diagnosis not present

## 2013-05-24 LAB — COMPREHENSIVE METABOLIC PANEL
BUN: 9 mg/dL (ref 6–23)
Calcium: 9.6 mg/dL (ref 8.4–10.5)
Creatinine, Ser: 0.71 mg/dL (ref 0.50–1.10)
GFR calc Af Amer: >90 mL/min (ref >90)
Glucose, Bld: 106 mg/dL — ABNORMAL HIGH (ref 70–99)
Sodium: 139 mEq/L (ref 135–145)
Total Bilirubin: 0.5 mg/dL (ref 0.3–1.2)
Total Protein: 7.2 g/dL (ref 6.0–8.3)

## 2013-05-24 LAB — URINALYSIS W MICROSCOPIC + REFLEX CULTURE
Glucose, UA: NEGATIVE mg/dL
Ketones, ur: NEGATIVE mg/dL
Nitrite: NEGATIVE
Protein, ur: NEGATIVE mg/dL
Urobilinogen, UA: 0.2 mg/dL (ref 0.0–1.0)

## 2013-05-24 LAB — CBC WITH DIFFERENTIAL/PLATELET
Basophils Absolute: 0.1 10*3/uL (ref 0.0–0.1)
Basophils Relative: 1 % (ref 0–1)
Eosinophils Absolute: 0.1 10*3/uL (ref 0.0–0.7)
Eosinophils Relative: 1 % (ref 0–5)
Hemoglobin: 14.2 g/dL (ref 12.0–15.0)
Lymphocytes Relative: 32 % (ref 12–46)
Lymphs Abs: 2.5 10*3/uL (ref 0.7–4.0)
MCH: 32.5 pg (ref 26.0–34.0)
MCHC: 34.1 g/dL (ref 30.0–36.0)
MCV: 95.2 fL (ref 78.0–100.0)
Monocytes Absolute: 0.6 10*3/uL (ref 0.1–1.0)
Monocytes Relative: 8 % (ref 3–12)
Platelets: 263 10*3/uL (ref 150–400)
RBC: 4.37 MIL/uL (ref 3.87–5.11)
WBC: 7.6 10*3/uL (ref 4.0–10.5)

## 2013-05-24 LAB — TROPONIN I: Troponin I: <0.3 ng/mL (ref <0.30)

## 2013-05-24 LAB — CLOSTRIDIUM DIFFICILE EIA: CDIFTX: NEGATIVE

## 2013-05-24 LAB — LIPASE, BLOOD: Lipase: 21 U/L (ref 11–59)

## 2013-05-24 LAB — OCCULT BLOOD, POC DEVICE: Fecal Occult Bld: NEGATIVE

## 2013-05-24 MED ORDER — ONDANSETRON HCL 4 MG/2ML IJ SOLN
4.0000 mg | INTRAMUSCULAR | Status: DC | PRN
  Administered 2013-05-24: 4 mg via INTRAVENOUS
  Filled 2013-05-24: qty 2

## 2013-05-24 NOTE — ED Notes (Signed)
Bed: WA04 Expected date:  Expected time:  Means of arrival:  Comments: 

## 2013-05-24 NOTE — ED Notes (Signed)
Pt stated last bowel movement was this morning at 6:30am

## 2013-05-24 NOTE — ED Notes (Signed)
PT states that she has been having NVD x 2 weeks.  Pt states she had back surgery 3 wks ago and has been sick ever since coming off of dilaudid.  States that she went to her PCP yesterday to see if she has Cdiff but she hasn't gotten the results back.

## 2013-05-24 NOTE — ED Notes (Signed)
No vomiting, just nausea and diarrhea. Loss of appetite, weight loss of 13lb in last 2 weeks, fatigue and weak.

## 2013-05-24 NOTE — ED Provider Notes (Signed)
CSN: [id number]     Arrival date & time 05/24/13  1013 History   First MD Initiated Contact with Patient 05/24/13 1107     Chief Complaint  Patient presents with  . Nausea  . Emesis  . Diarrhea   HPI Pt was seen at 1120. Per pt, c/o gradual onset and persistence of multiple intermittent episodes of diarrhea that began 3 weeks ago. Describes the diarrhea as "loose stools" and "yellow." Pt states she was initially having 6 to 8 stools/day, then 3 to 4 stools per day for the past few days, and has only had 1 stool today. Has been associated with nausea. States she has been able to tolerate PO fluids without increasing nausea or vomiting. States she "just doesn't feel like eating much," though has been able to tolerate PO food. Pt states she was evaluated by her PMD for same, rx phenergan, and had stool sample collected but does not know the results. Pt and family are concerned pt is "getting dehydrated."  Pt states she was "cut off" from taking dilaudid for her chronic back pain 3 weeks ago before her symptoms began. States she "went through the withdrawals" with her "body aching all over," feeling "agitated," and "jittery." States these symptoms have slowly improved. Denies vomiting, no abd pain, no CP/SOB, no back pain, no fevers, no black or blood in stools.     Past Medical History  Diagnosis Date  . Hyperlipidemia   . GERD (gastroesophageal reflux disease)   . Osteoporosis   . Arthritis   . Fibromyalgia   . UTI (lower urinary tract infection)   . Urinary incontinence   . Depression 1993, 2000  . Blood transfusion without reported diagnosis     37 yrs ago -s/p back surgery  . OSA (obstructive sleep apnea)     dx 2010-had a cpap-does not use-"uses special mouthpiece now"  . H/O hiatal hernia    Past Surgical History  Procedure Laterality Date  . Cardiovascular stress test  2009    Normal, Dr. Mayford Knife  . Breast biopsy  1995    x 2  . Abdominal hysterectomy  1976  . Back fusions   1977    missing vertebra  . Childbirth  [phone]  . Ovarian cyst removal  1964  . Appendectomy  1964  . Dilatation and currettage  1964  . Cataract extraction, bilateral Bilateral 2005  . Breast biopsy  07/10/2012    Procedure: BREAST BIOPSY WITH NEEDLE LOCALIZATION;  Surgeon: Emelia Loron, MD;  Location: [facility];  Service: General;  Laterality: Right;  . Cystoscopy N/A 11/25/2012    Procedure: CYSTOSCOPY;  Surgeon: Martina Sinner, MD;  Location: [facility];  Service: Urology;  Laterality: N/A;  . Pubovaginal sling N/A 11/25/2012    Procedure: Leonides Grills;  Surgeon: Martina Sinner, MD;  Location: [facility];  Service: Urology;  Laterality: N/A;  . Back surgery      fusions lumbar area  . Lumbar laminectomy/decompression microdiscectomy N/A 05/01/2013    Procedure: COMPLETE DECOMPRESSIVE LUMBAR LAMINECTOMY L3-L4 MICRODISCECTOMY L3-L4 ON THE LEFT;  Surgeon: Jacki Cones, MD;  Location: [facility];  Service: Orthopedics;  Laterality: N/A;   Family History  Problem Relation Age of Onset  . Arthritis Mother   . Kidney disease Maternal Grandmother   . Stroke Father   . Hypertension Father   . Heart disease Brother     Died during CABG  . Hypertension Sister   .  Dementia Sister   . Aneurysm Sister     Brain  . Parkinsonism Sister   . Colon cancer Neg Hx   . Breast cancer Neg Hx    History  Substance Use Topics  . Smoking status: Never Smoker   . Smokeless tobacco: Never Used  . Alcohol Use: No    Review of Systems ROS: Statement: All systems negative except as marked or noted in the HPI; Constitutional: Negative for fever and chills. ; ; Eyes: Negative for eye pain, redness and discharge. ; ; ENMT: Negative for ear pain, hoarseness, nasal congestion, sinus pressure and sore throat. ; ; Cardiovascular: Negative for chest pain, palpitations, diaphoresis, dyspnea and peripheral edema. ; ; Respiratory: Negative  for cough, wheezing and stridor. ; ; Gastrointestinal: +nausea, diarrhea. Negative for vomiting, abdominal pain, blood in stool, hematemesis, jaundice and rectal bleeding. . ; ; Genitourinary: Negative for dysuria, flank pain and hematuria. ; ; Musculoskeletal: Negative for back pain and neck pain. Negative for swelling and trauma.; ; Skin: Negative for pruritus, rash, abrasions, blisters, bruising and skin lesion.; ; Neuro: Negative for headache, lightheadedness and neck stiffness. Negative for weakness, altered level of consciousness , altered mental status, extremity weakness, paresthesias, involuntary movement, seizure and syncope.       Allergies  Alendronate sodium; Belladonna alk-phenobarbital; Cefaclor; Cephalexin; Chlordiazepoxide; Clavulanic acid; Digoxin; Hydrocodone-acetaminophen; Oxcarbazepine; Simvastatin; Streptomycin; Sulfonamide derivatives; Tetracycline; and Omeprazole  Home Medications   Current Outpatient Rx  Name  Route  Sig  Dispense  Refill  . ALPRAZolam (XANAX) 0.5 MG tablet   Oral   Take 0.5 mg by mouth at bedtime as needed for sleep.         [name] aspirin 325 MG tablet   Oral   Take 325 mg by mouth daily.         . cholecalciferol (VITAMIN D) 1000 UNITS tablet   Oral   Take 1,000 Units by mouth daily.           [name] co-enzyme Q-10 30 MG capsule   Oral   Take 30 mg by mouth daily.          . DULoxetine (CYMBALTA) 60 MG capsule   Oral   Take 90 mg by mouth every morning.          . famotidine (PEPCID) 20 MG tablet   Oral   Take 1 tablet (20 mg total) by mouth 2 (two) times daily.   6 tablet   0   . folic acid (FOLVITE) 800 MCG tablet   Oral   Take 800 mcg by mouth daily.          [name] gabapentin (NEURONTIN) 300 MG capsule   Oral   Take 300 mg by mouth 3 (three) times daily.          [name] HYDROcodone-acetaminophen (NORCO/VICODIN) 5-325 MG per tablet   Oral   Take 1 tablet by mouth every 6 (six) hours as needed for pain.         [name] lamoTRIgine  (LAMICTAL) 100 MG tablet   Oral   Take 100 mg by mouth at bedtime.         [name] OLANZapine (ZYPREXA) 2.5 MG tablet   Oral   Take 2.5 mg by mouth at bedtime.         . Probiotic Product (FLORAJEN3 PO)   Oral   Take 1 capsule by mouth daily.         . promethazine (PHENERGAN) 25 MG tablet  Oral   Take 0.5-1 tablets (12.5-25 mg total) by mouth every 8 (eight) hours as needed for nausea (sedation caution).   20 tablet   0   . diphenhydrAMINE (BENADRYL) 25 MG tablet   Oral   Take 2 tablets (50 mg total) by mouth every 4 (four) hours as needed for itching.   20 tablet   0   . predniSONE (DELTASONE) 20 MG tablet      2 tabs po daily x 3 days   6 tablet   0    BP 146/75  Pulse 85  Temp(Src) 98.9 F (37.2 C) (Oral)  Resp 18  SpO2 100% Physical Exam 1125: Physical examination:  Nursing notes reviewed; Vital signs and O2 SAT reviewed;  Constitutional: Well developed, Well nourished, Well hydrated, In no acute distress; Head:  Normocephalic, atraumatic; Eyes: EOMI, PERRL, No scleral icterus; ENMT: Mouth and pharynx normal, Mucous membranes moist; Neck: Supple, Full range of motion, No lymphadenopathy; Cardiovascular: Regular rate and rhythm, No gallop; Respiratory: Breath sounds clear & equal bilaterally, No rales, rhonchi, wheezes.  Speaking full sentences with ease, Normal respiratory effort/excursion; Chest: Nontender, Movement normal; Abdomen: Soft, Nontender, Nondistended, Normal bowel sounds. Rectal exam performed w/permission of pt and ED RN chaperone present.  Anal tone normal.  Non-tender, soft brown stool in rectal vault, heme neg.  No fissures, no external hemorrhoids, no palp masses.;;; Genitourinary: No CVA tenderness; Extremities: Pulses normal, No tenderness, No edema, No calf edema or asymmetry.; Neuro: AA&Ox3, Major CN grossly intact.  Speech clear. No gross focal motor or sensory deficits in extremities.; Skin: Color normal, Warm, Dry.   ED Course  Procedures      MDM  MDM Reviewed: previous chart, nursing note and vitals Reviewed previous: labs and ECG Interpretation: labs, ECG and x-ray    Date: 05/24/2013  Rate: 78  Rhythm: normal sinus rhythm  QRS Axis: normal  Intervals: normal  ST/T Wave abnormalities: normal  Conduction Disutrbances:none  Narrative Interpretation:   Old EKG Reviewed: unchanged; no significant changes from previous EKG dated 04/28/2013.  Results for orders placed during the hospital encounter of 05/24/13  CBC WITH DIFFERENTIAL      Result Value Range   WBC 7.6  4.0 - 10.5 K/uL   RBC 4.37  3.87 - 5.11 MIL/uL   Hemoglobin 14.2  12.0 - 15.0 g/dL   HCT 40.9  81.1 - 91.4 %   MCV 95.2  78.0 - 100.0 fL   MCH 32.5  26.0 - 34.0 pg   MCHC 34.1  30.0 - 36.0 g/dL   RDW 78.2  95.6 - 21.3 %   Platelets 263  150 - 400 K/uL   Neutrophils Relative % 58  43 - 77 %   Neutro Abs 4.4  1.7 - 7.7 K/uL   Lymphocytes Relative 32  12 - 46 %   Lymphs Abs 2.5  0.7 - 4.0 K/uL   Monocytes Relative 8  3 - 12 %   Monocytes Absolute 0.6  0.1 - 1.0 K/uL   Eosinophils Relative 1  0 - 5 %   Eosinophils Absolute 0.1  0.0 - 0.7 K/uL   Basophils Relative 1  0 - 1 %   Basophils Absolute 0.1  0.0 - 0.1 K/uL  COMPREHENSIVE METABOLIC PANEL      Result Value Range   Sodium 139  135 - 145 mEq/L   Potassium 3.8  3.5 - 5.1 mEq/L   Chloride 102  96 - 112 mEq/L   CO2 27  19 - 32 mEq/L   Glucose, Bld 106 (*) 70 - 99 mg/dL   BUN 9  6 - 23 mg/dL   Creatinine, Ser 1.61  0.50 - 1.10 mg/dL   Calcium 9.6  8.4 - 09.6 mg/dL   Total Protein 7.2  6.0 - 8.3 g/dL   Albumin 3.8  3.5 - 5.2 g/dL   AST 20  0 - 37 U/L   ALT 19  0 - 35 U/L   Alkaline Phosphatase 86  39 - 117 U/L   Total Bilirubin 0.5  0.3 - 1.2 mg/dL   GFR calc non Af Amer 80 (*) >90 mL/min   GFR calc Af Amer >90  >90 mL/min  LIPASE, BLOOD      Result Value Range   Lipase 21  11 - 59 U/L  TROPONIN I      Result Value Range   Troponin I <0.30  <0.30 ng/mL  URINALYSIS W MICROSCOPIC +  REFLEX CULTURE      Result Value Range   Color, Urine YELLOW  YELLOW   APPearance CLEAR  CLEAR   Specific Gravity, Urine 1.014  1.005 - 1.030   pH 7.5  5.0 - 8.0   Glucose, UA NEGATIVE  NEGATIVE mg/dL   Hgb urine dipstick NEGATIVE  NEGATIVE   Bilirubin Urine NEGATIVE  NEGATIVE   Ketones, ur NEGATIVE  NEGATIVE mg/dL   Protein, ur NEGATIVE  NEGATIVE mg/dL   Urobilinogen, UA 0.2  0.0 - 1.0 mg/dL   Nitrite NEGATIVE  NEGATIVE   Leukocytes, UA TRACE (*) NEGATIVE   WBC, UA 0-2  <3 WBC/hpf   Squamous Epithelial / LPF RARE  RARE  OCCULT BLOOD, POC DEVICE      Result Value Range   Fecal Occult Bld NEGATIVE  NEGATIVE    Dg Abd Acute W/chest 05/24/2013   CLINICAL DATA:  Nausea, vomiting, and diarrhea. Recent lumbar surgery.  EXAM: ACUTE ABDOMEN SERIES (ABDOMEN 2 VIEW & CHEST 1 VIEW)  COMPARISON:  Lumbar films 04/17/2013  FINDINGS: There is no evidence of dilated bowel loops or free intraperitoneal air. No radiopaque calculi or other significant radiographic abnormality is seen. Heart size and mediastinal contours are within normal limits. Both lungs are clear.  IMPRESSION: Negative abdominal radiographs.  No acute cardiopulmonary disease.   Electronically Signed   By: Genevive Bi M.D.   On: 05/24/2013 11:57    1500:  Pt has tol PO well while in the ED without N/V.  No stooling while in the ED.  Abd remains benign, VSS. Pt is not orthostatic. States she feels better and wants to go home now. EPIC chart reviewed: 05/23/13 cdiff results are negative.  Pt and family informed of this reassuring result. Dx and testing d/w pt and family.  Questions answered.  Verb understanding, agreeable to d/c home with outpt f/u.         Laray Anger, DO 05/27/13 1149

## 2013-05-24 NOTE — ED Notes (Signed)
Pt has given urine sample if needed. 

## 2013-05-30 ENCOUNTER — Ambulatory Visit: Payer: Medicare Other | Admitting: Family Medicine

## 2013-06-03 ENCOUNTER — Ambulatory Visit: Payer: Medicare Other | Admitting: Family Medicine

## 2013-06-05 ENCOUNTER — Ambulatory Visit: Payer: Medicare Other | Admitting: Family Medicine

## 2013-06-10 ENCOUNTER — Ambulatory Visit (INDEPENDENT_AMBULATORY_CARE_PROVIDER_SITE_OTHER): Payer: Medicare Other

## 2013-06-10 DIAGNOSIS — Z23 Encounter for immunization: Secondary | ICD-10-CM | POA: Diagnosis not present

## 2013-07-23 ENCOUNTER — Encounter: Payer: Self-pay | Admitting: Pulmonary Disease

## 2013-07-23 ENCOUNTER — Ambulatory Visit (INDEPENDENT_AMBULATORY_CARE_PROVIDER_SITE_OTHER): Payer: Medicare Other | Admitting: Pulmonary Disease

## 2013-07-23 VITALS — BP 140/86 | HR 80 | Ht 61.0 in | Wt 178.0 lb

## 2013-07-23 DIAGNOSIS — G4733 Obstructive sleep apnea (adult) (pediatric): Secondary | ICD-10-CM | POA: Diagnosis not present

## 2013-07-23 NOTE — Assessment & Plan Note (Signed)
She has been fitted with oral appliance by Dr. Althea Grimmer.  She is doing well with this.  Will arrange for home sleep study with her using oral appliance.

## 2013-07-23 NOTE — Patient Instructions (Signed)
Will arrange for home sleep study while using oral appliance for sleep apnea >> will call with results Follow up in 1 year

## 2013-07-23 NOTE — Progress Notes (Signed)
Chief Complaint  Patient presents with  . Sleep Apnea    Currently using oral appliance to treat OSA. States that this is working well for her.    History of Present Illness: Robin Juarez is a 79 y.o. female with OSA using oral appliance.  She goes to bed at 10 pm.  She falls asleep quickly, and sleeps through the night.  She wakes up at 7 am.  She feels rested during the day.  She uses her oral appliance every night.  Her mouth is a little stiff in the morning when she takes out her device, but otherwise she is blessed with how she is doing.  Tests: PSG 10/27/08 >> AHI 15.4 CPAP titration 12/08/08 >> CPAP 7 cm H2O PSG 09/04/12 >> AHI 11, SpO2 low 86%, REM AHI 42.2, PLMI 18.7.  She  has a past medical history of Hyperlipidemia; GERD (gastroesophageal reflux disease); Osteoporosis; Arthritis; Fibromyalgia; UTI (lower urinary tract infection); Urinary incontinence; Depression (1993, 2000); Blood transfusion without reported diagnosis; OSA (obstructive sleep apnea); and H/O hiatal hernia.  She  has past surgical history that includes Cardiovascular stress test (2009); Breast biopsy (1995); Abdominal hysterectomy (1976); Back fusions (1977); Childbirth [phone]); Ovarian cyst removal (1964); Appendectomy (1964); dilatation and currettage (1964); Cataract extraction, bilateral (Bilateral, 2005); Breast biopsy (07/10/2012); Cystoscopy (N/A, 11/25/2012); Pubovaginal sling (N/A, 11/25/2012); Back surgery; and Lumbar laminectomy/decompression microdiscectomy (N/A, 05/01/2013).  Current Outpatient Prescriptions on File Prior to Visit  Medication Sig Dispense Refill  . DULoxetine (CYMBALTA) 60 MG capsule Take 90 mg by mouth every morning.       . folic acid (FOLVITE) 800 MCG tablet Take 800 mcg by mouth daily.       [name] gabapentin (NEURONTIN) 300 MG capsule Take 300 mg by mouth 3 (three) times daily.       [name] lamoTRIgine (LAMICTAL) 100 MG tablet Take 100 mg by mouth at bedtime.      [name]  OLANZapine (ZYPREXA) 2.5 MG tablet Take 2.5 mg by mouth at bedtime.      . cholecalciferol (VITAMIN D) 1000 UNITS tablet Take 1,000 Units by mouth daily.         No current facility-administered medications on file prior to visit.    Allergies  Allergen Reactions  . Alendronate Sodium     cramps  . Cefaclor     Felt bad  . Cephalexin     Felt bad, but able to tolerate amoxil prev  . Chlordiazepoxide     REACTION: (Maybe)  . Clavulanic Acid     Not an allergy.  Tolerates plain amoxil but not augmentin- diarrhea, etc with augmentin  . Digoxin     Felt weak, tired, "aweful"  . Hydrocodone-Acetaminophen Other (See Comments)    : Bad effect with other meds.  . Oxcarbazepine     unknown  . Pb-Hyoscy-Atropine-Scopolamine Other (See Comments)    Unsure-doesn't remember  . Simvastatin     Myalgia, possible intolerance  . Streptomycin     Lips itched  . Sulfonamide Derivatives     REACTION: (Maybe)-neck pain-brief  . Tetracycline     itching  . Omeprazole Rash    rash      Physical Exam:  General - No distress ENT - No sinus tenderness, no oral exudate, MP 3, enlarged tongue with scalloped border, +2 tonsil, no LAN Cardiac - s1s2 regular, no murmur Chest - No wheeze/rales/dullness Back - No focal tenderness Abd - Soft, non-tender Ext - No edema Neuro - Normal strength  Skin - No rashes Psych - Normal mood, and behavior   Assessment/Plan:  Robin Helling, MD [facility] Pager:  [phone] 07/23/2013, 4:32 PM

## 2013-08-18 DIAGNOSIS — M25519 Pain in unspecified shoulder: Secondary | ICD-10-CM | POA: Diagnosis not present

## 2013-09-01 ENCOUNTER — Other Ambulatory Visit: Payer: Self-pay

## 2013-09-01 DIAGNOSIS — Z1231 Encounter for screening mammogram for malignant neoplasm of breast: Secondary | ICD-10-CM

## 2013-09-03 ENCOUNTER — Encounter: Payer: Self-pay | Admitting: Family Medicine

## 2013-09-03 ENCOUNTER — Ambulatory Visit (INDEPENDENT_AMBULATORY_CARE_PROVIDER_SITE_OTHER): Payer: Medicare Other | Admitting: Family Medicine

## 2013-09-03 VITALS — BP 126/84 | HR 80 | Temp 97.8°F | Ht 61.0 in | Wt 180.8 lb

## 2013-09-03 DIAGNOSIS — J069 Acute upper respiratory infection, unspecified: Secondary | ICD-10-CM

## 2013-09-03 NOTE — Progress Notes (Signed)
Pre-visit discussion using our clinic review tool. No additional management support is needed unless otherwise documented below in the visit note.  

## 2013-09-03 NOTE — Progress Notes (Signed)
   Date:  09/03/2013   Name:  Robin FieldMartha J Juarez   DOB:  September 10, 1930   MRN:  [id number] Gender: female Age: 80 y.o.  Primary Physician:  Crawford GivensGraham Duncan, MD   Chief Complaint: Cough   Subjective:   History of Present Illness:  Robin Juarez is a 80 y.o. very pleasant female patient who presents with the following:  The patient has had a URI with some congestion and cough for about the last 10 days, she is currently afebrile, and she still has a little bit of minimal cough in the morning. There is nothing that is significantly productive and no blood. Her cough is relatively mild, she has taken a few tablets of the mucinex  Past Medical History, Surgical History, Social History, Family History, Problem List, Medications, and Allergies have been reviewed and updated if relevant.  Review of Systems: ROS: GEN: Acute illness details above GI: Tolerating PO intake GU: maintaining adequate hydration and urination Pulm: No SOB Interactive and getting along well at home.  Otherwise, ROS is as per the HPI.   Objective:   Physical Examination: BP 126/84  Pulse 80  Temp(Src) 97.8 F (36.6 C) (Oral)  Ht 5\' 1"  (1.549 m)  Wt 180 lb 12 oz (81.988 kg)  BMI 34.17 kg/m2   GEN: A and O x 3. WDWN. NAD.    ENT: Nose clear, ext NML.  No LAD.  No JVD.  TM's clear. Oropharynx clear.  PULM: Normal WOB, no distress. No crackles, wheezes, rhonchi. CV: RRR, no M/G/R, No rubs, No JVD.   EXT: warm and well-perfused, No c/c/e. PSYCH: Pleasant and conversant.    Laboratory and Imaging Data:  Assessment & Plan:    URI (upper respiratory infection)  Mostly post-inf cough, reassurance  Signed,  Journee Kohen T. Eurika Sandy, MD, CAQ Sports Medicine  [facility] [address] [place] [address] Phone: [phone] Fax: [phone]    Medication List       This list is accurate as of: 09/03/13  2:44 PM.  Always use your most recent med list.               aspirin 81 MG tablet  Take 81 mg by mouth daily.     cholecalciferol 1000 UNITS tablet  Commonly known as:  VITAMIN D  Take 1,000 Units by mouth daily.     DULoxetine 60 MG capsule  Commonly known as:  CYMBALTA  Take 60 mg by mouth every morning.     folic acid 800 MCG tablet  Commonly known as:  FOLVITE  Take 800 mcg by mouth daily.     gabapentin 300 MG capsule  Commonly known as:  NEURONTIN  Take 300 mg by mouth 3 (three) times daily.     lamoTRIgine 100 MG tablet  Commonly known as:  LAMICTAL  Take 100 mg by mouth at bedtime.     OLANZapine 2.5 MG tablet  Commonly known as:  ZYPREXA  Take 2.5 mg by mouth at bedtime.

## 2013-09-12 ENCOUNTER — Ambulatory Visit (INDEPENDENT_AMBULATORY_CARE_PROVIDER_SITE_OTHER): Payer: Medicare Other | Admitting: Family Medicine

## 2013-09-12 ENCOUNTER — Encounter: Payer: Self-pay | Admitting: Family Medicine

## 2013-09-12 VITALS — BP 124/78 | HR 85 | Temp 98.1°F | Wt 179.0 lb

## 2013-09-12 DIAGNOSIS — E7800 Pure hypercholesterolemia, unspecified: Secondary | ICD-10-CM

## 2013-09-12 DIAGNOSIS — H0289 Other specified disorders of eyelid: Secondary | ICD-10-CM

## 2013-09-12 DIAGNOSIS — E785 Hyperlipidemia, unspecified: Secondary | ICD-10-CM

## 2013-09-12 MED ORDER — EZETIMIBE 10 MG PO TABS: 10.0000 mg | ORAL_TABLET | Freq: Every day | ORAL | Status: DC

## 2013-09-12 NOTE — Patient Instructions (Signed)
Try the zetia for now and recheck cholesterol in about 1 month at a lab visit.  Take care.   Glad to see you.

## 2013-09-12 NOTE — Progress Notes (Signed)
Pre-visit discussion using our clinic review tool. No additional management support is needed unless otherwise documented below in the visit note.  Asking about getting on meds for cholesterol.  She had a presumed statin intolerance.  She still has aches and saw ortho about her shoulder pain, per patient dx'd with tendonitis, treated with prednisone course.  Asking about zetia.    B upper eyelid irritation.  Itching.  Asking about options.  No new make up but did use some new eyelid cleaner recently.  No eye or lower lid sx.    Meds, vitals, and allergies reviewed.   ROS: See HPI.  Otherwise, noncontributory.  Nad ncat Minimal irritation on the medial upper eyelids.   OP wnl Neck supple rrr ctab

## 2013-09-14 DIAGNOSIS — H0289 Other specified disorders of eyelid: Secondary | ICD-10-CM | POA: Insufficient documentation

## 2013-09-14 NOTE — Assessment & Plan Note (Signed)
Reasonable to try zetia.  She'll notify me if any ADEs.  She agrees.  Return for labs.

## 2013-09-14 NOTE — Assessment & Plan Note (Signed)
Would stop using the new eyelid cleaner and f/u with eye clinic if sx persist.  She agrees.

## 2013-09-15 ENCOUNTER — Ambulatory Visit
Admission: RE | Admit: 2013-09-15 | Discharge: 2013-09-15 | Disposition: A | Discharge status: Home / Self Care | Payer: Medicare Other | Source: Ambulatory Visit

## 2013-09-15 DIAGNOSIS — Z1231 Encounter for screening mammogram for malignant neoplasm of breast: Secondary | ICD-10-CM

## 2013-09-22 ENCOUNTER — Encounter: Payer: Self-pay | Admitting: *Deleted

## 2013-10-10 DIAGNOSIS — L293 Anogenital pruritus, unspecified: Secondary | ICD-10-CM | POA: Diagnosis not present

## 2013-10-10 DIAGNOSIS — L290 Pruritus ani: Secondary | ICD-10-CM | POA: Diagnosis not present

## 2013-10-28 ENCOUNTER — Encounter: Payer: Self-pay | Admitting: Family Medicine

## 2013-10-28 ENCOUNTER — Ambulatory Visit (INDEPENDENT_AMBULATORY_CARE_PROVIDER_SITE_OTHER): Payer: Medicare Other | Admitting: Family Medicine

## 2013-10-28 VITALS — BP 128/80 | HR 82 | Temp 98.3°F | Wt 179.5 lb

## 2013-10-28 DIAGNOSIS — J069 Acute upper respiratory infection, unspecified: Secondary | ICD-10-CM | POA: Diagnosis not present

## 2013-10-28 MED ORDER — AMOXICILLIN 875 MG PO TABS: 875.0000 mg | ORAL_TABLET | Freq: Two times a day (BID) | ORAL | Status: AC

## 2013-10-28 NOTE — Patient Instructions (Signed)
Use the OTC cold meds for now. If the facial pain continues, then use the amoxil. Take care.  Try to get some rest and drink plenty of fluids.  Glad to see you.

## 2013-10-28 NOTE — Progress Notes (Signed)
Pre visit review using our clinic review tool, if applicable. No additional management support is needed unless otherwise documented below in the visit note.  Recently with head and chest congestion, for about 1 week.  Some sputum, some rhinorrhea. No fevers.  No vomiting.  No rash.  No ear pain. Some ST initially, better now.  Taking otc cold meds for a few days.  Still with post nasal gtt.  Fatigued.  Overall about the same as she has been the last few days, not worse.   Meds, vitals, and allergies reviewed.   ROS: See HPI.  Otherwise, noncontributory.  GEN: nad, alert and oriented HEENT: mucous membranes moist, tm w/o erythema, nasal exam w/o erythema, clear discharge noted,  OP with cobblestoning, max sinuses mildly ttp B NECK: supple w/o LA CV: rrr.   PULM: ctab, no inc wob EXT: no edema SKIN: no acute rash

## 2013-10-29 DIAGNOSIS — J069 Acute upper respiratory infection, unspecified: Secondary | ICD-10-CM | POA: Insufficient documentation

## 2013-10-29 NOTE — Assessment & Plan Note (Signed)
Possible developing sinusitis.  Nontoxic.  I would give this a few more days, supportive care in meantime.  If not improved, then start amoxil. She agrees.  F/u prn.

## 2013-10-31 IMAGING — CR DG CHEST 2V
2 series · 2 of 2 positions shown · non-contrast
Comparison: None.

CLINICAL DATA: Congestion, pain.  Rule out infection.

CHEST - 2 VIEW

[w chest pa]
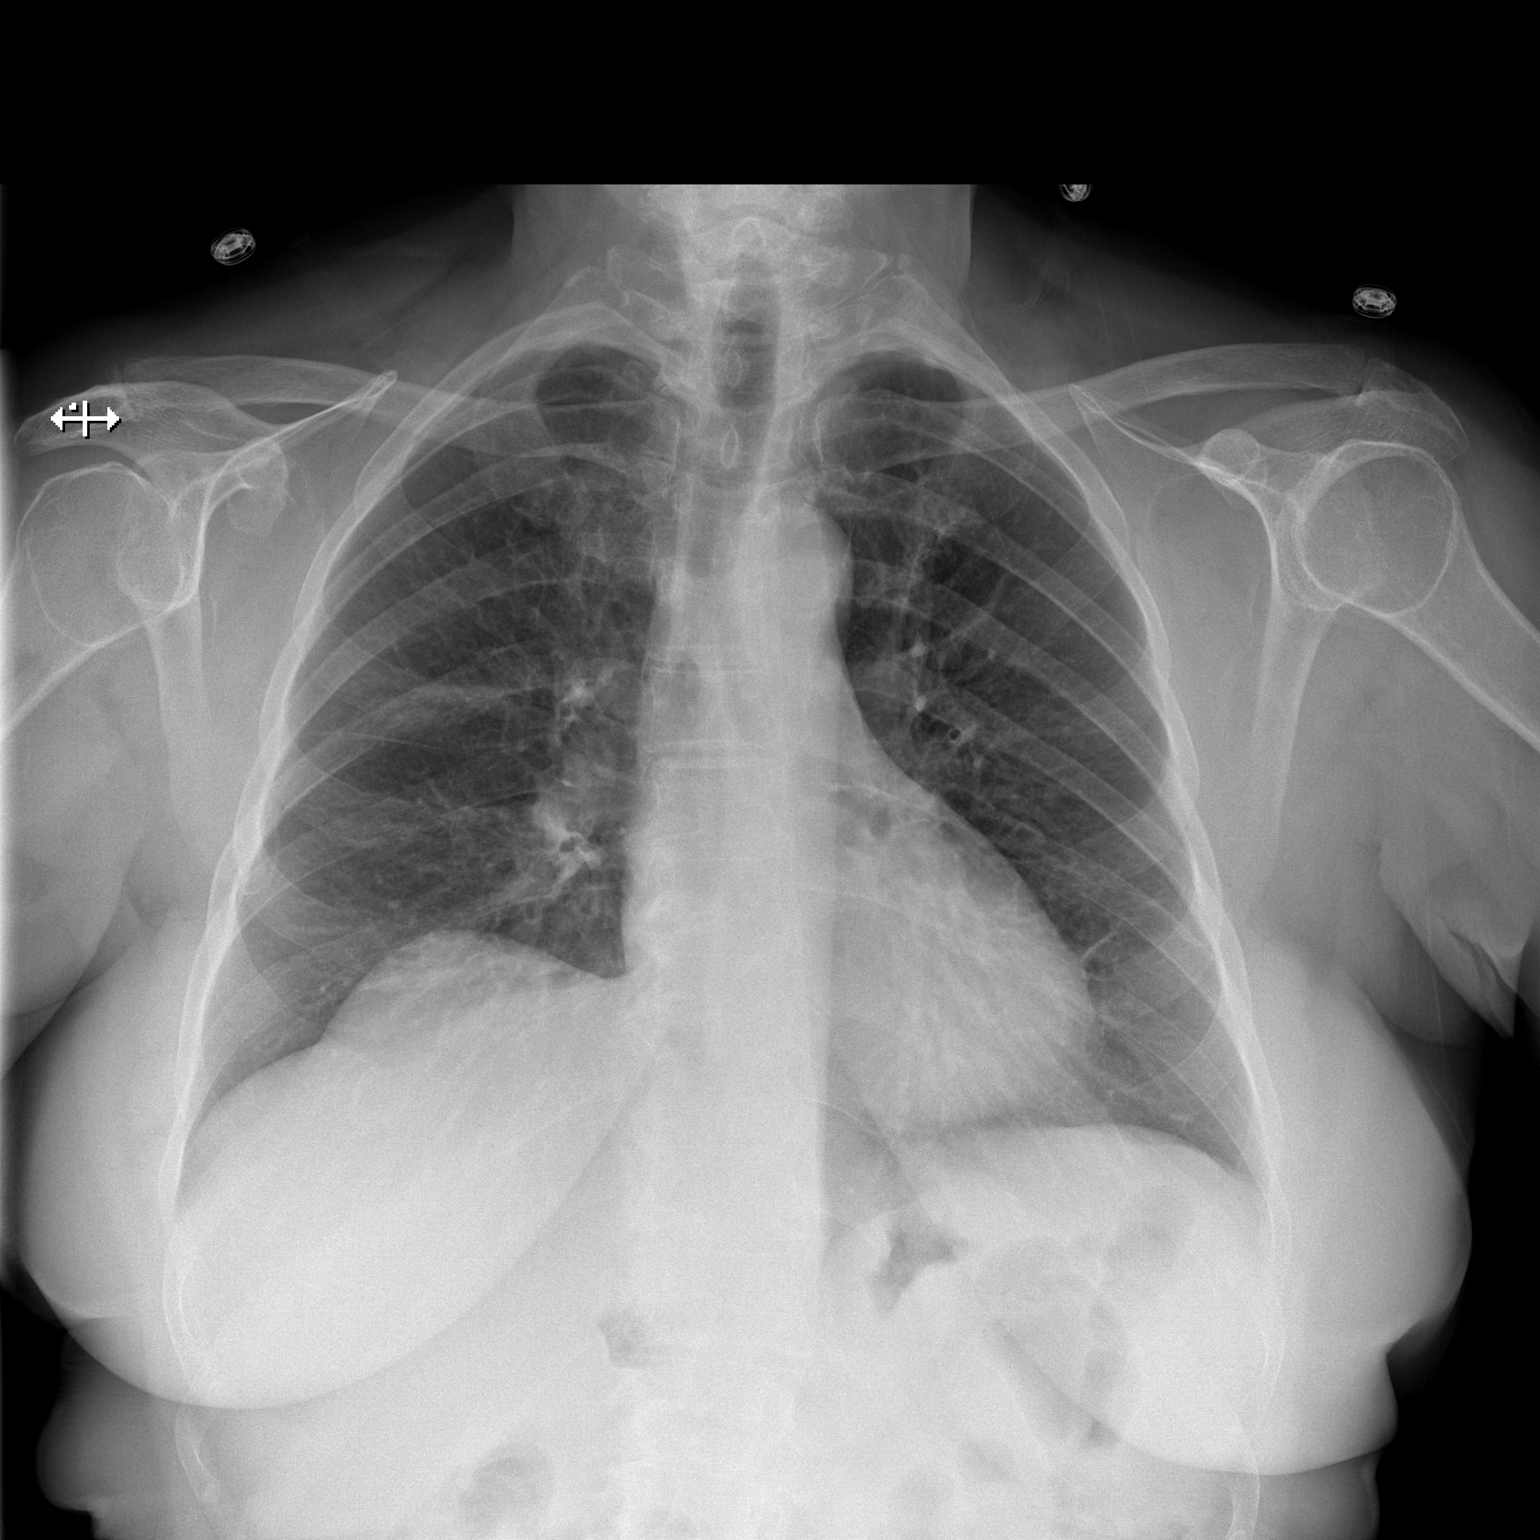

[w chest lat]
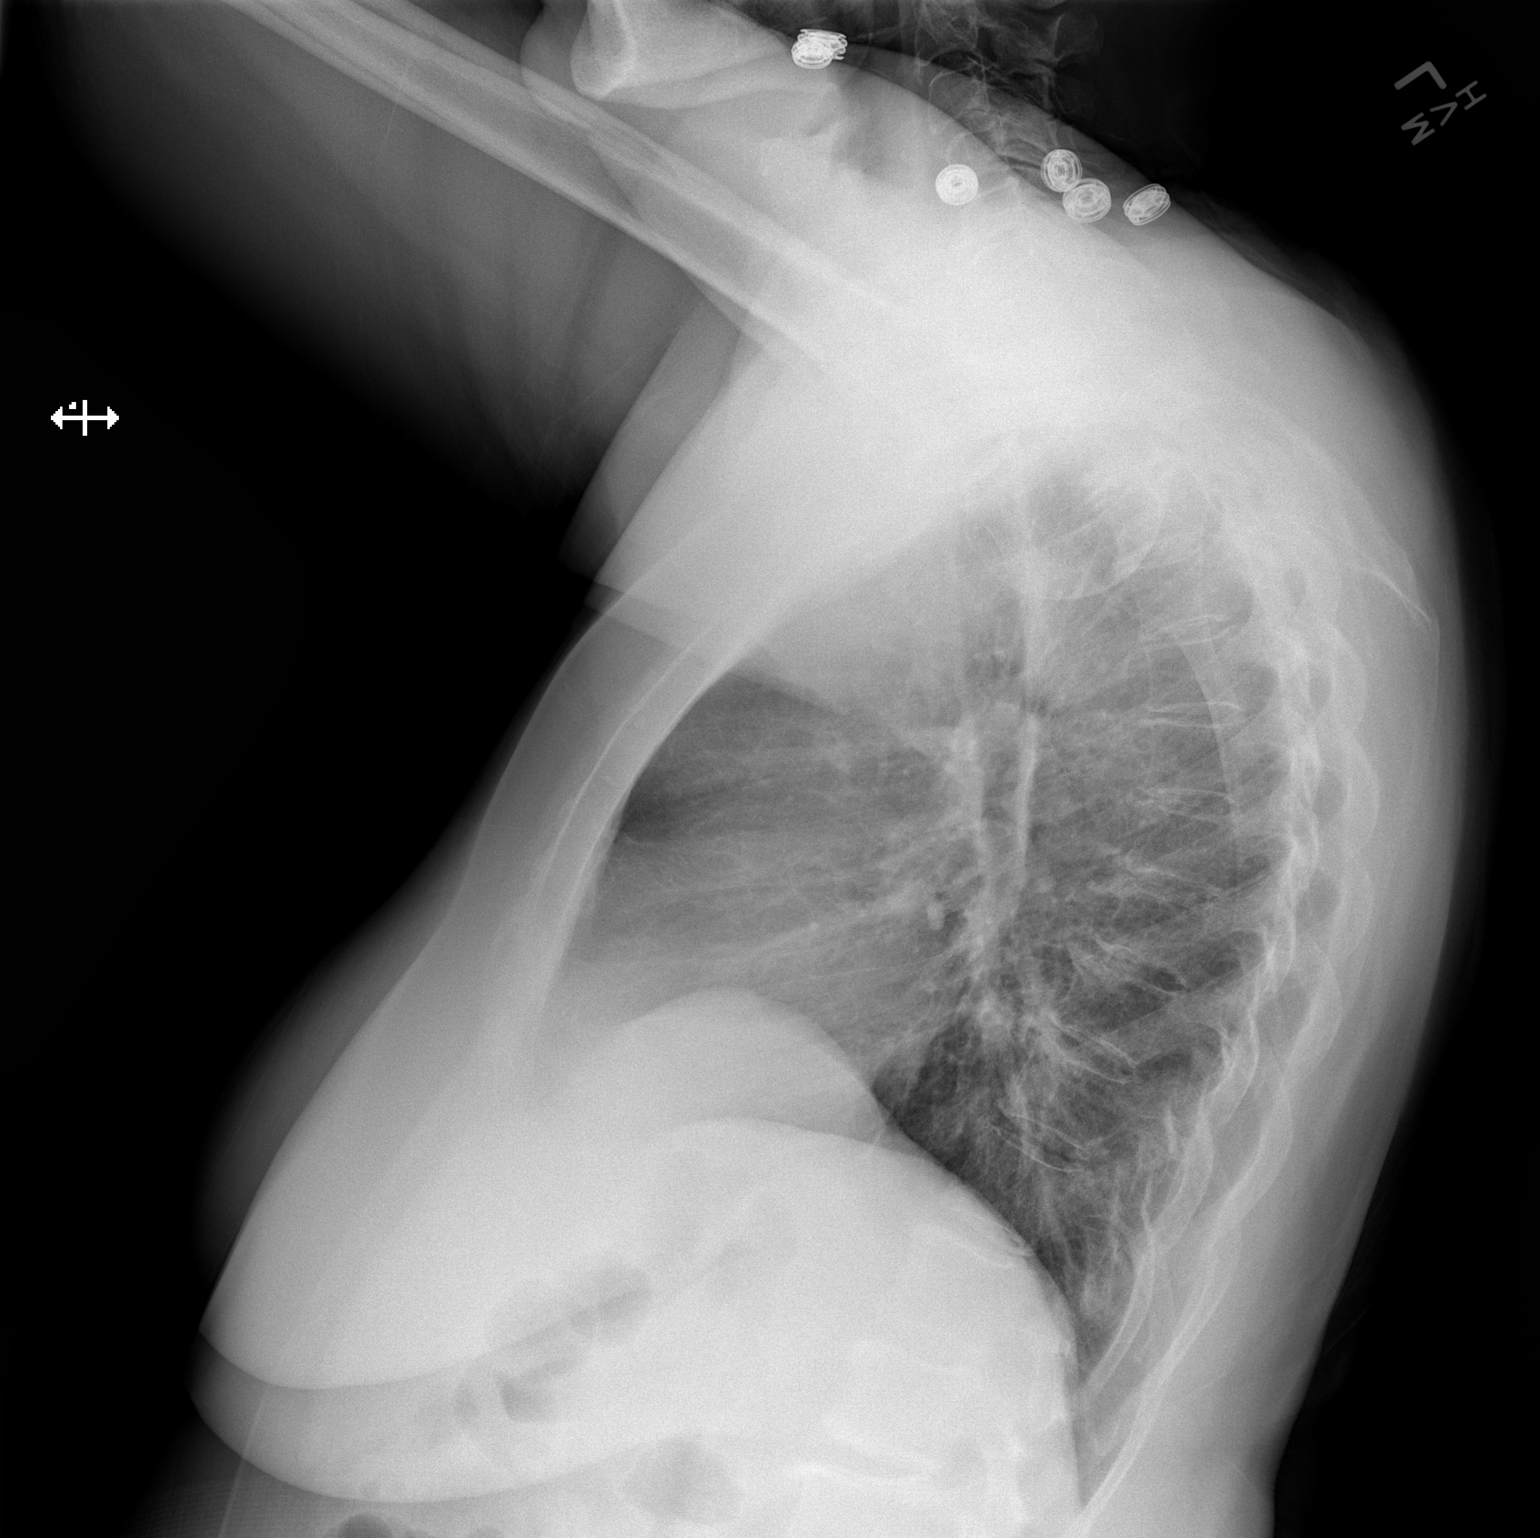

[2 of 2 positions shown; findings below may reference images not displayed]

FINDINGS: Heart size is normal.  There is mild perihilar bronchitic
change.  Mild elevation of the right hemidiaphragm.  No focal
consolidations, pleural effusions, or pulmonary edema.  There is
mild mid thoracic degenerative change.
IMPRESSION: 1.  Mild bronchitic change.
2. No focal pulmonary abnormality.

## 2013-11-16 DIAGNOSIS — N3000 Acute cystitis without hematuria: Secondary | ICD-10-CM | POA: Diagnosis not present

## 2013-11-20 DIAGNOSIS — F3289 Other specified depressive episodes: Secondary | ICD-10-CM | POA: Diagnosis not present

## 2013-11-20 DIAGNOSIS — F329 Major depressive disorder, single episode, unspecified: Secondary | ICD-10-CM | POA: Diagnosis not present

## 2013-11-20 DIAGNOSIS — F068 Other specified mental disorders due to known physiological condition: Secondary | ICD-10-CM | POA: Diagnosis not present

## 2014-01-06 DIAGNOSIS — F3289 Other specified depressive episodes: Secondary | ICD-10-CM | POA: Diagnosis not present

## 2014-01-06 DIAGNOSIS — F068 Other specified mental disorders due to known physiological condition: Secondary | ICD-10-CM | POA: Diagnosis not present

## 2014-01-06 DIAGNOSIS — F329 Major depressive disorder, single episode, unspecified: Secondary | ICD-10-CM | POA: Diagnosis not present

## 2014-01-27 DIAGNOSIS — F068 Other specified mental disorders due to known physiological condition: Secondary | ICD-10-CM | POA: Diagnosis not present

## 2014-01-27 DIAGNOSIS — F329 Major depressive disorder, single episode, unspecified: Secondary | ICD-10-CM | POA: Diagnosis not present

## 2014-01-27 DIAGNOSIS — F3289 Other specified depressive episodes: Secondary | ICD-10-CM | POA: Diagnosis not present

## 2014-01-29 ENCOUNTER — Ambulatory Visit (INDEPENDENT_AMBULATORY_CARE_PROVIDER_SITE_OTHER): Payer: Medicare Other | Admitting: Family Medicine

## 2014-01-29 ENCOUNTER — Encounter: Payer: Self-pay | Admitting: Family Medicine

## 2014-01-29 VITALS — BP 136/88 | HR 69 | Temp 97.7°F | Wt 180.5 lb

## 2014-01-29 DIAGNOSIS — R300 Dysuria: Secondary | ICD-10-CM | POA: Diagnosis not present

## 2014-01-29 DIAGNOSIS — Z87448 Personal history of other diseases of urinary system: Secondary | ICD-10-CM | POA: Diagnosis not present

## 2014-01-29 LAB — POCT URINALYSIS DIPSTICK
BILIRUBIN UA: NEGATIVE
Blood, UA: NEGATIVE
GLUCOSE UA: NEGATIVE
Ketones, UA: NEGATIVE
NITRITE UA: NEGATIVE
PH UA: 6
Spec Grav, UA: 1.02
UROBILINOGEN UA: NEGATIVE

## 2014-01-29 MED ORDER — CIPROFLOXACIN HCL 250 MG PO TABS: 250.0000 mg | ORAL_TABLET | Freq: Two times a day (BID) | ORAL | Status: DC

## 2014-01-29 NOTE — Patient Instructions (Signed)
Drink plenty of water and start the antibiotics today.  We'll contact you with your lab report.  Take care.   

## 2014-01-29 NOTE — Progress Notes (Signed)
Pre visit review using our clinic review tool, if applicable. No additional management support is needed unless otherwise documented below in the visit note.  Dysuria: yes duration of symptoms: yes abdominal pain: no fevers:no back pain:no vomiting:no Has been taking cranberry tabs with some relief.  She can tolerate cipro.   ucx pending.   Meds, vitals, and allergies reviewed.   ROS: See HPI.  Otherwise negative.    GEN: nad, alert and oriented HEENT: mucous membranes moist NECK: supple CV: rrr.  PULM: ctab, no inc wob ABD: soft, +bs, suprapubic area not tender EXT: no edema SKIN: no acute rash BACK: no CVA pain

## 2014-01-30 NOTE — Assessment & Plan Note (Signed)
Likely cystitis.  Nontoxic.  Ucx, cipro. Fluids. F/u prn.  She agrees.

## 2014-02-01 LAB — URINE CULTURE: Colony Count: 45000

## 2014-02-11 DIAGNOSIS — L723 Sebaceous cyst: Secondary | ICD-10-CM | POA: Diagnosis not present

## 2014-02-11 DIAGNOSIS — L293 Anogenital pruritus, unspecified: Secondary | ICD-10-CM | POA: Diagnosis not present

## 2014-02-21 ENCOUNTER — Other Ambulatory Visit: Payer: Self-pay | Admitting: Family Medicine

## 2014-02-21 DIAGNOSIS — M810 Age-related osteoporosis without current pathological fracture: Secondary | ICD-10-CM

## 2014-02-25 ENCOUNTER — Other Ambulatory Visit (INDEPENDENT_AMBULATORY_CARE_PROVIDER_SITE_OTHER): Payer: Medicare Other

## 2014-02-25 DIAGNOSIS — E7800 Pure hypercholesterolemia, unspecified: Secondary | ICD-10-CM | POA: Diagnosis not present

## 2014-02-25 DIAGNOSIS — R7309 Other abnormal glucose: Secondary | ICD-10-CM

## 2014-02-25 DIAGNOSIS — R5381 Other malaise: Secondary | ICD-10-CM

## 2014-02-25 DIAGNOSIS — R5383 Other fatigue: Secondary | ICD-10-CM

## 2014-02-25 DIAGNOSIS — M899 Disorder of bone, unspecified: Secondary | ICD-10-CM

## 2014-02-25 DIAGNOSIS — M810 Age-related osteoporosis without current pathological fracture: Secondary | ICD-10-CM

## 2014-02-25 DIAGNOSIS — M949 Disorder of cartilage, unspecified: Secondary | ICD-10-CM | POA: Diagnosis not present

## 2014-02-25 DIAGNOSIS — E785 Hyperlipidemia, unspecified: Secondary | ICD-10-CM

## 2014-02-25 LAB — COMPREHENSIVE METABOLIC PANEL
ALK PHOS: 83 U/L (ref 39–117)
ALT: 20 U/L (ref 0–35)
AST: 21 U/L (ref 0–37)
Albumin: 4 g/dL (ref 3.5–5.2)
BILIRUBIN TOTAL: 0.7 mg/dL (ref 0.2–1.2)
BUN: 15 mg/dL (ref 6–23)
CO2: 29 meq/L (ref 19–32)
CREATININE: 0.8 mg/dL (ref 0.4–1.2)
Calcium: 9.1 mg/dL (ref 8.4–10.5)
Chloride: 105 mEq/L (ref 96–112)
GFR: 76.53 mL/min (ref >60.00)
Glucose, Bld: 108 mg/dL — ABNORMAL HIGH (ref 70–99)
Potassium: 4.3 mEq/L (ref 3.5–5.1)
Sodium: 141 mEq/L (ref 135–145)
Total Protein: 7.1 g/dL (ref 6.0–8.3)

## 2014-02-25 LAB — LIPID PANEL
CHOL/HDL RATIO: 4
Cholesterol: 236 mg/dL — ABNORMAL HIGH (ref 0–200)
HDL: 64 mg/dL (ref >39.00)
LDL Cholesterol: 156 mg/dL — ABNORMAL HIGH (ref 0–99)
NONHDL: 172
Triglycerides: 78 mg/dL (ref 0.0–149.0)
VLDL: 15.6 mg/dL (ref 0.0–40.0)

## 2014-02-25 LAB — VITAMIN D 25 HYDROXY (VIT D DEFICIENCY, FRACTURES): VITD: 30.84 ng/mL

## 2014-03-02 ENCOUNTER — Ambulatory Visit (INDEPENDENT_AMBULATORY_CARE_PROVIDER_SITE_OTHER): Payer: Medicare Other | Admitting: Family Medicine

## 2014-03-02 ENCOUNTER — Encounter: Payer: Self-pay | Admitting: Family Medicine

## 2014-03-02 VITALS — BP 132/84 | HR 77 | Temp 98.1°F | Ht 61.0 in | Wt 180.8 lb

## 2014-03-02 DIAGNOSIS — Z7189 Other specified counseling: Secondary | ICD-10-CM | POA: Insufficient documentation

## 2014-03-02 DIAGNOSIS — E785 Hyperlipidemia, unspecified: Secondary | ICD-10-CM

## 2014-03-02 DIAGNOSIS — R7309 Other abnormal glucose: Secondary | ICD-10-CM | POA: Diagnosis not present

## 2014-03-02 DIAGNOSIS — Z0000 Encounter for general adult medical examination without abnormal findings: Secondary | ICD-10-CM

## 2014-03-02 NOTE — Assessment & Plan Note (Signed)
D/w pt.  Similar to prev.  Can work on diet, carb intake.  Labs d/w pt.

## 2014-03-02 NOTE — Assessment & Plan Note (Signed)
Restart zetia, she'll notify me if she can/can't tolerate. We can set f/u labs later in the year if needed.  Current labs d/w pt.  D/w pt about diet and weight.

## 2014-03-02 NOTE — Patient Instructions (Addendum)
Check on getting a tetanus shot at the pharmacy or health department.  Try getting back on the zetia and notify me if you can tolerate it. If so, we can check your lipids later in the year.  Try to avoid sweets/sugars in the meantime.  Glad to see you.  Take care.  I would get a flu shot each fall.

## 2014-03-02 NOTE — Progress Notes (Signed)
Pre visit review using our clinic review tool, if applicable. No additional management support is needed unless otherwise documented below in the visit note.  I have personally reviewed the Medicare Annual Wellness questionnaire and have noted 1. The patient's medical and social history 2. Their use of alcohol, tobacco or illicit drugs 3. Their current medications and supplements 4. The patient's functional ability including ADL's, fall risks, home safety risks and hearing or visual             impairment. 5. Diet and physical activities 6. Evidence for depression or mood disorders  The patients weight, height, BMI have been recorded in the chart and visual acuity is per eye clinic.  I have made referrals, counseling and provided education to the patient based review of the above and I have provided the pt with a written personalized care plan for preventive services.  Provider list updated- see scanned forms.  Routine anticipatory guidance given to patient.  See health maintenance.  Flu 2014 Shingles 2009 PNA 2000 Tetanus 2005 Colon CA screening no due given her age.  D/w pt.  She agrees.  Breast cancer screening up to date.   DXA declined by patient.  D/w pt.   Advance directive d/w pt. Husband designated if patient were incapacitated.  If he is also incapacitated, then her kids are equally designated.   Cognitive function addressed- see scanned forms- and if abnormal then additional documentation follows.   Elevated Cholesterol: Using medications without problems: off meds.   Muscle aches: not from zetia, as she is off meds.  Unclear if her mood/cymbalta was the issue previously- improved on higher dose of cymbalta per psych Diet compliance:d/w pt Exercise:limited  PMH and SH reviewed  Meds, vitals, and allergies reviewed.   ROS: See HPI.  Otherwise negative.    GEN: nad, alert and oriented HEENT: mucous membranes moist NECK: supple w/o LA CV: rrr. PULM: ctab, no inc  wob ABD: soft, +bs EXT: no edema SKIN: no acute rash

## 2014-03-02 NOTE — Assessment & Plan Note (Signed)
Flu 2014 Shingles 2009 PNA 2000 Tetanus 2005 Colon CA screening no due given her age.  D/w pt.  She agrees.  Breast cancer screening up to date.   DXA declined by patient.  D/w pt.   Advance directive d/w pt. Husband designated if patient were incapacitated.  If he is also incapacitated, then her kids are equally designated.   Cognitive function addressed- see scanned forms- and if abnormal then additional documentation follows.

## 2014-05-04 ENCOUNTER — Telehealth: Payer: Self-pay

## 2014-05-04 DIAGNOSIS — E7800 Pure hypercholesterolemia, unspecified: Secondary | ICD-10-CM

## 2014-05-04 NOTE — Telephone Encounter (Signed)
Pt has been taking Zetia for 2 months without complications and pt wants to know when should recheck lab test.Please advise.

## 2014-05-05 NOTE — Telephone Encounter (Signed)
Now, orders are in. Thanks.

## 2014-05-05 NOTE — Telephone Encounter (Signed)
Lab appt scheduled.

## 2014-05-07 ENCOUNTER — Other Ambulatory Visit (INDEPENDENT_AMBULATORY_CARE_PROVIDER_SITE_OTHER): Payer: Medicare Other

## 2014-05-07 DIAGNOSIS — E7800 Pure hypercholesterolemia, unspecified: Secondary | ICD-10-CM

## 2014-05-07 DIAGNOSIS — R7309 Other abnormal glucose: Secondary | ICD-10-CM

## 2014-05-07 DIAGNOSIS — M810 Age-related osteoporosis without current pathological fracture: Secondary | ICD-10-CM

## 2014-05-07 DIAGNOSIS — M949 Disorder of cartilage, unspecified: Secondary | ICD-10-CM

## 2014-05-07 DIAGNOSIS — E785 Hyperlipidemia, unspecified: Secondary | ICD-10-CM

## 2014-05-07 DIAGNOSIS — M899 Disorder of bone, unspecified: Secondary | ICD-10-CM | POA: Diagnosis not present

## 2014-05-07 LAB — LIPID PANEL
Cholesterol: 207 mg/dL — ABNORMAL HIGH (ref 0–200)
HDL: 54.5 mg/dL (ref >39.00)
LDL CALC: 132 mg/dL — AB (ref 0–99)
NONHDL: 152.5
TRIGLYCERIDES: 104 mg/dL (ref 0.0–149.0)
Total CHOL/HDL Ratio: 4
VLDL: 20.8 mg/dL (ref 0.0–40.0)

## 2014-05-21 DIAGNOSIS — F329 Major depressive disorder, single episode, unspecified: Secondary | ICD-10-CM | POA: Diagnosis not present

## 2014-05-21 DIAGNOSIS — F061 Catatonic disorder due to known physiological condition: Secondary | ICD-10-CM | POA: Diagnosis not present

## 2014-06-10 ENCOUNTER — Encounter: Payer: Self-pay | Admitting: Family Medicine

## 2014-06-10 ENCOUNTER — Ambulatory Visit (INDEPENDENT_AMBULATORY_CARE_PROVIDER_SITE_OTHER): Payer: Medicare Other | Admitting: Family Medicine

## 2014-06-10 VITALS — BP 144/98 | HR 80 | Temp 98.0°F | Wt 181.8 lb

## 2014-06-10 DIAGNOSIS — R030 Elevated blood-pressure reading, without diagnosis of hypertension: Secondary | ICD-10-CM | POA: Insufficient documentation

## 2014-06-10 NOTE — Patient Instructions (Signed)
Cut back a little on portions and cut out some salt.   That should help.   If you BP stays up (consistently >140/>90), then let me know.  You may need to get a new cuff.  Take care.

## 2014-06-10 NOTE — Progress Notes (Signed)
Pre visit review using our clinic review tool, if applicable. No additional management support is needed unless otherwise documented below in the visit note.  She had some aches and abnormal dreams, possibly from zetia.  She just stopped the medicine recently and the dreams got better, less vivid.   She isn't on BP meds.  Hasn't been on BP meds prev.  At Dr. Danella SensingHejazi's clinic, her BP was 160/90.  She wasn't anxious at the time.  She has checked her BP at home on a home cuff, usually controlled on home checks, but she has gotten some error messages with her cuff.   No CP, not SOB.    Meds, vitals, and allergies reviewed.   ROS: See HPI.  Otherwise, noncontributory.  GEN: nad, alert and oriented HEENT: mucous membranes moist NECK: supple w/o LA CV: rrr PULM: ctab, no inc wob ABD: soft, +bs EXT: no edema  Recheck BP 148/98, with her cuff we got an error message, retry with her cuff 141/87

## 2014-06-10 NOTE — Assessment & Plan Note (Signed)
Wouldn't label her with HTN yet.  She may need to get a new cuff.  D/w pt.  Would cut salt and work on gradual weight loss.  She agrees.  Would be beneficial, even if she didn't have HTN.  If persistently elevated, then she'll notify me.  No new med for now. She agrees.

## 2014-06-18 ENCOUNTER — Ambulatory Visit (INDEPENDENT_AMBULATORY_CARE_PROVIDER_SITE_OTHER): Payer: Medicare Other

## 2014-06-18 DIAGNOSIS — Z23 Encounter for immunization: Secondary | ICD-10-CM | POA: Diagnosis not present

## 2014-07-10 ENCOUNTER — Emergency Department (HOSPITAL_COMMUNITY)
Admission: EM | Admit: 2014-07-10 | Discharge: 2014-07-10 | Disposition: A | Discharge status: Home / Self Care | Payer: Medicare Other | Attending: Emergency Medicine | Admitting: Emergency Medicine

## 2014-07-10 ENCOUNTER — Encounter (HOSPITAL_COMMUNITY): Payer: Self-pay

## 2014-07-10 ENCOUNTER — Telehealth: Payer: Self-pay | Admitting: Family Medicine

## 2014-07-10 ENCOUNTER — Emergency Department (HOSPITAL_COMMUNITY): Payer: Medicare Other

## 2014-07-10 DIAGNOSIS — R531 Weakness: Secondary | ICD-10-CM | POA: Diagnosis not present

## 2014-07-10 DIAGNOSIS — M6281 Muscle weakness (generalized): Secondary | ICD-10-CM | POA: Insufficient documentation

## 2014-07-10 DIAGNOSIS — Z8719 Personal history of other diseases of the digestive system: Secondary | ICD-10-CM | POA: Diagnosis not present

## 2014-07-10 DIAGNOSIS — M1990 Unspecified osteoarthritis, unspecified site: Secondary | ICD-10-CM | POA: Insufficient documentation

## 2014-07-10 DIAGNOSIS — M545 Low back pain, unspecified: Secondary | ICD-10-CM

## 2014-07-10 DIAGNOSIS — F329 Major depressive disorder, single episode, unspecified: Secondary | ICD-10-CM | POA: Insufficient documentation

## 2014-07-10 DIAGNOSIS — Z8669 Personal history of other diseases of the nervous system and sense organs: Secondary | ICD-10-CM | POA: Diagnosis not present

## 2014-07-10 DIAGNOSIS — Z87440 Personal history of urinary (tract) infections: Secondary | ICD-10-CM | POA: Insufficient documentation

## 2014-07-10 DIAGNOSIS — M797 Fibromyalgia: Secondary | ICD-10-CM | POA: Insufficient documentation

## 2014-07-10 DIAGNOSIS — I7090 Unspecified atherosclerosis: Secondary | ICD-10-CM | POA: Diagnosis not present

## 2014-07-10 DIAGNOSIS — Z9889 Other specified postprocedural states: Secondary | ICD-10-CM | POA: Diagnosis not present

## 2014-07-10 DIAGNOSIS — Z79899 Other long term (current) drug therapy: Secondary | ICD-10-CM | POA: Insufficient documentation

## 2014-07-10 DIAGNOSIS — Z7982 Long term (current) use of aspirin: Secondary | ICD-10-CM | POA: Insufficient documentation

## 2014-07-10 DIAGNOSIS — M810 Age-related osteoporosis without current pathological fracture: Secondary | ICD-10-CM | POA: Diagnosis not present

## 2014-07-10 DIAGNOSIS — R0602 Shortness of breath: Secondary | ICD-10-CM | POA: Insufficient documentation

## 2014-07-10 LAB — CBC WITH DIFFERENTIAL/PLATELET
BASOS ABS: 0 10*3/uL (ref 0.0–0.1)
BASOS PCT: 1 % (ref 0–1)
EOS ABS: 0.2 10*3/uL (ref 0.0–0.7)
Eosinophils Relative: 2 % (ref 0–5)
HCT: 44.4 % (ref 36.0–46.0)
HEMOGLOBIN: 15 g/dL (ref 12.0–15.0)
Lymphocytes Relative: 27 % (ref 12–46)
Lymphs Abs: 2 10*3/uL (ref 0.7–4.0)
MCH: 32.5 pg (ref 26.0–34.0)
MCHC: 33.8 g/dL (ref 30.0–36.0)
MCV: 96.1 fL (ref 78.0–100.0)
MONOS PCT: 7 % (ref 3–12)
Monocytes Absolute: 0.5 10*3/uL (ref 0.1–1.0)
NEUTROS ABS: 4.7 10*3/uL (ref 1.7–7.7)
NEUTROS PCT: 63 % (ref 43–77)
PLATELETS: 233 10*3/uL (ref 150–400)
RBC: 4.62 MIL/uL (ref 3.87–5.11)
RDW: 13.2 % (ref 11.5–15.5)
WBC: 7.3 10*3/uL (ref 4.0–10.5)

## 2014-07-10 LAB — BASIC METABOLIC PANEL
ANION GAP: 12 (ref 5–15)
BUN: 14 mg/dL (ref 6–23)
CO2: 29 mEq/L (ref 19–32)
Calcium: 9.9 mg/dL (ref 8.4–10.5)
Chloride: 102 mEq/L (ref 96–112)
Creatinine, Ser: 0.76 mg/dL (ref 0.50–1.10)
GFR, EST AFRICAN AMERICAN: 89 mL/min — AB (ref >90)
GFR, EST NON AFRICAN AMERICAN: 77 mL/min — AB (ref >90)
Glucose, Bld: 95 mg/dL (ref 70–99)
POTASSIUM: 4 meq/L (ref 3.7–5.3)
Sodium: 143 mEq/L (ref 137–147)

## 2014-07-10 LAB — URINALYSIS, ROUTINE W REFLEX MICROSCOPIC
Bilirubin Urine: NEGATIVE
GLUCOSE, UA: NEGATIVE mg/dL
Hgb urine dipstick: NEGATIVE
KETONES UR: NEGATIVE mg/dL
Leukocytes, UA: NEGATIVE
NITRITE: NEGATIVE
PH: 6.5 (ref 5.0–8.0)
PROTEIN: NEGATIVE mg/dL
Specific Gravity, Urine: 1.008 (ref 1.005–1.030)
Urobilinogen, UA: 0.2 mg/dL (ref 0.0–1.0)

## 2014-07-10 LAB — I-STAT TROPONIN, ED: Troponin i, poc: 0.01 ng/mL (ref 0.00–0.08)

## 2014-07-10 NOTE — Discharge Instructions (Signed)
Please read and follow all provided instructions.  Your diagnoses today include:  1. Generalized weakness   2. Weakness   3. SOB (shortness of breath)   4. Midline low back pain without sciatica     Tests performed today include:  Blood counts and electrolyes - normal  Heart test for muscle damage - normal  EKG - normal  Vital signs. See below for your results today.   Medications prescribed:   None  Take any prescribed medications only as directed.  Home care instructions:  Follow any educational materials contained in this packet.  Follow-up instructions: Please follow-up with your primary care provider in the next 3 days for further evaluation of your symptoms.   Return instructions:   Please return to the Emergency Department if you experience worsening symptoms.   Please return if you have any other emergent concerns.  Additional Information:  Your vital signs today were: BP 167/86 mmHg   Pulse 76   Temp(Src) 98.2 F (36.8 C) (Oral)   Resp 16   SpO2 96% If your blood pressure (BP) was elevated above 135/85 this visit, please have this repeated by your doctor within one month. --------------

## 2014-07-10 NOTE — ED Provider Notes (Signed)
CSN: [id number]     Arrival date & time 07/10/14  1159 History   First MD Initiated Contact with Patient 07/10/14 1231     Chief Complaint  Patient presents with  . Weakness    (Consider location/radiation/quality/duration/timing/severity/associated sxs/prior Treatment) HPI Comments: Patient with history of depression, back surgery 1 year ago for spinal stenosis presents with 3 days of generalized weakness. Patient was unable to see her primary care physician today so she was directed to go to the emergency department. Patient has noted generalized upper and lower extremity weakness. She has needed some assistance with walking at times for her husband. Patient states that she has had some difficulty writing but is able to hold a glass without difficulty. She has not fallen or dropped any objects. Symptoms are bilateral. Patient denies signs of stroke including: facial droop, slurred speech, aphasia. Patient denies fever, URI symptoms, chest pain, cough, shortness of breath, nausea, vomiting, diarrhea, abdominal pain, dysuria, skin rashes. Patient has some intermittent lower back pain but she states that this is not unusual since her surgery. Her back pain now is her typical back pain. No red flag signs and symptoms of lower back pain. No treatments prior to arrival. Patient states that she has been taking her medications as prescribed and has been eating and drinking normally.   Patient is a 80 y.o. female presenting with weakness. The history is provided by the patient and medical records.  Weakness Associated symptoms include weakness. Pertinent negatives include no abdominal pain, chest pain, coughing, fever, headaches, myalgias, nausea, rash, sore throat or vomiting.    Past Medical History  Diagnosis Date  . Hyperlipidemia   . GERD (gastroesophageal reflux disease)   . Osteoporosis   . Arthritis   . Fibromyalgia   . UTI (lower urinary tract infection)   . Urinary incontinence   .  Depression 1993, 2000  . Blood transfusion without reported diagnosis     37 yrs ago -s/p back surgery  . OSA (obstructive sleep apnea)     dx 2010-had a cpap-does not use-"uses special mouthpiece now"  . H/O hiatal hernia    Past Surgical History  Procedure Laterality Date  . Cardiovascular stress test  2009    Normal, Dr. Mayford Knifeurner  . Breast biopsy  1995    x 2  . Abdominal hysterectomy  1976  . Back fusions  1977    missing vertebra  . Childbirth  [id number],1956,1957,1960  . Ovarian cyst removal  1964  . Appendectomy  1964  . Dilatation and currettage  1964  . Cataract extraction, bilateral Bilateral 2005  . Breast biopsy  07/10/2012    Procedure: BREAST BIOPSY WITH NEEDLE LOCALIZATION;  Surgeon: Emelia LoronMatthew Wakefield, MD;  Location: [facility];  Service: General;  Laterality: Right;  . Cystoscopy N/A 11/25/2012    Procedure: CYSTOSCOPY;  Surgeon: Martina SinnerScott A MacDiarmid, MD;  [facility] [place];  Service: Urology;  Laterality: N/A;  . Pubovaginal sling N/A 11/25/2012    Procedure: Leonides GrillsPUBO-VAGINAL SLING;  Surgeon: Martina SinnerScott A MacDiarmid, MD;  [facility] [place];  Service: Urology;  Laterality: N/A;  . Back surgery      fusions lumbar area  . Lumbar laminectomy/decompression microdiscectomy N/A 05/01/2013    Procedure: COMPLETE DECOMPRESSIVE LUMBAR LAMINECTOMY L3-L4 MICRODISCECTOMY L3-L4 ON THE LEFT;  Surgeon: Jacki Conesonald A Gioffre, MD;  Location: [facility];  Service: Orthopedics;  Laterality: N/A;   Family History  Problem Relation Age of Onset  . Arthritis Mother   .  Kidney disease Maternal Grandmother   . Stroke Father   . Hypertension Father   . Heart disease Brother     Died during CABG  . Hypertension Sister   . Dementia Sister   . Aneurysm Sister     Brain  . Parkinsonism Sister   . Colon cancer Neg Hx   . Breast cancer Neg Hx    History  Substance Use Topics  . Smoking status: Never Smoker   . Smokeless tobacco: Never Used  . Alcohol Use: No    OB History    No data available     Review of Systems  Constitutional: Negative for fever.  HENT: Negative for rhinorrhea and sore throat.   Eyes: Negative for redness.  Respiratory: Negative for cough and shortness of breath.   Cardiovascular: Negative for chest pain.  Gastrointestinal: Negative for nausea, vomiting, abdominal pain and diarrhea.  Genitourinary: Negative for dysuria and hematuria.  Musculoskeletal: Positive for back pain. Negative for myalgias.  Skin: Negative for rash.  Neurological: Positive for weakness. Negative for headaches.    Allergies  Alendronate sodium; Cefaclor; Cephalexin; Chlordiazepoxide; Clavulanic acid; Digoxin; Hydrocodone-acetaminophen; Nitrofurantoin; Oxcarbazepine; Pb-hyoscy-atropine-scopolamine; Simvastatin; Streptomycin; Sulfonamide derivatives; Tetracycline; Zetia; and Omeprazole  Home Medications   Prior to Admission medications   Medication Sig Start Date End Date Taking? Authorizing Provider  aspirin 81 MG tablet Take 81 mg by mouth daily.    Historical Provider, MD  cholecalciferol (VITAMIN D) 1000 UNITS tablet Take 1,000 Units by mouth daily.      Historical Provider, MD  DULoxetine (CYMBALTA) 60 MG capsule Take 80 mg by mouth every morning.     Historical Provider, MD  folic acid (FOLVITE) 800 MCG tablet Take 800 mcg by mouth daily.     Historical Provider, MD  gabapentin (NEURONTIN) 300 MG capsule Take 300 mg by mouth 3 (three) times daily.     Historical Provider, MD  lamoTRIgine (LAMICTAL) 100 MG tablet Take 100 mg by mouth at bedtime.    Historical Provider, MD  OLANZapine (ZYPREXA) 2.5 MG tablet Take 2.5 mg by mouth at bedtime.    Historical Provider, MD   BP 167/86 mmHg  Pulse 76  Temp(Src) 98.2 F (36.8 C) (Oral)  Resp 18  SpO2 96%   Physical Exam  Constitutional: She is oriented to person, place, and time. She appears well-developed and well-nourished.  HENT:  Head: Normocephalic and atraumatic.  Right Ear: Tympanic  membrane, external ear and ear canal normal.  Left Ear: Tympanic membrane, external ear and ear canal normal.  Nose: Nose normal.  Mouth/Throat: Uvula is midline, oropharynx is clear and moist and mucous membranes are normal. No oropharyngeal exudate.  Eyes: Conjunctivae, EOM and lids are normal. Pupils are equal, round, and reactive to light. Right eye exhibits no discharge. Left eye exhibits no discharge. Right eye exhibits no nystagmus. Left eye exhibits no nystagmus.  Neck: Normal range of motion. Neck supple.  Cardiovascular: Normal rate, regular rhythm and normal heart sounds.   No murmur heard. Pulmonary/Chest: Effort normal and breath sounds normal. No respiratory distress. She has no wheezes. She has no rales.  Abdominal: Soft. There is no tenderness. There is no rebound and no guarding.  Musculoskeletal: She exhibits no edema or tenderness.       Cervical back: She exhibits normal range of motion, no tenderness and no bony tenderness.  Healed surgical scar, midline lumbar spine  Neurological: She is alert and oriented to person, place, and time. She has normal strength and  normal reflexes. No cranial nerve deficit or sensory deficit. She displays a negative Romberg sign. Coordination and gait normal. GCS eye subscore is 4. GCS verbal subscore is 5. GCS motor subscore is 6.  Skin: Skin is warm and dry.  Psychiatric: She has a normal mood and affect.  Nursing note and vitals reviewed.   ED Course  Procedures (including critical care time) Labs Review Labs Reviewed - No data to display  Imaging Review No results found.   EKG Interpretation None       1:00 PM Patient seen and examined. Work-up initiated.   Vital signs reviewed and are as follows: BP 167/86 mmHg  Pulse 76  Temp(Src) 98.2 F (36.8 C) (Oral)  Resp 18  SpO2 96%  3:40 PM Labs and imaging are reassuring. Patient discussed with Dr. Rhunette CroftNanavati. Patient cleared for discharge to home.   Patient urged to return  with worsening symptoms or other concerns. Patient verbalized understanding and agrees with plan.    MDM   Final diagnoses:  SOB (shortness of breath)  Generalized weakness  Midline low back pain without sciatica   Patient with generalized weakness, upper and lower extremities, bilaterally. No signs of stroke. No concern for spinal cord etiology. No concern for cauda equina or other sequelae of her previous spinal surgery. Labs and imaging are completely normal. She is neurologically intact.   No dangerous or life-threatening conditions suspected or identified by history, physical exam, and by work-up. No indications for hospitalization identified.      Renne CriglerJoshua Raelynn Corron, PA-C 07/10/14 1547  Derwood KaplanAnkit Nanavati, MD 07/12/14 1714

## 2014-07-10 NOTE — Telephone Encounter (Signed)
Patient Information:  Caller Name: Robin Juarez  Phone: [phone]  Patient: Robin Juarez, Robin Juarez  Gender: Female  DOB: 08-07-1931  Age: 80 Years  PCP: Robin Juarez, Robin Juarez(Robin Juarez) [facility](Family Practice)  Office Follow Up:  Does the office need to follow up with this patient?: No  Instructions For The Office: N/A  RN Note:  Report to Bonita QuinLinda who discussed with Dr. Para Marchuncan who advises ED Eval. Patient informed and she [facility].  Symptoms  Reason For Call & Symptoms: Generalized Weakenss x past 3 days. Patient states to the point of unable to grasp objects as strongly as usual and requiring assistance walking. Having occasional episodes of "feeling like lungs are involved, like I just have to take a deep breath."  Reviewed Health History In EMR: Yes  Reviewed Medications In EMR: Yes  Reviewed Allergies In EMR: Yes  Reviewed Surgeries / Procedures: Yes  Date of Onset of Symptoms: 07/08/2014  Guideline(s) Used:  Weakness (Generalized) and Fatigue  Disposition Per Guideline:   Go to Office Now  Reason For Disposition Reached:   Moderate weakness (i.e., interferes with work, school, normal activities) and cause unknown  Advice Given:  N/A  Patient Will Follow Care Advice:  YES

## 2014-07-10 NOTE — ED Notes (Signed)
Patient transported to X-ray 

## 2014-07-10 NOTE — Telephone Encounter (Signed)
Spoke with Megan from [facility] re: pt's symptoms.  Spoke with Dr. Para Marchuncan who recommended ER eval / lt

## 2014-07-10 NOTE — ED Notes (Signed)
Ambulated pt around hall. Stand by assist only. Pt did C/O of weakness while walking.

## 2014-07-10 NOTE — ED Notes (Addendum)
Pt states feeling generally weak over last week. States more difficulty w/ writing and ADLs. Over last week having increased problems with lower back. Had surgery on lower back last year for compressed nerve, has had no problems since surgery up until now. PA and student in room w/ pt at this time.  Denies N/V/D, fever/chills, urinary problems, cough. Denies any pain. States occasional problems with balance, but mostly experiencing noticeable decrease in strength over last week

## 2014-07-10 NOTE — ED Notes (Signed)
Per pt,  Generalized weakness for several days now.  Pt states just hasn't had much energy.  Eating ok. No change in bowel and bladder.  No fever.  Lower back pain but had surgery x 1 week ago.

## 2014-07-13 ENCOUNTER — Ambulatory Visit (INDEPENDENT_AMBULATORY_CARE_PROVIDER_SITE_OTHER): Payer: Medicare Other | Admitting: Family Medicine

## 2014-07-13 ENCOUNTER — Encounter: Payer: Self-pay | Admitting: Family Medicine

## 2014-07-13 VITALS — BP 160/100 | HR 78 | Temp 98.6°F | Wt 179.5 lb

## 2014-07-13 DIAGNOSIS — R2689 Other abnormalities of gait and mobility: Secondary | ICD-10-CM

## 2014-07-13 DIAGNOSIS — R29818 Other symptoms and signs involving the nervous system: Secondary | ICD-10-CM

## 2014-07-13 NOTE — Patient Instructions (Signed)
I'll send a note over to Dr. Wynonia LawmanHejazi.  I'm not certain, but I thin this may be med related.  Don't change your meds in the meantime.  Take care.  Glad to see you.

## 2014-07-13 NOTE — Progress Notes (Signed)
Pre visit review using our clinic review tool, if applicable. No additional management support is needed unless otherwise documented below in the visit note.  ER note reviewed. My understanding from triage was that she has new extremity weakness and I was concerned for CVA, thus ER dispo.  ER notes reviewed, no acute process noted.   "I've been wobbly for a few days."  She has B upper and lower ext sx.  She doesn't feel well balanced but no vertigo or syncope, no presyncope. Her BP has been ~130/80 at home and she has no CP.  No BLE edema. No focal neuro changes.    Meds, vitals, and allergies reviewed.   ROS: See HPI.  Otherwise, noncontributory.  GEN: nad, alert and oriented HEENT: mucous membranes moist, OP wnl, TM wnl NECK: supple w/o LA CV: rrr.  PULM: ctab, no inc wob ABD: soft, +bs EXT: no edema SKIN: no acute rash CN 2-12 wnl B, S/S/DTR wnl x4

## 2014-07-14 DIAGNOSIS — R2689 Other abnormalities of gait and mobility: Secondary | ICD-10-CM | POA: Insufficient documentation

## 2014-07-14 NOTE — Assessment & Plan Note (Signed)
She doesn't have focal neuro changes.  Prev ER w/u unremarkable.  D/w pt.   She had done relatively well off statin med in the recent past, until the recent sx developed.   I question if this could be a med effect.  She have f/u with Dr. Wynonia LawmanHejazi pending.  I didn't change her meds, but I would like his input.   I'll forward this note in the meantime.

## 2014-07-23 DIAGNOSIS — G4700 Insomnia, unspecified: Secondary | ICD-10-CM | POA: Diagnosis not present

## 2014-07-23 DIAGNOSIS — F3010 Manic episode without psychotic symptoms, unspecified: Secondary | ICD-10-CM | POA: Diagnosis not present

## 2014-07-30 DIAGNOSIS — F3181 Bipolar II disorder: Secondary | ICD-10-CM | POA: Diagnosis not present

## 2014-07-30 DIAGNOSIS — G4700 Insomnia, unspecified: Secondary | ICD-10-CM | POA: Diagnosis not present

## 2014-08-01 DIAGNOSIS — Z4789 Encounter for other orthopedic aftercare: Secondary | ICD-10-CM | POA: Diagnosis not present

## 2014-08-03 ENCOUNTER — Telehealth: Payer: Self-pay

## 2014-08-03 ENCOUNTER — Encounter: Payer: Self-pay | Admitting: Family Medicine

## 2014-08-03 ENCOUNTER — Ambulatory Visit (INDEPENDENT_AMBULATORY_CARE_PROVIDER_SITE_OTHER): Payer: Medicare Other | Admitting: Family Medicine

## 2014-08-03 VITALS — BP 160/90 | HR 94 | Temp 98.8°F | Wt 179.8 lb

## 2014-08-03 DIAGNOSIS — R202 Paresthesia of skin: Secondary | ICD-10-CM | POA: Diagnosis not present

## 2014-08-03 DIAGNOSIS — R2990 Unspecified symptoms and signs involving the nervous system: Secondary | ICD-10-CM

## 2014-08-03 NOTE — Telephone Encounter (Signed)
Pt left v/m; pt was referred to ED on 07/10/14 for evaluation; since then pt has seen back surgeon and the back surgeon said pts symptoms are not related to her spine and thinks pt has had mini stroke; back surgeon wants pt to have brain and carotid artery scans; pt wants these test scheduled by Dr Para Marchuncan and request cb.

## 2014-08-03 NOTE — Patient Instructions (Signed)
Shirlee LimerickMarion will call about your referrals for the carotid study and the MRI. We'll go from there.  I'll talk to Dr. Wynonia LawmanHejazi in the meantime.   Take care.  Glad to see you.

## 2014-08-03 NOTE — Progress Notes (Signed)
Pre visit review using our clinic review tool, if applicable. No additional management support is needed unless otherwise documented below in the visit note.  She still has some diffuse weakness in her B legs.  She has known spine pathology, saw ortho and thought her sx at the ER weren't related to her spine.  She has R handed sx for one day, with a writing change, but that is back to normal.   Her gait is still affected, with both legs affected equally.  She feels like her balance if affected.    No vertigo.  No FCNAVD.   She has felt a a few seconds of "freezing up" with diffuse muscle tightness, but not twitching.   She has had a tremor in the L arm for years, but that isn't increased.  Chronically on olanzapine.    Meds, vitals, and allergies reviewed.   ROS: See HPI.  Otherwise, noncontributory.  nad ncat Mmm OP wnl Neck supple, no LA, no bruit rrr ctab Abd soft, not ttp CN 2-12 wnl B, S/S/DTR wnl x4

## 2014-08-03 NOTE — Telephone Encounter (Signed)
See OV note.  

## 2014-08-03 NOTE — Assessment & Plan Note (Addendum)
Unclear if she has more than 1 issue- ie prev TIA with the arm sx and med effect/parkinsonian effect from olanzapine with freezing and dec smiling.  Will check carotid and MRI brain.  Will d/w Dr. Wynonia LawmanHejazi from psych about her meds.  Patient is in agreement.  No acute findings that would need inpatient treatment.   >25 minutes spent in face to face time with patient, >50% spent in counselling or coordination of care.  Addendum 08/05/14.  Called and LMOVM with Dr. Wynonia LawmanHejazi.  Will await return call.

## 2014-08-04 ENCOUNTER — Other Ambulatory Visit (HOSPITAL_COMMUNITY): Payer: Self-pay | Admitting: *Deleted

## 2014-08-04 DIAGNOSIS — R202 Paresthesia of skin: Secondary | ICD-10-CM

## 2014-08-06 ENCOUNTER — Ambulatory Visit
Admission: RE | Admit: 2014-08-06 | Discharge: 2014-08-06 | Disposition: A | Discharge status: Home / Self Care | Payer: Medicare Other | Source: Ambulatory Visit | Attending: Family Medicine | Admitting: Family Medicine

## 2014-08-06 DIAGNOSIS — I6782 Cerebral ischemia: Secondary | ICD-10-CM | POA: Diagnosis not present

## 2014-08-06 DIAGNOSIS — R2990 Unspecified symptoms and signs involving the nervous system: Secondary | ICD-10-CM

## 2014-08-06 DIAGNOSIS — I639 Cerebral infarction, unspecified: Secondary | ICD-10-CM | POA: Diagnosis not present

## 2014-08-07 ENCOUNTER — Encounter (INDEPENDENT_AMBULATORY_CARE_PROVIDER_SITE_OTHER): Payer: Medicare Other

## 2014-08-07 DIAGNOSIS — R202 Paresthesia of skin: Secondary | ICD-10-CM | POA: Diagnosis not present

## 2014-08-07 DIAGNOSIS — I6523 Occlusion and stenosis of bilateral carotid arteries: Secondary | ICD-10-CM

## 2014-08-09 ENCOUNTER — Other Ambulatory Visit: Payer: Self-pay | Admitting: Family Medicine

## 2014-08-09 DIAGNOSIS — R2689 Other abnormalities of gait and mobility: Secondary | ICD-10-CM

## 2014-08-09 MED ORDER — ASPIRIN EC 325 MG PO TBEC: 325.0000 mg | DELAYED_RELEASE_TABLET | Freq: Every day | ORAL | Status: DC

## 2014-08-12 ENCOUNTER — Telehealth: Payer: Self-pay | Admitting: Family Medicine

## 2014-08-12 ENCOUNTER — Emergency Department (HOSPITAL_COMMUNITY): Payer: Medicare Other

## 2014-08-12 ENCOUNTER — Emergency Department (HOSPITAL_COMMUNITY)
Admission: EM | Admit: 2014-08-12 | Discharge: 2014-08-13 | Disposition: A | Discharge status: Home / Self Care | Payer: Medicare Other | Attending: Emergency Medicine | Admitting: Emergency Medicine

## 2014-08-12 ENCOUNTER — Encounter (HOSPITAL_COMMUNITY): Payer: Self-pay | Admitting: *Deleted

## 2014-08-12 DIAGNOSIS — R079 Chest pain, unspecified: Secondary | ICD-10-CM

## 2014-08-12 DIAGNOSIS — Z79899 Other long term (current) drug therapy: Secondary | ICD-10-CM | POA: Diagnosis not present

## 2014-08-12 DIAGNOSIS — Z8639 Personal history of other endocrine, nutritional and metabolic disease: Secondary | ICD-10-CM | POA: Insufficient documentation

## 2014-08-12 DIAGNOSIS — F329 Major depressive disorder, single episode, unspecified: Secondary | ICD-10-CM | POA: Diagnosis not present

## 2014-08-12 DIAGNOSIS — M1990 Unspecified osteoarthritis, unspecified site: Secondary | ICD-10-CM | POA: Insufficient documentation

## 2014-08-12 DIAGNOSIS — Z7982 Long term (current) use of aspirin: Secondary | ICD-10-CM | POA: Insufficient documentation

## 2014-08-12 DIAGNOSIS — R0602 Shortness of breath: Secondary | ICD-10-CM | POA: Diagnosis not present

## 2014-08-12 DIAGNOSIS — M797 Fibromyalgia: Secondary | ICD-10-CM | POA: Insufficient documentation

## 2014-08-12 DIAGNOSIS — Z8669 Personal history of other diseases of the nervous system and sense organs: Secondary | ICD-10-CM | POA: Diagnosis not present

## 2014-08-12 DIAGNOSIS — R0789 Other chest pain: Secondary | ICD-10-CM | POA: Insufficient documentation

## 2014-08-12 DIAGNOSIS — Z8719 Personal history of other diseases of the digestive system: Secondary | ICD-10-CM | POA: Diagnosis not present

## 2014-08-12 DIAGNOSIS — Z87440 Personal history of urinary (tract) infections: Secondary | ICD-10-CM | POA: Insufficient documentation

## 2014-08-12 LAB — URINALYSIS, ROUTINE W REFLEX MICROSCOPIC
Bilirubin Urine: NEGATIVE
Glucose, UA: NEGATIVE mg/dL
Hgb urine dipstick: NEGATIVE
Ketones, ur: NEGATIVE mg/dL
Leukocytes, UA: NEGATIVE
Nitrite: NEGATIVE
Protein, ur: NEGATIVE mg/dL
Specific Gravity, Urine: 1.029 (ref 1.005–1.030)
Urobilinogen, UA: 0.2 mg/dL (ref 0.0–1.0)
pH: 6 (ref 5.0–8.0)

## 2014-08-12 LAB — CBC
HCT: 41.3 % (ref 36.0–46.0)
Hemoglobin: 13.9 g/dL (ref 12.0–15.0)
MCH: 32.3 pg (ref 26.0–34.0)
MCHC: 33.7 g/dL (ref 30.0–36.0)
MCV: 95.8 fL (ref 78.0–100.0)
Platelets: 249 10*3/uL (ref 150–400)
RBC: 4.31 MIL/uL (ref 3.87–5.11)
RDW: 13.3 % (ref 11.5–15.5)
WBC: 8.4 10*3/uL (ref 4.0–10.5)

## 2014-08-12 LAB — BASIC METABOLIC PANEL
ANION GAP: 7 (ref 5–15)
BUN: 16 mg/dL (ref 6–23)
CO2: 29 mmol/L (ref 19–32)
Calcium: 10 mg/dL (ref 8.4–10.5)
Chloride: 103 mEq/L (ref 96–112)
Creatinine, Ser: 1.05 mg/dL (ref 0.50–1.10)
GFR calc Af Amer: 56 mL/min — ABNORMAL LOW (ref >90)
GFR, EST NON AFRICAN AMERICAN: 48 mL/min — AB (ref >90)
Glucose, Bld: 104 mg/dL — ABNORMAL HIGH (ref 70–99)
POTASSIUM: 3.6 mmol/L (ref 3.5–5.1)
SODIUM: 139 mmol/L (ref 135–145)

## 2014-08-12 LAB — TROPONIN I
Troponin I: <0.03 ng/mL (ref <0.031)
Troponin I: <0.03 ng/mL (ref <0.031)

## 2014-08-12 LAB — BRAIN NATRIURETIC PEPTIDE: B NATRIURETIC PEPTIDE 5: 16.7 pg/mL (ref 0.0–100.0)

## 2014-08-12 NOTE — Telephone Encounter (Signed)
Pt calls in this morning because she has some questions regarding her stroke that she wants to ask Dr Para Marchuncan about. She has spoken with Lugene but has questions for Dr Para Marchuncan.

## 2014-08-12 NOTE — ED Provider Notes (Signed)
CSN: [id number]     Arrival date & time 08/12/14  2053 History   First MD Initiated Contact with Patient 08/12/14 2119     Chief Complaint  Patient presents with  . Chest Pain     (Consider location/radiation/quality/duration/timing/severity/associated sxs/prior Treatment) HPI Patient presents to the emergency department with intermittent chest pain over the last week.  The patient states that she is here to get her heart checked out.  Patient states that she is not currently having any pain.  She states yesterday was the last time she had this pain.  She states the pain lasts for seconds at a time and seems to increase with certain movements and the pain goes to her back.  The patient denies shortness of breath, diaphoresis, dizziness, dizziness, headache, blurred vision, back pain, neck pain, nausea, vomiting, diarrhea, abdominal pain or syncope.  The patient states that she has never had this type of pain in the past.  Patient states she has no cardiac history Past Medical History  Diagnosis Date  . Hyperlipidemia   . GERD (gastroesophageal reflux disease)   . Osteoporosis   . Arthritis   . Fibromyalgia   . UTI (lower urinary tract infection)   . Urinary incontinence   . Depression 1993, 2000  . Blood transfusion without reported diagnosis     37 yrs ago -s/p back surgery  . OSA (obstructive sleep apnea)     dx 2010-had a cpap-does not use-"uses special mouthpiece now"  . H/O hiatal hernia    Past Surgical History  Procedure Laterality Date  . Cardiovascular stress test  2009    Normal, Dr. Mayford Knifeurner  . Breast biopsy  1995    x 2  . Abdominal hysterectomy  1976  . Back fusions  1977    missing vertebra  . Childbirth  [phone],1956,1957,1960  . Ovarian cyst removal  1964  . Appendectomy  1964  . Dilatation and currettage  1964  . Cataract extraction, bilateral Bilateral 2005  . Breast biopsy  07/10/2012    Procedure: BREAST BIOPSY WITH NEEDLE LOCALIZATION;  Surgeon: Emelia LoronMatthew  Wakefield, MD;  Location: [facility];  Service: General;  Laterality: Right;  . Cystoscopy N/A 11/25/2012    Procedure: CYSTOSCOPY;  Surgeon: Martina SinnerScott A MacDiarmid, MD;  [facility] [place];  Service: Urology;  Laterality: N/A;  . Pubovaginal sling N/A 11/25/2012    Procedure: Leonides GrillsPUBO-VAGINAL SLING;  Surgeon: Martina SinnerScott A MacDiarmid, MD;  [facility];  Service: Urology;  Laterality: N/A;  . Back surgery      fusions lumbar area  . Lumbar laminectomy/decompression microdiscectomy N/A 05/01/2013    Procedure: COMPLETE DECOMPRESSIVE LUMBAR LAMINECTOMY L3-L4 MICRODISCECTOMY L3-L4 ON THE LEFT;  Surgeon: Jacki Conesonald A Gioffre, MD;  Location: [facility];  Service: Orthopedics;  Laterality: N/A;   Family History  Problem Relation Age of Onset  . Arthritis Mother   . Kidney disease Maternal Grandmother   . Stroke Father   . Hypertension Father   . Heart disease Brother     Died during CABG  . Hypertension Sister   . Dementia Sister   . Aneurysm Sister     Brain  . Parkinsonism Sister   . Colon cancer Neg Hx   . Breast cancer Neg Hx    History  Substance Use Topics  . Smoking status: Never Smoker   . Smokeless tobacco: Never Used  . Alcohol Use: No   OB History    No data available  Review of Systems All other systems negative except as documented in the HPI. All pertinent positives and negatives as reviewed in the HPI.   Allergies  Digoxin; Nitrofurantoin; Alendronate sodium; Hydrocodone-acetaminophen; Simvastatin; Sulfonamide derivatives; Augmentin; Cefaclor; Cephalexin; Chlordiazepoxide; Clavulanic acid; Omeprazole; Oxcarbazepine; Pb-hyoscy-atropine-scopolamine; Streptomycin; Tetracycline; and Zetia  Home Medications   Prior to Admission medications   Medication Sig Start Date End Date Taking? Authorizing Provider  aspirin EC 325 MG tablet Take 1 tablet (325 mg total) by mouth daily. 08/09/14  Yes Joaquim Nam, MD  cholecalciferol  (VITAMIN D) 1000 UNITS tablet Take 1,000 Units by mouth daily.     Yes Historical Provider, MD  DULoxetine (CYMBALTA) 60 MG capsule Take 60 mg by mouth every morning.    Yes Historical Provider, MD  folic acid (FOLVITE) 800 MCG tablet Take 800 mcg by mouth daily.    Yes Historical Provider, MD  gabapentin (NEURONTIN) 300 MG capsule Take 300 mg by mouth 3 (three) times daily.    Yes Historical Provider, MD  ibuprofen (ADVIL,MOTRIN) 200 MG tablet Take 200 mg by mouth every 6 (six) hours as needed for moderate pain.   Yes Historical Provider, MD  KRILL OIL OMEGA-3 PO Take 1 capsule by mouth daily.   Yes Historical Provider, MD  lamoTRIgine (LAMICTAL) 100 MG tablet Take 100 mg by mouth at bedtime.   Yes Historical Provider, MD  OLANZapine (ZYPREXA) 2.5 MG tablet Take 1.25 mg by mouth at bedtime.    Yes Historical Provider, MD   BP 163/66 mmHg  Pulse 72  Temp(Src) 98 F (36.7 C) (Oral)  Resp 17  SpO2 96% Physical Exam  Constitutional: She is oriented to person, place, and time. She appears well-developed and well-nourished. No distress.  HENT:  Head: Normocephalic and atraumatic.  Mouth/Throat: Oropharynx is clear and moist.  Eyes: Pupils are equal, round, and reactive to light.  Neck: Normal range of motion. Neck supple.  Cardiovascular: Normal rate, regular rhythm and normal heart sounds.  Exam reveals no gallop and no friction rub.   No murmur heard. Pulmonary/Chest: Effort normal and breath sounds normal. No respiratory distress.  Abdominal: Soft. Bowel sounds are normal. She exhibits no distension. There is no tenderness.  Musculoskeletal: She exhibits no edema.  Neurological: She is alert and oriented to person, place, and time. She exhibits normal muscle tone. Coordination normal.  Skin: Skin is warm and dry. No erythema.  Nursing note and vitals reviewed.   ED Course  Procedures (including critical care time) Labs Review Labs Reviewed  BASIC METABOLIC PANEL - Abnormal; Notable  for the following:    Glucose, Bld 104 (*)    GFR calc non Af Amer 48 (*)    GFR calc Af Amer 56 (*)    All other components within normal limits  CBC  BRAIN NATRIURETIC PEPTIDE  TROPONIN I  URINALYSIS, ROUTINE W REFLEX MICROSCOPIC  TROPONIN I    Imaging Review Dg Chest 2 View  08/12/2014   CLINICAL DATA:  Left-sided chest pain and shortness of breath for 3 weeks  EXAM: CHEST  2 VIEW  COMPARISON:  07/10/2014  FINDINGS: The heart size and mediastinal contours are within normal limits. Both lungs are clear. The visualized skeletal structures are unremarkable. Mild lumbar disc degenerative change noted.  IMPRESSION: No active cardiopulmonary disease.   Electronically Signed   By: Christiana Pellant M.D.   On: 08/12/2014 21:59    Date: 08/12/2014  Rate: 77  Rhythm: normal sinus rhythm  QRS Axis: normal  Intervals:  normal  ST/T Wave abnormalities: normal  Conduction Disutrbances:none  Narrative Interpretation:   Old EKG Reviewed: unchanged    MDM   Final diagnoses:  Chest pain   Patient's chest pain lasts for seconds at a time and seems atypical for cardiac chest pain.  Patient is a very well-appearing [age] year old female I have advised the patient that she will be given cardiology follow-up as a precaution, and we cannot totally eliminate her heart as cause for her chest pain but seems unlikely.  The patient is PERC negative and low risk based on well's criteria.  Patient does not have any calf pain or shortness of breath, or hemoptysis    Carlyle DollyChristopher W Maxene Byington, PA-C 08/12/14 2338  Flint MelterElliott L Wentz, MD 08/13/14 [phone]

## 2014-08-12 NOTE — ED Notes (Signed)
Lawyer, PA at bedside.  

## 2014-08-12 NOTE — ED Notes (Signed)
The pt is c/o lt upper chest pain and some sob for 3 weeks.  No sob now. No oain noiw

## 2014-08-13 ENCOUNTER — Telehealth: Payer: Self-pay

## 2014-08-13 ENCOUNTER — Telehealth: Payer: Self-pay | Admitting: Family Medicine

## 2014-08-13 NOTE — Telephone Encounter (Signed)
PLEASE NOTE: All timestamps contained within this report are represented as [place] Time. CONFIDENTIALTY NOTICE: This fax transmission is intended only for the addressee. It contains information that is legally privileged, confidential or otherwise protected from use or disclosure. If you are not the intended recipient, you are strictly prohibited from reviewing, disclosing, copying using or disseminating any of this information or taking any action in reliance on or regarding this information. If you have received this fax in error, please notify us immediately by telephone so that we can arrange for its return to us. Phone: [phone], Toll-Free: [phone], Fax: [phone] Page: 1 of 2 Call Id: [id number] [facility] Client TELEPHONE ADVICE RECORD [facility] Patient Name: Robin Juarez GenreMARTHA Dubie Gender: Female DOB: Feb 08, 1931 Age: 80 Y 1 M 24 D Return Phone Number: [phone] (Primary) Address: [place]Zip[place] [id number] Client [facility]  [facility] - Night Physician Raechel Acheuncan, Shaw Contact Type Call Call Type Triage / Clinical Caller Name Perlie Goldrnest Gassert Relationship To Patient Spouse Return Phone Number [phone] (Primary) Chief Complaint Health information question (non symptomatic) Initial Comment Caller states his wife had a stroke 3 weeks ago and all they were told was to give her 325mg  of aspirin. Caller wants to know if she has any restrictions. Nurse Assessment Nurse: Izola PriceMyers, RN, Cala BradfordKimberly Date/Time Lamount Cohen(Eastern Time): 08/12/2014 8:34:34 PM Confirm and document reason for call. If symptomatic, describe symptoms. ---Caller states his wife had a stroke 3 weeks ago and all they were told was to give her 325mg  of aspirin. Caller wants to know if she has any restrictions. was taken to ER by daughter due to chest pain. husband was wanting  restrictions when she is taking ASA. Has the patient traveled out of the country within the last 30 days? ---Not Applicable Does the patient require triage? ---No Please document clinical information provided and list any resource used. ---nurse explained , to husbands question as to what she can or can't do, that she can do whatever she feels like but not to overdo. Try not to do anything that could cause problems and be careful when using a knife, walking and that she may have a few more bruises than normal. Nurse explained to call with any more questions or issues. husband stated understanding. Guidelines Guideline Title Affirmed Question Affirmed Notes Nurse Date/Time (Eastern Time) Disp. Time Lamount Cohen(Eastern Time) Disposition Final User 08/12/2014 8:42:33 PM Clinical Call Yes Izola PriceMyers, RN, Cala BradfordKimberly After Care Instructions Given Call Event Type User Date / Time Description PLEASE NOTE: All timestamps contained within this report are represented as [place] Standard Time. CONFIDENTIALTY NOTICE: This fax transmission is intended only for the addressee. It contains information that is legally privileged, confidential or otherwise protected from use or disclosure. If you are not the intended recipient, you are strictly prohibited from reviewing, disclosing, copying using or disseminating any of this information or taking any action in reliance on or regarding this information. If you have received this fax in error, please notify us immediately by telephone so that we can arrange for its return to us. Phone: [phone], Toll-Free: [phone], Fax: [phone] Page: 2 of 2 Call Id: [id number]

## 2014-08-13 NOTE — Telephone Encounter (Signed)
I called and s/w Robin Juarez and made appt for her on Monday 08/17/2014 at 10 w/ Dr Para Marchuncan for a [facility]. Pt was very appreciative. Pt was informed that if she has any questions or problems that we do close at 2, but if after hours, she could call our main line & it would give her after hours instructions. Pt understood.  Thank you!

## 2014-08-13 NOTE — Discharge Instructions (Signed)
Your testing for your heart.  Tonight did not indicate any abnormalities he will need to follow-up with your doctor to arrange a possible outpatient stress test.  Return here for any worsening in your condition.  It is reassuring that it will last for seconds at a time, but obviously cannot 100% exclude a cardiac cause for your chest pain

## 2014-08-13 NOTE — Telephone Encounter (Signed)
Patient notified and verbalized understanding. She did go to the ER last PM for some burning in her chest. They recommended cards appt. Her daughter knew one and made an appt for her next week. She is still waiting on neuro appt.

## 2014-08-13 NOTE — Telephone Encounter (Signed)
Pt needs a follow up appt with Dr. Para Marchuncan if she does not already have one. Continue ASA for now, take it easy.

## 2014-08-13 NOTE — Telephone Encounter (Signed)
Late entry. Called patient last night ~7:30 when I finished clinic.  LMOVM.   Please call her again with the following:  I have called Dr. Wynonia LawmanHejazi and I'm awaiting his call back. I still question if some of her "freezing" sx can be med related.  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 I question if the arm sx are related to that.  I want her to see neuro about both- the arm sx/CVA and the freezing episodes. I put in the referral previously.  If any new neuro sx, then to ER. I would have her try to inc her ASA to 325mg  a day.  Thanks.

## 2014-08-16 NOTE — Telephone Encounter (Signed)
Noted, thanks!

## 2014-08-17 ENCOUNTER — Ambulatory Visit (INDEPENDENT_AMBULATORY_CARE_PROVIDER_SITE_OTHER): Payer: Medicare Other | Admitting: Family Medicine

## 2014-08-17 ENCOUNTER — Encounter: Payer: Self-pay | Admitting: Family Medicine

## 2014-08-17 VITALS — BP 122/76 | HR 84 | Temp 98.5°F | Wt 176.2 lb

## 2014-08-17 DIAGNOSIS — I639 Cerebral infarction, unspecified: Secondary | ICD-10-CM | POA: Diagnosis not present

## 2014-08-17 DIAGNOSIS — R0789 Other chest pain: Secondary | ICD-10-CM

## 2014-08-17 NOTE — Progress Notes (Signed)
Pre visit review using our clinic review tool, if applicable. No additional management support is needed unless otherwise documented below in the visit note.  We talked about her MRI timing and her prev R hand sx.  She likely had a CVA that explained her R hand sx.  That has resolved in the meantime.  Unclear if her leg sx were due to zyprexa. She tried to come off zyprexa but started to feel depressed.  She restarted it and her mood got better.  She hasn't had freezing episodes in the last few days.  No new R hand sx.   She is on the higher dose of the aspirin now.  We talked about seeing neurology for both the CVA and the possible parkinson sx from the zyprexa.   We talked about her issues before with statins and we didn't want to confuse the picture about her leg sx by adding a statin.   She is still fatigued.    Seen recently in ER with CP (she had a burning/stinging in the L chest, just L of midline, that went to the back), none now, none since ER eval.   When she as sx, it was at rest.  No sx with exertion. She wasn't SOB.  No BLE edema.   Has f/u with cards pending, Dr. Allyson SabalBerry in 2 days.   Meds, vitals, and allergies reviewed.   ROS: See HPI.  Otherwise, noncontributory.  GEN: nad, alert and oriented HEENT: mucous membranes moist NECK: supple w/o LA CV: rrr PULM: ctab, no inc wob EXT: no edema CN 2-12 wnl B, S/S/DTR wnl x4, normal cerebellar testing.

## 2014-08-17 NOTE — Patient Instructions (Signed)
Shirlee LimerickMarion or Bonita QuinLinda will call about your referral for Dr. Arbutus Leasat- try to see one of them on the way out.  I'll await the call from Dr. Wynonia LawmanHejazi and the notes from Dr. Allyson SabalBerry.  Take care.  Glad to see you.

## 2014-08-18 DIAGNOSIS — I639 Cerebral infarction, unspecified: Secondary | ICD-10-CM | POA: Insufficient documentation

## 2014-08-18 DIAGNOSIS — R0789 Other chest pain: Secondary | ICD-10-CM | POA: Insufficient documentation

## 2014-08-18 NOTE — Assessment & Plan Note (Signed)
She is already on higher dose of ASA, 325mg . I didn't add a statin given her prev sx.  See discussion re: CVA.  I'll await input from cards. App cards help.

## 2014-08-18 NOTE — Assessment & Plan Note (Signed)
We talked about her MRI timing and her prev R hand sx. She likely had a CVA that explained her R hand sx. That has resolved in the meantime.  Unclear if her leg sx were due to zyprexa. She tried to come off zyprexa but started to feel depressed. She restarted it and her mood got better.  She hasn't had freezing episodes in the last few days. No new R hand sx.  She is on the higher dose of the aspirin now.  We talked about seeing neurology for both the CVA and the possible parkinson sx from the zyprexa.  She would like to proceed.  We talked about her issues before with statins and we didn't want to confuse the picture about her leg sx by adding a statin.  I am awaiting Dr. Wynonia LawmanHejazi to call back.  I would like not to change her psych meds if at all possible, given her sig MDD prev and her good response to meds, but this may need to happen in the future.   >25 minutes spent in face to face time with patient, >50% spent in counselling or coordination of care.

## 2014-08-19 ENCOUNTER — Encounter: Payer: Self-pay | Admitting: Cardiovascular Disease

## 2014-08-19 ENCOUNTER — Ambulatory Visit (INDEPENDENT_AMBULATORY_CARE_PROVIDER_SITE_OTHER): Payer: Medicare Other | Admitting: Cardiovascular Disease

## 2014-08-19 DIAGNOSIS — I639 Cerebral infarction, unspecified: Secondary | ICD-10-CM

## 2014-08-19 DIAGNOSIS — R079 Chest pain, unspecified: Secondary | ICD-10-CM | POA: Diagnosis not present

## 2014-08-19 DIAGNOSIS — I10 Essential (primary) hypertension: Secondary | ICD-10-CM | POA: Insufficient documentation

## 2014-08-19 DIAGNOSIS — Z8673 Personal history of transient ischemic attack (TIA), and cerebral infarction without residual deficits: Secondary | ICD-10-CM

## 2014-08-19 DIAGNOSIS — G459 Transient cerebral ischemic attack, unspecified: Secondary | ICD-10-CM | POA: Diagnosis not present

## 2014-08-19 MED ORDER — METOPROLOL SUCCINATE ER 25 MG PO TB24: 25.0000 mg | ORAL_TABLET | Freq: Every day | ORAL | Status: DC

## 2014-08-19 NOTE — Assessment & Plan Note (Signed)
Patient gives a history of atypical chest pain which is left precordial rate into her back that began 3 weeks ago. It occurs on a daily basis. It does not radiate otherwise nor are there any other associated symptoms. She does have family history of heart disease with a brother who had bypass surgery. I'm going to get a pharmacologic Myoview stress test and 2-D echocardiogram.

## 2014-08-19 NOTE — Progress Notes (Signed)
08/19/2014 Robin KayserMartha J Juarez   [birth year]  [id number]  Primary Physician Robin GivensGraham Duncan, MD Primary Cardiologist: Runell GessJonathan J. Carrin Vannostrand MD Roseanne RenoFACP,FACC,FAHA, FSCAI   HPI:  Miss Robin Juarez is an 80 year old moderately overweight married Caucasian female mother of 4 children, grandmother of [phone] grandchildren who self-referred for evaluation of chest pain. Her primary care physician is Dr. Crawford GivensGraham Robin Juarez. She took up in by one of her daughters Robin Juarez who I know. Her cardiac risk factors are notable for family history with brother does have coronary artery bypass grafting. She probably does have hyperlipidemia and hypertension which is not treated. She began having chest pain 2 weeks ago which is left precordial and radiates to her back. There are no other associated symptoms. She also initially had strokelike symptoms and had an MRI that showed a subacute stroke in the corona radiata.   Current Outpatient Prescriptions  Medication Sig Dispense Refill  . aspirin EC 325 MG tablet Take 1 tablet (325 mg total) by mouth daily.    . cholecalciferol (VITAMIN D) 1000 UNITS tablet Take 1,000 Units by mouth daily.      . DULoxetine (CYMBALTA) 60 MG capsule Take 60 mg by mouth every morning.     . folic acid (FOLVITE) 800 MCG tablet Take 800 mcg by mouth daily.     [name]. gabapentin (NEURONTIN) 300 MG capsule Take 300 mg by mouth 3 (three) times daily.     [name]. KRILL OIL OMEGA-3 PO Take 1 capsule by mouth daily.    [name]. lamoTRIgine (LAMICTAL) 100 MG tablet Take 100 mg by mouth at bedtime.    [name]. OLANZapine (ZYPREXA) 2.5 MG tablet Take 1.25 mg by mouth at bedtime.     . metoprolol succinate (TOPROL XL) 25 MG 24 hr tablet Take 1 tablet (25 mg total) by mouth daily. 30 tablet 6   No current facility-administered medications for this visit.    Allergies  Allergen Reactions  . Digoxin Other (See Comments)    Felt weak, tired, "aweful"  . Nitrofurantoin Other (See Comments)    Sharp pain back of head  .  Alendronate Sodium Other (See Comments)    cramps  . Hydrocodone-Acetaminophen Other (See Comments)    : Bad effect with other meds.  . Simvastatin Other (See Comments)    Myalgia, possible intolerance  . Sulfonamide Derivatives Other (See Comments)    REACTION: (Maybe)-neck pain-brief  . Augmentin [Amoxicillin-Pot Clavulanate] Rash    In mouth  . Cefaclor Other (See Comments)    Felt bad  . Cephalexin Other (See Comments)    Felt bad, but able to tolerate amoxil prev  . Chlordiazepoxide Other (See Comments)    REACTION: (Maybe)  . Clavulanic Acid Other (See Comments)    Not an allergy.  Tolerates plain amoxil but not augmentin- diarrhea, etc with augmentin  . Omeprazole Rash    rash  . Oxcarbazepine Other (See Comments)    unknown  . Pb-Hyoscy-Atropine-Scopolamine Other (See Comments)    Unsure-doesn't remember  . Streptomycin Other (See Comments)    Lips itched  . Tetracycline Itching    itching  . Zetia [Ezetimibe] Other (See Comments)    possible cause of aches and abnormal dreams, stopped 05/2014    History   Social History  . Marital Status: Married    Spouse Name: N/A    Number of Children: 4  . Years of Education: N/A   Occupational History  . RETIRED    Social History Main Topics  .  Smoking status: Never Smoker   . Smokeless tobacco: Never Used  . Alcohol Use: No  . Drug Use: No  . Sexual Activity: Not Currently   Other Topics Concern  . Not on file   Social History Narrative   Married 1953, husband is well.   5 pregnancies, 4 live births, all well (5th was unexpect pregnancy and patient needed to terminate due to concurrent medical problems at the time)   11 grandchildren.  3 great grandchildren   Regular exercise:  No   Caffeine:  Yes, coffee, tea, 2 servings daily.   Diet:  Yes, low fat, low sugar     Review of Systems: General: negative for chills, fever, night sweats or weight changes.  Cardiovascular: negative for chest pain, dyspnea on  exertion, edema, orthopnea, palpitations, paroxysmal nocturnal dyspnea or shortness of breath Dermatological: negative for rash Respiratory: negative for cough or wheezing Urologic: negative for hematuria Abdominal: negative for nausea, vomiting, diarrhea, bright red blood per rectum, melena, or hematemesis Neurologic: negative for visual changes, syncope, or dizziness All other systems reviewed and are otherwise negative except as noted above.    Blood pressure 140/86, pulse 84, height 5\' 1"  (1.549 m), weight 175 lb 12.8 oz (79.742 kg).  General appearance: alert and no distress Neck: no adenopathy, no carotid bruit, no JVD, supple, symmetrical, trachea midline and thyroid not enlarged, symmetric, no tenderness/mass/nodules Lungs: clear to auscultation bilaterally Heart: regular rate and rhythm, S1, S2 normal, no murmur, click, rub or gallop Extremities: extremities normal, atraumatic, no cyanosis or edema  EKG not performed today  ASSESSMENT AND PLAN:   Atypical chest pain Patient gives a history of atypical chest pain which is left precordial rate into her back that began 3 weeks ago. It occurs on a daily basis. It does not radiate otherwise nor are there any other associated symptoms. She does have family history of heart disease with a brother who had bypass surgery. I'm going to get a pharmacologic Myoview stress test and 2-D echocardiogram.  Essential hypertension Patient gives a history of a recent stroke like symptoms. MRI showed subacute to chronic stroke in the corona radiata. She does have a pretty complete blood pressure log book with spikes of hypertension and diastolic hypertension. I'm going to begin a low-dose beta blocker.      Runell GessJonathan J. Imelda Dandridge MD FACP,FACC,FAHA, [facility] Inc[facility]SCAI 08/19/2014 11:57 AM

## 2014-08-19 NOTE — Patient Instructions (Signed)
Please follow up with Dr. Allyson SabalBerry after having the following studies completed  Echocardiogram. Echocardiography is a painless test that uses sound waves to create images of your heart. It provides your doctor with information about the size and shape of your heart and how well your heart's chambers and valves are working. This procedure takes approximately one hour. There are no restrictions for this procedure.  Lexiscan Myoview- this is a test that looks at the blood flow to your heart muscle.  It takes approximately 2 1/2 hours. Please follow instruction sheet, as given.  START taking Toprol XL 25 mg.  Your Doctor has ordered you to wear a heart monitor. You will wear this for 14 days.   TIPS -  REMINDERS 1. The sensor is the lanyard that is worn around your neck every day - this is powered by a battery that needs to be changed every day 2. The monitor is the device that allows you to record symptoms - this will need to be charged daily 3. The sensor & monitor need to be within 100 feet of each other at all times 4. The sensor connects to the electrodes (stickers) - these should be changed every 24-48 hours (you do not have to remove them when you bathe, just make sure they are dry when you connect it back to the sensor 5. If you need more supplies (electrodes, batteries), please call the 1-800 # on the back of the pamphlet and [employer] will mail you more supplies 6. If your skin becomes sensitive, please try the sample pack of sensitive skin electrodes (the white packet in your silver box) and call [employer] to have them mail you more of these type of electrodes 7. When you are finish wearing the monitor, please place all supplies back in the silver box, place the silver box in the pre-packaged UPS bag and drop off at [employer] or call them so they can come pick it up   Cardiac Event Monitoring A cardiac event monitor is a small recording device used to help detect abnormal heart rhythms  (arrhythmias). The monitor is used to record heart rhythm when noticeable symptoms such as the following occur:  Fast heartbeats (palpitations), such as heart racing or fluttering.  Dizziness.  Fainting or light-headedness.  Unexplained weakness. The monitor is wired to two electrodes placed on your chest. Electrodes are flat, sticky disks that attach to your skin. The monitor can be worn for up to 30 days. You will wear the monitor at all times, except when bathing.  HOW TO USE YOUR CARDIAC EVENT MONITOR A technician will prepare your chest for the electrode placement. The technician will show you how to place the electrodes, how to work the monitor, and how to replace the batteries. Take time to practice using the monitor before you leave the office. Make sure you understand how to send the information from the monitor to your health care provider. This requires a telephone with a landline, not a cell phone. You need to:  Wear your monitor at all times, except when you are in water:  Do not get the monitor wet.  Take the monitor off when bathing. Do not swim or use a hot tub with it on.  Keep your skin clean. Do not put body lotion or moisturizer on your chest.  Change the electrodes daily or any time they stop sticking to your skin. You might need to use tape to keep them on.  It is possible that your  skin under the electrodes could become irritated. To keep this from happening, try to put the electrodes in slightly different places on your chest. However, they must remain in the area under your left breast and in the upper right section of your chest.  Make sure the monitor is safely clipped to your clothing or in a location close to your body that your health care provider recommends.  Press the button to record when you feel symptoms of heart trouble, such as dizziness, weakness, light-headedness, palpitations, thumping, shortness of breath, unexplained weakness, or a fluttering or  racing heart. The monitor is always on and records what happened slightly before you pressed the button, so do not worry about being too late to get good information.  Keep a diary of your activities, such as walking, doing chores, and taking medicine. It is especially important to note what you were doing when you pushed the button to record your symptoms. This will help your health care provider determine what might be contributing to your symptoms. The information stored in your monitor will be reviewed by your health care provider alongside your diary entries.  Send the recorded information as recommended by your health care provider. It is important to understand that it will take some time for your health care provider to process the results.  Change the batteries as recommended by your health care provider. SEEK IMMEDIATE MEDICAL CARE IF:   You have chest pain.  You have extreme difficulty breathing or shortness of breath.  You develop a very fast heartbeat that persists.  You develop dizziness that does not go away.  You faint or constantly feel you are about to faint. Document Released: 05/16/2008 Document Revised: 12/22/2013 Document Reviewed: 02/03/2013 [facility] Patient Information 2015 [employer] [place]. This information is not intended to replace advice given to you by your health care provider. Make sure you discuss any questions you have with your health care provider.

## 2014-08-19 NOTE — Assessment & Plan Note (Signed)
Patient gives a history of a recent stroke like symptoms. MRI showed subacute to chronic stroke in the corona radiata. She does have a pretty complete blood pressure log book with spikes of hypertension and diastolic hypertension. I'm going to begin a low-dose beta blocker.

## 2014-08-20 ENCOUNTER — Ambulatory Visit (INDEPENDENT_AMBULATORY_CARE_PROVIDER_SITE_OTHER): Payer: Medicare Other | Admitting: Neurology

## 2014-08-20 ENCOUNTER — Telehealth (HOSPITAL_COMMUNITY): Payer: Self-pay

## 2014-08-20 ENCOUNTER — Encounter: Payer: Self-pay | Admitting: Neurology

## 2014-08-20 ENCOUNTER — Telehealth: Payer: Self-pay | Admitting: Family Medicine

## 2014-08-20 VITALS — BP 126/84 | HR 80 | Ht 61.0 in | Wt 174.0 lb

## 2014-08-20 DIAGNOSIS — R9402 Abnormal brain scan: Secondary | ICD-10-CM

## 2014-08-20 DIAGNOSIS — R251 Tremor, unspecified: Secondary | ICD-10-CM | POA: Diagnosis not present

## 2014-08-20 DIAGNOSIS — I639 Cerebral infarction, unspecified: Secondary | ICD-10-CM

## 2014-08-20 MED ORDER — CLONAZEPAM 0.5 MG PO TABS: 0.5000 mg | ORAL_TABLET | Freq: Two times a day (BID) | ORAL | Status: DC | PRN

## 2014-08-20 MED ORDER — CLOPIDOGREL BISULFATE 75 MG PO TABS: 75.0000 mg | ORAL_TABLET | Freq: Every day | ORAL | Status: DC

## 2014-08-20 NOTE — Telephone Encounter (Signed)
Encounter complete. 

## 2014-08-20 NOTE — Telephone Encounter (Signed)
Call from Dr. Wynonia LawmanHejazi yesterday, this is a late entry.  He is going to taper patient off zyprexa and has started klonopin 0.5mg  bid in the meantime.  I'll await neuro input.   I thank all involved.

## 2014-08-20 NOTE — Patient Instructions (Signed)
1. Stop Aspirin. Start Plavix 75 mg. Prescription sent to your pharmacy. Call with any questions.

## 2014-08-20 NOTE — Progress Notes (Signed)
Robin FieldMartha J Juarez was seen today in the movement disorders clinic for neurologic consultation at the request of Crawford GivensGraham Duncan, MD.   Prior records that were made available to me were reviewed.  She is accompanied by her daughter in law who supplements the history.    She states that she is here to talk about "stroke."  She states that she had LE weakness that started 4 weeks ago.  It started after she lifted something too heavy and she thought that the weakness was coming from her low back as she was having back pain.  If she sat, the legs would be really weak but she is clear that it was both legs.  Not long thereafter she noted difficulty with writing with the R hand.  No speech changes.  No paresthesias.  About 1 week later, she had a feeling inside of her like her muscles inside of her were freezing.  She had no trouble walking except that she was weak (does not describing freezing of gait).  States that the "freezing" was just a feeling on the inside like her lungs and inside were freezing momentarily.  Had 2 separate episodes.  Has had some tremor in the L arm for several years that she notes when holding up the L hand to do her hair.  She subsequently saw Dr. Para Marchuncan who ordered an MRI of the brain.  I had the opportunity to review this and reviewed the images with pt and her family as well.  This was done without gadolinium.  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  When I looked at this myself it looked like potentially chronic T2 shine through.  There was at least moderate small vessel disease.  She subsequently had a carotid ultrasound on 08/07/2014 that showed 1-39% stenosis bilaterally.  She is wearing a cardiac monitor today.  Her cholesterol was last checked on 05/07/2014 and her total cholesterol was 207 with an HDL of 54 and an LDL of 132.  She has been on statin medications in the past and has not been able to tolerate it so is  currently just on krill oil.  Pt states that she was on zocor and she had myalgia and it was attributed to the zocor but she has the same myalgia even after the zocor was stopped so she doesn't think that it was the zocor looking back.  She has tried zetia but it caused vivid dreams.  Pt states that the above sx's have now resolved.  After the results of the MRI came back her 81 mg aspirin was increased to 325 mg   ALLERGIES:   Allergies  Allergen Reactions  . Digoxin Other (See Comments)    Felt weak, tired, "aweful"  . Nitrofurantoin Other (See Comments)    Sharp pain back of head  . Alendronate Sodium Other (See Comments)    cramps  . Hydrocodone-Acetaminophen Other (See Comments)    : Bad effect with other meds.  . Simvastatin Other (See Comments)    Myalgia, possible intolerance  . Sulfonamide Derivatives Other (See Comments)    REACTION: (Maybe)-neck pain-brief  . Augmentin [Amoxicillin-Pot Clavulanate] Rash    In mouth  . Cefaclor Other (See Comments)    Felt bad  . Cephalexin Other (See Comments)    Felt bad, but able to tolerate amoxil prev  . Chlordiazepoxide Other (See Comments)    REACTION: (Maybe)  . Clavulanic Acid  Other (See Comments)    Not an allergy.  Tolerates plain amoxil but not augmentin- diarrhea, etc with augmentin  . Omeprazole Rash    rash  . Oxcarbazepine Other (See Comments)    unknown  . Pb-Hyoscy-Atropine-Scopolamine Other (See Comments)    Unsure-doesn't remember  . Streptomycin Other (See Comments)    Lips itched  . Tetracycline Itching    itching  . Zetia [Ezetimibe] Other (See Comments)    possible cause of aches and abnormal dreams, stopped 05/2014    CURRENT MEDICATIONS:  Outpatient Encounter Prescriptions as of 08/20/2014  Medication Sig  . aspirin EC 325 MG tablet Take 1 tablet (325 mg total) by mouth daily.  . cholecalciferol (VITAMIN D) 1000 UNITS tablet Take 1,000 Units by mouth daily.    . DULoxetine (CYMBALTA) 60 MG capsule  Take 60 mg by mouth every morning.   . folic acid (FOLVITE) 800 MCG tablet Take 800 mcg by mouth daily.   [name] gabapentin (NEURONTIN) 300 MG capsule Take 300 mg by mouth 3 (three) times daily.   [name] KRILL OIL OMEGA-3 PO Take 1 capsule by mouth daily.  [name] lamoTRIgine (LAMICTAL) 100 MG tablet Take 100 mg by mouth at bedtime.  [name] OLANZapine (ZYPREXA) 2.5 MG tablet Take 2.5 mg by mouth at bedtime.  . clonazePAM (KLONOPIN) 0.5 MG tablet Take 1 tablet (0.5 mg total) by mouth 2 (two) times daily as needed for anxiety. (Patient not taking: Reported on 08/20/2014)  . metoprolol succinate (TOPROL XL) 25 MG 24 hr tablet Take 1 tablet (25 mg total) by mouth daily. (Patient not taking: Reported on 08/20/2014)    PAST MEDICAL HISTORY:   Past Medical History  Diagnosis Date  . Hyperlipidemia   . GERD (gastroesophageal reflux disease)   . Osteoporosis   . Arthritis   . Fibromyalgia   . UTI (lower urinary tract infection)   . Urinary incontinence   . Depression 1993, 2000  . Blood transfusion without reported diagnosis     37 yrs ago -s/p back surgery  . OSA (obstructive sleep apnea)     dx 2010-had a cpap-does not use-"uses special mouthpiece now"  . H/O hiatal hernia   . Stroke   . Hypertension   . Chest pain     PAST SURGICAL HISTORY:   Past Surgical History  Procedure Laterality Date  . Cardiovascular stress test  2009    Normal, Dr. Mayford Knife  . Breast biopsy  1995    x 2  . Abdominal hysterectomy  1976  . Back fusions  1977    missing vertebra  . Childbirth  [phone]  . Ovarian cyst removal  1964  . Appendectomy  1964  . Dilatation and currettage  1964  . Cataract extraction, bilateral Bilateral 2005  . Breast biopsy  07/10/2012    Procedure: BREAST BIOPSY WITH NEEDLE LOCALIZATION;  Surgeon: Emelia Loron, MD;  Location: [facility];  Service: General;  Laterality: Right;  . Cystoscopy N/A 11/25/2012    Procedure: CYSTOSCOPY;  Surgeon: Martina Sinner, MD;   Location: [facility];  Service: Urology;  Laterality: N/A;  . Pubovaginal sling N/A 11/25/2012    Procedure: Leonides Grills;  Surgeon: Martina Sinner, MD;  Location: [facility];  Service: Urology;  Laterality: N/A;  . Back surgery      fusions lumbar area  . Lumbar laminectomy/decompression microdiscectomy N/A 05/01/2013    Procedure: COMPLETE DECOMPRESSIVE LUMBAR LAMINECTOMY L3-L4 MICRODISCECTOMY L3-L4 ON THE LEFT;  Surgeon:  Jacki Conesonald A Gioffre, MD;  Location: [facility];  Service: Orthopedics;  Laterality: N/A;    SOCIAL HISTORY:   History   Social History  . Marital Status: Married    Spouse Name: N/A    Number of Children: 4  . Years of Education: N/A   Occupational History  . RETIRED    Social History Main Topics  . Smoking status: Never Smoker   . Smokeless tobacco: Never Used     Comment: a small amount when 80 yo  . Alcohol Use: No  . Drug Use: No  . Sexual Activity: Not Currently   Other Topics Concern  . Not on file   Social History Narrative   Married 1953, husband is well.   5 pregnancies, 4 live births, all well (5th was unexpect pregnancy and patient needed to terminate due to concurrent medical problems at the time)   11 grandchildren.  3 great grandchildren   Regular exercise:  No   Caffeine:  Yes, coffee, tea, 2 servings daily.   Diet:  Yes, low fat, low sugar    FAMILY HISTORY:   Family Status  Relation Status Death Age  . Mother Deceased     Old age  . Father Deceased 5444    stroke  . Brother Deceased     during CABG  . Sister Alive     PD  . Sister Alive     Dementia  . Other Alive   . Sister Deceased     Home accident  . Brother Deceased     MVA  . Son Alive     healthy  . Son Alive     healthy  . Son Alive     healthy  . Daughter Alive     healthy    ROS:  A complete 10 system review of systems was obtained and was unremarkable apart from what is mentioned above.  PHYSICAL EXAMINATION:     VITALS:   Filed Vitals:   08/20/14 0937  BP: 126/84  Pulse: 80  Height: 5\' 1"  (1.549 m)  Weight: 174 lb (78.926 kg)    GEN:  The patient appears younger than stated age and is in NAD. HEENT:  Normocephalic, atraumatic.  The mucous membranes are moist. The superficial temporal arteries are without ropiness or tenderness. CV:  RRR Lungs:  CTAB Neck/HEME:  There are no carotid bruits bilaterally.  Neurological examination:  Orientation: The patient is alert and oriented x3. Fund of knowledge is appropriate.  Recent and remote memory are intact.  Attention and concentration are normal.    Able to name objects and repeat phrases. Cranial nerves: There is good facial symmetry with the exception of asymmetric blink (chronic per patient and daughter in law). Pupils are equal round and reactive to light bilaterally. Fundoscopic exam reveals clear margins bilaterally. Extraocular muscles are intact. The visual fields are full to confrontational testing. The speech is fluent and clear. Soft palate rises symmetrically and there is no tongue deviation. Hearing is intact to conversational tone. Sensation: Sensation is intact to light and pinprick throughout (facial, trunk, extremities). Vibration is intact at the bilateral big toe. There is no extinction with double simultaneous stimulation. There is no sensory dermatomal level identified. Motor: Strength is 5/5 in the bilateral upper and lower extremities.   Shoulder shrug is equal and symmetric.  There is no pronator drift. Deep tendon reflexes: Deep tendon reflexes are 1/4 at the bilateral biceps, triceps, brachioradialis, patella and absent at the  bilateral achilles. Plantar responses are downgoing bilaterally.  Movement examination: Tone: There is normal tone in the bilateral upper extremities.  The tone in the lower extremities is normal.  Abnormal movements: none Coordination:  There is no decremation with RAM's, seen with any form of RAM's,  including alternating supination and pronation of the forearm, hand opening and closing, finger taps, heel taps and toe taps. Gait and Station: The patient has no difficulty arising out of a deep-seated chair without the use of the hands. The patient's stride length is normal.    ASSESSMENT/PLAN:  1.  Abnormal brain scan.  -While I am not completely convinced either based on history or based on the brain scan that this was a new cerebral infarct (think that this could just represent chronic T2 shine through), I agree with Dr. Para March that we should probably treat it as such.  He has already completed her carotid ultrasound.  I am going to go ahead and discontinue the aspirin and change it to Plavix as there is no good evidence that higher dose aspirin is better for stroke prevention than low-dose aspirin.  I did talk to her extensively about the risks/benefits/side effects of Plavix.  Understanding was expressed.  She will let her providers know about any melena or her medication.  She understands risk of intracerebral bleed with falls.  Risks, benefits, side effects and alternative therapies were discussed.  The opportunity to ask questions was given and they were answered to the best of my ability.  The patient expressed understanding and willingness to follow the outlined treatment protocols.  -Talked to her about statin therapy for secondary stroke prevention.  She is going to talk this over with her primary care physician.  She has been on statins before.  She is not convinced now that she had side effects with them, although there is a question of whether or not she perhaps had some myalgias. 2.  I saw no evidence of parkinsonism from her Zyprexa, but she is working on getting off of it anyway.  The freezing that she described to me today did not sound like a freezing from Zyprexa. 3.  She will follow-up with me on an as-needed basis.  Greater than 50% of the visit was spent in counseling discussing  stroke risk factors and prevention.  Total visit time was 60 minutes.

## 2014-08-26 ENCOUNTER — Ambulatory Visit (HOSPITAL_COMMUNITY)
Admission: RE | Admit: 2014-08-26 | Discharge: 2014-08-26 | Disposition: A | Discharge status: Home / Self Care | Payer: Medicare Other | Source: Ambulatory Visit | Attending: Internal Medicine | Admitting: Internal Medicine

## 2014-08-26 ENCOUNTER — Ambulatory Visit (HOSPITAL_BASED_OUTPATIENT_CLINIC_OR_DEPARTMENT_OTHER)
Admission: RE | Admit: 2014-08-26 | Discharge: 2014-08-26 | Disposition: A | Discharge status: Home / Self Care | Payer: Medicare Other | Source: Ambulatory Visit | Attending: Internal Medicine | Admitting: Internal Medicine

## 2014-08-26 ENCOUNTER — Telehealth: Payer: Self-pay | Admitting: Cardiovascular Disease

## 2014-08-26 DIAGNOSIS — Z8249 Family history of ischemic heart disease and other diseases of the circulatory system: Secondary | ICD-10-CM | POA: Insufficient documentation

## 2014-08-26 DIAGNOSIS — I10 Essential (primary) hypertension: Secondary | ICD-10-CM | POA: Diagnosis not present

## 2014-08-26 DIAGNOSIS — R0609 Other forms of dyspnea: Secondary | ICD-10-CM | POA: Diagnosis not present

## 2014-08-26 DIAGNOSIS — R079 Chest pain, unspecified: Secondary | ICD-10-CM | POA: Diagnosis present

## 2014-08-26 DIAGNOSIS — E785 Hyperlipidemia, unspecified: Secondary | ICD-10-CM | POA: Diagnosis not present

## 2014-08-26 DIAGNOSIS — R5383 Other fatigue: Secondary | ICD-10-CM | POA: Insufficient documentation

## 2014-08-26 MED ORDER — TECHNETIUM TC 99M SESTAMIBI GENERIC - CARDIOLITE
32.4000 | Freq: Once | INTRAVENOUS | Status: AC | PRN
  Administered 2014-08-26: 32 via INTRAVENOUS

## 2014-08-26 MED ORDER — AMINOPHYLLINE 25 MG/ML IV SOLN
75.0000 mg | Freq: Once | INTRAVENOUS | Status: AC
  Administered 2014-08-26: 75 mg via INTRAVENOUS

## 2014-08-26 MED ORDER — TECHNETIUM TC 99M SESTAMIBI GENERIC - CARDIOLITE
10.6000 | Freq: Once | INTRAVENOUS | Status: AC | PRN
  Administered 2014-08-26: 11 via INTRAVENOUS

## 2014-08-26 MED ORDER — REGADENOSON 0.4 MG/5ML IV SOLN
0.4000 mg | Freq: Once | INTRAVENOUS | Status: AC
  Administered 2014-08-26: 0.4 mg via INTRAVENOUS

## 2014-08-26 NOTE — Progress Notes (Signed)
2D Echocardiogram Complete.  08/26/2014   Davionne Dowty, RDCS  

## 2014-08-26 NOTE — Procedures (Addendum)
[facility] [address] [place] [address] [phone]  Cardiology Nuclear Med Study  Robin Juarez is a 81 y.o. female     MRN : [id number]     DOB: 1930/10/15  Procedure Date: 08/26/2014  Nuclear Med Background Indication for Stress Test:  Evaluation for Ischemia and Post Hospital History:  No prior cardiac or respiratory history reported; Pt states she has had NUC MPI in the past but can't remember a date or place of test. Cardiac Risk Factors: CVA, Family History - CAD, Hypertension, Lipids and Overweight  Symptoms:  Chest Pain, DOE and Fatigue   Nuclear Pre-Procedure Caffeine/Decaff Intake:  7:00pm NPO After: 5:00am   IV Site: R Forearm  IV 0.9% NS with Angio Cath:  22g  Chest Size (in):  n/a IV Started by: Berdie OgrenAmanda Wease, RN  Height: 5\' 1"  (1.549 m)  Cup Size: C  BMI:  Body mass index is 33.08 kg/(m^2). Weight:  175 lb (79.379 kg)   Tech Comments:  n/a    Nuclear Med Study 1 or 2 day study: 1 day  Stress Test Type:  Lexiscan  Order Authorizing Provider:  Nanetta BattyJonathan Eron Staat, MD   Resting Radionuclide: Technetium 362m Sestamibi  Resting Radionuclide Dose: 10.6 mCi   Stress Radionuclide:  Technetium 8662m Sestamibi  Stress Radionuclide Dose: 32.4 mCi           Stress Protocol Rest HR: 71 Stress HR: 84  Rest BP: 128/96 Stress BP: 167/81  Exercise Time (min): n/a METS: n/a   Predicted Max HR: 139 bpm % Max HR: 64.75 bpm Rate Pressure Product: [id number]  Dose of Adenosine (mg):  n/a Dose of Lexiscan: 0.4 mg  Dose of Atropine (mg): n/a Dose of Dobutamine: n/a mcg/kg/min (at max HR)  Stress Test Technologist: Esperanza Sheetserry-Marie Martin, CCT Nuclear Technologist: Gonzella LexPam Phillips, CNMT   Rest Procedure:  Myocardial perfusion imaging was performed at rest 45 minutes following the intravenous administration of Technetium 6062m Sestamibi. Stress Procedure:  The patient received IV Lexiscan 0.4 mg over 15-seconds.  Technetium 7562m  Sestamibi injected IV at 30-seconds.  Patient experienced SOB and Bilateral Arm aching 5-6/10 and 75 mg Aminophylline IV was administered.There were no significant changes with Lexiscan.  Quantitative spect images were obtained after a 45 minute delay.  Transient Ischemic Dilatation (Normal <1.22):  1.21  QGS EDV:  52 ml QGS ESV:  18 ml LV Ejection Fraction: 65%       Rest ECG: NSR - Normal EKG  Stress ECG: No significant change from baseline ECG  QPS Raw Data Images:  Normal; no motion artifact; normal heart/lung ratio. Stress Images:  There is decreased uptake in the anterior wall. Rest Images:  Normal homogeneous uptake in all areas of the myocardium. Subtraction (SDS):  These findings are consistent with ischemia.  Impression Exercise Capacity:  Lexiscan with no exercise. BP Response:  Normal blood pressure response. Clinical Symptoms:  No significant symptoms noted. ECG Impression:  No significant ST segment change suggestive of ischemia. Comparison with Prior Nuclear Study: No images to compare  Overall Impression:  Low risk stress nuclear study Mild anteroapical ischemia. ROV with Dr. Allyson SabalBerry.  LV Wall Motion:  NL LV Function; NL Wall Motion   Runell GessBERRY,Keimon Basaldua J, MD  08/26/2014 12:44 PM

## 2014-08-27 NOTE — Telephone Encounter (Signed)
Closed encounter °

## 2014-08-31 ENCOUNTER — Encounter: Payer: Self-pay | Admitting: Cardiovascular Disease

## 2014-08-31 ENCOUNTER — Other Ambulatory Visit: Payer: Self-pay | Admitting: Cardiovascular Disease

## 2014-08-31 ENCOUNTER — Ambulatory Visit (INDEPENDENT_AMBULATORY_CARE_PROVIDER_SITE_OTHER): Payer: Medicare Other | Admitting: Cardiovascular Disease

## 2014-08-31 VITALS — BP 137/75 | HR 87 | Ht 61.0 in | Wt 175.2 lb

## 2014-08-31 DIAGNOSIS — R0789 Other chest pain: Secondary | ICD-10-CM

## 2014-08-31 DIAGNOSIS — R5383 Other fatigue: Secondary | ICD-10-CM | POA: Diagnosis not present

## 2014-08-31 DIAGNOSIS — I10 Essential (primary) hypertension: Secondary | ICD-10-CM | POA: Diagnosis not present

## 2014-08-31 DIAGNOSIS — Z01818 Encounter for other preprocedural examination: Secondary | ICD-10-CM | POA: Diagnosis not present

## 2014-08-31 DIAGNOSIS — R9439 Abnormal result of other cardiovascular function study: Secondary | ICD-10-CM | POA: Diagnosis not present

## 2014-08-31 DIAGNOSIS — D689 Coagulation defect, unspecified: Secondary | ICD-10-CM

## 2014-08-31 NOTE — Assessment & Plan Note (Signed)
History of hyperlipidemia currently not on a statin drug. Her last lipid profile performed 05/07/14 revealed a total cholesterol 207, LDL 132 and HDL of 54

## 2014-08-31 NOTE — Assessment & Plan Note (Signed)
History of hypertension with blood pressure measured at 137/75. She is on metoprolol. Continue current meds at current dosing

## 2014-08-31 NOTE — Progress Notes (Signed)
Mrs. Dante GangWhitesell returns for follow-up of her Myoview stress test. This revealed mild anteroapical ischemia. Her 2-D echo was normal. Based on her new onset symptoms and positive risk factors I'm going to proceed with outpatient cardiac catheterization via the right radial approach.  Runell GessJonathan J. Lyman Balingit, M.D., FACP, [facility]ACC, Earl LagosFAHA, [facility]SC[facility] [address] [place], [place]  [address]  [phone] 08/31/2014 4:00 PM

## 2014-08-31 NOTE — Patient Instructions (Addendum)
Your physician has requested that you have a cardiac catheterization (LHC-Right Radial). Cardiac catheterization is used to diagnose and/or treat various heart conditions. Doctors may recommend this procedure for a number of different reasons. The most common reason is to evaluate chest pain. Chest pain can be a symptom of coronary artery disease (CAD), and cardiac catheterization can show whether plaque is narrowing or blocking your heart's arteries. This procedure is also used to evaluate the valves, as well as measure the blood flow and oxygen levels in different parts of your heart. For further information please visit [email or web address].   Following your catheterization, you will not be allowed to drive for 3 days.  No lifting, pushing, or pulling greater that 10 pounds is allowed for 1 week.  You will be required to have the following tests prior to the procedure:  1. Blood work-the blood work can be done no more than 7 days prior to the procedure.  It can be done at any [facility] lab.  There is one downstairs on the first floor of this building and [facility] ([address])

## 2014-08-31 NOTE — Assessment & Plan Note (Signed)
The patient had a Myoview stress test that showed subtle anteroapical ischemia. Given the new onset chest pain and positive risk factors I decided to proceed with outpatient cardiac catheterization via the right radial approach. I thoroughly discussed the risks and benefits of the patient and her husband and they agree to proceed.

## 2014-09-01 ENCOUNTER — Ambulatory Visit: Payer: Medicare Other | Admitting: Family Medicine

## 2014-09-01 DIAGNOSIS — G459 Transient cerebral ischemic attack, unspecified: Secondary | ICD-10-CM | POA: Diagnosis not present

## 2014-09-01 LAB — BASIC METABOLIC PANEL
BUN: 13 mg/dL (ref 6–23)
CHLORIDE: 103 meq/L (ref 96–112)
CO2: 31 meq/L (ref 19–32)
CREATININE: 0.81 mg/dL (ref 0.50–1.10)
Calcium: 9.7 mg/dL (ref 8.4–10.5)
GLUCOSE: 90 mg/dL (ref 70–99)
Potassium: 4.2 mEq/L (ref 3.5–5.3)
Sodium: 141 mEq/L (ref 135–145)

## 2014-09-01 LAB — TSH: TSH: 3.352 u[IU]/mL (ref 0.350–4.500)

## 2014-09-01 LAB — CBC
HCT: 41.6 % (ref 36.0–46.0)
Hemoglobin: 14.8 g/dL (ref 12.0–15.0)
MCH: 33.2 pg (ref 26.0–34.0)
MCHC: 35.6 g/dL (ref 30.0–36.0)
MCV: 93.3 fL (ref 78.0–100.0)
MPV: 10.3 fL (ref 8.6–12.4)
PLATELETS: 258 10*3/uL (ref 150–400)
RBC: 4.46 MIL/uL (ref 3.87–5.11)
RDW: 13.6 % (ref 11.5–15.5)
WBC: 8.8 10*3/uL (ref 4.0–10.5)

## 2014-09-01 LAB — PROTIME-INR
INR: 0.97 (ref <1.50)
PROTHROMBIN TIME: 12.9 s (ref 11.6–15.2)

## 2014-09-01 LAB — APTT: APTT: 36 s (ref 24–37)

## 2014-09-02 ENCOUNTER — Telehealth: Payer: Self-pay | Admitting: Cardiovascular Disease

## 2014-09-02 NOTE — Telephone Encounter (Signed)
Pt called in stating that she will be going to the hospital tomorrow for a catheterization and she wanted to know if she should take her morning meds prior to pre admissions. Please call  Thanks

## 2014-09-02 NOTE — Telephone Encounter (Signed)
Not a problem.

## 2014-09-02 NOTE — Telephone Encounter (Signed)
Pt informed

## 2014-09-02 NOTE — Telephone Encounter (Signed)
Pt takes cymbalta & neurontin in AM. She wanted to make sure these were fine to take prior to arriving for her cath. Informed her I don't think it should be a problem, will route to Dr. Allyson SabalBerry to confirm.

## 2014-09-03 ENCOUNTER — Ambulatory Visit (HOSPITAL_COMMUNITY)
Admission: RE | Admit: 2014-09-03 | Discharge: 2014-09-03 | Disposition: A | Discharge status: Home / Self Care | Payer: Medicare Other | Source: Ambulatory Visit | Attending: Cardiovascular Disease | Admitting: Cardiovascular Disease

## 2014-09-03 ENCOUNTER — Encounter: Payer: Self-pay | Admitting: *Deleted

## 2014-09-03 ENCOUNTER — Encounter (HOSPITAL_COMMUNITY)
Admission: RE | Disposition: A | Discharge status: Home / Self Care | Payer: Self-pay | Source: Ambulatory Visit | Attending: Cardiovascular Disease

## 2014-09-03 DIAGNOSIS — D689 Coagulation defect, unspecified: Secondary | ICD-10-CM

## 2014-09-03 DIAGNOSIS — R0789 Other chest pain: Secondary | ICD-10-CM | POA: Diagnosis not present

## 2014-09-03 DIAGNOSIS — Z7982 Long term (current) use of aspirin: Secondary | ICD-10-CM | POA: Diagnosis not present

## 2014-09-03 DIAGNOSIS — E663 Overweight: Secondary | ICD-10-CM | POA: Diagnosis not present

## 2014-09-03 DIAGNOSIS — Z6832 Body mass index (BMI) 32.0-32.9, adult: Secondary | ICD-10-CM | POA: Diagnosis not present

## 2014-09-03 DIAGNOSIS — I10 Essential (primary) hypertension: Secondary | ICD-10-CM | POA: Insufficient documentation

## 2014-09-03 DIAGNOSIS — Z8249 Family history of ischemic heart disease and other diseases of the circulatory system: Secondary | ICD-10-CM | POA: Diagnosis not present

## 2014-09-03 DIAGNOSIS — Z01818 Encounter for other preprocedural examination: Secondary | ICD-10-CM

## 2014-09-03 DIAGNOSIS — I2510 Atherosclerotic heart disease of native coronary artery without angina pectoris: Secondary | ICD-10-CM | POA: Diagnosis not present

## 2014-09-03 DIAGNOSIS — Z79899 Other long term (current) drug therapy: Secondary | ICD-10-CM | POA: Diagnosis not present

## 2014-09-03 DIAGNOSIS — R9439 Abnormal result of other cardiovascular function study: Secondary | ICD-10-CM | POA: Diagnosis not present

## 2014-09-03 DIAGNOSIS — R5383 Other fatigue: Secondary | ICD-10-CM

## 2014-09-03 HISTORY — PX: LEFT HEART CATHETERIZATION WITH CORONARY ANGIOGRAM: SHX5451

## 2014-09-03 SURGERY — LEFT HEART CATHETERIZATION WITH CORONARY ANGIOGRAM

## 2014-09-03 MED ORDER — HEPARIN SODIUM (PORCINE) 1000 UNIT/ML IJ SOLN
INTRAMUSCULAR | Status: AC
  Filled 2014-09-03: qty 1

## 2014-09-03 MED ORDER — HEPARIN (PORCINE) IN NACL 2-0.9 UNIT/ML-% IJ SOLN
INTRAMUSCULAR | Status: AC
  Filled 2014-09-03: qty 1500

## 2014-09-03 MED ORDER — SODIUM CHLORIDE 0.9 % IJ SOLN: 3.0000 mL | INTRAMUSCULAR | Status: DC | PRN

## 2014-09-03 MED ORDER — CLOPIDOGREL BISULFATE 75 MG PO TABS: 75.0000 mg | ORAL_TABLET | Freq: Every day | ORAL | Status: DC

## 2014-09-03 MED ORDER — LIDOCAINE HCL (PF) 1 % IJ SOLN
INTRAMUSCULAR | Status: AC
  Filled 2014-09-03: qty 30

## 2014-09-03 MED ORDER — SODIUM CHLORIDE 0.9 % IV SOLN
INTRAVENOUS | Status: AC
  Administered 2014-09-03: 100 mL/h via INTRAVENOUS

## 2014-09-03 MED ORDER — ASPIRIN 81 MG PO CHEW
CHEWABLE_TABLET | ORAL | Status: AC
  Administered 2014-09-03: 81 mg
  Filled 2014-09-03: qty 1

## 2014-09-03 MED ORDER — VERAPAMIL HCL 2.5 MG/ML IV SOLN
INTRAVENOUS | Status: AC
  Filled 2014-09-03: qty 2

## 2014-09-03 MED ORDER — MIDAZOLAM HCL 2 MG/2ML IJ SOLN
INTRAMUSCULAR | Status: AC
  Filled 2014-09-03: qty 2

## 2014-09-03 MED ORDER — ONDANSETRON HCL 4 MG/2ML IJ SOLN: 4.0000 mg | Freq: Four times a day (QID) | INTRAMUSCULAR | Status: DC | PRN

## 2014-09-03 MED ORDER — ACETAMINOPHEN 325 MG PO TABS: 650.0000 mg | ORAL_TABLET | ORAL | Status: DC | PRN

## 2014-09-03 MED ORDER — SODIUM CHLORIDE 0.9 % IV SOLN
INTRAVENOUS | Status: DC
  Administered 2014-09-03: 09:00:00 via INTRAVENOUS

## 2014-09-03 MED ORDER — FENTANYL CITRATE 0.05 MG/ML IJ SOLN
INTRAMUSCULAR | Status: AC
  Filled 2014-09-03: qty 2

## 2014-09-03 MED ORDER — ASPIRIN 81 MG PO CHEW: 81.0000 mg | CHEWABLE_TABLET | ORAL | Status: DC

## 2014-09-03 MED ORDER — NITROGLYCERIN 1 MG/10 ML FOR IR/CATH LAB
INTRA_ARTERIAL | Status: AC
  Filled 2014-09-03: qty 10

## 2014-09-03 NOTE — CV Procedure (Signed)
Robin FieldMartha J Juarez is a 81 y.o. female    [id number] LOCATION:  FACILITY: [facility]  PHYSICIAN: Nanetta BattyJonathan Berry, M.D. [birth year]   DATE OF PROCEDURE:  09/03/2014  DATE OF DISCHARGE:     CARDIAC CATHETERIZATION     History obtained from chart review.Robin Juarez is an 81 year old moderately overweight married Caucasian female mother of 4 children, grandmother of [phone] grandchildren who self-referred for evaluation of chest pain. Her primary care physician is Dr. Crawford GivensGraham Duncan. She took up in by one of her daughters [name] who I know. Her cardiac risk factors are notable for family history with brother does have coronary artery bypass grafting. She probably does have hyperlipidemia and hypertension which is not treated. She began having chest pain 2 weeks ago which is left precordial and radiates to her back. There are no other associated symptoms. She also initially had strokelike symptoms and had an MRI that showed a subacute stroke in the corona radiata. She had a Myoview stress test that was low risk but that show subtle anteroapical ischemia. Because of her new onset symptoms, risk factors and mildly abnormal stress test she presents now for outpatient diagnostic coronary arteriography to define her anatomy and rule out an ischemic etiology.   PROCEDURE DESCRIPTION:   The patient was brought to the second floor [facility] cath lab in the postabsorptive state. She was premedicated with Valium 5 mg by mouth, IV Versed and fentanyl. Her right groin was prepped and shaved in usual sterile fashion. Xylocaine 1% was used for local anesthesia. A 5 French sheath was inserted into the right common femoral artery using standard Seldinger technique. A 6 French sheath was inserted into the right radial artery without crepitation however the wire would not advance and even after administration of radial cocktail there was difficulty passing a wire. Angiography through the SideArm sheath  revealed accumulative right radial artery although there was a good waveform.   HEMODYNAMICS:    AO SYSTOLIC/AO DIASTOLIC: 143/72   LV SYSTOLIC/LV DIASTOLIC: 150/18  ANGIOGRAPHIC RESULTS:   1. Left main; normal  2. LAD; 40-50% mid after a moderate size diagonal branch 3. Left circumflex; nondominant and normal.  4. Right coronary artery; dominant and normal 5. Left ventriculography; RAO left ventriculogram was performed using  25 mL of Visipaque dye at 12 mL/second. The overall LVEF estimated  60 %  Without wall motion abnormalities  IMPRESSION: Ms Robin Juarez has essentially normal coronary arteries with a intermediate mid LAD lesion but did not appear to be hemodynamically significant. I believe her chest pain is noncardiac and her Myoview false positive. Anterior was performed of her right common femoral artery and her puncture site was sealed with a Vascade closure device with excellent hemostasis. The patient left the laboratory stable condition. She'll be hydrated for 2 hours and discharged home. She will be seen back in our office by mid-level provider in 2-3 weeks.  Robin Juarez,Robin Juarez, [facility] SystemFACC 09/03/2014 11:23 AM

## 2014-09-03 NOTE — H&P (View-Only) (Signed)
08/19/2014 Robin KayserMartha J Juarez   [birth year]  [id number]  Primary Physician Crawford GivensGraham Duncan, MD Primary Cardiologist: Runell GessJonathan J. Baila Rouse MD Roseanne RenoFACP,FACC,FAHA, FSCAI   HPI:  Robin Juarez is an 81 year old moderately overweight married Caucasian female mother of 4 children, grandmother of [phone] grandchildren who self-referred for evaluation of chest pain. Her primary care physician is Dr. Crawford GivensGraham Duncan. She took up in by one of her daughters Robin Juarez who I know. Her cardiac risk factors are notable for family history with brother does have coronary artery bypass grafting. She probably does have hyperlipidemia and hypertension which is not treated. She began having chest pain 2 weeks ago which is left precordial and radiates to her back. There are no other associated symptoms. She also initially had strokelike symptoms and had an MRI that showed a subacute stroke in the corona radiata.   Current Outpatient Prescriptions  Medication Sig Dispense Refill  . aspirin EC 325 MG tablet Take 1 tablet (325 mg total) by mouth daily.    . cholecalciferol (VITAMIN D) 1000 UNITS tablet Take 1,000 Units by mouth daily.      . DULoxetine (CYMBALTA) 60 MG capsule Take 60 mg by mouth every morning.     . folic acid (FOLVITE) 800 MCG tablet Take 800 mcg by mouth daily.     [name]. gabapentin (NEURONTIN) 300 MG capsule Take 300 mg by mouth 3 (three) times daily.     [name]. KRILL OIL OMEGA-3 PO Take 1 capsule by mouth daily.    [name]. lamoTRIgine (LAMICTAL) 100 MG tablet Take 100 mg by mouth at bedtime.    [name]. OLANZapine (ZYPREXA) 2.5 MG tablet Take 1.25 mg by mouth at bedtime.     . metoprolol succinate (TOPROL XL) 25 MG 24 hr tablet Take 1 tablet (25 mg total) by mouth daily. 30 tablet 6   No current facility-administered medications for this visit.    Allergies  Allergen Reactions  . Digoxin Other (See Comments)    Felt weak, tired, "aweful"  . Nitrofurantoin Other (See Comments)    Sharp pain back of head  .  Alendronate Sodium Other (See Comments)    cramps  . Hydrocodone-Acetaminophen Other (See Comments)    : Bad effect with other meds.  . Simvastatin Other (See Comments)    Myalgia, possible intolerance  . Sulfonamide Derivatives Other (See Comments)    REACTION: (Maybe)-neck pain-brief  . Augmentin [Amoxicillin-Pot Clavulanate] Rash    In mouth  . Cefaclor Other (See Comments)    Felt bad  . Cephalexin Other (See Comments)    Felt bad, but able to tolerate amoxil prev  . Chlordiazepoxide Other (See Comments)    REACTION: (Maybe)  . Clavulanic Acid Other (See Comments)    Not an allergy.  Tolerates plain amoxil but not augmentin- diarrhea, etc with augmentin  . Omeprazole Rash    rash  . Oxcarbazepine Other (See Comments)    unknown  . Pb-Hyoscy-Atropine-Scopolamine Other (See Comments)    Unsure-doesn't remember  . Streptomycin Other (See Comments)    Lips itched  . Tetracycline Itching    itching  . Zetia [Ezetimibe] Other (See Comments)    possible cause of aches and abnormal dreams, stopped 05/2014    History   Social History  . Marital Status: Married    Spouse Name: N/A    Number of Children: 4  . Years of Education: N/A   Occupational History  . RETIRED    Social History Main Topics  .  Smoking status: Never Smoker   . Smokeless tobacco: Never Used  . Alcohol Use: No  . Drug Use: No  . Sexual Activity: Not Currently   Other Topics Concern  . Not on file   Social History Narrative   Married 1953, husband is well.   5 pregnancies, 4 live births, all well (5th was unexpect pregnancy and patient needed to terminate due to concurrent medical problems at the time)   11 grandchildren.  3 great grandchildren   Regular exercise:  No   Caffeine:  Yes, coffee, tea, 2 servings daily.   Diet:  Yes, low fat, low sugar     Review of Systems: General: negative for chills, fever, night sweats or weight changes.  Cardiovascular: negative for chest pain, dyspnea on  exertion, edema, orthopnea, palpitations, paroxysmal nocturnal dyspnea or shortness of breath Dermatological: negative for rash Respiratory: negative for cough or wheezing Urologic: negative for hematuria Abdominal: negative for nausea, vomiting, diarrhea, bright red blood per rectum, melena, or hematemesis Neurologic: negative for visual changes, syncope, or dizziness All other systems reviewed and are otherwise negative except as noted above.    Blood pressure 140/86, pulse 84, height 5\' 1"  (1.549 m), weight 175 lb 12.8 oz (79.742 kg).  General appearance: alert and no distress Neck: no adenopathy, no carotid bruit, no JVD, supple, symmetrical, trachea midline and thyroid not enlarged, symmetric, no tenderness/mass/nodules Lungs: clear to auscultation bilaterally Heart: regular rate and rhythm, S1, S2 normal, no murmur, click, rub or gallop Extremities: extremities normal, atraumatic, no cyanosis or edema  EKG not performed today  ASSESSMENT AND PLAN:   Atypical chest pain Patient gives a history of atypical chest pain which is left precordial rate into her back that began 3 weeks ago. It occurs on a daily basis. It does not radiate otherwise nor are there any other associated symptoms. She does have family history of heart disease with a brother who had bypass surgery. I'm going to get a pharmacologic Myoview stress test and 2-D echocardiogram.  Essential hypertension Patient gives a history of a recent stroke like symptoms. MRI showed subacute to chronic stroke in the corona radiata. She does have a pretty complete blood pressure log book with spikes of hypertension and diastolic hypertension. I'm going to begin a low-dose beta blocker.      Runell GessJonathan J. Liev Brockbank MD FACP,FACC,FAHA, [facility]SCAI 08/19/2014 11:57 AM

## 2014-09-03 NOTE — Progress Notes (Signed)
Discharge instructions given per MD order Pt and CG able to verbalize understanding. Pt denies any discomfort at this time. Pt to car via wheelchair 

## 2014-09-03 NOTE — Discharge Instructions (Signed)

## 2014-09-03 NOTE — Interval H&P Note (Signed)
Cath Lab Visit (complete for each Cath Lab visit)  Clinical Evaluation Leading to the Procedure:   ACS: No.  Non-ACS:    Anginal Classification: CCS II  Anti-ischemic medical therapy: Minimal Therapy (1 class of medications)  Non-Invasive Test Results: Low-risk stress test findings: cardiac mortality <1%/year  Prior CABG: No previous CABG      History and Physical Interval Note:  09/03/2014 10:16 AM  Robin Juarez  has presented today for surgery, with the diagnosis of positive stress test  The various methods of treatment have been discussed with the patient and family. After consideration of risks, benefits and other options for treatment, the patient has consented to  Procedure(s): LEFT HEART CATHETERIZATION WITH CORONARY ANGIOGRAM (N/A) as a surgical intervention .  The patient's history has been reviewed, patient examined, no change in status, stable for surgery.  I have reviewed the patient's chart and labs.  Questions were answered to the patient's satisfaction.     Runell GessBERRY,Makani Seckman J

## 2014-09-04 ENCOUNTER — Telehealth: Payer: Self-pay | Admitting: Cardiovascular Disease

## 2014-09-04 ENCOUNTER — Encounter (HOSPITAL_COMMUNITY): Payer: Self-pay | Admitting: Cardiovascular Disease

## 2014-09-04 NOTE — Telephone Encounter (Signed)
Pt called in stating that she had a catheterization done on 1/14 around 10:30am and she would like to know when will she be able to go out to a restaurant to eat. Please call  Thanks

## 2014-09-08 ENCOUNTER — Telehealth: Payer: Self-pay | Admitting: Cardiovascular Disease

## 2014-09-08 NOTE — Telephone Encounter (Signed)
Pt described sensation of some pinching in wrist, not worsening, no bruising, and not impeding daily activities. Site appears normal to patient.  Denies numbness, pain, hot sensations, loss of motor fxn.   Advised rest, advance activity conservatively, to call later in the week if symptoms not improving. Patient voiced understanding,

## 2014-09-08 NOTE — Telephone Encounter (Signed)
Robin Juarez is calling because she had a cath on last Thursday and Dr.Berry started in her wrist but had to go through her groin , but her wrist is still sore and feels like something is sticking her . Want to know if there is something she can do about this . Please  call    Thanks

## 2014-09-15 ENCOUNTER — Telehealth: Payer: Self-pay | Admitting: Cardiovascular Disease

## 2014-09-15 MED ORDER — METOPROLOL SUCCINATE ER 25 MG PO TB24: 25.0000 mg | ORAL_TABLET | Freq: Every day | ORAL | Status: DC

## 2014-09-15 NOTE — Telephone Encounter (Signed)
Refilled per pt preference.

## 2014-09-15 NOTE — Telephone Encounter (Signed)
°  1. Which medications need to be refilled? Metoprolol  2. Which pharmacy is medication to be sent to? [facility] on [address]  3. Do they need a 30 day or 90 day supply? 90 days  4. Would they like a call back once the medication has been sent to the pharmacy? yes

## 2014-09-15 NOTE — Telephone Encounter (Signed)
Refill submitted to patient's preferred pharmacy. Informed patient. Pt voiced understanding, no other stated concerns at this time.  

## 2014-09-15 NOTE — Telephone Encounter (Signed)
Mrs. Dante GangWhitesell is calling because she would like to get her medication ( Emetoprolol Dentistuccer) sent to her pharmacy for a 90 day supply , she is now getting 30 days . Please call if you have any questions .[name]. Thanks

## 2014-09-22 ENCOUNTER — Ambulatory Visit (INDEPENDENT_AMBULATORY_CARE_PROVIDER_SITE_OTHER): Payer: Medicare Other | Admitting: Cardiology

## 2014-09-22 ENCOUNTER — Encounter (HOSPITAL_COMMUNITY): Payer: Medicare Other

## 2014-09-22 ENCOUNTER — Ambulatory Visit (HOSPITAL_COMMUNITY): Payer: Medicare Other

## 2014-09-22 ENCOUNTER — Ambulatory Visit: Payer: Medicare Other | Admitting: Cardiovascular Disease

## 2014-09-22 ENCOUNTER — Encounter: Payer: Self-pay | Admitting: Cardiology

## 2014-09-22 VITALS — BP 132/88 | HR 80 | Ht 61.0 in | Wt 176.6 lb

## 2014-09-22 DIAGNOSIS — I2510 Atherosclerotic heart disease of native coronary artery without angina pectoris: Secondary | ICD-10-CM

## 2014-09-22 NOTE — Progress Notes (Signed)
09/22/2014 Robin Juarez   09-14-1930  [id number]  Primary Physician Crawford Givens, MD Primary Cardiologist: Dr. Allyson Sabal  Reason for Visit/CC: Post LHC follow-up  HPI:  The patient is an 81 y/o female with a history of HTN, HLD and nonobstructive CAD who presents for post cath follow-up that was performed for w/u for chest pain. She is followed by Dr. Allyson Sabal. She had a Myoview stress test 08/26/14 that was low risk but showed subtle anteroapical ischemia. Because of her new onset symptoms, risk factors and mildly abnormal stress test she was referred for outpatient diagnostic coronary arteriography to define her anatomy and rule out an ischemic etiology.  She had essentially normal coronary arteries with a intermediate mid LAD lesion of 40-50% but did not appear to be hemodynamically significant. It was felt that her chest pain was noncardiac and her Myoview false positive. Continue medical therpay and risk factor modification was recommended.   She presents today for /u. She denies recurrent CP. She has been fully compliant with her medications. Her BP is controlled on metoprolol. She is not currently on prescription medications for cholesterol. She states that she was on Crestor in the past but was discontinued due to myalgias. She just started [employer] a week ago. Her most recent lipid panel 04/2014 ordered by her PCP revealed an elevated LDL of 132. She wishes to try alternatives before going back on statin therapy. She is scheduled to f/u with her PCP on 09/29/14 who will continue to follow cholesterol.    Current Outpatient Prescriptions  Medication Sig Dispense Refill  . cholecalciferol (VITAMIN D) 1000 UNITS tablet Take 1,000 Units by mouth daily.      . clonazePAM (KLONOPIN) 0.5 MG tablet Take 1 tablet (0.5 mg total) by mouth 2 (two) times daily as needed for anxiety. (Patient taking differently: Take 0.25 mg by mouth 2 (two) times daily as needed for anxiety. )    . clopidogrel  (PLAVIX) 75 MG tablet Take 1 tablet (75 mg total) by mouth daily. 90 tablet 1  . Coenzyme Q10 (CO Q 10 PO) Take 1 tablet by mouth daily.    . DULoxetine (CYMBALTA) 60 MG capsule Take 60 mg by mouth every morning.     . folic acid (FOLVITE) 800 MCG tablet Take 800 mcg by mouth daily.     [name] gabapentin (NEURONTIN) 300 MG capsule Take 300 mg by mouth 3 (three) times daily.     [name] KRILL OIL OMEGA-3 PO Take 1 capsule by mouth daily.    [name] lamoTRIgine (LAMICTAL) 100 MG tablet Take 100 mg by mouth at bedtime.    . metoprolol succinate (TOPROL XL) 25 MG 24 hr tablet Take 1 tablet (25 mg total) by mouth daily. 90 tablet 3  . Multiple Vitamins-Minerals (MULTIVITAMIN PO) Take 1 tablet by mouth daily.    [name] OLANZapine (ZYPREXA) 2.5 MG tablet Take 2.5 mg by mouth at bedtime.    . Red Yeast Rice 600 MG CAPS Take 1 capsule by mouth daily.     No current facility-administered medications for this visit.    Allergies  Allergen Reactions  . Digoxin Other (See Comments)    Felt weak, tired, "aweful"  . Nitrofurantoin Other (See Comments)    Sharp pain back of head  . Alendronate Sodium Other (See Comments)    cramps  . Hydrocodone-Acetaminophen Other (See Comments)    : Bad effect with other meds.  . Simvastatin Other (See Comments)    Myalgia,  possible intolerance  . Sulfonamide Derivatives Other (See Comments)    REACTION: (Maybe)-neck pain-brief  . Augmentin [Amoxicillin-Pot Clavulanate] Rash    In mouth  . Cefaclor Other (See Comments)    Felt bad  . Cephalexin Other (See Comments)    Felt bad, but able to tolerate amoxil prev  . Chlordiazepoxide Other (See Comments)    REACTION: (Maybe)  . Clavulanic Acid Other (See Comments)    Not an allergy.  Tolerates plain amoxil but not augmentin- diarrhea, etc with augmentin  . Omeprazole Rash    rash  . Oxcarbazepine Other (See Comments)    unknown  . Pb-Hyoscy-Atropine-Scopolamine Other (See Comments)    Unsure-doesn't remember  . Streptomycin  Other (See Comments)    Lips itched  . Tetracycline Itching    itching  . Zetia [Ezetimibe] Other (See Comments)    possible cause of aches and abnormal dreams, stopped 05/2014    History   Social History  . Marital Status: Married    Spouse Name: N/A    Number of Children: 4  . Years of Education: N/A   Occupational History  . RETIRED    Social History Main Topics  . Smoking status: Never Smoker   . Smokeless tobacco: Never Used     Comment: a small amount when 81 yo  . Alcohol Use: No  . Drug Use: No  . Sexual Activity: Not Currently   Other Topics Concern  . Not on file   Social History Narrative   Married 1953, husband is well.   5 pregnancies, 4 live births, all well (5th was unexpect pregnancy and patient needed to terminate due to concurrent medical problems at the time)   11 grandchildren.  3 great grandchildren   Regular exercise:  No   Caffeine:  Yes, coffee, tea, 2 servings daily.   Diet:  Yes, low fat, low sugar     Review of Systems: General: negative for chills, fever, night sweats or weight changes.  Cardiovascular: negative for chest pain, dyspnea on exertion, edema, orthopnea, palpitations, paroxysmal nocturnal dyspnea or shortness of breath Dermatological: negative for rash Respiratory: negative for cough or wheezing Urologic: negative for hematuria Abdominal: negative for nausea, vomiting, diarrhea, bright red blood per rectum, melena, or hematemesis Neurologic: negative for visual changes, syncope, or dizziness All other systems reviewed and are otherwise negative except as noted above.    Blood pressure 132/88, pulse 80, height 5\' 1"  (1.549 m), weight 176 lb 9.6 oz (80.105 kg).  General appearance: alert, cooperative and no distress Neck: no carotid bruit and no JVD Lungs: clear to auscultation bilaterally Heart: regular rate and rhythm, S1, S2 normal, no murmur, click, rub or gallop Extremities: no LEE Pulses: 2+ and symmetric Skin: warm  and dry Neurologic: Grossly normal    EKG Not performed  ASSESSMENT AND PLAN:   1. Abnormal NST: ruled false positive after LHC revealed relatively normal CAD with only mild LAD disease with 40-50% stenosis, not hemodynamically significant.   2. CAD: Mild nonobstructive CAD with 40-50% stenosis in LAD. No further CP. Continue medical therapy with Plavix and BB. Statin therapy recommended. Patient will attempt to control cholesterol through diet, exercise and Red Yeast Rice.  3. HTN: Well controlled. Continue BB therapy with metoprolol.  4. HLD: last lipid panel 9/15 demonstrated elevated LDL of 132. Has h/o myalgias with statin therapy in the past on Crestor. She wishes to try alternative methods such as Red Yeast Rice and increase physical activity and improve  diet. I recommend her PCP recheck lipid panel in 4 weeks. If no improvement, recommend initiation of another statin other than Crestor. With known LAD disease, would like to see LDL < 70 mg/dL.   PLAN  F/U with Dr. Allyson Sabal in 6 months for repeat evaluation.   Maricsa Sammons, BRITTAINYPA-C 09/22/2014 4:05 PM

## 2014-09-22 NOTE — Patient Instructions (Signed)
Brittainy Simmons, PA-C, has made no changes in your current medications or treatment plan.  Your physician wants you to follow-up in: 6 months with Dr San MorelleBerry You will receive a reminder letter in the mail two months in advance. If you don't receive a letter, please call our office to schedule the follow-up appointment.

## 2014-09-28 DIAGNOSIS — H3531 Nonexudative age-related macular degeneration: Secondary | ICD-10-CM | POA: Diagnosis not present

## 2014-09-29 ENCOUNTER — Encounter: Payer: Self-pay | Admitting: Family Medicine

## 2014-09-29 ENCOUNTER — Ambulatory Visit (INDEPENDENT_AMBULATORY_CARE_PROVIDER_SITE_OTHER): Payer: Medicare Other | Admitting: Family Medicine

## 2014-09-29 VITALS — BP 112/70 | HR 82 | Temp 98.5°F | Wt 177.8 lb

## 2014-09-29 DIAGNOSIS — I2510 Atherosclerotic heart disease of native coronary artery without angina pectoris: Secondary | ICD-10-CM

## 2014-09-29 DIAGNOSIS — F411 Generalized anxiety disorder: Secondary | ICD-10-CM

## 2014-09-29 DIAGNOSIS — F331 Major depressive disorder, recurrent, moderate: Secondary | ICD-10-CM

## 2014-09-29 DIAGNOSIS — I10 Essential (primary) hypertension: Secondary | ICD-10-CM

## 2014-09-29 MED ORDER — OLANZAPINE 2.5 MG PO TABS: ORAL_TABLET | ORAL | Status: DC

## 2014-09-29 MED ORDER — CLONAZEPAM 0.5 MG PO TABS: 0.2500 mg | ORAL_TABLET | Freq: Two times a day (BID) | ORAL | Status: DC | PRN

## 2014-09-29 NOTE — Patient Instructions (Signed)
Recheck lipids in about 2 months at a lab visit and then we'll go from there.   Take care.  Glad to see you.

## 2014-09-29 NOTE — Progress Notes (Signed)
Pre visit review using our clinic review tool, if applicable. No additional management support is needed unless otherwise documented below in the visit note.  F/u of several issues.  Tapering off zyprexa per psych, with no more freezing spells.  She feels good about that.  Some insomnia, treated with klonopin per psych.  She is adjusting to the changes in meds.  Mood is still good.  She smiles at the OV today.  D/w pt about med changes in detail.   HTN. BB added by cards. She feels "slower" in the meantime on med, but that is expected.  D/w pt.  She wanted to try RYR in the meantime instead of statin.  We discussed, would be reasonable to try and see how her lipids are in about 2 months.  Prev with cath w/o sig obstruction.  Likely false pos stress test prior to that.   Otherwise she has no new complaints or other issues today.   Meds, vitals, and allergies reviewed.   ROS: See HPI.  Otherwise, noncontributory.  GEN: nad, alert and oriented HEENT: mucous membranes moist NECK: supple w/o LA CV: rrr.  PULM: ctab, no inc wob ABD: soft, +bs EXT: no edema SKIN: no acute rash

## 2014-09-30 NOTE — Assessment & Plan Note (Signed)
Continue as is with current meds, she may still adjust to the BB dose. BP controlled. She agrees with plan. Recheck lipids in about 2 months.  She may need retrial of statin at that point.

## 2014-09-30 NOTE — Assessment & Plan Note (Signed)
Continue as is with meds per psych, app help from psych.  Mood good at this point and no freezing episodes.

## 2014-09-30 NOTE — Assessment & Plan Note (Deleted)
Continue as is with current meds, she may still adjust to the BB dose. BP controlled. She agrees with plan.

## 2014-10-08 DIAGNOSIS — G4700 Insomnia, unspecified: Secondary | ICD-10-CM | POA: Diagnosis not present

## 2014-10-08 DIAGNOSIS — F3181 Bipolar II disorder: Secondary | ICD-10-CM | POA: Diagnosis not present

## 2014-10-09 ENCOUNTER — Encounter: Payer: Self-pay | Admitting: Cardiovascular Disease

## 2014-10-22 DIAGNOSIS — G4700 Insomnia, unspecified: Secondary | ICD-10-CM | POA: Diagnosis not present

## 2014-10-22 DIAGNOSIS — F3181 Bipolar II disorder: Secondary | ICD-10-CM | POA: Diagnosis not present

## 2014-10-23 ENCOUNTER — Encounter: Payer: Self-pay | Admitting: Family Medicine

## 2014-10-23 ENCOUNTER — Ambulatory Visit (INDEPENDENT_AMBULATORY_CARE_PROVIDER_SITE_OTHER): Payer: Medicare Other | Admitting: Family Medicine

## 2014-10-23 ENCOUNTER — Telehealth: Payer: Self-pay | Admitting: Family Medicine

## 2014-10-23 VITALS — BP 142/80 | HR 87 | Temp 98.2°F | Wt 179.5 lb

## 2014-10-23 DIAGNOSIS — R300 Dysuria: Secondary | ICD-10-CM

## 2014-10-23 DIAGNOSIS — I2510 Atherosclerotic heart disease of native coronary artery without angina pectoris: Secondary | ICD-10-CM

## 2014-10-23 DIAGNOSIS — F331 Major depressive disorder, recurrent, moderate: Secondary | ICD-10-CM

## 2014-10-23 LAB — POCT URINALYSIS DIPSTICK
Bilirubin, UA: NEGATIVE
Glucose, UA: NEGATIVE
Ketones, UA: NEGATIVE
Leukocytes, UA: NEGATIVE
NITRITE UA: NEGATIVE
Protein, UA: NEGATIVE
Spec Grav, UA: 1.015
Urobilinogen, UA: 4
pH, UA: 6.5

## 2014-10-23 MED ORDER — CIPROFLOXACIN HCL 250 MG PO TABS: 250.0000 mg | ORAL_TABLET | Freq: Two times a day (BID) | ORAL | Status: DC

## 2014-10-23 NOTE — Telephone Encounter (Signed)
I would feel better if she saw you again.  We'll call her about coming back to see you.  Thanks.

## 2014-10-23 NOTE — Patient Instructions (Addendum)
Drink plenty of water and start the antibiotics today.  We'll contact you with your lab report.   Please call Dr. Arbutus Leasat about getting seen.   Take care.  Glad to see you.

## 2014-10-23 NOTE — Telephone Encounter (Signed)
Call from Dr. Wynonia LawmanHejazi.  Patient was off zyprexa but her depression returned.  She was miserable off medicine.   Med restarted, mood much improved, but has bradykinesia.  No freezing spells.  He asked about getting neuro eval again.   I will route to Dr. Arbutus Leasat for her input, and for a possible OV.  I appreciate help of all involved.   It is noted that she has a sig h/o depression and the risk of zyprexa may be worth the benefit.

## 2014-10-23 NOTE — Progress Notes (Signed)
Pre visit review using our clinic review tool, if applicable. No additional management support is needed unless otherwise documented below in the visit note.  D/w pt about zyprexa.  She has a tremor fixing her hair, holding a mirror.  Some bradykinesia.  D/w pt about her seeing Dr. Arbutus Leasat.  She agrees with that.    Dysuria:yes, burning with urination duration of symptoms: 3 days abdominal pain:no fevers:no back pain:no vomiting:no  Meds, vitals, and allergies reviewed.   ROS: See HPI.  Otherwise negative.    GEN: nad, alert and oriented HEENT: mucous membranes moist NECK: supple  CV: rrr.  PULM: ctab, no inc wob ABD: soft, +bs, suprapubic area not tender EXT: no edema BACK: no CVA pain Tremor with holding her mirror that she brought from home to demonstrate.   Slow gait noted.

## 2014-10-23 NOTE — Telephone Encounter (Signed)
I have no issue if she wants to make a f/u appt.  When I saw her last time, I actually saw no evidence of parkinsonism even on zyprexa, so I think that we will likely just need to watch her closely.

## 2014-10-25 DIAGNOSIS — R300 Dysuria: Secondary | ICD-10-CM | POA: Insufficient documentation

## 2014-10-25 LAB — URINE CULTURE
Colony Count: NO GROWTH
Organism ID, Bacteria: NO GROWTH

## 2014-10-25 NOTE — Assessment & Plan Note (Signed)
Presumed cystitis, start abx and check ucx, see notes on labs.  She agrees.

## 2014-10-25 NOTE — Assessment & Plan Note (Signed)
Mood much improved back on current meds, will have patient recheck with Dr. Arbutus Leasat.  Patient to call for OV. App Dr. Don Perkingat's input.  zyprexa really helped her mood.

## 2014-10-30 ENCOUNTER — Encounter: Payer: Self-pay | Admitting: *Deleted

## 2014-11-02 ENCOUNTER — Encounter: Payer: Self-pay | Admitting: Neurology

## 2014-11-02 ENCOUNTER — Ambulatory Visit (INDEPENDENT_AMBULATORY_CARE_PROVIDER_SITE_OTHER): Payer: Medicare Other | Admitting: Neurology

## 2014-11-02 VITALS — BP 116/78 | HR 76 | Ht 61.0 in | Wt 179.0 lb

## 2014-11-02 DIAGNOSIS — R9402 Abnormal brain scan: Secondary | ICD-10-CM

## 2014-11-02 DIAGNOSIS — R251 Tremor, unspecified: Secondary | ICD-10-CM

## 2014-11-02 DIAGNOSIS — I2510 Atherosclerotic heart disease of native coronary artery without angina pectoris: Secondary | ICD-10-CM | POA: Diagnosis not present

## 2014-11-02 NOTE — Progress Notes (Signed)
Robin Juarez was seen today in the movement disorders clinic for neurologic consultation at the request of Crawford GivensGraham Duncan, MD.   Prior records that were made available to me were reviewed.  She is accompanied by her daughter in law who supplements the history.    She states that she is here to talk about "stroke."  She states that she had LE weakness that started 4 weeks ago.  It started after she lifted something too heavy and she thought that the weakness was coming from her low back as she was having back pain.  If she sat, the legs would be really weak but she is clear that it was both legs.  Not long thereafter she noted difficulty with writing with the R hand.  No speech changes.  No paresthesias.  About 1 week later, she had a feeling inside of her like her muscles inside of her were freezing.  She had no trouble walking except that she was weak (does not describing freezing of gait).  States that the "freezing" was just a feeling on the inside like her lungs and inside were freezing momentarily.  Had 2 separate episodes.  Has had some tremor in the L arm for several years that she notes when holding up the L hand to do her hair.  She subsequently saw Dr. Para Marchuncan who ordered an MRI of the brain.  I had the opportunity to review this and reviewed the images with pt and her family as well.  This was done without gadolinium.  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  When I looked at this myself it looked like potentially chronic T2 shine through.  There was at least moderate small vessel disease.  She subsequently had a carotid ultrasound on 08/07/2014 that showed 1-39% stenosis bilaterally.  She is wearing a cardiac monitor today.  Her cholesterol was last checked on 05/07/2014 and her total cholesterol was 207 with an HDL of 54 and an LDL of 132.  She has been on statin medications in the past and has not been able to tolerate it so is  currently just on krill oil.  Pt states that she was on zocor and she had myalgia and it was attributed to the zocor but she has the same myalgia even after the zocor was stopped so she doesn't think that it was the zocor looking back.  She has tried zetia but it caused vivid dreams.  Pt states that the above sx's have now resolved.  After the results of the MRI came back her 81 mg aspirin was increased to 325 mg  11/02/14 update:  Pt seen today for f/u, accompanied by her husband who supplements the history.  Psychiatry tried to d/c zyprexa but depression got bad and had to restart.  She was sent for re-eval now that back on it.  Last visit, there was no evidence of parkinsonism, although the patient did describe some freezing.  Pt states that over the last few years she has had some tremor of the L arm.  She is wondering if her blood sugar isn't a reason she isn't feeling shaky.  Does think that beta blocker therapy has made her feel a bit weak.  She has had no falls since last visit.   ALLERGIES:   Allergies  Allergen Reactions  . Digoxin Other (See Comments)    Felt weak, tired, "aweful"  . Nitrofurantoin Other (See  Comments)    Sharp pain back of head  . Alendronate Sodium Other (See Comments)    cramps  . Hydrocodone-Acetaminophen Other (See Comments)    : Bad effect with other meds.  . Simvastatin Other (See Comments)    Myalgia, possible intolerance  . Sulfonamide Derivatives Other (See Comments)    REACTION: (Maybe)-neck pain-brief  . Augmentin [Amoxicillin-Pot Clavulanate] Rash    In mouth  . Cefaclor Other (See Comments)    Felt bad  . Cephalexin Other (See Comments)    Felt bad, but able to tolerate amoxil prev  . Chlordiazepoxide Other (See Comments)    REACTION: (Maybe)  . Clavulanic Acid Other (See Comments)    Not an allergy.  Tolerates plain amoxil but not augmentin- diarrhea, etc with augmentin  . Omeprazole Rash    rash  . Oxcarbazepine Other (See Comments)     unknown  . Pb-Hyoscy-Atropine-Scopolamine Other (See Comments)    Unsure-doesn't remember  . Streptomycin Other (See Comments)    Lips itched  . Tetracycline Itching    itching  . Zetia [Ezetimibe] Other (See Comments)    possible cause of aches and abnormal dreams, stopped 05/2014    CURRENT MEDICATIONS:  Outpatient Encounter Prescriptions as of 11/02/2014  Medication Sig  . cholecalciferol (VITAMIN D) 1000 UNITS tablet Take 1,000 Units by mouth daily.    . clopidogrel (PLAVIX) 75 MG tablet Take 1 tablet (75 mg total) by mouth daily.  . DULoxetine (CYMBALTA) 60 MG capsule Take 80 mg by mouth every morning.   . folic acid (FOLVITE) 800 MCG tablet Take 800 mcg by mouth daily.   [name] gabapentin (NEURONTIN) 300 MG capsule Take 300 mg by mouth 3 (three) times daily.   [name] KRILL OIL OMEGA-3 PO Take 1 capsule by mouth daily.  [name] lamoTRIgine (LAMICTAL) 100 MG tablet Take 100 mg by mouth at bedtime.  . metoprolol succinate (TOPROL XL) 25 MG 24 hr tablet Take 1 tablet (25 mg total) by mouth daily.  . Multiple Vitamins-Minerals (MULTIVITAMIN PO) Take 1 tablet by mouth daily.  [name] OLANZapine (ZYPREXA) 2.5 MG tablet Tapering off as of 09/29/14 per Dr. Wynonia Lawman.  . [DISCONTINUED] ciprofloxacin (CIPRO) 250 MG tablet Take 1 tablet (250 mg total) by mouth 2 (two) times daily.  . [DISCONTINUED] clonazePAM (KLONOPIN) 0.5 MG tablet Take one half (0.25 mg) once daily.  . [DISCONTINUED] Coenzyme Q10 (CO Q 10 PO) Take 1 tablet by mouth daily.  . [DISCONTINUED] Red Yeast Rice 600 MG CAPS Take 2 capsules by mouth daily.     PAST MEDICAL HISTORY:   Past Medical History  Diagnosis Date  . Hyperlipidemia   . GERD (gastroesophageal reflux disease)   . Osteoporosis   . Arthritis   . Fibromyalgia   . UTI (lower urinary tract infection)   . Urinary incontinence   . Depression 1993, 2000  . Blood transfusion without reported diagnosis     37 yrs ago -s/p back surgery  . OSA (obstructive sleep apnea)     dx 2010-had a  cpap-does not use-"uses special mouthpiece now"  . H/O hiatal hernia   . Stroke   . Hypertension   . Chest pain   . Positive cardiac stress test     PAST SURGICAL HISTORY:   Past Surgical History  Procedure Laterality Date  . Cardiovascular stress test  2009    Normal, Dr. Mayford Knife  . Breast biopsy  1995    x 2  . Abdominal hysterectomy  1976  .  Back fusions  1977    missing vertebra  . Childbirth  [id number]  . Ovarian cyst removal  1964  . Appendectomy  1964  . Dilatation and currettage  1964  . Cataract extraction, bilateral Bilateral 2005  . Breast biopsy  07/10/2012    Procedure: BREAST BIOPSY WITH NEEDLE LOCALIZATION;  Surgeon: Emelia Loron, MD;  Location: [facility];  Service: General;  Laterality: Right;  . Cystoscopy N/A 11/25/2012    Procedure: CYSTOSCOPY;  Surgeon: Martina Sinner, MD;  Location: [facility];  Service: Urology;  Laterality: N/A;  . Pubovaginal sling N/A 11/25/2012    Procedure: Leonides Grills;  Surgeon: Martina Sinner, MD;  Location: [facility];  Service: Urology;  Laterality: N/A;  . Back surgery      fusions lumbar area  . Lumbar laminectomy/decompression microdiscectomy N/A 05/01/2013    Procedure: COMPLETE DECOMPRESSIVE LUMBAR LAMINECTOMY L3-L4 MICRODISCECTOMY L3-L4 ON THE LEFT;  Surgeon: Jacki Cones, MD;  Location: [facility];  Service: Orthopedics;  Laterality: N/A;  . Left heart catheterization with coronary angiogram N/A 09/03/2014    Procedure: LEFT HEART CATHETERIZATION WITH CORONARY ANGIOGRAM;  Surgeon: Runell Gess, MD;  Location: [facility];  Service: Cardiovascular;  Laterality: N/A;    SOCIAL HISTORY:   History   Social History  . Marital Status: Married    Spouse Name: N/A  . Number of Children: 4  . Years of Education: N/A   Occupational History  . RETIRED    Social History Main Topics  . Smoking status: Never Smoker   . Smokeless tobacco: Never Used      Comment: a small amount when 81 yo  . Alcohol Use: No  . Drug Use: No  . Sexual Activity: Not Currently   Other Topics Concern  . Not on file   Social History Narrative   Married 1953, husband is well.   5 pregnancies, 4 live births, all well (5th was unexpect pregnancy and patient needed to terminate due to concurrent medical problems at the time)   11 grandchildren.  3 great grandchildren   Regular exercise:  No   Caffeine:  Yes, coffee, tea, 2 servings daily.   Diet:  Yes, low fat, low sugar    FAMILY HISTORY:   Family Status  Relation Status Death Age  . Mother Deceased     Old age  . Father Deceased 52    stroke  . Brother Deceased     during CABG  . Sister Alive     PD  . Sister Alive     Dementia  . Other Alive   . Sister Deceased     Home accident  . Brother Deceased     MVA  . Son Alive     healthy  . Son Alive     healthy  . Son Alive     healthy  . Daughter Alive     healthy    ROS:  A complete 10 system review of systems was obtained and was unremarkable apart from what is mentioned above.  PHYSICAL EXAMINATION:    VITALS:   Filed Vitals:   11/02/14 1312  BP: 116/78  Pulse: 76  Height:  (1.549 m)  Weight: 179 lb (81.194 kg)    GEN:  The patient appears younger than stated age and is in NAD. HEENT:  Normocephalic, atraumatic.  The mucous membranes are moist. The superficial temporal arteries are without ropiness or tenderness. CV:  RRR Lungs:  CTAB Neck/HEME:  There are no carotid bruits bilaterally.  Neurological examination:  Orientation: The patient is alert and oriented x3. Fund of knowledge is appropriate.  Recent and remote memory are intact.  Attention and concentration are normal.    Able to name objects and repeat phrases. Cranial nerves: There is good facial symmetry with the exception of asymmetric blink (chronic per patient).  The visual fields are full to confrontational testing. The speech is fluent and clear. Soft  palate rises symmetrically and there is no tongue deviation. Hearing is intact to conversational tone. Sensation: Sensation is intact to light touch throughout. Motor: Strength is 5/5 in the bilateral upper and lower extremities.   Shoulder shrug is equal and symmetric.  There is no pronator drift.   Movement examination: Tone: There is normal tone in the bilateral upper extremities.  The tone in the lower extremities is normal.  Abnormal movements: none Coordination:  There is no decremation with RAM's, seen with any form of RAM's, including alternating supination and pronation of the forearm, hand opening and closing, finger taps, heel taps and toe taps. Gait and Station: The patient has no difficulty arising out of a deep-seated chair without the use of the hands. The patient's stride length is normal.    ASSESSMENT/PLAN:  1.  Abnormal brain scan.  -While I am not completely convinced either based on history or based on the brain scan that this was a new cerebral infarct (think that this could just represent chronic T2 shine through), I agree with Dr. Para March that we should probably treat it as such.  He has already completed her carotid ultrasound.  She is on Plavix and doing well. 2.  I saw no evidence again today of parkinsonism from her Zyprexa or from any other reason.  She has no rigidity, no bradykinesia, no tremor even with distraction procedures.  I talked to her and her husband about utility of DaT scanning but think that it offers [place] very little here and they don't wish to proceed and I agree.  Greater than 50% of 25 min visit in counseling re: potential SE of these meds but she is doing well on zyprexa and risks of being off of it greater than being on it right now.   3.  She will follow-up with me on an as-needed basis.

## 2014-11-02 NOTE — Progress Notes (Signed)
Note faxed to Dr Wynonia LawmanHejazi at [phone] with confirmation received.

## 2014-11-17 ENCOUNTER — Ambulatory Visit (INDEPENDENT_AMBULATORY_CARE_PROVIDER_SITE_OTHER): Payer: Medicare Other | Admitting: Family Medicine

## 2014-11-17 ENCOUNTER — Encounter: Payer: Self-pay | Admitting: Family Medicine

## 2014-11-17 VITALS — BP 134/76 | HR 67 | Temp 98.8°F | Wt 181.5 lb

## 2014-11-17 DIAGNOSIS — I2510 Atherosclerotic heart disease of native coronary artery without angina pectoris: Secondary | ICD-10-CM | POA: Diagnosis not present

## 2014-11-17 DIAGNOSIS — Z23 Encounter for immunization: Secondary | ICD-10-CM

## 2014-11-17 DIAGNOSIS — M2170 Unequal limb length (acquired), unspecified site: Secondary | ICD-10-CM

## 2014-11-17 DIAGNOSIS — E785 Hyperlipidemia, unspecified: Secondary | ICD-10-CM | POA: Diagnosis not present

## 2014-11-17 MED ORDER — SIMVASTATIN 20 MG PO TABS: 10.0000 mg | ORAL_TABLET | Freq: Every day | ORAL | Status: DC

## 2014-11-17 NOTE — Progress Notes (Signed)
Pre visit review using our clinic review tool, if applicable. No additional management support is needed unless otherwise documented below in the visit note.  Prev dysuria resolved.  She has some intermittent sx recently, better with more fluids and cranberry pills.  No caffeine.  We talked about infection vs irritation and the similar sx.  No fevers.   HLD.  Possible statin intol prev.  Clearly didn't tolerate zetia.  Off both now.  She wanted to try simvastatin again vs pravastatin.  We talked about options, potency, potential for side effects with myalgias.  She is off red yeast rice.  Per patient, she has been encouraged by cards to consider statin.    She asked about leg length discrepancy, L leg shorter.  Has always had to get pants hemmed differently, higher on the L.  Some gait changes and pain noted by patient.   Meds, vitals, and allergies reviewed.   ROS: See HPI.  Otherwise, noncontributory.  GEN: nad, alert and oriented HEENT: mucous membranes moist NECK: supple w/o LA CV: rrr. PULM: ctab, no inc wob ABD: soft, +bs EXT: no edema Leg length initially thought to be equal on measurement.  This was compensated, ie with legs tilted to the L to compensate per her routine.  I think she does have a true discrepancy slightly <1" on remeasurement.

## 2014-11-17 NOTE — Patient Instructions (Signed)
1/2 pill a day of simvastatin for now.  Increase to 1 pill a day after 2 weeks.  Recheck fasting labs in about 2 months.   Try a small heel lift in your left shoe.  See if that helps and let me know if needed.  Take care.

## 2014-11-19 DIAGNOSIS — M2170 Unequal limb length (acquired), unspecified site: Secondary | ICD-10-CM | POA: Insufficient documentation

## 2014-11-19 NOTE — Assessment & Plan Note (Signed)
Reasonable to try low dose of statin and then inc if tolerated with recheck lipids in about 2 months.  She agrees.

## 2014-11-19 NOTE — Assessment & Plan Note (Signed)
She can try a small heel lift and f/u prn.  D/w pt.  She agrees.

## 2014-12-01 DIAGNOSIS — F3181 Bipolar II disorder: Secondary | ICD-10-CM | POA: Diagnosis not present

## 2014-12-01 DIAGNOSIS — G4700 Insomnia, unspecified: Secondary | ICD-10-CM | POA: Diagnosis not present

## 2015-01-08 DIAGNOSIS — Z4789 Encounter for other orthopedic aftercare: Secondary | ICD-10-CM | POA: Diagnosis not present

## 2015-01-11 DIAGNOSIS — Z1231 Encounter for screening mammogram for malignant neoplasm of breast: Secondary | ICD-10-CM | POA: Diagnosis not present

## 2015-01-12 ENCOUNTER — Encounter: Payer: Self-pay | Admitting: Family Medicine

## 2015-01-13 ENCOUNTER — Encounter: Payer: Self-pay | Admitting: *Deleted

## 2015-01-19 ENCOUNTER — Other Ambulatory Visit (INDEPENDENT_AMBULATORY_CARE_PROVIDER_SITE_OTHER): Payer: Medicare Other

## 2015-01-19 DIAGNOSIS — E785 Hyperlipidemia, unspecified: Secondary | ICD-10-CM | POA: Diagnosis not present

## 2015-01-20 LAB — LIPID PANEL
CHOLESTEROL: 205 mg/dL — AB (ref 0–200)
HDL: 74 mg/dL (ref >39.00)
LDL Cholesterol: 114 mg/dL — ABNORMAL HIGH (ref 0–99)
NonHDL: 131
Total CHOL/HDL Ratio: 3
Triglycerides: 86 mg/dL (ref 0.0–149.0)
VLDL: 17.2 mg/dL (ref 0.0–40.0)

## 2015-01-20 LAB — COMPREHENSIVE METABOLIC PANEL
ALT: 21 U/L (ref 0–35)
AST: 20 U/L (ref 0–37)
Albumin: 4.4 g/dL (ref 3.5–5.2)
Alkaline Phosphatase: 76 U/L (ref 39–117)
BUN: 16 mg/dL (ref 6–23)
CO2: 33 meq/L — AB (ref 19–32)
CREATININE: 0.95 mg/dL (ref 0.40–1.20)
Calcium: 9.8 mg/dL (ref 8.4–10.5)
Chloride: 100 mEq/L (ref 96–112)
GFR: 59.92 mL/min — AB (ref >60.00)
GLUCOSE: 99 mg/dL (ref 70–99)
Potassium: 4.7 mEq/L (ref 3.5–5.1)
Sodium: 138 mEq/L (ref 135–145)
Total Bilirubin: 0.7 mg/dL (ref 0.2–1.2)
Total Protein: 7.3 g/dL (ref 6.0–8.3)

## 2015-01-21 DIAGNOSIS — F3181 Bipolar II disorder: Secondary | ICD-10-CM | POA: Diagnosis not present

## 2015-01-21 DIAGNOSIS — G4700 Insomnia, unspecified: Secondary | ICD-10-CM | POA: Diagnosis not present

## 2015-01-25 ENCOUNTER — Other Ambulatory Visit: Payer: Self-pay | Admitting: Neurology

## 2015-01-25 NOTE — Telephone Encounter (Signed)
RX refill sent to patient's pharmacy with note that future refills to go through PCP.

## 2015-01-25 NOTE — Telephone Encounter (Signed)
Patient is following with us PRN. Should be fill Plavix or should this go through PCP?

## 2015-01-25 NOTE — Telephone Encounter (Signed)
Lesly RubensteinJade, go ahead and refill for 3 months but tell pt that in the future, refills will need to come from PCP since no longer f/u with me

## 2015-03-23 ENCOUNTER — Encounter: Payer: Self-pay | Admitting: Cardiovascular Disease

## 2015-03-23 ENCOUNTER — Ambulatory Visit (INDEPENDENT_AMBULATORY_CARE_PROVIDER_SITE_OTHER): Payer: Medicare Other | Admitting: Cardiovascular Disease

## 2015-03-23 VITALS — BP 138/82 | HR 77 | Ht 61.0 in | Wt 180.0 lb

## 2015-03-23 DIAGNOSIS — R0789 Other chest pain: Secondary | ICD-10-CM | POA: Diagnosis not present

## 2015-03-23 DIAGNOSIS — E785 Hyperlipidemia, unspecified: Secondary | ICD-10-CM

## 2015-03-23 DIAGNOSIS — Z79899 Other long term (current) drug therapy: Secondary | ICD-10-CM

## 2015-03-23 MED ORDER — PRAVASTATIN SODIUM 20 MG PO TABS: 20.0000 mg | ORAL_TABLET | Freq: Every evening | ORAL | Status: DC

## 2015-03-23 NOTE — Progress Notes (Signed)
03/23/2015 Robin Juarez   1931/03/08  [id number]  Primary Physician Crawford Givens, MD Primary Cardiologist: Runell Gess MD [name]   HPI:  Miss Wallis is an 81 year old moderately overweight married Caucasian female mother of 4 children, grandmother of 80 grandchildren who self-referred for evaluation of chest pain. I last saw her in the office 08/31/14.Her primary care physician is Dr. Crawford Givens. She took up in by one of her daughters Jonell Cluck who I know. Her cardiac risk factors are notable for family history with brother does have coronary artery bypass grafting. She probably does have hyperlipidemia and hypertension which is not treated. She began having chest pain 2 weeks ago which is left precordial and radiates to her back. There are no other associated symptoms. She also initially had strokelike symptoms and had an MRI that showed a subacute stroke in the corona radiata. She had a Myoview stress test that was low risk but that show subtle anteroapical ischemia. I performed cardiac catheterization on her 09/03/14 through the right femoral approach revealing at most 50% mid LAD stenosis after a moderate sized diagonal branch with otherwise normal coronary arteries and normal LV function. I thought her chest pain was noncardiac. She's remained stable since.  Current Outpatient Prescriptions  Medication Sig Dispense Refill  . cholecalciferol (VITAMIN D) 1000 UNITS tablet Take 1,000 Units by mouth daily.      . clopidogrel (PLAVIX) 75 MG tablet TAKE 1 TABLET (75 MG TOTAL) BY MOUTH DAILY. 90 tablet 0  . DULoxetine (CYMBALTA) 60 MG capsule Take 90 mg by mouth every morning.     . folic acid (FOLVITE) 800 MCG tablet Take 800 mcg by mouth daily.     [name] gabapentin (NEURONTIN) 300 MG capsule Take 300 mg by mouth 4 (four) times daily.     [name] KRILL OIL OMEGA-3 PO Take 1 capsule by mouth daily.    [name] lamoTRIgine (LAMICTAL) 100 MG tablet Take 100 mg by mouth at  bedtime.    . metoprolol succinate (TOPROL XL) 25 MG 24 hr tablet Take 1 tablet (25 mg total) by mouth daily. 90 tablet 3  . Multiple Vitamins-Minerals (MULTIVITAMIN PO) Take 1 tablet by mouth daily.    [name] OLANZapine (ZYPREXA) 2.5 MG tablet Tapering off as of 09/29/14 per Dr. Wynonia Lawman.     No current facility-administered medications for this visit.    Allergies  Allergen Reactions  . Digoxin Other (See Comments)    Felt weak, tired, "aweful"  . Nitrofurantoin Other (See Comments)    Sharp pain back of head  . Alendronate Sodium Other (See Comments)    cramps  . Simvastatin Other (See Comments)    Myalgia, possible intolerance  . Sulfonamide Derivatives Other (See Comments)    REACTION: (Maybe)-neck pain-brief  . Augmentin [Amoxicillin-Pot Clavulanate] Rash    In mouth  . Cefaclor Other (See Comments)    Felt bad  . Cephalexin Other (See Comments)    Felt bad, but able to tolerate amoxil prev  . Chlordiazepoxide Other (See Comments)    REACTION: (Maybe)  . Clavulanic Acid Other (See Comments)    Not an allergy.  Tolerates plain amoxil but not augmentin- diarrhea, etc with augmentin  . Omeprazole Rash    rash  . Oxcarbazepine Other (See Comments)    unknown  . Pb-Hyoscy-Atropine-Scopolamine Other (See Comments)    Unsure-doesn't remember  . Streptomycin Other (See Comments)    Lips itched  . Tetracycline Itching    itching  .  Zetia [Ezetimibe] Other (See Comments)    possible cause of aches and abnormal dreams, stopped 05/2014    History   Social History  . Marital Status: Married    Spouse Name: N/A  . Number of Children: 4  . Years of Education: N/A   Occupational History  . RETIRED    Social History Main Topics  . Smoking status: Never Smoker   . Smokeless tobacco: Never Used     Comment: a small amount when 81 yo  . Alcohol Use: No  . Drug Use: No  . Sexual Activity: Not Currently   Other Topics Concern  . Not on file   Social History Narrative    Married 1953, husband is well.   5 pregnancies, 4 live births, all well (5th was unexpect pregnancy and patient needed to terminate due to concurrent medical problems at the time)   11 grandchildren.  3 great grandchildren   Regular exercise:  No   Caffeine:  Yes, coffee, tea, 2 servings daily.   Diet:  Yes, low fat, low sugar     Review of Systems: General: negative for chills, fever, night sweats or weight changes.  Cardiovascular: negative for chest pain, dyspnea on exertion, edema, orthopnea, palpitations, paroxysmal nocturnal dyspnea or shortness of breath Dermatological: negative for rash Respiratory: negative for cough or wheezing Urologic: negative for hematuria Abdominal: negative for nausea, vomiting, diarrhea, bright red blood per rectum, melena, or hematemesis Neurologic: negative for visual changes, syncope, or dizziness All other systems reviewed and are otherwise negative except as noted above.    Blood pressure 138/82, pulse 77, height 5\' 1"  (1.549 m), weight 180 lb (81.647 kg).  General appearance: alert and no distress Neck: no adenopathy, no carotid bruit, no JVD, supple, symmetrical, trachea midline and thyroid not enlarged, symmetric, no tenderness/mass/nodules Lungs: clear to auscultation bilaterally Heart: regular rate and rhythm, S1, S2 normal, no murmur, click, rub or gallop Extremities: extremities normal, atraumatic, no cyanosis or edema  EKG /77 without ST or T-wave changes. I personally reviewed this EKG  ASSESSMENT AND PLAN:   HLD (hyperlipidemia) History of hyperlipidemia intolerant to Zocor. Her most recent lipid profile performed 01/19/15 revealed a total cholesterol 205, LDL 114 HDL 74. I'm going to begin her on Pravachol 20 mg a day and will recheck a lipid and liver profile in 2 months.  Essential hypertension History of hypertension or blood pressure measured at 138/82. She is on metoprolol. Continue current meds at current dosing  Atypical  chest pain History of atypical chest pain with a minimally abnormal Myoview stress test leading to a cardiac catheterization 09/03/14. This showed a 50% mid LAD lesion but otherwise normal coronary arteries and normal LV function. I thought her chest pain was noncardiac.      Runell Gess MD FACP,FACC,FAHA, [facility] 03/23/2015 1:43 PM

## 2015-03-23 NOTE — Assessment & Plan Note (Signed)
History of atypical chest pain with a minimally abnormal Myoview stress test leading to a cardiac catheterization 09/03/14. This showed a 50% mid LAD lesion but otherwise normal coronary arteries and normal LV function. I thought her chest pain was noncardiac.

## 2015-03-23 NOTE — Assessment & Plan Note (Signed)
History of hyperlipidemia intolerant to Zocor. Her most recent lipid profile performed 01/19/15 revealed a total cholesterol 205, LDL 114 HDL 74. I'm going to begin her on Pravachol 20 mg a day and will recheck a lipid and liver profile in 2 months.

## 2015-03-23 NOTE — Patient Instructions (Signed)
  We will see you back in follow up in 1 year with Dr Allyson Sabal.   Dr Allyson Sabal has ordered: 1. Start Pravastatin  daily  2. A FASTING lipid profile: to be done in 2 months.  There is a [employer] lab on the first floor of this building, [address].  They are open from 8am-5pm with a lunch from 12-2.  You do not need an appointment.

## 2015-03-23 NOTE — Assessment & Plan Note (Signed)
History of hypertension or blood pressure measured at 138/82. She is on metoprolol. Continue current meds at current dosing

## 2015-03-25 ENCOUNTER — Other Ambulatory Visit: Payer: Self-pay

## 2015-04-10 ENCOUNTER — Other Ambulatory Visit: Payer: Self-pay | Admitting: Neurology

## 2015-04-12 NOTE — Telephone Encounter (Signed)
Plavix refill denied. Patient needs to contact PCP.

## 2015-04-15 DIAGNOSIS — M7061 Trochanteric bursitis, right hip: Secondary | ICD-10-CM | POA: Diagnosis not present

## 2015-04-15 DIAGNOSIS — M1611 Unilateral primary osteoarthritis, right hip: Secondary | ICD-10-CM | POA: Diagnosis not present

## 2015-04-27 ENCOUNTER — Other Ambulatory Visit: Payer: Self-pay | Admitting: Neurology

## 2015-05-01 ENCOUNTER — Other Ambulatory Visit: Payer: Self-pay | Admitting: Neurology

## 2015-05-05 ENCOUNTER — Other Ambulatory Visit: Payer: Self-pay

## 2015-05-05 ENCOUNTER — Telehealth: Payer: Self-pay | Admitting: Neurology

## 2015-05-05 MED ORDER — CLOPIDOGREL BISULFATE 75 MG PO TABS: ORAL_TABLET | ORAL | Status: DC

## 2015-05-05 NOTE — Telephone Encounter (Signed)
Pt [phone] concerning/ Plavix med refill/ getting denied/ by [facility] [address]/ Pharmacy # [id number]

## 2015-05-05 NOTE — Telephone Encounter (Signed)
Pt left v/m requesting refill plavix to [facility]; pt no longer sees Dr Tat and pt wants Dr Para March to start prescribing plavix to [facility]. Is it OK to refill? Pt last seen 11/17/14. Pt request cb.

## 2015-05-05 NOTE — Telephone Encounter (Signed)
Sent. Thanks.   

## 2015-05-05 NOTE — Telephone Encounter (Signed)
Patient made aware note to the pharmacy that further refills to go through PCP since she is not following up here anymore. She will call if needed.

## 2015-05-18 ENCOUNTER — Telehealth: Payer: Self-pay | Admitting: Cardiovascular Disease

## 2015-05-18 DIAGNOSIS — F3181 Bipolar II disorder: Secondary | ICD-10-CM | POA: Diagnosis not present

## 2015-05-18 DIAGNOSIS — G4700 Insomnia, unspecified: Secondary | ICD-10-CM | POA: Diagnosis not present

## 2015-05-18 NOTE — Telephone Encounter (Signed)
Called patient and confirmed address, placed lab slips in outgoing mail - pt aware.

## 2015-05-18 NOTE — Telephone Encounter (Signed)
Pt misplaced her lab order,would you please mail her another one.

## 2015-05-25 ENCOUNTER — Ambulatory Visit (INDEPENDENT_AMBULATORY_CARE_PROVIDER_SITE_OTHER): Payer: Medicare Other

## 2015-05-25 ENCOUNTER — Other Ambulatory Visit (INDEPENDENT_AMBULATORY_CARE_PROVIDER_SITE_OTHER): Payer: Medicare Other

## 2015-05-25 DIAGNOSIS — Z23 Encounter for immunization: Secondary | ICD-10-CM | POA: Diagnosis not present

## 2015-05-25 DIAGNOSIS — E785 Hyperlipidemia, unspecified: Secondary | ICD-10-CM | POA: Diagnosis not present

## 2015-05-25 DIAGNOSIS — Z79899 Other long term (current) drug therapy: Secondary | ICD-10-CM | POA: Diagnosis not present

## 2015-05-25 DIAGNOSIS — R0789 Other chest pain: Secondary | ICD-10-CM

## 2015-05-26 LAB — HEPATIC FUNCTION PANEL
ALK PHOS: 74 U/L (ref 33–130)
ALT: 19 U/L (ref 6–29)
AST: 22 U/L (ref 10–35)
Albumin: 4.2 g/dL (ref 3.6–5.1)
BILIRUBIN INDIRECT: 0.6 mg/dL (ref 0.2–1.2)
BILIRUBIN TOTAL: 0.7 mg/dL (ref 0.2–1.2)
Bilirubin, Direct: 0.1 mg/dL (ref <=0.2)
Total Protein: 6.6 g/dL (ref 6.1–8.1)

## 2015-05-26 LAB — LIPID PANEL
Cholesterol: 199 mg/dL (ref 125–200)
HDL: 63 mg/dL
LDL Cholesterol: 114 mg/dL
Total CHOL/HDL Ratio: 3.2 ratio
Triglycerides: 112 mg/dL
VLDL: 22 mg/dL

## 2015-05-27 DIAGNOSIS — M7061 Trochanteric bursitis, right hip: Secondary | ICD-10-CM | POA: Diagnosis not present

## 2015-05-27 DIAGNOSIS — M1611 Unilateral primary osteoarthritis, right hip: Secondary | ICD-10-CM | POA: Diagnosis not present

## 2015-06-06 ENCOUNTER — Emergency Department (HOSPITAL_COMMUNITY)
Admission: EM | Admit: 2015-06-06 | Discharge: 2015-06-06 | Disposition: A | Discharge status: Home / Self Care | Payer: Medicare Other | Attending: Emergency Medicine | Admitting: Emergency Medicine

## 2015-06-06 ENCOUNTER — Emergency Department (HOSPITAL_COMMUNITY): Payer: Medicare Other

## 2015-06-06 ENCOUNTER — Encounter (HOSPITAL_COMMUNITY): Payer: Self-pay | Admitting: Emergency Medicine

## 2015-06-06 DIAGNOSIS — M5386 Other specified dorsopathies, lumbar region: Secondary | ICD-10-CM | POA: Diagnosis not present

## 2015-06-06 DIAGNOSIS — M5432 Sciatica, left side: Secondary | ICD-10-CM | POA: Diagnosis not present

## 2015-06-06 DIAGNOSIS — I2510 Atherosclerotic heart disease of native coronary artery without angina pectoris: Secondary | ICD-10-CM | POA: Diagnosis not present

## 2015-06-06 DIAGNOSIS — M25552 Pain in left hip: Secondary | ICD-10-CM | POA: Diagnosis not present

## 2015-06-06 DIAGNOSIS — K219 Gastro-esophageal reflux disease without esophagitis: Secondary | ICD-10-CM | POA: Insufficient documentation

## 2015-06-06 DIAGNOSIS — R1032 Left lower quadrant pain: Secondary | ICD-10-CM | POA: Diagnosis not present

## 2015-06-06 DIAGNOSIS — M5442 Lumbago with sciatica, left side: Secondary | ICD-10-CM | POA: Insufficient documentation

## 2015-06-06 DIAGNOSIS — E785 Hyperlipidemia, unspecified: Secondary | ICD-10-CM | POA: Insufficient documentation

## 2015-06-06 DIAGNOSIS — Z79899 Other long term (current) drug therapy: Secondary | ICD-10-CM | POA: Diagnosis not present

## 2015-06-06 DIAGNOSIS — M545 Low back pain: Secondary | ICD-10-CM | POA: Diagnosis not present

## 2015-06-06 DIAGNOSIS — F329 Major depressive disorder, single episode, unspecified: Secondary | ICD-10-CM | POA: Insufficient documentation

## 2015-06-06 DIAGNOSIS — Z87440 Personal history of urinary (tract) infections: Secondary | ICD-10-CM | POA: Diagnosis not present

## 2015-06-06 DIAGNOSIS — M1990 Unspecified osteoarthritis, unspecified site: Secondary | ICD-10-CM | POA: Diagnosis not present

## 2015-06-06 DIAGNOSIS — I10 Essential (primary) hypertension: Secondary | ICD-10-CM | POA: Diagnosis not present

## 2015-06-06 DIAGNOSIS — Z7902 Long term (current) use of antithrombotics/antiplatelets: Secondary | ICD-10-CM | POA: Insufficient documentation

## 2015-06-06 DIAGNOSIS — M79605 Pain in left leg: Secondary | ICD-10-CM | POA: Diagnosis not present

## 2015-06-06 DIAGNOSIS — Z8673 Personal history of transient ischemic attack (TIA), and cerebral infarction without residual deficits: Secondary | ICD-10-CM | POA: Diagnosis not present

## 2015-06-06 DIAGNOSIS — M25562 Pain in left knee: Secondary | ICD-10-CM | POA: Diagnosis not present

## 2015-06-06 MED ORDER — OXYCODONE-ACETAMINOPHEN 5-325 MG PO TABS: 1.0000 | ORAL_TABLET | Freq: Four times a day (QID) | ORAL | Status: DC | PRN

## 2015-06-06 MED ORDER — OXYCODONE-ACETAMINOPHEN 5-325 MG PO TABS
1.0000 | ORAL_TABLET | Freq: Once | ORAL | Status: AC
  Administered 2015-06-06: 1 via ORAL
  Filled 2015-06-06: qty 1

## 2015-06-06 MED ORDER — PREDNISONE 10 MG PO TABS: 20.0000 mg | ORAL_TABLET | Freq: Every day | ORAL | Status: DC

## 2015-06-06 NOTE — Discharge Instructions (Signed)
Follow up with your md later this week. °

## 2015-06-06 NOTE — ED Provider Notes (Signed)
CSN: [id number]     Arrival date & time 06/06/15  4098 History   First MD Initiated Contact with Patient 06/06/15 [phone]     Chief Complaint  Patient presents with  . Leg Pain     (Consider location/radiation/quality/duration/timing/severity/associated sxs/prior Treatment) Patient is a 81 y.o. female presenting with leg pain. The history is provided by the patient (The patient states that she has pain running down her left leg all the way down to her feet no pain with movement she's had previous back surgery.).  Leg Pain Location:  Leg Leg location:  L leg Pain details:    Quality:  Aching   Severity:  Moderate   Onset quality:  Gradual   Timing:  Constant   Progression:  Waxing and waning Associated symptoms: no back pain and no fatigue     Past Medical History  Diagnosis Date  . Hyperlipidemia   . GERD (gastroesophageal reflux disease)   . Osteoporosis   . Arthritis   . Fibromyalgia   . UTI (lower urinary tract infection)   . Urinary incontinence   . Depression 1993, 2000  . Blood transfusion without reported diagnosis     37 yrs ago -s/p back surgery  . OSA (obstructive sleep apnea)     dx 2010-had a cpap-does not use-"uses special mouthpiece now"  . H/O hiatal hernia   . Stroke (HCC)   . Hypertension   . Chest pain   . Positive cardiac stress test   . Coronary artery disease     50% mid LAD by cath January 2016   Past Surgical History  Procedure Laterality Date  . Cardiovascular stress test  2009    Normal, Dr. Mayford Knife  . Breast biopsy  1995    x 2  . Abdominal hysterectomy  1976  . Back fusions  1977    missing vertebra  . Childbirth  [phone]  . Ovarian cyst removal  1964  . Appendectomy  1964  . Dilatation and currettage  1964  . Cataract extraction, bilateral Bilateral 2005  . Breast biopsy  07/10/2012    Procedure: BREAST BIOPSY WITH NEEDLE LOCALIZATION;  Surgeon: Emelia Loron, MD;  Location: [facility];  Service:  General;  Laterality: Right;  . Cystoscopy N/A 11/25/2012    Procedure: CYSTOSCOPY;  Surgeon: Martina Sinner, MD;  Location: [facility];  Service: Urology;  Laterality: N/A;  . Pubovaginal sling N/A 11/25/2012    Procedure: Leonides Grills;  Surgeon: Martina Sinner, MD;  Location: [facility];  Service: Urology;  Laterality: N/A;  . Back surgery      fusions lumbar area  . Lumbar laminectomy/decompression microdiscectomy N/A 05/01/2013    Procedure: COMPLETE DECOMPRESSIVE LUMBAR LAMINECTOMY L3-L4 MICRODISCECTOMY L3-L4 ON THE LEFT;  Surgeon: Jacki Cones, MD;  Location: [facility];  Service: Orthopedics;  Laterality: N/A;  . Left heart catheterization with coronary angiogram N/A 09/03/2014    Procedure: LEFT HEART CATHETERIZATION WITH CORONARY ANGIOGRAM;  Surgeon: Runell Gess, MD;  Location: [facility] C[facility];  Service: Cardiovascular;  Laterality: N/A;   Family History  Problem Relation Age of Onset  . Arthritis Mother   . Kidney disease Maternal Grandmother   . Stroke Father   . Hypertension Father   . Heart disease Brother     Died during CABG  . Hypertension Sister   . Dementia Sister   . Aneurysm Sister     Brain  . Parkinsonism Sister   . Colon  cancer Neg Hx   . Breast cancer Neg Hx    Social History  Substance Use Topics  . Smoking status: Never Smoker   . Smokeless tobacco: Never Used     Comment: a small amount when 81 yo  . Alcohol Use: No   OB History    No data available     Review of Systems  Constitutional: Negative for appetite change and fatigue.  HENT: Negative for congestion, ear discharge and sinus pressure.   Eyes: Negative for discharge.  Respiratory: Negative for cough.   Cardiovascular: Negative for chest pain.  Gastrointestinal: Negative for abdominal pain and diarrhea.  Genitourinary: Negative for frequency and hematuria.  Musculoskeletal: Negative for back pain.       Left leg pain  Skin: Negative for rash.   Neurological: Negative for seizures and headaches.  Psychiatric/Behavioral: Negative for hallucinations.      Allergies  Digoxin; Nitrofurantoin; Alendronate sodium; Simvastatin; Sulfonamide derivatives; Augmentin; Cefaclor; Cephalexin; Chlordiazepoxide; Clavulanic acid; Omeprazole; Oxcarbazepine; Pb-hyoscy-atropine-scopolamine; Streptomycin; Tetracycline; and Zetia  Home Medications   Prior to Admission medications   Medication Sig Start Date End Date Taking? Authorizing Provider  cholecalciferol (VITAMIN D) 1000 UNITS tablet Take 1,000 Units by mouth daily.      Historical Provider, MD  clopidogrel (PLAVIX) 75 MG tablet TAKE 1 TABLET (75 MG TOTAL) BY MOUTH DAILY. 05/05/15   Joaquim NamGraham S Duncan, MD  DULoxetine (CYMBALTA) 60 MG capsule Take 90 mg by mouth every morning.     Historical Provider, MD  folic acid (FOLVITE) 800 MCG tablet Take 800 mcg by mouth daily.     Historical Provider, MD  gabapentin (NEURONTIN) 300 MG capsule Take 300 mg by mouth 4 (four) times daily.     Historical Provider, MD  KRILL OIL OMEGA-3 PO Take 1 capsule by mouth daily.    Historical Provider, MD  lamoTRIgine (LAMICTAL) 100 MG tablet Take 100 mg by mouth at bedtime.    Historical Provider, MD  metoprolol succinate (TOPROL XL) 25 MG 24 hr tablet Take 1 tablet (25 mg total) by mouth daily. 09/15/14   Runell GessJonathan J Berry, MD  Multiple Vitamins-Minerals (MULTIVITAMIN PO) Take 1 tablet by mouth daily.    Historical Provider, MD  OLANZapine (ZYPREXA) 2.5 MG tablet Tapering off as of 09/29/14 per Dr. Wynonia LawmanHejazi. 09/29/14   Joaquim NamGraham S Duncan, MD  oxyCODONE-acetaminophen (PERCOCET) 5-325 MG tablet Take 1 tablet by mouth every 6 (six) hours as needed. 06/06/15   Bethann BerkshireJoseph Jacqulene Huntley, MD  pravastatin (PRAVACHOL) 20 MG tablet Take 1 tablet (20 mg total) by mouth every evening. 03/23/15   Runell GessJonathan J Berry, MD  predniSONE (DELTASONE) 10 MG tablet Take 2 tablets (20 mg total) by mouth daily. 06/06/15   Bethann BerkshireJoseph Heru Montz, MD   BP 159/74 mmHg  Pulse 71   Temp(Src) 97.9 F (36.6 C) (Oral)  Resp 16  SpO2 95% Physical Exam  Constitutional: She is oriented to person, place, and time. She appears well-developed.  HENT:  Head: Normocephalic.  Eyes: Conjunctivae and EOM are normal. No scleral icterus.  Neck: Neck supple. No thyromegaly present.  Cardiovascular: Normal rate and regular rhythm.  Exam reveals no gallop and no friction rub.   No murmur heard. Pulmonary/Chest: No stridor. She has no wheezes. She has no rales. She exhibits no tenderness.  Abdominal: She exhibits no distension. There is no tenderness. There is no rebound.  Musculoskeletal: Normal range of motion. She exhibits no edema.  Pos.  Straight leg raise left  Lymphadenopathy:    She  has no cervical adenopathy.  Neurological: She is oriented to person, place, and time. She exhibits normal muscle tone. Coordination normal.  Skin: No rash noted. No erythema.  Psychiatric: She has a normal mood and affect. Her behavior is normal.    ED Course  Procedures (including critical care time) Labs Review Labs Reviewed - No data to display  Imaging Review Dg Lumbar Spine Complete  06/06/2015  CLINICAL DATA:  Low back pain EXAM: LUMBAR SPINE - COMPLETE 4+ VIEW COMPARISON:  05/01/2013 FINDINGS: Five views of lumbar spine submitted. No acute fracture. Multilevel anterior spurring again noted. There is significant disc space flattening with anterior spurring at L1-L2-L2-L3 and L3-L4 level. Atherosclerotic calcifications of abdominal aorta are noted. There is about 1.3 cm anterolisthesis L5 on S1 vertebral body again noted. Disc space flattening at L5-S1 level with anterior spurring. Multilevel facet degenerative changes. Mild disc space flattening with anterior spurring at T12-L1 level. IMPRESSION: No acute fracture. Multilevel degenerative changes as described above. There is about 1.3 cm anterolisthesis L5 on S1 vertebral body. Electronically Signed   By: Natasha Mead M.D.   On: 06/06/2015  11:56   Dg Knee Complete 4 Views Left  06/06/2015  CLINICAL DATA:  Left knee pain for 6 months, no known injury EXAM: LEFT KNEE - COMPLETE 4+ VIEW COMPARISON:  None. FINDINGS: Four views of the left knee submitted. Minimal narrowing of medial joint compartment. No acute fracture or subluxation. Small joint effusion. Mild spurring of patella. IMPRESSION: No acute fracture or subluxation.  Mild degenerative changes. Electronically Signed   By: Natasha Mead M.D.   On: 06/06/2015 11:48   Dg Hip Unilat With Pelvis 2-3 Views Left  06/06/2015  CLINICAL DATA:  Left hip and groin pain.  No known injury. EXAM: DG HIP (WITH OR WITHOUT PELVIS) 2-3V LEFT COMPARISON:  None. FINDINGS: There is no evidence of hip fracture or dislocation. Mild degenerative spurring of left hip joint seen without significant joint space narrowing. No evidence of acetabular or pelvic fracture. Mild pubic symphysis degenerative spurring noted as well as mild enthesopathic changes involving the iliac wings and ischial tuberosities. IMPRESSION: No acute findings. Mild degenerative spurring of left hip and pubic symphysis. Electronically Signed   By: Myles Rosenthal M.D.   On: 06/06/2015 11:47   I have personally reviewed and evaluated these images and lab results as part of my medical decision-making.   EKG Interpretation None      MDM   Final diagnoses:  Sciatica associated with disorder of lumbar spine, left   X-rays show degenerative changes lumbar spine. Diagnosis left leg pain sciatica. Patient will be treated with prednisone and Percocet and follow-up with PCP   Bethann Berkshire, MD 06/06/15 1214

## 2015-06-06 NOTE — ED Notes (Signed)
MD at bedside updating patient on results

## 2015-06-06 NOTE — ED Notes (Signed)
Pt reports left leg pain from hip down worsening x 3 days. Pt alert x4. NAD at this time. Pt reports taking extra strength tylenol with no relief.

## 2015-07-06 DIAGNOSIS — N3946 Mixed incontinence: Secondary | ICD-10-CM | POA: Diagnosis not present

## 2015-07-06 DIAGNOSIS — R102 Pelvic and perineal pain: Secondary | ICD-10-CM | POA: Diagnosis not present

## 2015-07-09 DIAGNOSIS — M1712 Unilateral primary osteoarthritis, left knee: Secondary | ICD-10-CM | POA: Diagnosis not present

## 2015-07-09 DIAGNOSIS — M25552 Pain in left hip: Secondary | ICD-10-CM | POA: Diagnosis not present

## 2015-07-16 ENCOUNTER — Emergency Department (EMERGENCY_DEPARTMENT_HOSPITAL): Payer: Medicare Other

## 2015-07-16 ENCOUNTER — Encounter (HOSPITAL_COMMUNITY): Payer: Self-pay | Admitting: Emergency Medicine

## 2015-07-16 ENCOUNTER — Emergency Department (HOSPITAL_COMMUNITY)
Admission: EM | Admit: 2015-07-16 | Discharge: 2015-07-16 | Disposition: A | Discharge status: Home / Self Care | Payer: Medicare Other | Attending: Emergency Medicine | Admitting: Emergency Medicine

## 2015-07-16 DIAGNOSIS — I10 Essential (primary) hypertension: Secondary | ICD-10-CM | POA: Diagnosis not present

## 2015-07-16 DIAGNOSIS — F329 Major depressive disorder, single episode, unspecified: Secondary | ICD-10-CM | POA: Diagnosis not present

## 2015-07-16 DIAGNOSIS — Z7902 Long term (current) use of antithrombotics/antiplatelets: Secondary | ICD-10-CM | POA: Insufficient documentation

## 2015-07-16 DIAGNOSIS — Z9889 Other specified postprocedural states: Secondary | ICD-10-CM | POA: Insufficient documentation

## 2015-07-16 DIAGNOSIS — Z79899 Other long term (current) drug therapy: Secondary | ICD-10-CM | POA: Insufficient documentation

## 2015-07-16 DIAGNOSIS — E785 Hyperlipidemia, unspecified: Secondary | ICD-10-CM | POA: Diagnosis not present

## 2015-07-16 DIAGNOSIS — Z8673 Personal history of transient ischemic attack (TIA), and cerebral infarction without residual deficits: Secondary | ICD-10-CM | POA: Diagnosis not present

## 2015-07-16 DIAGNOSIS — M1990 Unspecified osteoarthritis, unspecified site: Secondary | ICD-10-CM | POA: Diagnosis not present

## 2015-07-16 DIAGNOSIS — Z8719 Personal history of other diseases of the digestive system: Secondary | ICD-10-CM | POA: Insufficient documentation

## 2015-07-16 DIAGNOSIS — M79609 Pain in unspecified limb: Secondary | ICD-10-CM | POA: Diagnosis not present

## 2015-07-16 DIAGNOSIS — M79661 Pain in right lower leg: Secondary | ICD-10-CM | POA: Diagnosis present

## 2015-07-16 DIAGNOSIS — G8929 Other chronic pain: Secondary | ICD-10-CM | POA: Diagnosis not present

## 2015-07-16 DIAGNOSIS — Z87440 Personal history of urinary (tract) infections: Secondary | ICD-10-CM | POA: Insufficient documentation

## 2015-07-16 DIAGNOSIS — I2510 Atherosclerotic heart disease of native coronary artery without angina pectoris: Secondary | ICD-10-CM | POA: Diagnosis not present

## 2015-07-16 NOTE — ED Notes (Signed)
Patient changed into gown and placed on bp, pulse ox, and monitor. Family at bedside.

## 2015-07-16 NOTE — Discharge Instructions (Signed)
Cryotherapy °Cryotherapy means treatment with cold. Ice or gel packs can be used to reduce both pain and swelling. Ice is the most helpful within the first 24 to 48 hours after an injury or flare-up from overusing a muscle or joint. Sprains, strains, spasms, burning pain, shooting pain, and aches can all be eased with ice. Ice can also be used when recovering from surgery. Ice is effective, has very few side effects, and is safe for most people to use. °PRECAUTIONS  °Ice is not a safe treatment option for people with: °· Raynaud phenomenon. This is a condition affecting small blood vessels in the extremities. Exposure to cold may cause your problems to return. °· Cold hypersensitivity. There are many forms of cold hypersensitivity, including: °¨ Cold urticaria. Red, itchy hives appear on the skin when the tissues begin to warm after being iced. °¨ Cold erythema. This is a red, itchy rash caused by exposure to cold. °¨ Cold hemoglobinuria. Red blood cells break down when the tissues begin to warm after being iced. The hemoglobin that carry oxygen are passed into the urine because they cannot combine with blood proteins fast enough. °· Numbness or altered sensitivity in the area being iced. °If you have any of the following conditions, do not use ice until you have discussed cryotherapy with your caregiver: °· Heart conditions, such as arrhythmia, angina, or chronic heart disease. °· High blood pressure. °· Healing wounds or open skin in the area being iced. °· Current infections. °· Rheumatoid arthritis. °· Poor circulation. °· Diabetes. °Ice slows the blood flow in the region it is applied. This is beneficial when trying to stop inflamed tissues from spreading irritating chemicals to surrounding tissues. However, if you expose your skin to cold temperatures for too long or without the proper protection, you can damage your skin or nerves. Watch for signs of skin damage due to cold. °HOME CARE INSTRUCTIONS °Follow  these tips to use ice and cold packs safely. °· Place a dry or damp towel between the ice and skin. A damp towel will cool the skin more quickly, so you may need to shorten the time that the ice is used. °· For a more rapid response, add gentle compression to the ice. °· Ice for no more than 10 to 20 minutes at a time. The bonier the area you are icing, the less time it will take to get the benefits of ice. °· Check your skin after 5 minutes to make sure there are no signs of a poor response to cold or skin damage. °· Rest 20 minutes or more between uses. °· Once your skin is numb, you can end your treatment. You can test numbness by very lightly touching your skin. The touch should be so light that you do not see the skin dimple from the pressure of your fingertip. When using ice, most people will feel these normal sensations in this order: cold, burning, aching, and numbness. °· Do not use ice on someone who cannot communicate their responses to pain, such as small children or people with dementia. °HOW TO MAKE AN ICE PACK °Ice packs are the most common way to use ice therapy. Other methods include ice massage, ice baths, and cryosprays. Muscle creams that cause a cold, tingly feeling do not offer the same benefits that ice offers and should not be used as a substitute unless recommended by your caregiver. °To make an ice pack, do one of the following: °· Place crushed ice or a   bag of frozen vegetables in a sealable plastic bag. Squeeze out the excess air. Place this bag inside another plastic bag. Slide the bag into a pillowcase or place a damp towel between your skin and the bag. °· Mix 3 parts water with 1 part rubbing alcohol. Freeze the mixture in a sealable plastic bag. When you remove the mixture from the freezer, it will be slushy. Squeeze out the excess air. Place this bag inside another plastic bag. Slide the bag into a pillowcase or place a damp towel between your skin and the bag. °SEEK MEDICAL CARE  IF: °· You develop white spots on your skin. This may give the skin a blotchy (mottled) appearance. °· Your skin turns blue or pale. °· Your skin becomes waxy or hard. °· Your swelling gets worse. °MAKE SURE YOU:  °· Understand these instructions. °· Will watch your condition. °· Will get help right away if you are not doing well or get worse. °  °This information is not intended to replace advice given to you by your health care provider. Make sure you discuss any questions you have with your health care provider. °  °Document Released: 04/03/2011 Document Revised: 08/28/2014 Document Reviewed: 04/03/2011 °Elsevier Interactive Patient Education ©2016 [employer] ° °Musculoskeletal Pain °Musculoskeletal pain is muscle and boney aches and pains. These pains can occur in any part of the body. Your caregiver may treat you without knowing the cause of the pain. They may treat you if blood or urine tests, X-rays, and other tests were normal.  °CAUSES °There is often not a definite cause or reason for these pains. These pains may be caused by a type of germ (virus). The discomfort may also come from overuse. Overuse includes working out too hard when your body is not fit. Boney aches also come from weather changes. Bone is sensitive to atmospheric pressure changes. °HOME CARE INSTRUCTIONS  °· Ask when your test results will be ready. Make sure you get your test results. °· Only take over-the-counter or prescription medicines for pain, discomfort, or fever as directed by your caregiver. If you were given medications for your condition, do not drive, operate machinery or power tools, or sign legal documents for 24 hours. Do not drink alcohol. Do not take sleeping pills or other medications that may interfere with treatment. °· Continue all activities unless the activities cause more pain. When the pain lessens, slowly resume normal activities. Gradually increase the intensity and duration of the activities or  exercise. °· During periods of severe pain, bed rest may be helpful. Lay or sit in any position that is comfortable. °· Putting ice on the injured area. °¨ Put ice in a bag. °¨ Place a towel between your skin and the bag. °¨ Leave the ice on for 15 to 20 minutes, 3 to 4 times a day. °· Follow up with your caregiver for continued problems and no reason can be found for the pain. If the pain becomes worse or does not go away, it may be necessary to repeat tests or do additional testing. Your caregiver may need to look further for a possible cause. °SEEK IMMEDIATE MEDICAL CARE IF: °· You have pain that is getting worse and is not relieved by medications. °· You develop chest pain that is associated with shortness or breath, sweating, feeling sick to your stomach (nauseous), or throw up (vomit). °· Your pain becomes localized to the abdomen. °· You develop any new symptoms that seem different or that concern   you. MAKE SURE YOU:   Understand these instructions.  Will watch your condition.  Will get help right away if you are not doing well or get worse.   This information is not intended to replace advice given to you by your health care provider. Make sure you discuss any questions you have with your health care provider.  Follow up with your PCP as needed or if symptoms worsen. Return to the Emergency Department if your leg becomes swollen or discolored, or if you have numbness/tingling in your leg, chest pain or shortness of breath.

## 2015-07-16 NOTE — Progress Notes (Signed)
Preliminary results by tech - Right Lower Ext. Venous Duplex Completed. Negative for deep and superficial vein thrombosis in the right lower extremity. Kiyon Fidalgo, BS, RDMS, RVT  

## 2015-07-16 NOTE — ED Notes (Signed)
Pt. Stated, My left leg started hurting real bad about 3 days ago and I want it checked for a blood clot or something. I have hip pain, leg apin and all the time, but this is different.

## 2015-07-18 NOTE — ED Provider Notes (Signed)
CSN: [id number]     Arrival date & time 07/16/15  1003 History   First MD Initiated Contact with Patient 07/16/15 1122     Chief Complaint  Patient presents with  . Leg Pain     (Consider location/radiation/quality/duration/timing/severity/associated sxs/prior Treatment) HPI   Robin Juarez is an 81 y.o F with a pmhx of CVA, CAD, HLD, HTN, chronic pain, fibromyalgia who presents to the Emergency Department today c/o R calf pain. Pt states that she has chronic pain in both of her legs but this pain feels different. Pain is located in her right calf and has been present for 3 days. Pain is worse with plantar flexion of the foot. Denies chest pain, shortness of breath, lower extremity swelling or discoloration, paresthesias. No history of DVT or PE. No difficulty ambulating.  Past Medical History  Diagnosis Date  . Hyperlipidemia   . GERD (gastroesophageal reflux disease)   . Osteoporosis   . Arthritis   . Fibromyalgia   . UTI (lower urinary tract infection)   . Urinary incontinence   . Depression 1993, 2000  . Blood transfusion without reported diagnosis     37 yrs ago -s/p back surgery  . OSA (obstructive sleep apnea)     dx 2010-had a cpap-does not use-"uses special mouthpiece now"  . H/O hiatal hernia   . Stroke (HCC)   . Hypertension   . Chest pain   . Positive cardiac stress test   . Coronary artery disease     50% mid LAD by cath January 2016   Past Surgical History  Procedure Laterality Date  . Cardiovascular stress test  2009    Normal, Dr. Mayford Knife  . Breast biopsy  1995    x 2  . Abdominal hysterectomy  1976  . Back fusions  1977    missing vertebra  . Childbirth  [phone]  . Ovarian cyst removal  1964  . Appendectomy  1964  . Dilatation and currettage  1964  . Cataract extraction, bilateral Bilateral 2005  . Breast biopsy  07/10/2012    Procedure: BREAST BIOPSY WITH NEEDLE LOCALIZATION;  Surgeon: Emelia Loron, MD;  Location: [facility];  Service: General;  Laterality: Right;  . Cystoscopy N/A 11/25/2012    Procedure: CYSTOSCOPY;  Surgeon: Martina Sinner, MD;  Location: [facility];  Service: Urology;  Laterality: N/A;  . Pubovaginal sling N/A 11/25/2012    Procedure: Leonides Grills;  Surgeon: Martina Sinner, MD;  Location: [facility];  Service: Urology;  Laterality: N/A;  . Back surgery      fusions lumbar area  . Lumbar laminectomy/decompression microdiscectomy N/A 05/01/2013    Procedure: COMPLETE DECOMPRESSIVE LUMBAR LAMINECTOMY L3-L4 MICRODISCECTOMY L3-L4 ON THE LEFT;  Surgeon: Jacki Cones, MD;  Location: [facility];  Service: Orthopedics;  Laterality: N/A;  . Left heart catheterization with coronary angiogram N/A 09/03/2014    Procedure: LEFT HEART CATHETERIZATION WITH CORONARY ANGIOGRAM;  Surgeon: Runell Gess, MD;  Location: [facility];  Service: Cardiovascular;  Laterality: N/A;   Family History  Problem Relation Age of Onset  . Arthritis Mother   . Kidney disease Maternal Grandmother   . Stroke Father   . Hypertension Father   . Heart disease Brother     Died during CABG  . Hypertension Sister   . Dementia Sister   . Aneurysm Sister     Brain  . Parkinsonism Sister   . Colon cancer Neg Hx   .  Breast cancer Neg Hx    Social History  Substance Use Topics  . Smoking status: Never Smoker   . Smokeless tobacco: Never Used     Comment: a small amount when 81 yo  . Alcohol Use: No   OB History    No data available     Review of Systems  All other systems reviewed and are negative.     Allergies  Digoxin; Nitrofurantoin; Alendronate sodium; Simvastatin; Sulfonamide derivatives; Augmentin; Cefaclor; Cephalexin; Chlordiazepoxide; Clavulanic acid; Omeprazole; Oxcarbazepine; Pb-hyoscy-atropine-scopolamine; Streptomycin; Tetracycline; and Zetia  Home Medications   Prior to Admission medications   Medication Sig Start Date End Date Taking?  Authorizing Provider  cholecalciferol (VITAMIN D) 1000 UNITS tablet Take 1,000 Units by mouth daily.     Yes Historical Provider, MD  clopidogrel (PLAVIX) 75 MG tablet TAKE 1 TABLET (75 MG TOTAL) BY MOUTH DAILY. 05/05/15  Yes Joaquim Nam, MD  DULoxetine (CYMBALTA) 60 MG capsule Take 60 mg by mouth every morning.    Yes Historical Provider, MD  folic acid (FOLVITE) 800 MCG tablet Take 800 mcg by mouth daily.    Yes Historical Provider, MD  gabapentin (NEURONTIN) 300 MG capsule Take 300 mg by mouth 4 (four) times daily.    Yes Historical Provider, MD  KRILL OIL OMEGA-3 PO Take 1 capsule by mouth daily.   Yes Historical Provider, MD  lamoTRIgine (LAMICTAL) 100 MG tablet Take 100 mg by mouth at bedtime.   Yes Historical Provider, MD  metoprolol succinate (TOPROL XL) 25 MG 24 hr tablet Take 1 tablet (25 mg total) by mouth daily. 09/15/14  Yes Runell Gess, MD  Multiple Vitamins-Minerals (MULTIVITAMIN PO) Take 1 tablet by mouth daily.   Yes Historical Provider, MD  OLANZapine (ZYPREXA) 2.5 MG tablet Tapering off as of 09/29/14 per Dr. Wynonia Lawman. Patient taking differently: Take 2.5 mg by mouth at bedtime.  09/29/14  Yes Joaquim Nam, MD  oxyCODONE-acetaminophen (PERCOCET) 5-325 MG tablet Take 1 tablet by mouth every 6 (six) hours as needed. Patient taking differently: Take 0.5 tablets by mouth every 6 (six) hours as needed.  06/06/15  Yes Bethann Berkshire, MD  pravastatin (PRAVACHOL) 20 MG tablet Take 1 tablet (20 mg total) by mouth every evening. 03/23/15  Yes Runell Gess, MD  predniSONE (DELTASONE) 10 MG tablet Take 2 tablets (20 mg total) by mouth daily. Patient not taking: Reported on 07/16/2015 06/06/15   Bethann Berkshire, MD   BP 137/68 mmHg  Pulse 69  Temp(Src) 98.2 F (36.8 C) (Oral)  Resp 17  SpO2 95% Physical Exam  Constitutional: She is oriented to person, place, and time. She appears well-developed and well-nourished. No distress.  HENT:  Head: Normocephalic and atraumatic.   Mouth/Throat: No oropharyngeal exudate.  Eyes: Conjunctivae and EOM are normal. Pupils are equal, round, and reactive to light. Right eye exhibits no discharge. Left eye exhibits no discharge. No scleral icterus.  Neck: Neck supple.  Cardiovascular: Normal rate, regular rhythm, normal heart sounds and intact distal pulses.  Exam reveals no gallop and no friction rub.   No murmur heard. Pulmonary/Chest: Effort normal and breath sounds normal. No respiratory distress. She has no wheezes. She has no rales. She exhibits no tenderness.  Abdominal: Soft. She exhibits no distension. There is no tenderness. There is no guarding.  Musculoskeletal: Normal range of motion. She exhibits no edema.  Pain and with plantar flexion of foot. No obvious edema. Calf circumference is equal bilaterally. Intact distal pulses  Lymphadenopathy:  She has no cervical adenopathy.  Neurological: She is alert and oriented to person, place, and time. No cranial nerve deficit.  Strength 5/5 throughout. No sensory deficits.  No gait abnormality.  Skin: Skin is warm and dry. No rash noted. She is not diaphoretic. No erythema. No pallor.  Psychiatric: She has a normal mood and affect. Her behavior is normal.  Nursing note and vitals reviewed.   ED Course  Procedures (including critical care time) Labs Review Labs Reviewed - No data to display  Imaging Review No results found. I have personally reviewed and evaluated these images and lab results as part of my medical decision-making.   EKG Interpretation None      MDM   Final diagnoses:  Calf pain, right    81 year old female with history of chronic pain, fibromyalgia presents for right calf pain 3 days. Patient is concerned there is a blood clot. No hypoxia or tachycardia. Denies chest pain or shortness of breath. No lower extremity swelling on exam. Pain is present with dorsiflexion of right foot. We will Doppler to rule out DVT.  Venous upper negative  for DVT. Suspect muscle cramp. Patient has home pain medication for chronic pain and myalgia that she may take for this. Patient will follow-up with her primary care provider on Monday. Patient is medically stable and ready for discharge. Return precautions outlined in patient discharge instructions.  Patient was discussed with and seen by Dr. Cyndie ChimeNguyen who agrees with the treatment plan.      Lester KinsmanSamantha Tripp LowellDowless, PA-C 07/18/15 1337  Leta BaptistEmily Roe Nguyen, MD 07/23/15 [phone]

## 2015-07-20 DIAGNOSIS — M5416 Radiculopathy, lumbar region: Secondary | ICD-10-CM | POA: Diagnosis not present

## 2015-07-20 DIAGNOSIS — M4316 Spondylolisthesis, lumbar region: Secondary | ICD-10-CM | POA: Diagnosis not present

## 2015-07-20 DIAGNOSIS — M5136 Other intervertebral disc degeneration, lumbar region: Secondary | ICD-10-CM | POA: Diagnosis not present

## 2015-07-21 ENCOUNTER — Telehealth: Payer: Self-pay

## 2015-07-21 NOTE — Telephone Encounter (Signed)
Pt left v/m; Dr Ethelene Halamos is planning to do back injection and pt wants to know if can hold Plavix 3 days prior to back injection. Pt request cb.

## 2015-07-21 NOTE — Telephone Encounter (Signed)
Patient notified as instructed by telephone and verbalized understanding. 

## 2015-07-21 NOTE — Telephone Encounter (Signed)
Yes, okay for 3 days.  Thanks.

## 2015-07-22 ENCOUNTER — Telehealth: Payer: Self-pay | Admitting: Family Medicine

## 2015-07-22 DIAGNOSIS — M1612 Unilateral primary osteoarthritis, left hip: Secondary | ICD-10-CM | POA: Diagnosis not present

## 2015-07-22 NOTE — Telephone Encounter (Signed)
Yes, okay for 3 days.  Thanks.  

## 2015-07-22 NOTE — Telephone Encounter (Signed)
Deanna called to get clearance for Back injection and need pts to come off Plavix for 3 days prior to procedure Schedules 12/20

## 2015-07-23 ENCOUNTER — Encounter: Payer: Self-pay | Admitting: *Deleted

## 2015-07-23 NOTE — Telephone Encounter (Signed)
Left detailed message on voicemail of Robin Juarez.

## 2015-07-29 ENCOUNTER — Other Ambulatory Visit: Payer: Self-pay | Admitting: Family Medicine

## 2015-07-29 DIAGNOSIS — E785 Hyperlipidemia, unspecified: Secondary | ICD-10-CM

## 2015-08-02 ENCOUNTER — Other Ambulatory Visit (INDEPENDENT_AMBULATORY_CARE_PROVIDER_SITE_OTHER): Payer: Medicare Other

## 2015-08-02 DIAGNOSIS — E785 Hyperlipidemia, unspecified: Secondary | ICD-10-CM | POA: Diagnosis not present

## 2015-08-02 LAB — COMPREHENSIVE METABOLIC PANEL
ALBUMIN: 4.1 g/dL (ref 3.5–5.2)
ALK PHOS: 80 U/L (ref 39–117)
ALT: 17 U/L (ref 0–35)
AST: 17 U/L (ref 0–37)
BUN: 13 mg/dL (ref 6–23)
CO2: 33 mEq/L — ABNORMAL HIGH (ref 19–32)
CREATININE: 0.87 mg/dL (ref 0.40–1.20)
Calcium: 9.2 mg/dL (ref 8.4–10.5)
Chloride: 105 mEq/L (ref 96–112)
GFR: 66.23 mL/min (ref >60.00)
GLUCOSE: 96 mg/dL (ref 70–99)
POTASSIUM: 4.1 meq/L (ref 3.5–5.1)
SODIUM: 144 meq/L (ref 135–145)
TOTAL PROTEIN: 6.7 g/dL (ref 6.0–8.3)
Total Bilirubin: 0.6 mg/dL (ref 0.2–1.2)

## 2015-08-02 LAB — LIPID PANEL
CHOLESTEROL: 200 mg/dL (ref 0–200)
HDL: 69 mg/dL (ref >39.00)
LDL Cholesterol: 112 mg/dL — ABNORMAL HIGH (ref 0–99)
NONHDL: 130.9
Total CHOL/HDL Ratio: 3
Triglycerides: 95 mg/dL (ref 0.0–149.0)
VLDL: 19 mg/dL (ref 0.0–40.0)

## 2015-08-04 DIAGNOSIS — R109 Unspecified abdominal pain: Secondary | ICD-10-CM | POA: Diagnosis not present

## 2015-08-04 DIAGNOSIS — R102 Pelvic and perineal pain: Secondary | ICD-10-CM | POA: Diagnosis not present

## 2015-08-05 ENCOUNTER — Encounter: Payer: Self-pay | Admitting: Family Medicine

## 2015-08-05 ENCOUNTER — Ambulatory Visit (INDEPENDENT_AMBULATORY_CARE_PROVIDER_SITE_OTHER): Payer: Medicare Other | Admitting: Family Medicine

## 2015-08-05 VITALS — BP 132/84 | HR 71 | Temp 97.9°F | Ht 61.0 in | Wt 181.5 lb

## 2015-08-05 DIAGNOSIS — E785 Hyperlipidemia, unspecified: Secondary | ICD-10-CM | POA: Diagnosis not present

## 2015-08-05 DIAGNOSIS — I2510 Atherosclerotic heart disease of native coronary artery without angina pectoris: Secondary | ICD-10-CM

## 2015-08-05 DIAGNOSIS — Z0000 Encounter for general adult medical examination without abnormal findings: Secondary | ICD-10-CM

## 2015-08-05 MED ORDER — OLANZAPINE 2.5 MG PO TABS: 2.5000 mg | ORAL_TABLET | Freq: Every day | ORAL | Status: AC

## 2015-08-05 NOTE — Patient Instructions (Signed)
Take care. Glad to see you.   If I can do anything then let me know.

## 2015-08-05 NOTE — Progress Notes (Signed)
Pre visit review using our clinic review tool, if applicable. No additional management support is needed unless otherwise documented below in the visit note.  I have personally reviewed the Medicare Annual Wellness questionnaire and have noted 1. The patient's medical and social history 2. Their use of alcohol, tobacco or illicit drugs 3. Their current medications and supplements 4. The patient's functional ability including ADL's, fall risks, home safety risks and hearing or visual             impairment. 5. Diet and physical activities 6. Evidence for depression or mood disorders  The patients weight, height, BMI have been recorded in the chart and visual acuity is per eye clinic.  I have made referrals, counseling and provided education to the patient based review of the above and I have provided the pt with a written personalized care plan for preventive services.  Provider list updated- see scanned forms.  Routine anticipatory guidance given to patient.  See health maintenance.  Flu 2016 Shingles 2009 PNA 2016 Tetanus 2015 Colonoscopy declined given her age.  This is reasonable.   Breast cancer screening 2016 DXA declined by patient.  Advance directive- husband designated if patient were incapacitated.   Cognitive function addressed- see scanned forms- and if abnormal then additional documentation follows.   Elevated Cholesterol: Using medications without problems:yes, some occ abnormal dreams on med but o/w tolerated.  Muscle aches: at baseline Diet compliance: yes Exercise: as tolerated Labs d/w pt.  Reasonable to treat her with statin/plavix given possible CVA hx.  She agrees.  D/w pt.    PMH and SH reviewed  Meds, vitals, and allergies reviewed.   ROS: See HPI.  Otherwise negative.    GEN: nad, alert and oriented HEENT: mucous membranes moist NECK: supple w/o LA CV: rrr. PULM: ctab, no inc wob ABD: soft, +bs EXT: no edema SKIN: no acute rash

## 2015-08-06 NOTE — Assessment & Plan Note (Signed)
Labs d/w pt.  Reasonable to treat her with statin/plavix given possible CVA hx. She agrees. D/w pt.  Would continue statin at this point.  I am hesitant to inc her dose.  She agrees.

## 2015-08-06 NOTE — Assessment & Plan Note (Signed)
Flu 2016 Shingles 2009 PNA 2016 Tetanus 2015 Colonoscopy declined given her age.  This is reasonable.   Breast cancer screening 2016 DXA declined by patient.  Advance directive- husband designated if patient were incapacitated.   Cognitive function addressed- see scanned forms- and if abnormal then additional documentation follows.

## 2015-08-30 ENCOUNTER — Other Ambulatory Visit: Payer: Self-pay | Admitting: Cardiovascular Disease

## 2015-08-30 NOTE — Telephone Encounter (Signed)
Rx(s) sent to pharmacy electronically.  

## 2015-09-22 ENCOUNTER — Telehealth: Payer: Self-pay | Admitting: Family Medicine

## 2015-09-22 NOTE — Telephone Encounter (Signed)
[facility] called to request medical clearance for pt to come off plavix 5 days before an injection. cb [phone] ext [phone] and ask for Deanna

## 2015-09-23 ENCOUNTER — Encounter: Payer: Self-pay | Admitting: *Deleted

## 2015-09-23 NOTE — Telephone Encounter (Signed)
Deanna advised and asked that we fax a note.  Done.[phone]

## 2015-09-23 NOTE — Telephone Encounter (Signed)
I am okay with coming off plavix for 5 days.  Thanks.

## 2015-10-20 ENCOUNTER — Telehealth: Payer: Self-pay

## 2015-10-20 ENCOUNTER — Encounter: Payer: Self-pay | Admitting: Family Medicine

## 2015-10-20 ENCOUNTER — Ambulatory Visit (INDEPENDENT_AMBULATORY_CARE_PROVIDER_SITE_OTHER): Payer: Medicare Other | Admitting: Family Medicine

## 2015-10-20 VITALS — BP 120/60 | HR 86 | Temp 99.4°F | Wt 185.8 lb

## 2015-10-20 DIAGNOSIS — R6889 Other general symptoms and signs: Secondary | ICD-10-CM | POA: Diagnosis not present

## 2015-10-20 DIAGNOSIS — R059 Cough, unspecified: Secondary | ICD-10-CM

## 2015-10-20 DIAGNOSIS — R05 Cough: Secondary | ICD-10-CM | POA: Diagnosis not present

## 2015-10-20 LAB — POCT INFLUENZA A/B
Influenza A, POC: NEGATIVE
Influenza B, POC: NEGATIVE

## 2015-10-20 MED ORDER — OSELTAMIVIR PHOSPHATE 75 MG PO CAPS: 75.0000 mg | ORAL_CAPSULE | Freq: Two times a day (BID) | ORAL | Status: DC

## 2015-10-20 NOTE — Progress Notes (Signed)
Pre visit review using our clinic review tool, if applicable. No additional management support is needed unless otherwise documented below in the visit note.  Sx started about 2 days ago.  Initially with tickle in throat, then cough.  Some sputum.  No known fevers.  Has been on scheduled tylenol.  No vomiting, no diarrhea.  No ear pain.  Didn't sleep well last night, not from cough but from aches- diffuse aches.  Aches some better with tylenol.  Had a flu shot.  Breathing is still good.  Aches and generally feeling unwell are the most bothersome to patient.  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    Her daughter in law is dying from cancer, on hospice.    Meds, vitals, and allergies reviewed.   ROS: See HPI.  Otherwise, noncontributory.  GEN: nad, alert and oriented HEENT: mucous membranes moist, tm w/o erythema, nasal exam w/o erythema, clear discharge noted,  OP with cobblestoning NECK: supple w/o LA CV: rrr.   PULM: ctab, no inc wob EXT: no edema

## 2015-10-20 NOTE — Patient Instructions (Signed)
Your flu test was negative, but I am still worried that you can have the flu.  I would start tamiflu and keep taking tylenol.  Take care. Glad to see you.  Update me as needed.

## 2015-10-20 NOTE — Assessment & Plan Note (Signed)
Lungs clear, no sign of CAP.  D/w pt.  Flu neg but concern for false neg.  With known exposure, I would treat.  D/w pt.   Start tamiflu.  Supportive care o/w.

## 2015-10-20 NOTE — Telephone Encounter (Signed)
Pt left v/m; pt was seen earlier today and pt forgot to ask if pt can take mucinex with tamiflu; pt has tight congestion in chest. Pt request cb.

## 2015-10-20 NOTE — Telephone Encounter (Signed)
Yes, okay to take.  Thanks.

## 2015-10-20 NOTE — Telephone Encounter (Signed)
Patient notified as instructed by telephone and verbalized understanding. 

## 2015-10-25 ENCOUNTER — Telehealth: Payer: Self-pay

## 2015-10-25 NOTE — Telephone Encounter (Signed)
Pt was seen last week and pt finished tamiflu 10/25/15; pt feels OK now but still has some chest congestion in chest; mostly a dry cough; not coughing alot.  No SOB, wheezing, fever, and no S/T. Pt wants to know if should give more time for chest congestion to go away or what does Dr Para Marchuncan suggest pt takes for chest congestion. [facility]. Pt request cb.

## 2015-10-25 NOTE — Telephone Encounter (Signed)
Patient advised.

## 2015-10-25 NOTE — Telephone Encounter (Signed)
If she can tolerate plain mucinex with plenty of water, then try that.  O/w I would give it more time.   Glad she is some better.  Thanks.

## 2016-01-03 ENCOUNTER — Ambulatory Visit (INDEPENDENT_AMBULATORY_CARE_PROVIDER_SITE_OTHER): Payer: Medicare Other | Admitting: Family Medicine

## 2016-01-03 ENCOUNTER — Encounter: Payer: Self-pay | Admitting: Family Medicine

## 2016-01-03 ENCOUNTER — Ambulatory Visit (INDEPENDENT_AMBULATORY_CARE_PROVIDER_SITE_OTHER)
Admission: RE | Admit: 2016-01-03 | Discharge: 2016-01-03 | Disposition: A | Discharge status: Home / Self Care | Payer: Medicare Other | Source: Ambulatory Visit | Attending: Family Medicine | Admitting: Family Medicine

## 2016-01-03 VITALS — BP 120/64 | HR 77 | Temp 98.2°F | Ht 61.0 in | Wt 185.5 lb

## 2016-01-03 DIAGNOSIS — S52502A Unspecified fracture of the lower end of left radius, initial encounter for closed fracture: Secondary | ICD-10-CM | POA: Diagnosis not present

## 2016-01-03 DIAGNOSIS — S6992XA Unspecified injury of left wrist, hand and finger(s), initial encounter: Secondary | ICD-10-CM

## 2016-01-03 NOTE — Patient Instructions (Signed)

## 2016-01-03 NOTE — Progress Notes (Signed)
Pre visit review using our clinic review tool, if applicable. No additional management support is needed unless otherwise documented below in the visit note. 

## 2016-01-04 NOTE — Progress Notes (Signed)
Dr. Karleen HampshireSpencer T. Aviyah Swetz, MD, CAQ Sports Medicine Primary Care and Sports Medicine [address] [place], [address] Phone: [phone] Fax: [phone]  01/03/2016  Patient: Robin FieldMartha J Picker, MRN: [id number], DOB: [birth year], 82 y.o.  Primary Physician:  Crawford GivensGraham Duncan, MD   Chief Complaint  Patient presents with  . Wrist Pain   Subjective:   Robin FieldMartha J Dufresne is a 82 y.o. very pleasant female patient who presents with the following:  Date of injury Jan 02, 2016.  Pleasant 82 year old female with multiple medical problems noted below including history of  Stroke and balance disturbance who presents after a fall onto her outstretched hand on the left.  She has a significant amount of swelling in the dorsum of her wrist and pain with motion and manipulation of the wrist.  Past Medical History, Surgical History, Social History, Family History, Problem List, Medications, and Allergies have been reviewed and updated if relevant.  Patient Active Problem List   Diagnosis Date Noted  . Flu-like symptoms 10/20/2015  . Leg length discrepancy 11/19/2014  . Dysuria 10/25/2014  . Positive cardiac stress test   . Essential hypertension 08/19/2014  . Atypical chest pain 08/18/2014  . CVA (cerebral infarction) 08/18/2014  . Balance problem 07/14/2014  . Elevated blood pressure (not hypertension) 06/10/2014  . Advance care planning 03/02/2014  . Irritation of eyelid 09/14/2013  . Spinal stenosis, lumbar region, with neurogenic claudication 05/01/2013  . Back pain 04/17/2013  . Paresthesia 08/21/2012  . Tremor 08/21/2012  . OSA (obstructive sleep apnea) 08/07/2012  . Medicare annual wellness visit, subsequent 01/12/2012  . Thumb pain 08/25/2011  . Fatigue 04/27/2011  . HYPERGLYCEMIA, MILD 09/12/2010  . DISORDER OF BONE AND CARTILAGE UNSPECIFIED 05/23/2010  . Anxiety state 03/01/2010  . ESOPHAGEAL SPASM 03/01/2010  . HIATAL HERNIA WITH REFLUX 03/01/2010  . GENERALIZED  OSTEOARTHROSIS UNSPECIFIED SITE 03/01/2010  . RHEUMATISM UNSPECIFIED AND FIBROSITIS 03/01/2010  . UNSPECIFIED MYALGIA AND MYOSITIS 03/01/2010  . OSTEOPOROSIS 03/01/2010  . HLD (hyperlipidemia) 02/25/2010  . MDD (major depressive disorder) (HCC) 02/25/2010  . GERD 02/25/2010  . URINARY INCONTINENCE 02/25/2010  . UTI'S, HX OF 02/25/2010    Past Medical History  Diagnosis Date  . Hyperlipidemia   . GERD (gastroesophageal reflux disease)   . Osteoporosis   . Fibromyalgia   . UTI (lower urinary tract infection)   . Urinary incontinence   . Depression 1993, 2000  . Blood transfusion without reported diagnosis     37 yrs ago -s/p back surgery  . OSA (obstructive sleep apnea)     dx 2010-had a cpap-does not use-"uses special mouthpiece now"  . H/O hiatal hernia   . Stroke (HCC)   . Hypertension   . Chest pain   . Positive cardiac stress test   . Coronary artery disease     50% mid LAD by cath January 2016  . Arthritis     knee and hip OA- prev injection by Dr. Ethelene Halamos    Past Surgical History  Procedure Laterality Date  . Cardiovascular stress test  2009    Normal, Dr. Mayford Knifeurner  . Breast biopsy  1995    x 2  . Abdominal hysterectomy  1976  . Back fusions  1977    missing vertebra  . Childbirth  [phone],1956,1957,1960  . Ovarian cyst removal  1964  . Appendectomy  1964  . Dilatation and currettage  1964  . Cataract extraction, bilateral Bilateral 2005  . Breast biopsy  07/10/2012    Procedure:  BREAST BIOPSY WITH NEEDLE LOCALIZATION;  Surgeon: Emelia Loron, MD;  Location: [facility];  Service: General;  Laterality: Right;  . Cystoscopy N/A 11/25/2012    Procedure: CYSTOSCOPY;  Surgeon: Martina Sinner, MD;  Location: [facility];  Service: Urology;  Laterality: N/A;  . Pubovaginal sling N/A 11/25/2012    Procedure: Leonides Grills;  Surgeon: Martina Sinner, MD;  Location: [facility];  Service: Urology;  Laterality: N/A;  .  Back surgery      fusions lumbar area  . Lumbar laminectomy/decompression microdiscectomy N/A 05/01/2013    Procedure: COMPLETE DECOMPRESSIVE LUMBAR LAMINECTOMY L3-L4 MICRODISCECTOMY L3-L4 ON THE LEFT;  Surgeon: Jacki Cones, MD;  Location: [facility];  Service: Orthopedics;  Laterality: N/A;  . Left heart catheterization with coronary angiogram N/A 09/03/2014    Procedure: LEFT HEART CATHETERIZATION WITH CORONARY ANGIOGRAM;  Surgeon: Runell Gess, MD;  Location: [facility];  Service: Cardiovascular;  Laterality: N/A;    Social History   Social History  . Marital Status: Married    Spouse Name: N/A  . Number of Children: 4  . Years of Education: N/A   Occupational History  . RETIRED    Social History Main Topics  . Smoking status: Never Smoker   . Smokeless tobacco: Never Used     Comment: a small amount when 82 yo  . Alcohol Use: No  . Drug Use: No  . Sexual Activity: Not Currently   Other Topics Concern  . Not on file   Social History Narrative   Married 1953, husband is well.   5 pregnancies, 4 live births, all well (5th was unexpect pregnancy and patient needed to terminate due to concurrent medical problems at the time)   11 grandchildren.  3 great grandchildren   Regular exercise:  No    Family History  Problem Relation Age of Onset  . Arthritis Mother   . Kidney disease Maternal Grandmother   . Stroke Father   . Hypertension Father   . Heart disease Brother     Died during CABG  . Hypertension Sister   . Dementia Sister   . Aneurysm Sister     Brain  . Parkinsonism Sister   . Colon cancer Neg Hx   . Breast cancer Neg Hx     Allergies  Allergen Reactions  . Digoxin Other (See Comments)    Felt weak, tired, "aweful"  . Nitrofurantoin Other (See Comments)    Sharp pain back of head  . Alendronate Sodium Other (See Comments)    cramps  . Simvastatin Other (See Comments)    Myalgia, possible intolerance  . Sulfonamide Derivatives Other (See  Comments)    REACTION: (Maybe)-neck pain-brief  . Augmentin [Amoxicillin-Pot Clavulanate] Rash    In mouth  . Cefaclor Other (See Comments)    Felt bad  . Cephalexin Other (See Comments)    Felt bad, but able to tolerate amoxil prev  . Chlordiazepoxide Other (See Comments)    REACTION: (Maybe)  . Clavulanic Acid Other (See Comments)    Not an allergy.  Tolerates plain amoxil but not augmentin- diarrhea, etc with augmentin  . Omeprazole Rash    rash  . Oxcarbazepine Other (See Comments)    unknown  . Pb-Hyoscy-Atropine-Scopolamine Other (See Comments)    Unsure-doesn't remember  . Streptomycin Other (See Comments)    Lips itched  . Tetracycline Itching    itching  . Zetia [Ezetimibe] Other (See Comments)  possible cause of aches and abnormal dreams, stopped 05/2014    Medication list reviewed and updated in full in [facility].  GEN: No fevers, chills. Nontoxic. Primarily MSK c/o today. MSK: Detailed in the HPI GI: tolerating PO intake without difficulty Neuro: No numbness, parasthesias, or tingling associated. Otherwise the pertinent positives of the ROS are noted above.   Objective:   BP 120/64 mmHg  Pulse 77  Temp(Src) 98.2 F (36.8 C) (Oral)  Ht  (1.549 m)  Wt 185 lb 8 oz (84.142 kg)  BMI 35.07 kg/m2   GEN: WDWN, NAD, Non-toxic, Alert & Oriented x 3 HEENT: Atraumatic, Normocephalic.  Ears and Nose: No external deformity. EXTR: No clubbing/cyanosis/edema NEURO: Normal gait.  PSYCH: Normally interactive. Conversant. Not depressed or anxious appearing.  Calm demeanor.   Hand: LEFT ROM wrist/hand/digits/elbow: dorsal swelling radially, restricted rom at wrist Carpals, MCP's, digits: carpal region less ttp Distal Ulna and Radius: NT Supination lift test: neg Cysts/nodules: neg Finkelstein's test: neg Snuffbox tenderness: neg Scaphoid tubercle: NT Hook of Hamate: NT Resisted supination: NT Full composite fist Grip, all digits: 3+/5 str on L No  tenosynovitis Atrophy: neg  Hand sensation: intact   Radiology: Dg Wrist Complete Left  01/03/2016  CLINICAL DATA:  Initial encounter for Left wrist pain, and swelling after recently FOOSH, no priors. EXAM: LEFT WRIST - COMPLETE 3+ VIEW COMPARISON:  None. FINDINGS: Diffuse soft tissue swelling. Minimally displaced fracture involves the distal radius, with intra-articular extension about the ulnar side of the radiocarpal articulation. Favor remote ulnar styloid fracture, with well corticated margins. Scaphoid and remainder of carpal bones intact. Expected for age degenerative change at the base the thumb. IMPRESSION: Minimally displaced intra-articular fracture of the distal radius. Likely remote ulnar styloid fracture. Electronically Signed   By: Jeronimo Greaves M.D.   On: 01/03/2016 17:17    Assessment and Plan:   Distal radius fracture, left, closed, initial encounter - Plan: Ambulatory referral to Orthopedic Surgery  Left wrist injury, initial encounter - Plan: DG Wrist Complete Left, Ambulatory referral to Orthopedic Surgery  Minimally displaced distal radius fracture with involvement into the intra-articular wrist base on the ulnar side of the joint.  Agree that the radiopacity seen adjacent to the ulna is likely chronic in nature and is nontender clinically.  She was placed in a forearm immobilizer.  Discussed all with the patient, and I'm going to have her follow-up with hand surgery later in the week for definitive management.  Follow-up: Hand  Orders Placed This Encounter  Procedures  . DG Wrist Complete Left  . Ambulatory referral to Orthopedic Surgery    Signed,  Karleen Hampshire T. Laryssa Hassing, MD   Patient's Medications  New Prescriptions   No medications on file  Previous Medications   CHOLECALCIFEROL (VITAMIN D) 1000 UNITS TABLET    Take 1,000 Units by mouth daily.     CLOPIDOGREL (PLAVIX) 75 MG TABLET    TAKE 1 TABLET (75 MG TOTAL) BY MOUTH DAILY.   COENZYME Q10 (CO Q 10 PO)     Take 1 tablet by mouth daily.   DULOXETINE (CYMBALTA) 30 MG CAPSULE    Take 30 mg by mouth daily.   DULOXETINE (CYMBALTA) 60 MG CAPSULE    Take 60 mg by mouth daily.   FOLIC ACID (FOLVITE) 800 MCG TABLET    Take 800 mcg by mouth daily.    GABAPENTIN (NEURONTIN) 300 MG CAPSULE    Take 300 mg by mouth 4 (four) times daily.  KRILL OIL OMEGA-3 PO    Take 1 capsule by mouth daily.   LAMOTRIGINE (LAMICTAL) 100 MG TABLET    Take 100 mg by mouth at bedtime.   METOPROLOL SUCCINATE (TOPROL-XL) 25 MG 24 HR TABLET    TAKE 1 TABLET (25 MG TOTAL) BY MOUTH DAILY.   MULTIPLE VITAMINS-MINERALS (MULTIVITAMIN PO)    Take 1 tablet by mouth daily.   OLANZAPINE (ZYPREXA) 2.5 MG TABLET    Take 1 tablet (2.5 mg total) by mouth at bedtime.   PRAVASTATIN (PRAVACHOL) 20 MG TABLET    Take 1 tablet (20 mg total) by mouth every evening.  Modified Medications   No medications on file  Discontinued Medications   OSELTAMIVIR (TAMIFLU) 75 MG CAPSULE    Take 1 capsule (75 mg total) by mouth 2 (two) times daily.

## 2016-02-23 ENCOUNTER — Other Ambulatory Visit: Payer: Self-pay | Admitting: Cardiovascular Disease

## 2016-02-23 NOTE — Telephone Encounter (Signed)
Rx Refill

## 2016-03-02 ENCOUNTER — Encounter: Payer: Self-pay | Admitting: Family Medicine

## 2016-03-02 ENCOUNTER — Ambulatory Visit (INDEPENDENT_AMBULATORY_CARE_PROVIDER_SITE_OTHER): Payer: Medicare Other | Admitting: Family Medicine

## 2016-03-02 VITALS — BP 122/88 | HR 83 | Temp 98.5°F | Wt 182.8 lb

## 2016-03-02 DIAGNOSIS — I639 Cerebral infarction, unspecified: Secondary | ICD-10-CM | POA: Diagnosis not present

## 2016-03-02 DIAGNOSIS — R239 Unspecified skin changes: Secondary | ICD-10-CM

## 2016-03-02 NOTE — Progress Notes (Signed)
Pre visit review using our clinic review tool, if applicable. No additional management support is needed unless otherwise documented below in the visit note.  She has a possible h/o CVA.  She wanted to know about possibly changing plavix to ASA.  No ADE on plavix.  I told her I would get back to her.    She has some B medial lower leg/upper ankle swelling with some burning and stinging.  On B legs.  Noted more when sitting in recliner with her feet elevated.  She didn't notice a change with change in shoes.  No bruising.    She feels well o/w.  No FCNAVD.    Meds, vitals, and allergies reviewed.   ROS: Per HPI unless specifically indicated in ROS section   nad ncat rrr ctab B foot exam wnl- normal DP pulse, no bruising, sensation and motor function intact Ankles not ttp Some puffiness w/o pitting edema on distal medial lower leg, possibly just fatty accumulation in the area.

## 2016-03-02 NOTE — Patient Instructions (Signed)
I think the ankle changes are more likely from compression.  Work on [employer] Holdingsyour diet and put a pillow under your feet when in the recliner.  See if that helps.  Let me check options on aspirin vs plavix.   Take care.  Glad to see you.

## 2016-03-03 DIAGNOSIS — R239 Unspecified skin changes: Secondary | ICD-10-CM | POA: Insufficient documentation

## 2016-03-03 NOTE — Assessment & Plan Note (Signed)
She has a possible h/o CVA. She wanted to know about possibly changing plavix to ASA. No ADE on plavix. I told her I would get back to her.

## 2016-03-03 NOTE — Assessment & Plan Note (Signed)
Some puffiness w/o pitting edema on distal medial lower leg, possibly just fatty accumulation in the area.   Possible that she has positional/mechanical compression of local cutaneous nerve.  Will use a pillow when sitting in recliner and she'll see if that makes a difference.  She agrees.  No distal motor loss.  Distal sensation wnl. I don't suspect metabolic cause for a neuropathy.

## 2016-03-05 ENCOUNTER — Telehealth: Payer: Self-pay | Admitting: Family Medicine

## 2016-03-05 NOTE — Telephone Encounter (Signed)
Notify pt.  I looked back at options.  It appears that plavix would still be the best option for her treatment, instead of aspirin.   I would continue with the plavix as is unless she had a compelling reason to stop (ie allergy to med, etc). Thanks.

## 2016-03-06 NOTE — Telephone Encounter (Signed)
Patient advised.

## 2016-03-24 IMAGING — DX DG KNEE COMPLETE 4+V*L*
4 series · 4 of 4 positions shown · non-contrast
Comparison: None.

CLINICAL DATA: Left knee pain for 6 months, no known injury

EXAM:
LEFT KNEE - COMPLETE 4+ VIEW

[knee ap]
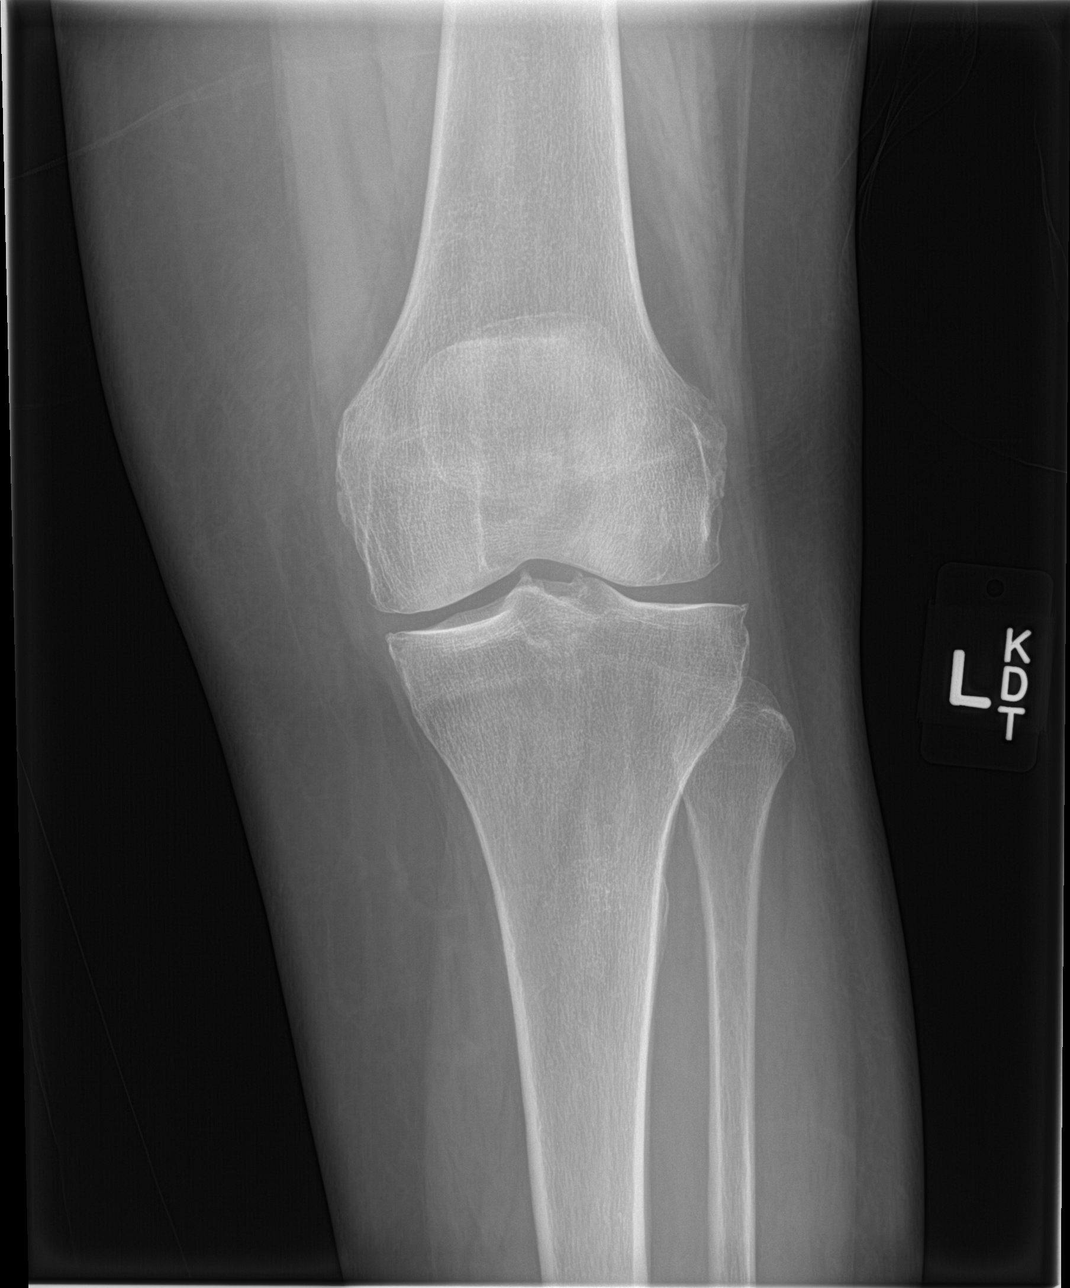

[knee lat]
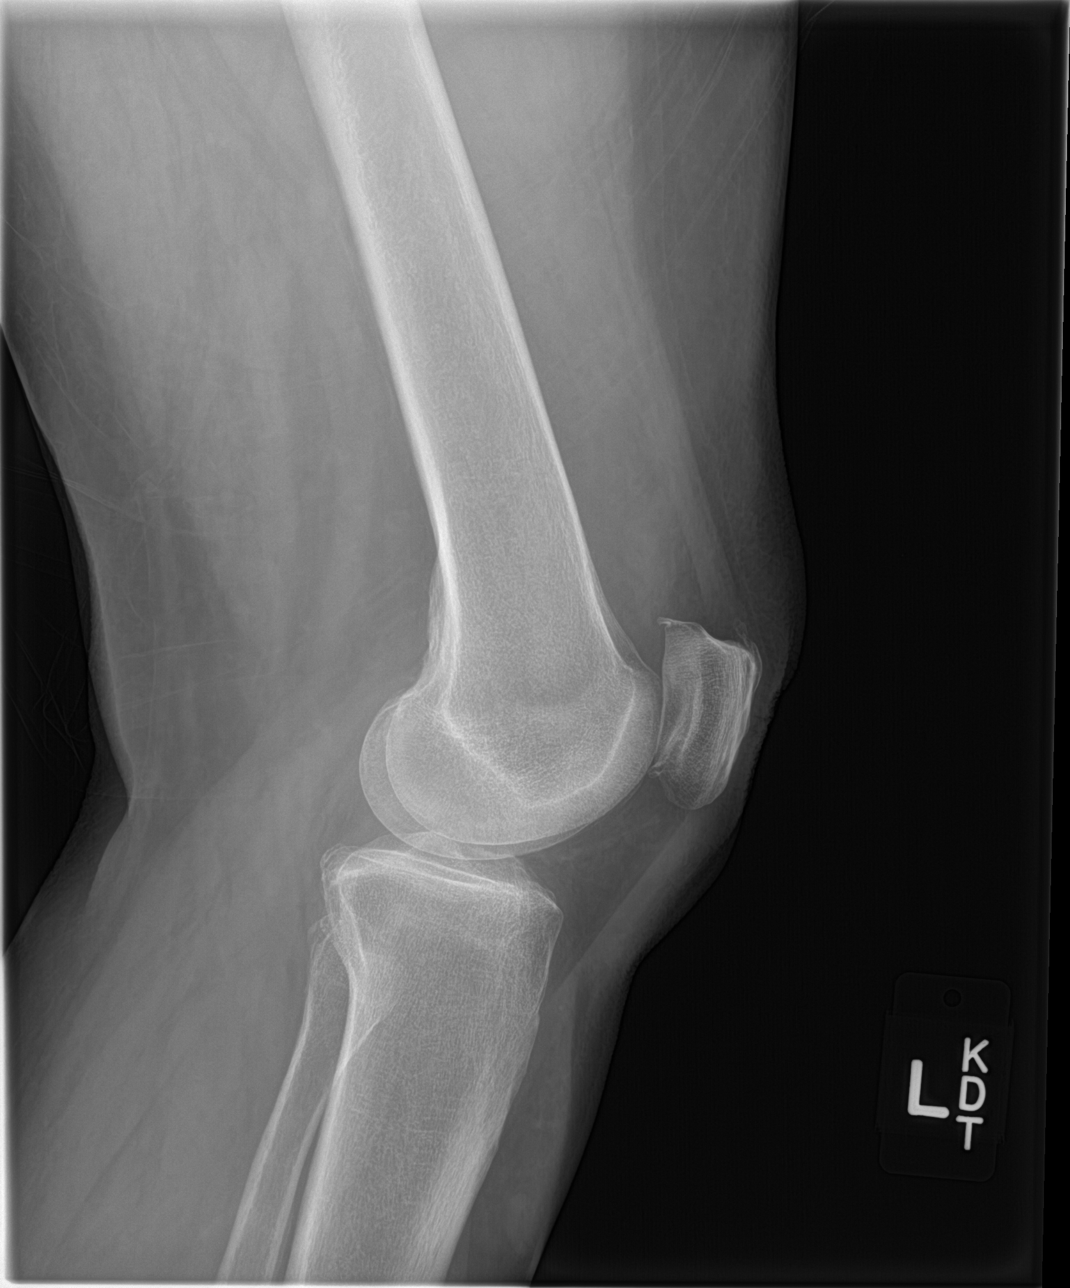

[knee obl (1 of 2)]
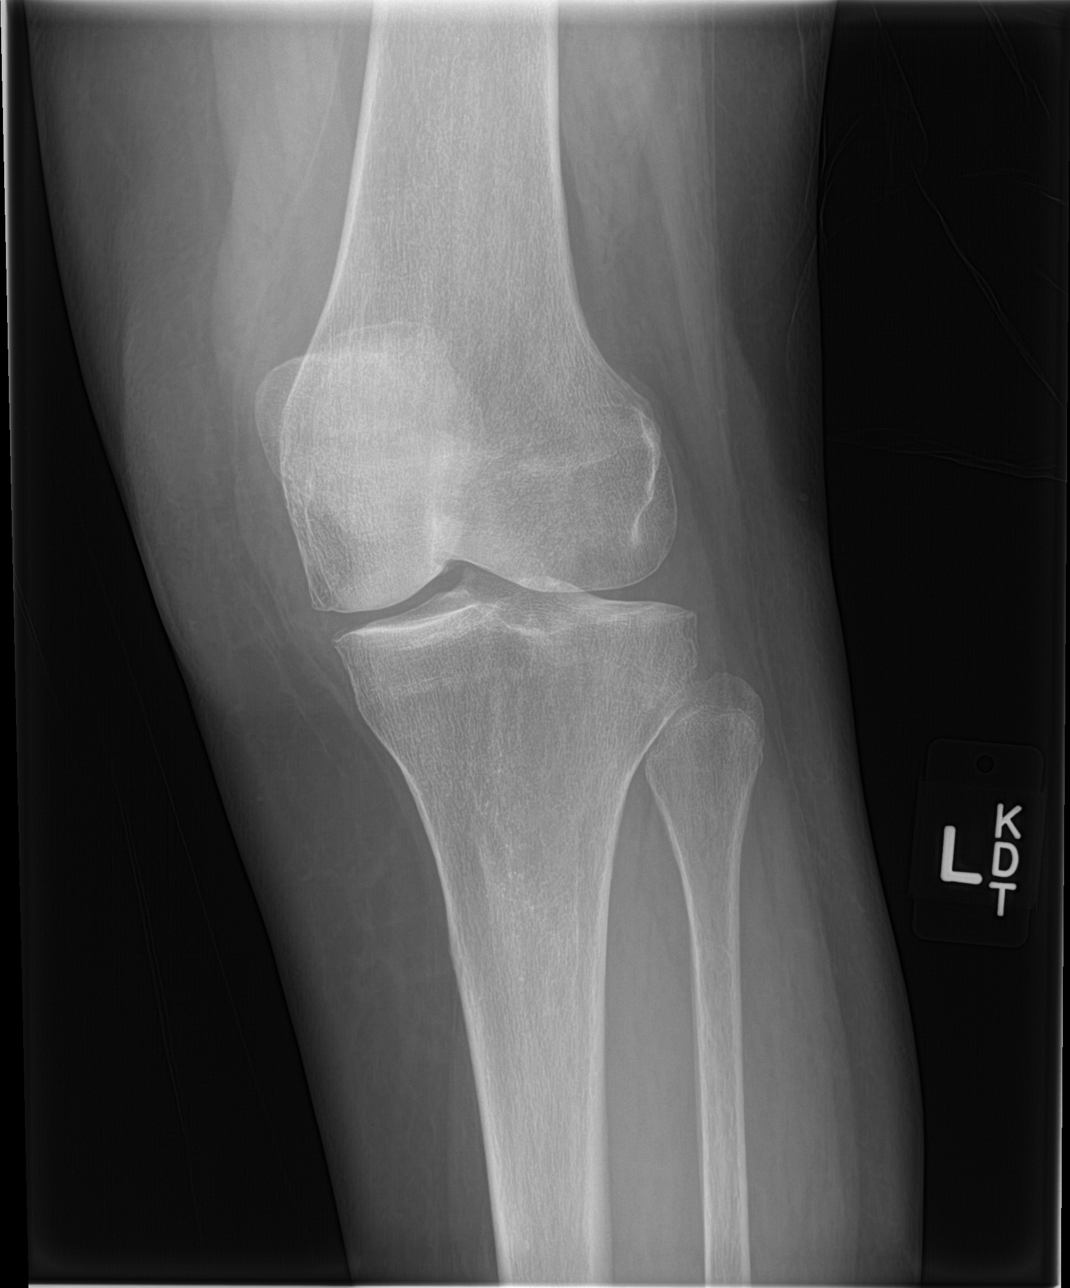

[knee obl (2 of 2)]
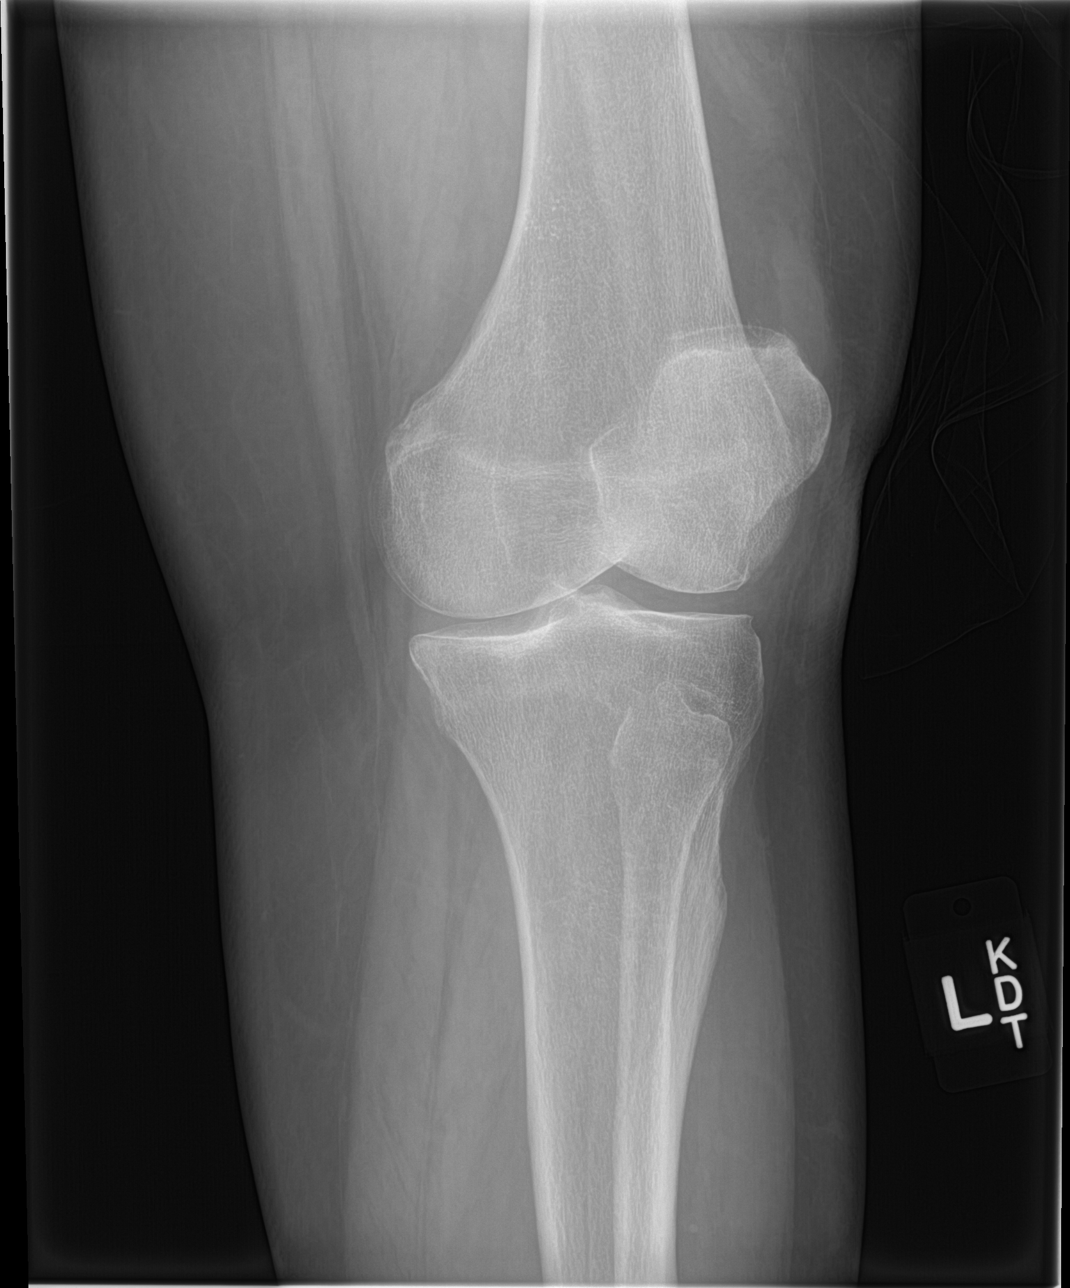

[4 of 4 positions shown; findings below may reference images not displayed]

FINDINGS: Four views of the left knee submitted. Minimal narrowing of medial
joint compartment. No acute fracture or subluxation. Small joint
effusion. Mild spurring of patella.
IMPRESSION: No acute fracture or subluxation.  Mild degenerative changes.

## 2016-04-04 ENCOUNTER — Encounter: Payer: Self-pay | Admitting: Cardiovascular Disease

## 2016-04-04 ENCOUNTER — Encounter (INDEPENDENT_AMBULATORY_CARE_PROVIDER_SITE_OTHER): Payer: Self-pay

## 2016-04-04 ENCOUNTER — Ambulatory Visit (INDEPENDENT_AMBULATORY_CARE_PROVIDER_SITE_OTHER): Payer: Medicare Other | Admitting: Cardiovascular Disease

## 2016-04-04 VITALS — BP 142/80 | HR 67 | Ht 61.5 in | Wt 185.0 lb

## 2016-04-04 DIAGNOSIS — I10 Essential (primary) hypertension: Secondary | ICD-10-CM

## 2016-04-04 DIAGNOSIS — Z79899 Other long term (current) drug therapy: Secondary | ICD-10-CM

## 2016-04-04 DIAGNOSIS — E785 Hyperlipidemia, unspecified: Secondary | ICD-10-CM

## 2016-04-04 MED ORDER — PRAVASTATIN SODIUM 40 MG PO TABS
40.0000 mg | ORAL_TABLET | Freq: Every evening | ORAL | 3 refills | Status: DC
Start: 2016-04-04 — End: 2016-11-10

## 2016-04-04 NOTE — Patient Instructions (Signed)
Medication Instructions:  Your physician has recommended you make the following change in your medication:  1- INCREASE Pravastatin to 40mg  by mouth once a day.   Labwork: Your physician recommends that you return for lab work in 2-3 months. YOU WILL NEED TO BE FASTING, DO NOT EAT OR DRINK PAST MIDNIGHT.   This will be somewhere between June 04, 2016 to July 05, 2016.   Follow-Up: Your physician wants you to follow-up in: 12 MONTHS WITH DR Allyson SabalBERRY. You will receive a reminder letter in the mail two months in advance. If you don't receive a letter, please call our office to schedule the follow-up appointment.  If you need a refill on your cardiac medications before your next appointment, please call your pharmacy.

## 2016-04-04 NOTE — Assessment & Plan Note (Signed)
History of hyperlipidemia on pravastatin 20 mg a day. When last checked 08/02/15 her LDL was 112. She is not at goal with known moderate CAD. I'm going to increase her pravastatin to 40 mg daily and will recheck a lipid and liver profile in 2-3 months. We did talk about a heart healthy diet with and she admits to dietary indiscretion.

## 2016-04-04 NOTE — Assessment & Plan Note (Signed)
History of hypertension blood pressure 142/80. She is on metoprolol. Continue current meds at current dosing.

## 2016-04-04 NOTE — Assessment & Plan Note (Signed)
History of atypical chest pain with a Myoview stress test that showed subtle anteroapical ischemia. Performed cardiac catheterization on her 09/03/14 through the right femoral approach revealing at most 50% mid LAD stenosis after a moderate moderate size diagonal branch otherwise normal coronary arteries and normal LV function. I felt her chest pain was noncardiac. She's had no recurrent symptoms.

## 2016-04-04 NOTE — Progress Notes (Signed)
04/04/2016 Robin Juarez   1930/12/22  [id number]  Primary Physician Robin GivensGraham Duncan, Juarez Primary Cardiologist: Robin GessJonathan J Laranda Burkemper Juarez Robin Juarez, FACC, FAHA, [place]  HPI:  Robin Juarez is an 82 year old moderately overweight married Caucasian female mother of 4 children, grandmother of 6811 grandchildren who self-referred for evaluation of chest pain. I last saw her in the office 03/23/15.Her primary care physician is Dr. Crawford GivensGraham Juarez. She took up in by one of her daughters Robin Juarez who I know. Her cardiac risk factors are notable for family history with brother does have coronary artery bypass grafting. She probably does have hyperlipidemia and hypertension which is not treated. She began having chest pain 2 weeks ago which is left precordial and radiates to her back. There are no other associated symptoms. She also initially had strokelike symptoms and had an MRI that showed a subacute stroke in the corona radiata. She had a Myoview stress test that was low risk but that show subtle anteroapical ischemia. I performed cardiac catheterization on her 09/03/14 through the right femoral approach revealing at most 50% mid LAD stenosis after a moderate sized diagonal branch with otherwise normal coronary arteries and normal LV function. I thought her chest pain was noncardiac. Since I saw her in the office a year ago she's remained clinically stable. She denies chest pain or shortness of breath.   Current Outpatient Prescriptions  Medication Sig Dispense Refill  . cholecalciferol (VITAMIN D) 1000 UNITS tablet Take 1,000 Units by mouth daily.      . clopidogrel (PLAVIX) 75 MG tablet TAKE 1 TABLET (75 MG TOTAL) BY MOUTH DAILY. 90 tablet 3  . Coenzyme Q10 (CO Q 10 PO) Take 1 tablet by mouth daily.    . DULoxetine (CYMBALTA) 30 MG capsule Take 30 mg by mouth daily.    . DULoxetine (CYMBALTA) 60 MG capsule Take 60 mg by mouth daily.    . folic acid (FOLVITE) 800 MCG tablet Take 800 mcg by mouth daily.       [name]. gabapentin (NEURONTIN) 300 MG capsule Take 300 mg by mouth 4 (four) times daily.     [name]. lamoTRIgine (LAMICTAL) 100 MG tablet Take 100 mg by mouth at bedtime.    . metoprolol succinate (TOPROL-XL) 25 MG 24 hr tablet TAKE 1 TABLET (25 MG TOTAL) BY MOUTH DAILY. 90 tablet 3  . Multiple Vitamins-Minerals (MULTIVITAMIN PO) Take 1 tablet by mouth daily.    [name]. OLANZapine (ZYPREXA) 2.5 MG tablet Take 1 tablet (2.5 mg total) by mouth at bedtime.    [name]. oxyCODONE-acetaminophen (PERCOCET/ROXICET) 5-325 MG tablet Take 1 tablet by mouth as directed.  0  . pravastatin (PRAVACHOL) 20 MG tablet TAKE 1 TABLET (20 MG TOTAL) BY MOUTH EVERY EVENING. 90 tablet 3   No current facility-administered medications for this visit.     Allergies  Allergen Reactions  . Digoxin Other (See Comments)    Felt weak, tired, "aweful"  . Nitrofurantoin Other (See Comments)    Sharp pain back of head  . Alendronate Sodium Other (See Comments)    cramps  . Simvastatin Other (See Comments)    Myalgia, possible intolerance  . Sulfonamide Derivatives Other (See Comments)    REACTION: (Maybe)-neck pain-brief  . Augmentin [Amoxicillin-Pot Clavulanate] Rash    In mouth  . Cefaclor Other (See Comments)    Felt bad  . Cephalexin Other (See Comments)    Felt bad, but able to tolerate amoxil prev  . Chlordiazepoxide Other (See Comments)  REACTION: (Maybe)  . Clavulanic Acid Other (See Comments)    Not an allergy.  Tolerates plain amoxil but not augmentin- diarrhea, etc with augmentin  . Omeprazole Rash    rash  . Oxcarbazepine Other (See Comments)    unknown  . Pb-Hyoscy-Atropine-Scopolamine Other (See Comments)    Unsure-doesn't remember  . Streptomycin Other (See Comments)    Lips itched  . Tetracycline Itching    itching  . Zetia [Ezetimibe] Other (See Comments)    possible cause of aches and abnormal dreams, stopped 05/2014    Social History   Social History  . Marital status: Married    Spouse name: N/A  .  Number of children: 4  . Years of education: N/A   Occupational History  . RETIRED Retired   Social History Main Topics  . Smoking status: Never Smoker  . Smokeless tobacco: Never Used     Comment: a small amount when 82 yo  . Alcohol use No  . Drug use: No  . Sexual activity: Not Currently   Other Topics Concern  . Not on file   Social History Narrative   Married 1953, husband is well.   5 pregnancies, 4 live births, all well (5th was unexpect pregnancy and patient needed to terminate due to concurrent medical problems at the time)   11 grandchildren.  3 great grandchildren   Regular exercise:  No     Review of Systems: General: negative for chills, fever, night sweats or weight changes.  Cardiovascular: negative for chest pain, dyspnea on exertion, edema, orthopnea, palpitations, paroxysmal nocturnal dyspnea or shortness of breath Dermatological: negative for rash Respiratory: negative for cough or wheezing Urologic: negative for hematuria Abdominal: negative for nausea, vomiting, diarrhea, bright red blood per rectum, melena, or hematemesis Neurologic: negative for visual changes, syncope, or dizziness All other systems reviewed and are otherwise negative except as noted above.    Blood pressure (!) 142/80, pulse 67, height 5' 1.5" (1.562 m), weight 185 lb (83.9 kg).  General appearance: alert and no distress Neck: no adenopathy, no carotid bruit, no JVD, supple, symmetrical, trachea midline and thyroid not enlarged, symmetric, no tenderness/mass/nodules Lungs: clear to auscultation bilaterally Heart: regular rate and rhythm, S1, S2 normal, no murmur, click, rub or gallop Extremities: extremities normal, atraumatic, no cyanosis or edema  EKG normal sinus rhythm at 67 without ST or T-wave changes. I personally reviewed this EKG  ASSESSMENT AND PLAN:   HLD (hyperlipidemia) History of hyperlipidemia on pravastatin 20 mg a day. When last checked 08/02/15 her LDL was  112. She is not at goal with known moderate CAD. I'm going to increase her pravastatin to 40 mg daily and will recheck a lipid and liver profile in 2-3 months. We did talk about a heart healthy diet with and she admits to dietary indiscretion.  Essential hypertension History of hypertension blood pressure 142/80. She is on metoprolol. Continue current meds at current dosing.  Atypical chest pain History of atypical chest pain with a Myoview stress test that showed subtle anteroapical ischemia. Performed cardiac catheterization on her 09/03/14 through the right femoral approach revealing at most 50% mid LAD stenosis after a moderate moderate size diagonal branch otherwise normal coronary arteries and normal LV function. I felt her chest pain was noncardiac. She's had no recurrent symptoms.      Robin GessJonathan J. Alexandre Faries Juarez FACP,FACC,FAHA, [facility]SC[facility] 04/04/2016 12:24 PM

## 2016-04-25 ENCOUNTER — Other Ambulatory Visit: Payer: Self-pay | Admitting: Family Medicine

## 2016-05-23 ENCOUNTER — Ambulatory Visit (INDEPENDENT_AMBULATORY_CARE_PROVIDER_SITE_OTHER): Payer: Medicare Other

## 2016-05-23 ENCOUNTER — Ambulatory Visit: Payer: Medicare Other

## 2016-05-23 DIAGNOSIS — Z23 Encounter for immunization: Secondary | ICD-10-CM

## 2016-06-30 ENCOUNTER — Other Ambulatory Visit: Payer: Self-pay | Admitting: Family Medicine

## 2016-06-30 DIAGNOSIS — E7849 Other hyperlipidemia: Secondary | ICD-10-CM

## 2016-06-30 DIAGNOSIS — Z79899 Other long term (current) drug therapy: Secondary | ICD-10-CM

## 2016-06-30 DIAGNOSIS — I10 Essential (primary) hypertension: Secondary | ICD-10-CM

## 2016-07-03 ENCOUNTER — Other Ambulatory Visit (INDEPENDENT_AMBULATORY_CARE_PROVIDER_SITE_OTHER): Payer: Medicare Other

## 2016-07-03 DIAGNOSIS — Z79899 Other long term (current) drug therapy: Secondary | ICD-10-CM

## 2016-07-03 DIAGNOSIS — E7849 Other hyperlipidemia: Secondary | ICD-10-CM

## 2016-07-03 DIAGNOSIS — E784 Other hyperlipidemia: Secondary | ICD-10-CM

## 2016-07-03 DIAGNOSIS — I10 Essential (primary) hypertension: Secondary | ICD-10-CM | POA: Diagnosis not present

## 2016-07-03 LAB — HEPATIC FUNCTION PANEL
ALK PHOS: 72 U/L (ref 39–117)
ALT: 18 U/L (ref 0–35)
AST: 21 U/L (ref 0–37)
Albumin: 4.3 g/dL (ref 3.5–5.2)
BILIRUBIN DIRECT: 0.1 mg/dL (ref 0.0–0.3)
TOTAL PROTEIN: 6.9 g/dL (ref 6.0–8.3)
Total Bilirubin: 0.6 mg/dL (ref 0.2–1.2)

## 2016-07-03 LAB — LIPID PANEL
CHOLESTEROL: 199 mg/dL (ref 0–200)
HDL: 63.2 mg/dL (ref >39.00)
LDL CALC: 110 mg/dL — AB (ref 0–99)
NonHDL: 135.37
TRIGLYCERIDES: 125 mg/dL (ref 0.0–149.0)
Total CHOL/HDL Ratio: 3
VLDL: 25 mg/dL (ref 0.0–40.0)

## 2016-07-17 ENCOUNTER — Telehealth: Payer: Self-pay

## 2016-07-17 NOTE — Telephone Encounter (Signed)
Pt left v/m requesting 07/03/16 labs faxed to Dr Lynden AngMasoud Hejazi fax # [phone]. Per DPR left detailed v/m done.

## 2016-07-23 ENCOUNTER — Telehealth: Payer: Self-pay | Admitting: Family Medicine

## 2016-07-23 DIAGNOSIS — I6529 Occlusion and stenosis of unspecified carotid artery: Secondary | ICD-10-CM

## 2016-07-23 NOTE — Telephone Encounter (Signed)
Call patient. Previous carotid ultrasound done about 2 years ago. Reasonable to consider repeat ultrasound this point. I put in the order. This is assuming she will will go through with the testing. If she declines the test I can cancel it. Thanks.

## 2016-07-24 NOTE — Telephone Encounter (Signed)
Patient advised.

## 2016-08-20 ENCOUNTER — Other Ambulatory Visit: Payer: Self-pay | Admitting: Cardiovascular Disease

## 2016-08-22 NOTE — Telephone Encounter (Signed)
Rx request sent to pharmacy.  

## 2016-08-24 ENCOUNTER — Ambulatory Visit: Payer: Medicare Other

## 2016-08-24 DIAGNOSIS — I6523 Occlusion and stenosis of bilateral carotid arteries: Secondary | ICD-10-CM | POA: Diagnosis not present

## 2016-08-24 DIAGNOSIS — I6529 Occlusion and stenosis of unspecified carotid artery: Secondary | ICD-10-CM

## 2016-08-31 ENCOUNTER — Telehealth: Payer: Self-pay

## 2016-08-31 DIAGNOSIS — R32 Unspecified urinary incontinence: Secondary | ICD-10-CM

## 2016-08-31 NOTE — Telephone Encounter (Signed)
Pt left v/m requesting referral to [facility] fax # [phone]; pt request referral for leaky bladder. Pt last annual 08/05/15. No future appt scheduled.

## 2016-08-31 NOTE — Telephone Encounter (Signed)
Ordered. Thanks

## 2016-10-16 ENCOUNTER — Telehealth: Payer: Self-pay | Admitting: Cardiology

## 2016-10-16 NOTE — Telephone Encounter (Signed)
Did not need this encounter °

## 2016-10-17 ENCOUNTER — Other Ambulatory Visit: Payer: Self-pay | Admitting: Family Medicine

## 2016-10-17 NOTE — Telephone Encounter (Signed)
Received refill electronically Last refill 04/25/16 #90/1 Last office visit 03/02/16

## 2016-10-18 NOTE — Telephone Encounter (Signed)
Sent. Thanks.   

## 2016-10-30 ENCOUNTER — Ambulatory Visit: Payer: Medicare Other | Admitting: Internal Medicine

## 2016-10-31 ENCOUNTER — Ambulatory Visit: Payer: Medicare Other | Admitting: Family Medicine

## 2016-11-02 ENCOUNTER — Encounter: Payer: Self-pay | Admitting: Internal Medicine

## 2016-11-02 ENCOUNTER — Ambulatory Visit: Payer: Medicare Other | Admitting: Internal Medicine

## 2016-11-02 ENCOUNTER — Encounter (INDEPENDENT_AMBULATORY_CARE_PROVIDER_SITE_OTHER): Payer: Self-pay

## 2016-11-02 ENCOUNTER — Ambulatory Visit (INDEPENDENT_AMBULATORY_CARE_PROVIDER_SITE_OTHER): Payer: Medicare Other | Admitting: Internal Medicine

## 2016-11-02 VITALS — BP 112/76 | HR 82 | Temp 98.3°F | Wt 183.8 lb

## 2016-11-02 DIAGNOSIS — R300 Dysuria: Secondary | ICD-10-CM

## 2016-11-02 DIAGNOSIS — N3000 Acute cystitis without hematuria: Secondary | ICD-10-CM | POA: Diagnosis not present

## 2016-11-02 LAB — POC URINALSYSI DIPSTICK (AUTOMATED)
Bilirubin, UA: NEGATIVE
Glucose, UA: NEGATIVE
KETONES UA: NEGATIVE
Nitrite, UA: POSITIVE
SPEC GRAV UA: 1.02 (ref 1.030–1.035)
Urobilinogen, UA: NEGATIVE (ref ?–2.0)
pH, UA: 6 (ref 5.0–8.0)

## 2016-11-02 MED ORDER — CIPROFLOXACIN HCL 250 MG PO TABS: 250.0000 mg | ORAL_TABLET | Freq: Two times a day (BID) | ORAL | 0 refills | Status: DC

## 2016-11-02 NOTE — Addendum Note (Signed)
Addended by: Roena MaladyEVONTENNO, Joni Norrod Y on: 11/02/2016 04:28 PM   Modules accepted: Orders

## 2016-11-02 NOTE — Progress Notes (Signed)
HPI  Pt presents to the clinic today with c/o dysuria. This started 5 days ago. She denies urgency, frequency, blood in her urine, fever, chills, nausea or low back pain. She has tried AZO without any relief. She reports she saw urology 3/8 and had a cystoscopy. She reports the urologist told her she was at high risk for infection.   Review of Systems  Past Medical History:  Diagnosis Date  . Arthritis    knee and hip OA- prev injection by Dr. Ethelene Hal  . Blood transfusion without reported diagnosis    37 yrs ago -s/p back surgery  . Chest pain   . Coronary artery disease    50% mid LAD by cath January 2016  . Depression 1993, 2000  . Fibromyalgia   . GERD (gastroesophageal reflux disease)   . H/O hiatal hernia   . Hyperlipidemia   . Hypertension   . OSA (obstructive sleep apnea)    dx 2010-had a cpap-does not use-"uses special mouthpiece now"  . Osteoporosis   . Positive cardiac stress test   . Stroke (HCC)   . Urinary incontinence   . UTI (lower urinary tract infection)     Family History  Problem Relation Age of Onset  . Arthritis Mother   . Kidney disease Maternal Grandmother   . Stroke Father   . Hypertension Father   . Heart disease Brother     Died during CABG  . Hypertension Sister   . Dementia Sister   . Aneurysm Sister     Brain  . Parkinsonism Sister   . Colon cancer Neg Hx   . Breast cancer Neg Hx     Social History   Social History  . Marital status: Married    Spouse name: N/A  . Number of children: 4  . Years of education: N/A   Occupational History  . RETIRED Retired   Social History Main Topics  . Smoking status: Never Smoker  . Smokeless tobacco: Never Used     Comment: a small amount when 83 yo  . Alcohol use No  . Drug use: No  . Sexual activity: Not Currently   Other Topics Concern  . Not on file   Social History Narrative   Married 1953, husband is well.   5 pregnancies, 4 live births, all well (5th was unexpect pregnancy and  patient needed to terminate due to concurrent medical problems at the time)   11 grandchildren.  3 great grandchildren   Regular exercise:  No    Allergies  Allergen Reactions  . Digoxin Other (See Comments)    Felt weak, tired, "aweful"  . Nitrofurantoin Other (See Comments)    Sharp pain back of head  . Alendronate Sodium Other (See Comments)    cramps  . Simvastatin Other (See Comments)    Myalgia, possible intolerance  . Sulfonamide Derivatives Other (See Comments)    REACTION: (Maybe)-neck pain-brief  . Augmentin [Amoxicillin-Pot Clavulanate] Rash    In mouth  . Cefaclor Other (See Comments)    Felt bad  . Cephalexin Other (See Comments)    Felt bad, but able to tolerate amoxil prev  . Chlordiazepoxide Other (See Comments)    REACTION: (Maybe)  . Clavulanic Acid Other (See Comments)    Not an allergy.  Tolerates plain amoxil but not augmentin- diarrhea, etc with augmentin  . Omeprazole Rash    rash  . Oxcarbazepine Other (See Comments)    unknown  . Pb-Hyoscy-Atropine-Scopolamine Other (See Comments)  Unsure-doesn't remember  . Streptomycin Other (See Comments)    Lips itched  . Tetracycline Itching    itching  . Zetia [Ezetimibe] Other (See Comments)    possible cause of aches and abnormal dreams, stopped 05/2014     Constitutional: Denies fever, malaise, fatigue, headache or abrupt weight changes.   GU: Pt reports pain with urination. Denies urgency, frequency, burning sensation, blood in urine, odor or discharge. Skin: Denies redness, rashes, lesions or ulcercations.   No other specific complaints in a complete review of systems (except as listed in HPI above).    Objective:   Physical Exam  BP 112/76   Pulse 82   Temp 98.3 F (36.8 C) (Oral)   Wt 183 lb 12 oz (83.3 kg)   BMI 34.16 kg/m   Wt Readings from Last 3 Encounters:  11/02/16 183 lb 12 oz (83.3 kg)  04/04/16 185 lb (83.9 kg)  03/02/16 182 lb 12 oz (82.9 kg)    General: Appears her  stated age, well developed, well nourished in NAD. Abdomen: Soft. Normal bowel sounds. No distention or masses noted.  Tender to palpation over the bladder area. No CVA tenderness.       Assessment & Plan:   Urgency, Frequency, Dysuria secondary to   Urinalysis: 3+ leuks, 1+ blood, + nitrites Will send urine culture eRx sent if for Cipro 250 mg BID x 5 days OK to take AZO OTC Drink plenty of fluids  RTC as needed or if symptoms persist. Nicki ReaperBAITY, Kayzlee Wirtanen, NP

## 2016-11-02 NOTE — Patient Instructions (Signed)

## 2016-11-03 ENCOUNTER — Ambulatory Visit: Payer: Medicare Other | Admitting: Family Medicine

## 2016-11-04 LAB — URINE CULTURE

## 2016-11-09 ENCOUNTER — Telehealth: Payer: Self-pay | Admitting: Cardiovascular Disease

## 2016-11-09 DIAGNOSIS — E785 Hyperlipidemia, unspecified: Secondary | ICD-10-CM

## 2016-11-09 DIAGNOSIS — Z79899 Other long term (current) drug therapy: Secondary | ICD-10-CM

## 2016-11-09 DIAGNOSIS — I10 Essential (primary) hypertension: Secondary | ICD-10-CM

## 2016-11-09 NOTE — Telephone Encounter (Signed)
New message       Pt c/o medication issue:  1. Name of Medication: pravastatin  2. How are you currently taking this medication (dosage and times per day)? 40mg  3. Are you having a reaction (difficulty breathing--STAT)? no 4. What is your medication issue? Since doubling dosage, pt is having bad joint pain  ( legs and shoulders)

## 2016-11-09 NOTE — Telephone Encounter (Signed)
Returned call to patient-patient states when she saw Dr. Allyson SabalBerry at her last OV her pravastatin was doubled to 40mg .  Reports since then she is having muscle aches, joint pain, body aches, soreness and believes it is due to the increase in this medication as she has been intolerant to others in the past (simvastatin, Zetia).  Reports she tolerated 20mg  better and requesting to decrease medication back down.    Patient reports she is going to decrease back down to 20mg  and see if symptoms improve, will call us in 2 weeks to make us aware.  Advised I would route to MD to make aware and see if further changes were recommended.  Patient aware and verbalized understanding.

## 2016-11-10 MED ORDER — PRAVASTATIN SODIUM 20 MG PO TABS: 20.0000 mg | ORAL_TABLET | Freq: Every evening | ORAL | 3 refills | Status: DC

## 2016-11-10 NOTE — Telephone Encounter (Signed)
Okay to go back to original dosage and recheck. Start on Co Q 10 200 mg a day as well.

## 2016-11-10 NOTE — Telephone Encounter (Signed)
Returned call and made aware of recommendations. Verbalized understanding.

## 2016-12-13 ENCOUNTER — Telehealth: Payer: Self-pay | Admitting: Neurology

## 2016-12-13 NOTE — Telephone Encounter (Signed)
Please get her scheduled here, 30 min OV if possible.  Thanks.

## 2016-12-13 NOTE — Telephone Encounter (Signed)
Patient seen for abnormal MR two years ago.  She was scheduled for appt with Dr. Arbutus Leas as a follow up for "muscle problems."  I called patient to discuss. She states she started having problems with stinging and burning in her lower legs , difficulty walking, and pain in her shoulders.  She has not seen a doctor for this issues.  I advised her to see PCP first so that she gets to the right provider to treat her. She agrees with this plan. Appt cancelled.

## 2016-12-13 NOTE — Telephone Encounter (Signed)
Left message on answering machine at home number to call back. 

## 2016-12-13 NOTE — Telephone Encounter (Signed)
Sounds good.  I am happy to see her if Dr. Para March thinks that she needs seen.  Doesn't look like he has seen her in quite some time.  Clelia Croft, let me know if you think that she needs to come back.  I think that it has been a few years since I have seen the patient.

## 2016-12-14 NOTE — Telephone Encounter (Signed)
Patient returned Regina's call.  Patient scheduled appointment on 12/18/16 at 2:15.

## 2016-12-18 ENCOUNTER — Ambulatory Visit: Payer: Medicare Other | Admitting: Family Medicine

## 2016-12-19 ENCOUNTER — Ambulatory Visit (INDEPENDENT_AMBULATORY_CARE_PROVIDER_SITE_OTHER): Payer: Medicare Other | Admitting: Family Medicine

## 2016-12-19 ENCOUNTER — Ambulatory Visit: Payer: Medicare Other | Admitting: Neurology

## 2016-12-19 ENCOUNTER — Encounter: Payer: Self-pay | Admitting: Family Medicine

## 2016-12-19 ENCOUNTER — Ambulatory Visit (INDEPENDENT_AMBULATORY_CARE_PROVIDER_SITE_OTHER)
Admission: RE | Admit: 2016-12-19 | Discharge: 2016-12-19 | Disposition: A | Discharge status: Home / Self Care | Payer: Medicare Other | Source: Ambulatory Visit | Attending: Family Medicine | Admitting: Family Medicine

## 2016-12-19 VITALS — BP 118/62 | HR 75 | Temp 98.3°F | Wt 186.8 lb

## 2016-12-19 DIAGNOSIS — M791 Myalgia, unspecified site: Secondary | ICD-10-CM

## 2016-12-19 DIAGNOSIS — R202 Paresthesia of skin: Secondary | ICD-10-CM

## 2016-12-19 DIAGNOSIS — M25552 Pain in left hip: Secondary | ICD-10-CM

## 2016-12-19 NOTE — Progress Notes (Signed)
Pre visit review using our clinic review tool, if applicable. No additional management support is needed unless otherwise documented below in the visit note. 

## 2016-12-19 NOTE — Progress Notes (Signed)
She had some diffuse aches and she was in contact with cards- she was dropped to  prvastatin.  She has continued  coQ 10 per day.  She thinks that cutting back on the pravastatin helped some.   In the meantime, she is having other sx for about 2 years, gradually getting worse.  She has been having leg sx, "my legs just don't want to go".  Pain in the L lower back/buttock area.  She has some L hip and groin pain, sig pain trying to get in/out of car.  No with some R upper anterior thigh pain.  Still with some intermittent stinging and burning in the distal legs B but not on the feet. "it's a struggle for me to get my work done."   Her husband has been driving and letting her out close to entrances to limit her distance walking.  Some days are better than others.    She isn't having arm sx except for some B proximal arm pain.  Some occ medial elbow pain B.    PMH and SH reviewed  ROS: Per HPI unless specifically indicated in ROS section   Meds, vitals, and allergies reviewed.   GEN: nad, alert and oriented HEENT: mucous membranes moist NECK: supple w/o LA CV: rrr. PULM: ctab, no inc wob ABD: soft, +bs EXT: no edema SKIN: no acute rash CN 2-12 wnl B, S/S/DTR wnl x4 except for dec sens to vibration on the L foot- she has normal monofilament testing.   Normal L hip PROM but pain on AROM, pain with hip flexion but not resisted knee extension Large muscle groups in the ext x4 not ttp.

## 2016-12-19 NOTE — Patient Instructions (Signed)
Go to the lab on the way out.  We'll contact you with your lab and xray report. Stop the pravastatin for 10 days and update me.  Take care.  Glad to see you.

## 2016-12-20 DIAGNOSIS — M25552 Pain in left hip: Secondary | ICD-10-CM | POA: Insufficient documentation

## 2016-12-20 LAB — CBC WITH DIFFERENTIAL/PLATELET
BASOS PCT: 0.8 % (ref 0.0–3.0)
Basophils Absolute: 0.1 10*3/uL (ref 0.0–0.1)
EOS PCT: 2.9 % (ref 0.0–5.0)
Eosinophils Absolute: 0.2 10*3/uL (ref 0.0–0.7)
HCT: 43.2 % (ref 36.0–46.0)
Hemoglobin: 14.5 g/dL (ref 12.0–15.0)
LYMPHS PCT: 30.8 % (ref 12.0–46.0)
Lymphs Abs: 2.5 10*3/uL (ref 0.7–4.0)
MCHC: 33.5 g/dL (ref 30.0–36.0)
MCV: 96.9 fl (ref 78.0–100.0)
MONOS PCT: 7.3 % (ref 3.0–12.0)
Monocytes Absolute: 0.6 10*3/uL (ref 0.1–1.0)
NEUTROS ABS: 4.6 10*3/uL (ref 1.4–7.7)
NEUTROS PCT: 58.2 % (ref 43.0–77.0)
PLATELETS: 245 10*3/uL (ref 150.0–400.0)
RBC: 4.45 Mil/uL (ref 3.87–5.11)
RDW: 13.6 % (ref 11.5–15.5)
WBC: 8 10*3/uL (ref 4.0–10.5)

## 2016-12-20 LAB — COMPREHENSIVE METABOLIC PANEL
ALK PHOS: 78 U/L (ref 39–117)
ALT: 18 U/L (ref 0–35)
AST: 20 U/L (ref 0–37)
Albumin: 4.5 g/dL (ref 3.5–5.2)
BUN: 9 mg/dL (ref 6–23)
CHLORIDE: 102 meq/L (ref 96–112)
CO2: 31 meq/L (ref 19–32)
Calcium: 10.1 mg/dL (ref 8.4–10.5)
Creatinine, Ser: 0.84 mg/dL (ref 0.40–1.20)
GFR: 68.74 mL/min (ref >60.00)
GLUCOSE: 93 mg/dL (ref 70–99)
Potassium: 4.5 mEq/L (ref 3.5–5.1)
SODIUM: 140 meq/L (ref 135–145)
Total Bilirubin: 0.6 mg/dL (ref 0.2–1.2)
Total Protein: 7.2 g/dL (ref 6.0–8.3)

## 2016-12-20 LAB — CK: CK TOTAL: 127 U/L (ref 7–177)

## 2016-12-20 LAB — TSH: TSH: 2.96 u[IU]/mL (ref 0.35–4.50)

## 2016-12-20 LAB — SEDIMENTATION RATE: Sed Rate: 6 mm/hr (ref 0–30)

## 2016-12-20 LAB — VITAMIN B12: Vitamin B-12: 242 pg/mL (ref 211–911)

## 2016-12-20 NOTE — Assessment & Plan Note (Addendum)
Unclear how much this is inter-related. Some of her symptoms could be related to statin. Stop statin in the meantime. Check routine labs today, check left hip films. Unclear she has left hip arthritis versus PMR versus myalgias from statin versus paresthesia in the lower limbs from another cause. She does not have typical neuropathy symptoms in the feet, her symptoms are limited to the lower legs but exclude the feet. She does have decreased vibration sensation in the left foot. All discussed with patient. She could have overlapping issues, such as statin induced muscle pain in the legs affecting her gait which then causes hip pain. See notes on imaging and labs. We will update her. She will update me about her condition off statin. At this point still okay for outpatient follow-up. She agrees.  >25 minutes spent in face to face time with patient, >50% spent in counselling or coordination of care.  Parking form done per patient, given to patient.

## 2016-12-20 NOTE — Assessment & Plan Note (Signed)
See above

## 2016-12-29 ENCOUNTER — Telehealth: Payer: Self-pay

## 2016-12-29 NOTE — Telephone Encounter (Signed)
Pt left v/m; pt seen 12/19/16; pt was to stay off cholesterol med for 10 days; pts aches and pains are a little better; both hips are still hurting; pt has appt with Dr Ethelene Halamos for injections in both hips. FYI to Dr Para Marchuncan.

## 2016-12-31 NOTE — Telephone Encounter (Signed)
I would stay off the pravastatin for now, get through the shots with Dr. Ethelene Halamos and then go from there.  Thanks.

## 2017-01-01 NOTE — Telephone Encounter (Signed)
Patient advised.

## 2017-01-08 ENCOUNTER — Ambulatory Visit: Payer: Medicare Other | Admitting: Neurology

## 2017-01-11 ENCOUNTER — Ambulatory Visit (INDEPENDENT_AMBULATORY_CARE_PROVIDER_SITE_OTHER): Payer: Medicare Other | Admitting: Family Medicine

## 2017-01-11 ENCOUNTER — Encounter: Payer: Self-pay | Admitting: Family Medicine

## 2017-01-11 VITALS — BP 132/82 | HR 85 | Temp 98.4°F | Wt 186.8 lb

## 2017-01-11 DIAGNOSIS — R300 Dysuria: Secondary | ICD-10-CM

## 2017-01-11 LAB — POC URINALSYSI DIPSTICK (AUTOMATED)
Bilirubin, UA: NEGATIVE
Blood, UA: NEGATIVE
Glucose, UA: NEGATIVE
Ketones, UA: NEGATIVE
Nitrite, UA: NEGATIVE
Protein, UA: NEGATIVE
Spec Grav, UA: 1.02
Urobilinogen, UA: 0.2 U/dL
pH, UA: 7

## 2017-01-11 MED ORDER — CIPROFLOXACIN HCL 250 MG PO TABS: 250.0000 mg | ORAL_TABLET | Freq: Two times a day (BID) | ORAL | 0 refills | Status: DC

## 2017-01-11 NOTE — Addendum Note (Signed)
Addended by: Annamarie MajorFUQUAY, Delorse Shane S on: 01/11/2017 02:30 PM   Modules accepted: Orders

## 2017-01-11 NOTE — Progress Notes (Signed)
dysuria:yes duration of symptoms: a few days abdominal pain:yes fevers:no back pain:not more than normal, see below.   vomiting:no U/a d/w pt.  ucx pending.    She has less aches with stopping the statin.  Still with some pain. She has f/u with Dr. Ethelene Halamos pending about possible hip injection.    Meds, vitals, and allergies reviewed.   Per HPI unless specifically indicated in ROS section   GEN: nad, alert and oriented HEENT: mucous membranes moist NECK: supple CV: rrr.  PULM: ctab, no inc wob ABD: soft, +bs, suprapubic area not tender EXT: no edema BACK: no CVA pain

## 2017-01-11 NOTE — Patient Instructions (Signed)
Drink plenty of water and start the antibiotics today.  We'll contact you with your lab report.  Take care.   

## 2017-01-11 NOTE — Assessment & Plan Note (Signed)
Likely cystitis, cipro, ucx, see AVS.  Nontoxic.

## 2017-01-12 ENCOUNTER — Ambulatory Visit: Payer: Medicare Other | Admitting: Family Medicine

## 2017-01-13 LAB — URINE CULTURE

## 2017-01-22 ENCOUNTER — Emergency Department (HOSPITAL_COMMUNITY): Payer: Medicare Other

## 2017-01-22 ENCOUNTER — Emergency Department (HOSPITAL_COMMUNITY)
Admission: EM | Admit: 2017-01-22 | Discharge: 2017-01-22 | Disposition: A | Discharge status: Home / Self Care | Payer: Medicare Other | Attending: Emergency Medicine | Admitting: Emergency Medicine

## 2017-01-22 ENCOUNTER — Encounter (HOSPITAL_COMMUNITY): Payer: Self-pay | Admitting: Emergency Medicine

## 2017-01-22 DIAGNOSIS — I10 Essential (primary) hypertension: Secondary | ICD-10-CM | POA: Diagnosis not present

## 2017-01-22 DIAGNOSIS — Z79899 Other long term (current) drug therapy: Secondary | ICD-10-CM | POA: Diagnosis not present

## 2017-01-22 DIAGNOSIS — Z8673 Personal history of transient ischemic attack (TIA), and cerebral infarction without residual deficits: Secondary | ICD-10-CM | POA: Insufficient documentation

## 2017-01-22 DIAGNOSIS — R1010 Upper abdominal pain, unspecified: Secondary | ICD-10-CM

## 2017-01-22 DIAGNOSIS — K8020 Calculus of gallbladder without cholecystitis without obstruction: Secondary | ICD-10-CM | POA: Insufficient documentation

## 2017-01-22 DIAGNOSIS — R1011 Right upper quadrant pain: Secondary | ICD-10-CM | POA: Diagnosis present

## 2017-01-22 DIAGNOSIS — I2510 Atherosclerotic heart disease of native coronary artery without angina pectoris: Secondary | ICD-10-CM | POA: Insufficient documentation

## 2017-01-22 LAB — COMPREHENSIVE METABOLIC PANEL
ALT: 18 U/L (ref 14–54)
ANION GAP: 8 (ref 5–15)
AST: 24 U/L (ref 15–41)
Albumin: 4.1 g/dL (ref 3.5–5.0)
Alkaline Phosphatase: 83 U/L (ref 38–126)
BILIRUBIN TOTAL: 0.4 mg/dL (ref 0.3–1.2)
BUN: 9 mg/dL (ref 6–20)
CHLORIDE: 104 mmol/L (ref 101–111)
CO2: 29 mmol/L (ref 22–32)
Calcium: 9.4 mg/dL (ref 8.9–10.3)
Creatinine, Ser: 0.7 mg/dL (ref 0.44–1.00)
GFR calc Af Amer: >60 mL/min (ref >60)
GFR calc non Af Amer: >60 mL/min (ref >60)
GLUCOSE: 102 mg/dL — AB (ref 65–99)
POTASSIUM: 4.2 mmol/L (ref 3.5–5.1)
Sodium: 141 mmol/L (ref 135–145)
TOTAL PROTEIN: 7 g/dL (ref 6.5–8.1)

## 2017-01-22 LAB — CBC
HEMATOCRIT: 42.3 % (ref 36.0–46.0)
HEMOGLOBIN: 14.2 g/dL (ref 12.0–15.0)
MCH: 32.6 pg (ref 26.0–34.0)
MCHC: 33.6 g/dL (ref 30.0–36.0)
MCV: 97 fL (ref 78.0–100.0)
Platelets: 244 10*3/uL (ref 150–400)
RBC: 4.36 MIL/uL (ref 3.87–5.11)
RDW: 13.4 % (ref 11.5–15.5)
WBC: 6.9 10*3/uL (ref 4.0–10.5)

## 2017-01-22 LAB — URINALYSIS, ROUTINE W REFLEX MICROSCOPIC
Bilirubin Urine: NEGATIVE
GLUCOSE, UA: NEGATIVE mg/dL
Hgb urine dipstick: NEGATIVE
Ketones, ur: NEGATIVE mg/dL
LEUKOCYTES UA: NEGATIVE
NITRITE: NEGATIVE
PH: 6 (ref 5.0–8.0)
Protein, ur: NEGATIVE mg/dL
SPECIFIC GRAVITY, URINE: 1.005 (ref 1.005–1.030)

## 2017-01-22 LAB — LIPASE, BLOOD: LIPASE: 26 U/L (ref 11–51)

## 2017-01-22 MED ORDER — ACETAMINOPHEN 500 MG PO TABS
1000.0000 mg | ORAL_TABLET | Freq: Once | ORAL | Status: AC
  Administered 2017-01-22: 1000 mg via ORAL
  Filled 2017-01-22: qty 2

## 2017-01-22 MED ORDER — TRAMADOL HCL 50 MG PO TABS: 50.0000 mg | ORAL_TABLET | Freq: Four times a day (QID) | ORAL | 0 refills | Status: DC | PRN

## 2017-01-22 MED ORDER — ONDANSETRON 8 MG PO TBDP: 8.0000 mg | ORAL_TABLET | Freq: Three times a day (TID) | ORAL | 0 refills | Status: DC | PRN

## 2017-01-22 NOTE — ED Notes (Signed)
ED Provider at bedside. 

## 2017-01-22 NOTE — ED Notes (Signed)
Pt attempted to provide urine sample but was not able. Pt said that she would try again when she was able.

## 2017-01-22 NOTE — ED Triage Notes (Signed)
Per patient, states she has RLQ pain that radiates to her back-symptoms going on for 4 days-no dysuria-states pain is constant bu then she has some painful attacks-thinks it is her gallbladder

## 2017-01-22 NOTE — ED Notes (Signed)
PT DISCHARGED. INSTRUCTIONS AND PRESCRIPTIONS GIVEN. AAOX4. PT IN NO APPARENT DISTRESS WITH MILD PAIN. THE OPPORTUNITY TO ASK QUESTIONS WAS PROVIDED. 

## 2017-01-22 NOTE — Discharge Instructions (Signed)
It was our pleasure to provide your ER care today - we hope that you feel better.  Your ultrasound shows gallstones.   Take acetaminophen as need for pain.  You may also take ultram as need for pain - no driving when taking.  You may take zofran as need for nausea.  Follow up with general surgeon in the next couple weeks - see referral - call office to arrange appointment.   Return to ER if worse, new symptoms, fevers, worsening or severe pain, persistent vomiting, other concern.

## 2017-01-22 NOTE — ED Provider Notes (Signed)
[facility]-EMERGENCY DEPT Provider Note   CSN: [id number] Arrival date & time: 01/22/17  4098     History   Chief Complaint Chief Complaint  Patient presents with  . Abdominal Pain    HPI Robin Juarez is a 83 y.o. female.  Patient c/o right upper abdominal pain, for the past couple days.  Pain constant, dull, moderate, however there are times when pain is worse.   Denies hx same pain.  No associated nv, or fevers. Pain occurs at rest, but is worse with change of position or certain movements.  Not related to eating. No relation to exertion.  Denies rash/lesions to area of pain. Hx back pain/problems in past. Says she thinks it may be due to gallstones.  No chest pain or sob. No cough. No dysuria or hematuria.  No mid to lower abd pain.     Abdominal Pain   Pertinent negatives include fever, vomiting, dysuria, hematuria and headaches.    Past Medical History:  Diagnosis Date  . Arthritis    knee and hip OA- prev injection by Dr. Ethelene Hal  . Blood transfusion without reported diagnosis    37 yrs ago -s/p back surgery  . Chest pain   . Coronary artery disease    50% mid LAD by cath January 2016  . Depression 1993, 2000  . Fibromyalgia   . GERD (gastroesophageal reflux disease)   . H/O hiatal hernia   . Hyperlipidemia   . Hypertension   . OSA (obstructive sleep apnea)    dx 2010-had a cpap-does not use-"uses special mouthpiece now"  . Osteoporosis   . Positive cardiac stress test   . Stroke (HCC)   . Urinary incontinence   . UTI (lower urinary tract infection)     Patient Active Problem List   Diagnosis Date Noted  . Left hip pain 12/20/2016  . Skin change 03/03/2016  . Leg length discrepancy 11/19/2014  . Dysuria 10/25/2014  . Positive cardiac stress test   . Essential hypertension 08/19/2014  . Atypical chest pain 08/18/2014  . CVA (cerebral infarction) 08/18/2014  . Balance problem 07/14/2014  . Elevated blood pressure (not hypertension) 06/10/2014  . Advance  care planning 03/02/2014  . Spinal stenosis, lumbar region, with neurogenic claudication 05/01/2013  . Back pain 04/17/2013  . Paresthesia 08/21/2012  . Tremor 08/21/2012  . OSA (obstructive sleep apnea) 08/07/2012  . Medicare annual wellness visit, subsequent 01/12/2012  . Thumb pain 08/25/2011  . Fatigue 04/27/2011  . HYPERGLYCEMIA, MILD 09/12/2010  . DISORDER OF BONE AND CARTILAGE UNSPECIFIED 05/23/2010  . Anxiety state 03/01/2010  . ESOPHAGEAL SPASM 03/01/2010  . HIATAL HERNIA WITH REFLUX 03/01/2010  . GENERALIZED OSTEOARTHROSIS UNSPECIFIED SITE 03/01/2010  . RHEUMATISM UNSPECIFIED AND FIBROSITIS 03/01/2010  . Myalgia 03/01/2010  . OSTEOPOROSIS 03/01/2010  . HLD (hyperlipidemia) 02/25/2010  . MDD (major depressive disorder) 02/25/2010  . GERD 02/25/2010  . URINARY INCONTINENCE 02/25/2010  . UTI'S, HX OF 02/25/2010    Past Surgical History:  Procedure Laterality Date  . ABDOMINAL HYSTERECTOMY  1976  . APPENDECTOMY  1964  . Back fusions  1977   missing vertebra  . BACK SURGERY     fusions lumbar area  . BREAST BIOPSY  1995   x 2  . BREAST BIOPSY  07/10/2012   Procedure: BREAST BIOPSY WITH NEEDLE LOCALIZATION;  Surgeon: Emelia Loron, MD;  Location: [facility];  Service: General;  Laterality: Right;  . CARDIOVASCULAR STRESS TEST  2009   Normal, Dr. Mayford Knife  .  CATARACT EXTRACTION, BILATERAL Bilateral 2005  . Childbirth  [id number]  . CYSTOSCOPY N/A 11/25/2012   Procedure: CYSTOSCOPY;  Surgeon: Martina Sinner, MD;  [facility];  Service: Urology;  Laterality: N/A;  . dilatation and currettage  1964  . LEFT HEART CATHETERIZATION WITH CORONARY ANGIOGRAM N/A 09/03/2014   Procedure: LEFT HEART CATHETERIZATION WITH CORONARY ANGIOGRAM;  Surgeon: Runell Gess, MD;  Location[facility];  Service: Cardiovascular;  Laterality: N/A;  . LUMBAR LAMINECTOMY/DECOMPRESSION MICRODISCECTOMY N/A 05/01/2013   Procedure: COMPLETE  DECOMPRESSIVE LUMBAR LAMINECTOMY L3-L4 MICRODISCECTOMY L3-L4 ON THE LEFT;  Surgeon: Jacki Cones, MD;  Location: [facility];  Service: Orthopedics;  Laterality: N/A;  . OVARIAN CYST REMOVAL  1964  . PUBOVAGINAL SLING N/A 11/25/2012   Procedure: Leonides Grills;  Surgeon: Martina Sinner, MD;  Location: [facility];  Service: Urology;  Laterality: N/A;    OB History    No data available       Home Medications    Prior to Admission medications   Medication Sig Start Date End Date Taking? Authorizing Provider  cholecalciferol (VITAMIN D) 1000 UNITS tablet Take 1,000 Units by mouth daily.     Yes [provider]  clopidogrel (PLAVIX) 75 MG tablet TAKE 1 TABLET (75 MG TOTAL) BY MOUTH DAILY. 10/18/16  Yes Joaquim Nam, MD  Coenzyme Q10 (CO Q 10 PO) Take 1 tablet by mouth daily.   Yes [provider]  DULoxetine (CYMBALTA) 30 MG capsule Take 90 mg by mouth daily.    Yes [provider]  folic acid (FOLVITE) 800 MCG tablet Take 800 mcg by mouth daily.    Yes [provider]  gabapentin (NEURONTIN) 300 MG capsule Take 300 mg by mouth 4 (four) times daily.    Yes [provider]  hydrocortisone 2.5 % ointment Apply 1 application topically daily.   Yes [provider]  KRILL OIL PO Take 100 mg by mouth daily.   Yes [provider]  lamoTRIgine (LAMICTAL) 100 MG tablet Take 100 mg by mouth at bedtime.   Yes [provider]  metoprolol succinate (TOPROL-XL) 25 MG 24 hr tablet TAKE 1 TABLET (25 MG TOTAL) BY MOUTH DAILY. 08/22/16  Yes Runell Gess, MD  OLANZapine (ZYPREXA) 2.5 MG tablet Take 1 tablet (2.5 mg total) by mouth at bedtime. 08/05/15  Yes Joaquim Nam, MD    Family History Family History  Problem Relation Age of Onset  . Arthritis Mother   . Stroke Father   . Hypertension Father   . Heart disease Brother        Died during CABG  . Hypertension Sister   . Dementia Sister   . Aneurysm  Sister        Brain  . Parkinsonism Sister   . Kidney disease Maternal Grandmother   . Colon cancer Neg Hx   . Breast cancer Neg Hx     Social History Social History  Substance Use Topics  . Smoking status: Never Smoker  . Smokeless tobacco: Never Used     Comment: a small amount when 83 yo  . Alcohol use No     Allergies   Digoxin; Nitrofurantoin; Alendronate sodium; Simvastatin; Sulfonamide derivatives; Pravastatin; Augmentin [amoxicillin-pot clavulanate]; Cefaclor; Cephalexin; Chlordiazepoxide; Clavulanic acid; Omeprazole; Oxcarbazepine; Pb-hyoscy-atropine-scopolamine; Streptomycin; Tetracycline; and Zetia [ezetimibe]   Review of Systems Review of Systems  Constitutional: Negative for fever.  HENT: Negative for sore throat.   Eyes: Negative for redness.  Respiratory: Negative for shortness of breath.   Cardiovascular: Negative for chest pain.  Gastrointestinal: Positive for abdominal pain. Negative for vomiting.  Genitourinary: Negative for dysuria and hematuria.  Musculoskeletal: Positive for back pain.  Skin: Negative for rash.  Neurological: Negative for headaches.  Hematological: Does not bruise/bleed easily.  Psychiatric/Behavioral: Negative for confusion.     Physical Exam Updated Vital Signs BP (!) 151/80 (BP Location: Left Arm)   Pulse 74   Temp 98.4 F (36.9 C) (Oral)   Resp 15   Ht 1.626 m (5\' 4" )   Wt 81.6 kg (180 lb)   SpO2 95%   BMI 30.90 kg/m   Physical Exam  Constitutional: She appears well-developed and well-nourished. No distress.  HENT:  Mouth/Throat: Oropharynx is clear and moist.  Eyes: Conjunctivae are normal. No scleral icterus.  Neck: Neck supple. No tracheal deviation present.  Cardiovascular: Normal rate, regular rhythm, normal heart sounds and intact distal pulses.  Exam reveals no gallop and no friction rub.   No murmur heard. Pulmonary/Chest: Effort normal and breath sounds normal. No respiratory distress.  Abdominal: Soft.  Normal appearance and bowel sounds are normal. She exhibits no distension and no mass. There is no tenderness. There is no rebound and no guarding. No hernia.  Genitourinary:  Genitourinary Comments: No cva tenderness  Musculoskeletal: She exhibits no edema.  TLS spine non tender, aligned.   Neurological: She is alert.  Skin: Skin is warm and dry. No rash noted. She is not diaphoretic.  Psychiatric: She has a normal mood and affect.  Nursing note and vitals reviewed.    ED Treatments / Results  Labs (all labs ordered are listed, but only abnormal results are displayed) Results for orders placed or performed during the hospital encounter of 01/22/17  Lipase, blood  Result Value Ref Range   Lipase 26 11 - 51 U/L  Comprehensive metabolic panel  Result Value Ref Range   Sodium 141 135 - 145 mmol/L   Potassium 4.2 3.5 - 5.1 mmol/L   Chloride 104 101 - 111 mmol/L   CO2 29 22 - 32 mmol/L   Glucose, Bld 102 (H) 65 - 99 mg/dL   BUN 9 6 - 20 mg/dL   Creatinine, Ser 4.09 0.44 - 1.00 mg/dL   Calcium 9.4 8.9 - 81.1 mg/dL   Total Protein 7.0 6.5 - 8.1 g/dL   Albumin 4.1 3.5 - 5.0 g/dL   AST 24 15 - 41 U/L   ALT 18 14 - 54 U/L   Alkaline Phosphatase 83 38 - 126 U/L   Total Bilirubin 0.4 0.3 - 1.2 mg/dL   GFR calc non Af Amer >60 >60 mL/min   GFR calc Af Amer >60 >60 mL/min   Anion gap 8 5 - 15  CBC  Result Value Ref Range   WBC 6.9 4.0 - 10.5 K/uL   RBC 4.36 3.87 - 5.11 MIL/uL   Hemoglobin 14.2 12.0 - 15.0 g/dL   HCT 91.4 78.2 - 95.6 %   MCV 97.0 78.0 - 100.0 fL   MCH 32.6 26.0 - 34.0 pg   MCHC 33.6 30.0 - 36.0 g/dL   RDW 21.3 08.6 - 57.8 %   Platelets 244 150 - 400 K/uL  Urinalysis, Routine w reflex microscopic  Result Value Ref Range   Color, Urine STRAW (A) YELLOW   APPearance CLEAR CLEAR   Specific Gravity, Urine 1.005 1.005 - 1.030   pH 6.0 5.0 - 8.0   Glucose, UA NEGATIVE NEGATIVE mg/dL  Hgb urine dipstick NEGATIVE NEGATIVE   Bilirubin Urine NEGATIVE NEGATIVE    Ketones, ur NEGATIVE NEGATIVE mg/dL   Protein, ur NEGATIVE NEGATIVE mg/dL   Nitrite NEGATIVE NEGATIVE   Leukocytes, UA NEGATIVE NEGATIVE    EKG  EKG Interpretation None       Radiology [place] Abdomen Limited  Result Date: 01/22/2017 CLINICAL DATA:  Three-day history of right upper quadrant pain EXAM: ULTRASOUND ABDOMEN LIMITED RIGHT UPPER QUADRANT COMPARISON:  None. FINDINGS: Gallbladder: Within the gallbladder, there are echogenic foci which move and shadow consistent with cholelithiasis. Largest gallstone measures 7 mm in length. There is no gallbladder wall thickening or pericholecystic fluid. No sonographic Murphy sign noted by sonographer. Common bile duct: Diameter: 5 mm. No intrahepatic or extrahepatic biliary duct dilatation. Liver: No focal lesion identified. Liver echogenicity is diffusely increased. IMPRESSION: Cholelithiasis. Increased liver echogenicity, most likely due to hepatic steatosis. While no focal liver lesions are evident on this study, it must be cautioned that the sensitivity of ultrasound for detection of focal liver lesions is diminished in this circumstance. Electronically Signed   By: Bretta BangWilliam  Woodruff III M.D.   On: 01/22/2017 13:02    Procedures Procedures (including critical care time)  Medications Ordered in ED Medications  acetaminophen (TYLENOL) tablet 1,000 mg (1,000 mg Oral Given 01/22/17 1226)     Initial Impression / Assessment and Plan / ED Course  I have reviewed the triage vital signs and the nursing notes.  Pertinent labs & imaging results that were available during my care of the patient were reviewed by me and considered in my medical decision making (see chart for details).  Labs. U/s.    Pt indicates has not taken anything yet today for pain, and prefers to take tylenol.   Acetaminophen 1 gm po.  Reviewed nursing notes and prior charts for additional history.   U/s c/w gallstones, no cholecystitis. Pain improved. abd soft nt.  Afeb.  Pt currently appears stable for d/c.       Final Clinical Impressions(s) / ED Diagnoses   Final diagnoses:  None    New Prescriptions New Prescriptions   No medications on file     Cathren LaineSteinl, Victorina Kable, MD 01/22/17 1328

## 2017-02-02 ENCOUNTER — Ambulatory Visit: Payer: Self-pay | Admitting: Surgery

## 2017-02-02 NOTE — H&P (Signed)
[name] 02/02/2017 2:21 PM Location: [facility] Patient #: [id number] [id number] DOB: [birth year] Married / Language: English / Race: White Female  History of Present Illness (Dorrine Montone A. Fredricka Bonineonnor MD; 02/02/2017 2:38 PM) Patient words: This is an 83 year old woman is referred from the ER for symptomatically cholelithiasis. She presented there on June 4 with a 2 or 3 day history of right upper quadrant abdominal pain which she described as constant, dull. She never had any prior similar symptoms. Did not have any nausea vomiting or fever at that time the pain was exacerbated by change of position or certain movements, but was not related to eating or exertion. Of note she had carried some heavy boxes of drinking glasses on her right hip a few days before this. The pain had gradually subsided but recurred later that day. In the ER she had a right upper quadrant ultrasound that did demonstrate cholelithiasis. Common bile duct 5 mm. Increased liver echogenicity consistent with hepatic steatosis. Her CMP was unremarkable as was her CBC.  Her medical history is significant for coronary artery disease status post catheter in January 2016 showing 50% LAD stenosis, GERD, hiatal hernia, hypertension hyperlipidemia, sleep apnea, stroke, urinary incontinence and recurrent UTIs, arthritis, spinal stenosis with neurogenic claudication, fibromyalgia and depression. Surgical history is significant for vaginal hysterectomy, appendectomy and ovarian cyst removal (low midline), multiple back fusions, breast biopsies, pubovaginal sling, and addition to the left heart catheter 2 years ago: "ANGIOGRAPHIC RESULTS:  1. Left main; normal 2. LAD; 40-50% mid after a moderate size diagonal branch 3. Left circumflex; nondominant and normal. 4. Right coronary artery; dominant and normal 5. Left ventriculography; RAO left ventriculogram was performed using 25 mL of Visipaque dye at 12 mL/second. The overall LVEF  estimated 60 % Without wall motion abnormalities  IMPRESSION: Robin Juarez has essentially normal coronary arteries with a intermediate mid LAD lesion but did not appear to be hemodynamically significant. I believe her chest pain is noncardiac and her Myoview false positive. Anterior was performed of her right common femoral artery and her puncture site was sealed with a Vascade closure device with excellent hemostasis. The patient left the laboratory stable condition. She'll be hydrated for 2 hours and discharged home. She will be seen back in our office by mid-level provider in 2-3 weeks.".  The patient is a 83 year old female.   Past Surgical History Christianne Dolin(Christen Lambert, [place]; 02/02/2017 2:21 PM) Breast Biopsy Bilateral. Cataract Surgery Bilateral. Hysterectomy (not due to cancer) - Partial Spinal Surgery - Lower Back  Diagnostic Studies History Christianne Dolin(Christen Lambert, RMA; 02/02/2017 2:21 PM) Colonoscopy >10 years ago Mammogram 1-3 years ago Pap Smear >5 years ago  Allergies Christianne Dolin(Christen Lambert, RMA; 02/02/2017 2:27 PM) Keflex *CEPHALOSPORINS* Fosamax *ENDOCRINE AND METABOLIC AGENTS - MISC.* cramps,pain Macrobid *URINARY ANTI-INFECTIVES* sharp pain in back of pts head Sulfur (Antiseborrheic) *DERMATOLOGICALS* Ceclor *CEPHALOSPORINS* Streptomycin Sulfate *AMINOGLYCOSIDES* Itching. Lanoxin *CARDIOTONICS* Trileptal *ANTICONVULSANTS* Rash. Tetracycline *CHEMICALS* Itching. Omeprazole *CHEMICALS* red bumps Augmentin *PENICILLINS* mouth rawness Librium *ANTIANXIETY AGENTS* Chlor-Trimeton *ANTIHISTAMINES*  Medication History Christianne Dolin(Christen Lambert, RMA; 02/02/2017 2:30 PM) Cymbalta (60MG  Capsule DR Part, Oral) Active. Cymbalta (30MG  Capsule DR Part, Oral) Active. Gabapentin (300MG  Capsule, Oral four times daily) Active. OLANZapine (2.5MG  Tablet, Oral at bedtime) Active. LamoTRIgine (100MG  Tablet, Oral at bedtime) Active. Metoprolol Succinate ER (25MG  Tablet ER 24HR, Oral  daily) Active. Clopidogrel Bisulfate (75MG  Tablet, Oral daily) Active. Vitamin D (1000UNIT Tablet, Oral daily) Active. Folic Acid (400MCG Tablet, Oral) Active. CO-Q 10 Omega-3 Fish Oil (Oral) Specific strength unknown -  Active. Pravastatin Sodium (40MG  Tablet, Oral at bedtime) Active. Ondansetron (8MG  Tablet Disint, Oral) Active. TraMADol HCl (50MG  Tablet, Oral) Active. Multiple Vitamins (Oral daily) Active. Medications Reconciled  Social History Christianne Dolin, [place]; 02/02/2017 2:21 PM) Caffeine use Carbonated beverages, Coffee, Tea. No alcohol use Tobacco use Never smoker.  Family History Christianne Dolin, [place]; 02/02/2017 2:21 PM) Alcohol Abuse Father, Mother. Depression Father, Mother. Heart Disease Father. Heart disease in female family member before age 83 Hypertension Father.  Pregnancy / Birth History Christianne Dolin, [place]; 02/02/2017 2:21 PM) Age at menarche 15 years. Age of menopause <47 Gravida 4 Maternal age 61-20 Para 4  Other Problems Christianne Dolin, [place]; 02/02/2017 2:21 PM) Back Pain Bladder Problems Cerebrovascular Accident Cholelithiasis Depression High blood pressure Hypercholesterolemia     Review of Systems Christianne Dolin RMA; 02/02/2017 2:21 PM) General Present- Weight Gain. Not Present- Appetite Loss, Chills, Fatigue, Fever, Night Sweats and Weight Loss. Skin Present- Dryness. Not Present- Change in Wart/Mole, Hives, Jaundice, New Lesions, Non-Healing Wounds, Rash and Ulcer. HEENT Present- Wears glasses/contact lenses. Not Present- Earache, Hearing Loss, Hoarseness, Nose Bleed, Oral Ulcers, Ringing in the Ears, Seasonal Allergies, Sinus Pain, Sore Throat, Visual Disturbances and Yellow Eyes. Respiratory Not Present- Bloody sputum, Chronic Cough, Difficulty Breathing, Snoring and Wheezing. Breast Not Present- Breast Mass, Breast Pain, Nipple Discharge and Skin Changes. Cardiovascular Present- Leg Cramps. Not Present-  Chest Pain, Difficulty Breathing Lying Down, Palpitations, Rapid Heart Rate, Shortness of Breath and Swelling of Extremities. Gastrointestinal Not Present- Abdominal Pain, Bloating, Bloody Stool, Change in Bowel Habits, Chronic diarrhea, Constipation, Difficulty Swallowing, Excessive gas, Gets full quickly at meals, Hemorrhoids, Indigestion, Nausea, Rectal Pain and Vomiting. Female Genitourinary Present- Frequency and Urgency. Not Present- Nocturia, Painful Urination and Pelvic Pain. Musculoskeletal Present- Back Pain, Muscle Pain and Muscle Weakness. Not Present- Joint Pain, Joint Stiffness and Swelling of Extremities. Neurological Present- Tremor, Trouble walking and Weakness. Not Present- Decreased Memory, Fainting, Headaches, Numbness, Seizures and Tingling. Psychiatric Present- Depression. Not Present- Anxiety, Bipolar, Change in Sleep Pattern, Fearful and Frequent crying. Endocrine Not Present- Cold Intolerance, Excessive Hunger, Hair Changes, Heat Intolerance, Hot flashes and New Diabetes. Hematology Present- Blood Thinners. Not Present- Easy Bruising, Excessive bleeding, Gland problems, HIV and Persistent Infections.  Vitals Christianne Dolin RMA; 02/02/2017 2:31 PM) 02/02/2017 2:30 PM Weight: 184 lb Height: 61in Body Surface Area: 1.82 m Body Mass Index: 34.77 kg/m  Temp.: 98.71F  Pulse: 81 (Regular)  BP: 160/90 (Sitting, Left Arm, Standard)      Physical Exam (Persephonie Hegwood A. Fredricka Bonine MD; 02/02/2017 2:40 PM)  The physical exam findings are as follows: Note:She is alert and well-appearing Neck without mass or thyromegaly Anicteric, extraocular motions intact Moist mucus membranes, good dentition Unlabored respirations, symmetrical air entry Regular rate and rhythm, palpable pedal pulses, no pedal edema Abdomen is soft, nontender nondistended. No masses or organomegaly. Well healed low midline incision. Extremities are warm without deformity Neuro grossly intact, normal  gait Psych normal affect, appropriate insight Skin no lesions or rashes on limited exam    Assessment & Plan (Chaniyah Jahr A. Fredricka Bonine MD; 02/02/2017 2:41 PM)  SYMPTOMATIC CHOLELITHIASIS (K80.20) Story: We discussed the natural history of gallstones, including the risks of not pursuing surgery including escalating symptoms, cholecystitis, cholangitis, pancreatitis, or no change. We also discussed the nature of surgery. I offered her laparoscopic cholecystectomy and described to her the operation. We discussed the risks of bleeding, infection, pain, scarring, intra-abdominal injury specifically to the common bile duct and sequelae. Discussed possible loose bowel movements after surgery which  would be self-limited. I would like her to have cardiac clearance given her age, stroke hx and prior cath. All of her questions were answered today. Will proceed with surgery. 23h obs.

## 2017-03-01 ENCOUNTER — Telehealth: Payer: Self-pay | Admitting: Cardiovascular Disease

## 2017-03-01 NOTE — Telephone Encounter (Signed)
Requesting surgical clearance:  1. Type of surgery: Laparoscopic Cholecystectomy  2. Surgeon:   Not specified  3.Surgical Date:  TBD  4. Medications that need to be held: Plavix--5 days prior to procedure   5. CAD: Yes  6. I will defer to:  Dr. Conception OmsBerry   Contact Information:  [facility] Phone:  [phone] Fax:  [phone]

## 2017-03-04 NOTE — Telephone Encounter (Signed)
OK to interrupt antiplatelet Rx for Lap chole

## 2017-03-06 NOTE — Telephone Encounter (Signed)
Clearance routed via Epic to # provided. 

## 2017-03-31 ENCOUNTER — Other Ambulatory Visit: Payer: Self-pay | Admitting: Cardiovascular Disease

## 2017-03-31 DIAGNOSIS — Z79899 Other long term (current) drug therapy: Secondary | ICD-10-CM

## 2017-03-31 DIAGNOSIS — E785 Hyperlipidemia, unspecified: Secondary | ICD-10-CM

## 2017-03-31 DIAGNOSIS — I10 Essential (primary) hypertension: Secondary | ICD-10-CM

## 2017-04-09 ENCOUNTER — Other Ambulatory Visit: Payer: Self-pay | Admitting: Family Medicine

## 2017-04-10 NOTE — Telephone Encounter (Signed)
Sent. Thanks.   

## 2017-04-30 LAB — HM MAMMOGRAPHY

## 2017-05-04 ENCOUNTER — Encounter: Payer: Self-pay | Admitting: Family Medicine

## 2017-05-15 ENCOUNTER — Ambulatory Visit (INDEPENDENT_AMBULATORY_CARE_PROVIDER_SITE_OTHER): Payer: Medicare Other | Admitting: Primary Care

## 2017-05-15 ENCOUNTER — Encounter: Payer: Self-pay | Admitting: Primary Care

## 2017-05-15 ENCOUNTER — Encounter (INDEPENDENT_AMBULATORY_CARE_PROVIDER_SITE_OTHER): Payer: Self-pay

## 2017-05-15 VITALS — BP 134/78 | HR 77 | Temp 98.0°F | Wt 184.4 lb

## 2017-05-15 DIAGNOSIS — N3001 Acute cystitis with hematuria: Secondary | ICD-10-CM | POA: Diagnosis not present

## 2017-05-15 DIAGNOSIS — R300 Dysuria: Secondary | ICD-10-CM

## 2017-05-15 LAB — POC URINALSYSI DIPSTICK (AUTOMATED)
BILIRUBIN UA: NEGATIVE
Glucose, UA: NEGATIVE
KETONES UA: NEGATIVE
Nitrite, UA: NEGATIVE
PH UA: 7 (ref 5.0–8.0)
PROTEIN UA: NEGATIVE
SPEC GRAV UA: 1.015 (ref 1.010–1.025)
Urobilinogen, UA: 0.2 E.U./dL

## 2017-05-15 MED ORDER — CIPROFLOXACIN HCL 250 MG PO TABS: 250.0000 mg | ORAL_TABLET | Freq: Two times a day (BID) | ORAL | 0 refills | Status: AC

## 2017-05-15 NOTE — Addendum Note (Signed)
Addended by: Tawnya Crook on: 05/15/2017 03:08 PM   Modules accepted: Orders

## 2017-05-15 NOTE — Patient Instructions (Signed)
Start Ciprofloxacin tablets for urinary tract infection. Take 1 tablet by mouth twice daily for 5 days.  You may take AZO as needed for your symptoms.  Ensure you are staying hydrated with water.  It was a pleasure meeting you!

## 2017-05-15 NOTE — Progress Notes (Signed)
Subjective:    Patient ID: Robin Juarez, female    DOB: 1930/08/30, 83 y.o.   MRN: [id number]  HPI  [name] is an 83 year old female with a history of UTI, hyperglycemia, hypertension who presents today with a chief complaint of dysuria. She also reports lower back pain, frequency. She denies hematuria, fevers, vaginal discharge. Her symptoms began 2-3 days ago. She's undergoing botulinum toxin injections per Urology for overactive bladder with her last injection being in early August. She's having to catheterize herself twice daily since then because of the effectiveness of the Botox injections.   Review of Systems  Constitutional: Negative for fever.  Gastrointestinal: Negative for abdominal pain and nausea.  Genitourinary: Positive for dysuria and frequency. Negative for hematuria and vaginal discharge.  Musculoskeletal: Positive for back pain.       Past Medical History:  Diagnosis Date  . Arthritis    knee and hip OA- prev injection by Dr. Ethelene Hal  . Blood transfusion without reported diagnosis    37 yrs ago -s/p back surgery  . Chest pain   . Coronary artery disease    50% mid LAD by cath January 2016  . Depression 1993, 2000  . Fibromyalgia   . GERD (gastroesophageal reflux disease)   . H/O hiatal hernia   . Hyperlipidemia   . Hypertension   . OSA (obstructive sleep apnea)    dx 2010-had a cpap-does not use-"uses special mouthpiece now"  . Osteoporosis   . Positive cardiac stress test   . Stroke (HCC)   . Urinary incontinence   . UTI (lower urinary tract infection)      Social History   Social History  . Marital status: Married    Spouse name: N/A  . Number of children: 4  . Years of education: N/A   Occupational History  . RETIRED Retired   Social History Main Topics  . Smoking status: Never Smoker  . Smokeless tobacco: Never Used     Comment: a small amount when 83 yo  . Alcohol use No  . Drug use: No  . Sexual activity: Not Currently    Other Topics Concern  . Not on file   Social History Narrative   Married 1953, husband is well.   5 pregnancies, 4 live births, all well (5th was unexpect pregnancy and patient needed to terminate due to concurrent medical problems at the time)   11 grandchildren.  3 great grandchildren   Regular exercise:  No    Past Surgical History:  Procedure Laterality Date  . ABDOMINAL HYSTERECTOMY  1976  . APPENDECTOMY  1964  . Back fusions  1977   missing vertebra  . BACK SURGERY     fusions lumbar area  . BREAST BIOPSY  1995   x 2  . BREAST BIOPSY  07/10/2012   Procedure: BREAST BIOPSY WITH NEEDLE LOCALIZATION;  Surgeon: Emelia Loron, MD;  Location: [facility];  Service: General;  Laterality: Right;  . CARDIOVASCULAR STRESS TEST  2009   Normal, Dr. Mayford Knife  . CATARACT EXTRACTION, BILATERAL Bilateral 2005  . Childbirth  [phone]  . CYSTOSCOPY N/A 11/25/2012   Procedure: CYSTOSCOPY;  Surgeon: Martina Sinner, MD;  Location: [facility];  Service: Urology;  Laterality: N/A;  . dilatation and currettage  1964  . LEFT HEART CATHETERIZATION WITH CORONARY ANGIOGRAM N/A 09/03/2014   Procedure: LEFT HEART CATHETERIZATION WITH CORONARY ANGIOGRAM;  Surgeon: Runell Gess, MD;  Location: [facility];  Service: Cardiovascular;  Laterality: N/A;  . LUMBAR LAMINECTOMY/DECOMPRESSION MICRODISCECTOMY N/A 05/01/2013   Procedure: COMPLETE DECOMPRESSIVE LUMBAR LAMINECTOMY L3-L4 MICRODISCECTOMY L3-L4 ON THE LEFT;  Surgeon: Jacki Cones, MD;  Location: [facility];  Service: Orthopedics;  Laterality: N/A;  . OVARIAN CYST REMOVAL  1964  . PUBOVAGINAL SLING N/A 11/25/2012   Procedure: Leonides Grills;  Surgeon: Martina Sinner, MD;  Location: [facility];  Service: Urology;  Laterality: N/A;    Family History  Problem Relation Age of Onset  . Arthritis Mother   . Stroke Father   . Hypertension Father   . Heart disease Brother        Died  during CABG  . Hypertension Sister   . Dementia Sister   . Aneurysm Sister        Brain  . Parkinsonism Sister   . Kidney disease Maternal Grandmother   . Colon cancer Neg Hx   . Breast cancer Neg Hx     Allergies  Allergen Reactions  . Digoxin Other (See Comments)    Felt weak, tired, "aweful"  . Nitrofurantoin Other (See Comments)    Sharp pain back of head  . Alendronate Sodium Other (See Comments)    cramps  . Simvastatin Other (See Comments)    Myalgia, possible intolerance  . Sulfonamide Derivatives Other (See Comments)    REACTION: (Maybe)-neck pain-brief  . Pravastatin Other (See Comments)    Myalgias  . Augmentin [Amoxicillin-Pot Clavulanate] Rash    In mouth  . Cefaclor Other (See Comments)    Felt bad  . Cephalexin Other (See Comments)    Felt bad, but able to tolerate amoxil prev  . Chlordiazepoxide Other (See Comments)    REACTION: (Maybe)  . Clavulanic Acid Other (See Comments)    Not an allergy.  Tolerates plain amoxil but not augmentin- diarrhea, etc with augmentin  . Omeprazole Rash    rash  . Oxcarbazepine Other (See Comments)    unknown  . Pb-Hyoscy-Atropine-Scopolamine Other (See Comments)    Unsure-doesn't remember  . Streptomycin Other (See Comments)    Lips itched  . Tetracycline Itching    itching  . Zetia [Ezetimibe] Other (See Comments)    possible cause of aches and abnormal dreams, stopped 05/2014    Current Outpatient Prescriptions on File Prior to Visit  Medication Sig Dispense Refill  . cholecalciferol (VITAMIN D) 1000 UNITS tablet Take 1,000 Units by mouth daily.      . clopidogrel (PLAVIX) 75 MG tablet TAKE 1 TABLET (75 MG TOTAL) BY MOUTH DAILY. 90 tablet 3  . Coenzyme Q10 (CO Q 10 PO) Take 1 tablet by mouth daily.    . DULoxetine (CYMBALTA) 30 MG capsule Take 90 mg by mouth daily.     . folic acid (FOLVITE) 800 MCG tablet Take 800 mcg by mouth daily.     [name] gabapentin (NEURONTIN) 300 MG capsule Take 300 mg by mouth 4 (four)  times daily.     . hydrocortisone 2.5 % ointment Apply 1 application topically daily.    [name] KRILL OIL PO Take 100 mg by mouth daily.    [name] lamoTRIgine (LAMICTAL) 100 MG tablet Take 100 mg by mouth at bedtime.    . metoprolol succinate (TOPROL-XL) 25 MG 24 hr tablet TAKE 1 TABLET (25 MG TOTAL) BY MOUTH DAILY. 90 tablet 3  . OLANZapine (ZYPREXA) 2.5 MG tablet Take 1 tablet (2.5 mg total) by mouth at bedtime.    . pravastatin (PRAVACHOL) 40 MG tablet  TAKE 1 TABLET (40 MG TOTAL) BY MOUTH EVERY EVENING. 90 tablet 3  . traMADol (ULTRAM) 50 MG tablet Take 1 tablet (50 mg total) by mouth every 6 (six) hours as needed. 20 tablet 0  . ondansetron (ZOFRAN ODT) 8 MG disintegrating tablet Take 1 tablet (8 mg total) by mouth every 8 (eight) hours as needed for nausea or vomiting. (Patient not taking: Reported on 05/15/2017) 10 tablet 0   No current facility-administered medications on file prior to visit.     BP 134/78   Pulse 77   Temp 98 F (36.7 C) (Oral)   Wt 184 lb 6.4 oz (83.6 kg)   SpO2 96%   BMI 31.65 kg/m    Objective:   Physical Exam  Constitutional: She appears well-nourished.  Neck: Neck supple.  Cardiovascular: Normal rate and regular rhythm.   Pulmonary/Chest: Effort normal and breath sounds normal.  Abdominal: Normal appearance. There is no tenderness. There is no CVA tenderness.  Skin: Skin is warm and dry.          Assessment & Plan:  Acute Cystitis:  Frequency and dysuria x 2 days. Is catheterizing herself BID per Urology. Discussed appropriate sterile technique.  Exam today unremarkable. UA: 3+ leuks, trace blood, negative nitrites. Culture sent. History of bacteria resistant UTI's, therefore Rx for Cipro sent to pharmacy. Fluids, rest, follow up PRN.  Morrie Sheldon, NP

## 2017-05-18 LAB — URINE CULTURE
MICRO NUMBER: 81060709
SPECIMEN QUALITY:: ADEQUATE

## 2017-06-06 ENCOUNTER — Ambulatory Visit (INDEPENDENT_AMBULATORY_CARE_PROVIDER_SITE_OTHER): Payer: Medicare Other

## 2017-06-06 DIAGNOSIS — Z23 Encounter for immunization: Secondary | ICD-10-CM

## 2017-06-13 ENCOUNTER — Encounter (HOSPITAL_COMMUNITY): Payer: Self-pay | Admitting: Emergency Medicine

## 2017-06-13 ENCOUNTER — Emergency Department (HOSPITAL_COMMUNITY): Payer: Medicare Other

## 2017-06-13 ENCOUNTER — Observation Stay (HOSPITAL_COMMUNITY)
Admission: EM | Admit: 2017-06-13 | Discharge: 2017-06-16 | Disposition: A | Discharge status: Home / Self Care | Payer: Medicare Other | Attending: Internal Medicine | Admitting: Internal Medicine

## 2017-06-13 DIAGNOSIS — E785 Hyperlipidemia, unspecified: Secondary | ICD-10-CM | POA: Diagnosis not present

## 2017-06-13 DIAGNOSIS — I2510 Atherosclerotic heart disease of native coronary artery without angina pectoris: Secondary | ICD-10-CM | POA: Diagnosis not present

## 2017-06-13 DIAGNOSIS — R262 Difficulty in walking, not elsewhere classified: Secondary | ICD-10-CM | POA: Diagnosis not present

## 2017-06-13 DIAGNOSIS — Z79899 Other long term (current) drug therapy: Secondary | ICD-10-CM | POA: Insufficient documentation

## 2017-06-13 DIAGNOSIS — K219 Gastro-esophageal reflux disease without esophagitis: Secondary | ICD-10-CM | POA: Diagnosis not present

## 2017-06-13 DIAGNOSIS — M549 Dorsalgia, unspecified: Secondary | ICD-10-CM | POA: Diagnosis not present

## 2017-06-13 DIAGNOSIS — Z882 Allergy status to sulfonamides status: Secondary | ICD-10-CM | POA: Diagnosis not present

## 2017-06-13 DIAGNOSIS — I10 Essential (primary) hypertension: Secondary | ICD-10-CM | POA: Insufficient documentation

## 2017-06-13 DIAGNOSIS — R0789 Other chest pain: Secondary | ICD-10-CM

## 2017-06-13 DIAGNOSIS — Z7902 Long term (current) use of antithrombotics/antiplatelets: Secondary | ICD-10-CM | POA: Diagnosis not present

## 2017-06-13 DIAGNOSIS — Z8249 Family history of ischemic heart disease and other diseases of the circulatory system: Secondary | ICD-10-CM | POA: Insufficient documentation

## 2017-06-13 DIAGNOSIS — M810 Age-related osteoporosis without current pathological fracture: Secondary | ICD-10-CM | POA: Diagnosis not present

## 2017-06-13 DIAGNOSIS — K449 Diaphragmatic hernia without obstruction or gangrene: Secondary | ICD-10-CM | POA: Insufficient documentation

## 2017-06-13 DIAGNOSIS — F319 Bipolar disorder, unspecified: Secondary | ICD-10-CM | POA: Diagnosis not present

## 2017-06-13 DIAGNOSIS — M1990 Unspecified osteoarthritis, unspecified site: Secondary | ICD-10-CM | POA: Insufficient documentation

## 2017-06-13 DIAGNOSIS — G8929 Other chronic pain: Secondary | ICD-10-CM | POA: Insufficient documentation

## 2017-06-13 DIAGNOSIS — R079 Chest pain, unspecified: Secondary | ICD-10-CM | POA: Diagnosis not present

## 2017-06-13 DIAGNOSIS — Z880 Allergy status to penicillin: Secondary | ICD-10-CM | POA: Diagnosis not present

## 2017-06-13 DIAGNOSIS — M797 Fibromyalgia: Secondary | ICD-10-CM | POA: Insufficient documentation

## 2017-06-13 DIAGNOSIS — G4733 Obstructive sleep apnea (adult) (pediatric): Secondary | ICD-10-CM | POA: Diagnosis present

## 2017-06-13 DIAGNOSIS — I2584 Coronary atherosclerosis due to calcified coronary lesion: Secondary | ICD-10-CM | POA: Insufficient documentation

## 2017-06-13 DIAGNOSIS — Z8673 Personal history of transient ischemic attack (TIA), and cerebral infarction without residual deficits: Secondary | ICD-10-CM | POA: Insufficient documentation

## 2017-06-13 LAB — BASIC METABOLIC PANEL
Anion gap: 7 (ref 5–15)
BUN: 10 mg/dL (ref 6–20)
CO2: 27 mmol/L (ref 22–32)
Calcium: 9 mg/dL (ref 8.9–10.3)
Chloride: 102 mmol/L (ref 101–111)
Creatinine, Ser: 0.84 mg/dL (ref 0.44–1.00)
GFR calc Af Amer: >60 mL/min (ref >60)
GLUCOSE: 104 mg/dL — AB (ref 65–99)
POTASSIUM: 3.7 mmol/L (ref 3.5–5.1)
Sodium: 136 mmol/L (ref 135–145)

## 2017-06-13 LAB — CBC
HEMATOCRIT: 40.9 % (ref 36.0–46.0)
Hemoglobin: 13.4 g/dL (ref 12.0–15.0)
MCH: 32.2 pg (ref 26.0–34.0)
MCHC: 32.8 g/dL (ref 30.0–36.0)
MCV: 98.3 fL (ref 78.0–100.0)
PLATELETS: 211 10*3/uL (ref 150–400)
RBC: 4.16 MIL/uL (ref 3.87–5.11)
RDW: 13.2 % (ref 11.5–15.5)
WBC: 7.3 10*3/uL (ref 4.0–10.5)

## 2017-06-13 LAB — I-STAT TROPONIN, ED: Troponin i, poc: 0 ng/mL (ref 0.00–0.08)

## 2017-06-13 MED ORDER — NITROGLYCERIN 0.4 MG SL SUBL
0.4000 mg | SUBLINGUAL_TABLET | SUBLINGUAL | Status: DC | PRN
  Administered 2017-06-13 – 2017-06-14 (×2): 0.4 mg via SUBLINGUAL
  Filled 2017-06-13 (×2): qty 1

## 2017-06-13 NOTE — ED Provider Notes (Signed)
TIME SEEN: 11:04 PM  CHIEF COMPLAINT: Chest pain  HPI: Patient is an 83 year old female with history of CAD on catheterization in 2016, hypertension, hyperlipidemia, previous CVA on Plavix who presents to the emergency department with complaints of chest pain.  Pain started around 6:30 PM while at rest and is described as an aching, uncomfortable feeling.  She has a difficult time describing the pain otherwise.  No radiation of pain.  Pain improved after aspirin in the emergency department but not completely gone.  No shortness of breath, nausea or vomiting, dizziness, diaphoresis.  No recent fevers or cough.  No lower extremity swelling or pain.  No aggravating factors.  States this does not feel like indigestion like she is previously had with her hiatal hernia.  She has never had similar chest pain.  Cath Jan 2016:  ANGIOGRAPHIC RESULTS:   1. Left main; normal  2. LAD; 40-50% mid after a moderate size diagonal branch 3. Left circumflex; nondominant and normal.  4. Right coronary artery; dominant and normal 5. Left ventriculography; RAO left ventriculogram was performed using  25 mL of Visipaque dye at 12 mL/second. The overall LVEF estimated  60 %  Without wall motion abnormalities  ROS: See HPI Constitutional: no fever  Eyes: no drainage  ENT: no runny nose   Cardiovascular:   chest pain  Resp: no SOB  GI: no vomiting GU: no dysuria Integumentary: no rash  Allergy: no hives  Musculoskeletal: no leg swelling  Neurological: no slurred speech ROS otherwise negative  PAST MEDICAL HISTORY/PAST SURGICAL HISTORY:  Past Medical History:  Diagnosis Date  . Arthritis    knee and hip OA- prev injection by Dr. Ethelene Hal  . Blood transfusion without reported diagnosis    37 yrs ago -s/p back surgery  . Chest pain   . Coronary artery disease    50% mid LAD by cath January 2016  . Depression 1993, 2000  . Fibromyalgia   . GERD (gastroesophageal reflux disease)   . H/O hiatal hernia    . Hyperlipidemia   . Hypertension   . OSA (obstructive sleep apnea)    dx 2010-had a cpap-does not use-"uses special mouthpiece now"  . Osteoporosis   . Positive cardiac stress test   . Stroke (HCC)   . Urinary incontinence   . UTI (lower urinary tract infection)     MEDICATIONS:  Prior to Admission medications   Medication Sig Start Date End Date Taking? Authorizing Provider  cholecalciferol (VITAMIN D) 1000 UNITS tablet Take 1,000 Units by mouth daily.      [provider]  clopidogrel (PLAVIX) 75 MG tablet TAKE 1 TABLET (75 MG TOTAL) BY MOUTH DAILY. 04/10/17   Joaquim Nam, MD  Coenzyme Q10 (CO Q 10 PO) Take 1 tablet by mouth daily.    [provider]  DULoxetine (CYMBALTA) 30 MG capsule Take 90 mg by mouth daily.     [provider]  folic acid (FOLVITE) 800 MCG tablet Take 800 mcg by mouth daily.     [provider]  gabapentin (NEURONTIN) 300 MG capsule Take 300 mg by mouth 4 (four) times daily.     [provider]  hydrocortisone 2.5 % ointment Apply 1 application topically daily.    [provider]  KRILL OIL PO Take 100 mg by mouth daily.    [provider]  lamoTRIgine (LAMICTAL) 100 MG tablet Take 100 mg by mouth at bedtime.    [provider]  metoprolol succinate (TOPROL-XL)  25 MG 24 hr tablet TAKE 1 TABLET (25 MG TOTAL) BY MOUTH DAILY. 08/22/16   Runell Gess, MD  OLANZapine (ZYPREXA) 2.5 MG tablet Take 1 tablet (2.5 mg total) by mouth at bedtime. 08/05/15   Joaquim Nam, MD  ondansetron (ZOFRAN ODT) 8 MG disintegrating tablet Take 1 tablet (8 mg total) by mouth every 8 (eight) hours as needed for nausea or vomiting. Patient not taking: Reported on 05/15/2017 01/22/17   Cathren Laine, MD  pravastatin (PRAVACHOL) 40 MG tablet TAKE 1 TABLET (40 MG TOTAL) BY MOUTH EVERY EVENING. 04/02/17 07/01/17  Runell Gess, MD  traMADol (ULTRAM) 50 MG tablet Take 1 tablet (50 mg total) by mouth every 6  (six) hours as needed. 01/22/17   Cathren Laine, MD    ALLERGIES:  Allergies  Allergen Reactions  . Digoxin Other (See Comments)    Felt weak, tired, "aweful"  . Nitrofurantoin Other (See Comments)    Sharp pain back of head  . Alendronate Sodium Other (See Comments)    cramps  . Simvastatin Other (See Comments)    Myalgia, possible intolerance  . Sulfonamide Derivatives Other (See Comments)    REACTION: (Maybe)-neck pain-brief  . Pravastatin Other (See Comments)    Myalgias  . Augmentin [Amoxicillin-Pot Clavulanate] Rash    In mouth  . Cefaclor Other (See Comments)    Felt bad  . Cephalexin Other (See Comments)    Felt bad, but able to tolerate amoxil prev  . Chlordiazepoxide Other (See Comments)    REACTION: (Maybe)  . Clavulanic Acid Other (See Comments)    Not an allergy.  Tolerates plain amoxil but not augmentin- diarrhea, etc with augmentin  . Omeprazole Rash    rash  . Oxcarbazepine Other (See Comments)    unknown  . Pb-Hyoscy-Atropine-Scopolamine Other (See Comments)    Unsure-doesn't remember  . Streptomycin Other (See Comments)    Lips itched  . Tetracycline Itching    itching  . Zetia [Ezetimibe] Other (See Comments)    possible cause of aches and abnormal dreams, stopped 05/2014    SOCIAL HISTORY:  Social History  Substance Use Topics  . Smoking status: Never Smoker  . Smokeless tobacco: Never Used     Comment: a small amount when 83 yo  . Alcohol use No    FAMILY HISTORY: Family History  Problem Relation Age of Onset  . Arthritis Mother   . Stroke Father   . Hypertension Father   . Heart disease Brother        Died during CABG  . Hypertension Sister   . Dementia Sister   . Aneurysm Sister        Brain  . Parkinsonism Sister   . Kidney disease Maternal Grandmother   . Colon cancer Neg Hx   . Breast cancer Neg Hx     EXAM: BP (!) 161/73 (BP Location: Right Arm)   Pulse 65   Temp 97.7 F (36.5 C) (Oral)   Resp 18   Ht 5\' 1"  (1.549 m)    Wt 80.7 kg (178 lb)   SpO2 97%   BMI 33.63 kg/m  CONSTITUTIONAL: Alert and oriented and responds appropriately to questions. Well-appearing; well-nourished, elderly, obese HEAD: Normocephalic EYES: Conjunctivae clear, pupils appear equal, EOMI ENT: normal nose; moist mucous membranes NECK: Supple, no meningismus, no nuchal rigidity, no LAD  CARD: RRR; S1 and S2 appreciated; no murmurs, no clicks, no rubs, no gallops CHEST:  Chest wall is nontender to palpation.  No  crepitus, ecchymosis, erythema, warmth, rash or other lesions present.   RESP: Normal chest excursion without splinting or tachypnea; breath sounds clear and equal bilaterally; no wheezes, no rhonchi, no rales, no hypoxia or respiratory distress, speaking full sentences ABD/GI: Normal bowel sounds; non-distended; soft, non-tender, no rebound, no guarding, no peritoneal signs, no hepatosplenomegaly BACK:  The back appears normal and is non-tender to palpation, there is no CVA tenderness EXT: Normal ROM in all joints; non-tender to palpation; no edema; normal capillary refill; no cyanosis, no calf tenderness or swelling    SKIN: Normal color for age and race; warm; no rash NEURO: Moves all extremities equally PSYCH: The patient's mood and manner are appropriate. Grooming and personal hygiene are appropriate.  MEDICAL DECISION MAKING: Patient here with complaints of chest pain.  Patient has a heart score of 4.  First troponin negative.  Chest x-ray clear.  Given her risk factors, I recommended admission for a chest pain rule out.  Patient and family comfortable with this plan.  We will give her nitroglycerin to see if we can get her chest pain-free.  Her PCP is Dr. Crawford GivensGraham Duncan [facility].  ED PROGRESS: 12:17 AM  Discussed patient's case with hospitalist, Dr. Toniann FailKakrakandy.  I have recommended admission and patient (and family if present) agree with this plan. Admitting physician will place admission orders.   I reviewed all nursing  notes, vitals, pertinent previous records, EKGs, lab and urine results, imaging (as available).       EKG Interpretation  Date/Time:  Wednesday June 13 2017 22:14:57 EDT Ventricular Rate:  66 PR Interval:    QRS Duration: 98 QT Interval:  410 QTC Calculation: 430 R Axis:   33 Text Interpretation:  Sinus rhythm No significant change since last tracing Confirmed by Melene PlanFloyd, Dan [phone]([id number]) on 06/13/2017 10:56:25 PM         Parth Mccormac, Layla MawKristen N, DO 06/14/17 [id number]

## 2017-06-13 NOTE — ED Triage Notes (Signed)
Pt from home via [facility]. C/o 5/10 non radiating CP that started x3hrs ago. Denies any other s/s. Describes pain as dull pressure. 324 ASA given PTA by EMS. Pt refused NTG. Hx of CVA on Plavix. No heart hx.

## 2017-06-14 ENCOUNTER — Encounter (HOSPITAL_COMMUNITY): Payer: Self-pay | Admitting: Internal Medicine

## 2017-06-14 ENCOUNTER — Other Ambulatory Visit: Payer: Self-pay

## 2017-06-14 ENCOUNTER — Observation Stay (HOSPITAL_BASED_OUTPATIENT_CLINIC_OR_DEPARTMENT_OTHER): Payer: Medicare Other

## 2017-06-14 ENCOUNTER — Observation Stay (HOSPITAL_COMMUNITY): Payer: Medicare Other

## 2017-06-14 DIAGNOSIS — R079 Chest pain, unspecified: Secondary | ICD-10-CM | POA: Diagnosis not present

## 2017-06-14 DIAGNOSIS — I2510 Atherosclerotic heart disease of native coronary artery without angina pectoris: Secondary | ICD-10-CM | POA: Diagnosis not present

## 2017-06-14 DIAGNOSIS — R072 Precordial pain: Secondary | ICD-10-CM | POA: Diagnosis not present

## 2017-06-14 DIAGNOSIS — I2584 Coronary atherosclerosis due to calcified coronary lesion: Secondary | ICD-10-CM | POA: Diagnosis not present

## 2017-06-14 DIAGNOSIS — E785 Hyperlipidemia, unspecified: Secondary | ICD-10-CM | POA: Diagnosis not present

## 2017-06-14 DIAGNOSIS — R0789 Other chest pain: Secondary | ICD-10-CM

## 2017-06-14 DIAGNOSIS — G4733 Obstructive sleep apnea (adult) (pediatric): Secondary | ICD-10-CM | POA: Diagnosis not present

## 2017-06-14 DIAGNOSIS — F319 Bipolar disorder, unspecified: Secondary | ICD-10-CM | POA: Diagnosis not present

## 2017-06-14 LAB — CBC
HCT: 38.4 % (ref 36.0–46.0)
Hemoglobin: 12.9 g/dL (ref 12.0–15.0)
MCH: 32.7 pg (ref 26.0–34.0)
MCHC: 33.6 g/dL (ref 30.0–36.0)
MCV: 97.5 fL (ref 78.0–100.0)
PLATELETS: 187 10*3/uL (ref 150–400)
RBC: 3.94 MIL/uL (ref 3.87–5.11)
RDW: 13.2 % (ref 11.5–15.5)
WBC: 7.1 10*3/uL (ref 4.0–10.5)

## 2017-06-14 LAB — CREATININE, SERUM
CREATININE: 0.82 mg/dL (ref 0.44–1.00)
GFR calc Af Amer: >60 mL/min (ref >60)
GFR calc non Af Amer: >60 mL/min (ref >60)

## 2017-06-14 LAB — TROPONIN I: Troponin I: <0.03 ng/mL (ref <0.03)

## 2017-06-14 LAB — ECHOCARDIOGRAM COMPLETE
HEIGHTINCHES: 61 in
WEIGHTICAEL: 2899.2 [oz_av]

## 2017-06-14 MED ORDER — ONDANSETRON 4 MG PO TBDP: 8.0000 mg | ORAL_TABLET | Freq: Three times a day (TID) | ORAL | Status: DC | PRN

## 2017-06-14 MED ORDER — OLANZAPINE 2.5 MG PO TABS
2.5000 mg | ORAL_TABLET | Freq: Every day | ORAL | Status: DC
  Administered 2017-06-14 – 2017-06-15 (×2): 2.5 mg via ORAL
  Filled 2017-06-14 (×3): qty 1

## 2017-06-14 MED ORDER — FOLIC ACID 1 MG PO TABS
1000.0000 ug | ORAL_TABLET | Freq: Every day | ORAL | Status: DC
  Administered 2017-06-14 – 2017-06-15 (×2): 1 mg via ORAL
  Filled 2017-06-14 (×2): qty 1

## 2017-06-14 MED ORDER — NITROGLYCERIN 0.4 MG SL SUBL
SUBLINGUAL_TABLET | SUBLINGUAL | Status: AC
  Administered 2017-06-14: 0.8 mg via SUBLINGUAL
  Filled 2017-06-14: qty 2

## 2017-06-14 MED ORDER — IOPAMIDOL (ISOVUE-370) INJECTION 76%
INTRAVENOUS | Status: AC
  Administered 2017-06-14: 100 mL
  Filled 2017-06-14: qty 100

## 2017-06-14 MED ORDER — LAMOTRIGINE 100 MG PO TABS
100.0000 mg | ORAL_TABLET | Freq: Every day | ORAL | Status: DC
  Administered 2017-06-14 – 2017-06-15 (×2): 100 mg via ORAL
  Filled 2017-06-14 (×2): qty 1

## 2017-06-14 MED ORDER — FOLIC ACID 1 MG PO TABS: 1000.0000 ug | ORAL_TABLET | Freq: Every day | ORAL | Status: DC

## 2017-06-14 MED ORDER — GABAPENTIN 300 MG PO CAPS: 300.0000 mg | ORAL_CAPSULE | ORAL | Status: DC

## 2017-06-14 MED ORDER — METOPROLOL SUCCINATE ER 25 MG PO TB24
25.0000 mg | ORAL_TABLET | Freq: Every day | ORAL | Status: DC
  Administered 2017-06-14 – 2017-06-15 (×2): 25 mg via ORAL
  Filled 2017-06-14 (×3): qty 1

## 2017-06-14 MED ORDER — ENOXAPARIN SODIUM 40 MG/0.4ML ~~LOC~~ SOLN
40.0000 mg | SUBCUTANEOUS | Status: DC
  Administered 2017-06-14 – 2017-06-15 (×2): 40 mg via SUBCUTANEOUS
  Filled 2017-06-14 (×2): qty 0.4

## 2017-06-14 MED ORDER — GABAPENTIN 300 MG PO CAPS
300.0000 mg | ORAL_CAPSULE | Freq: Two times a day (BID) | ORAL | Status: DC
  Administered 2017-06-14 – 2017-06-15 (×4): 300 mg via ORAL
  Filled 2017-06-14 (×4): qty 1

## 2017-06-14 MED ORDER — GABAPENTIN 300 MG PO CAPS
300.0000 mg | ORAL_CAPSULE | Freq: Two times a day (BID) | ORAL | Status: DC
  Filled 2017-06-14: qty 1

## 2017-06-14 MED ORDER — GABAPENTIN 300 MG PO CAPS
600.0000 mg | ORAL_CAPSULE | Freq: Every day | ORAL | Status: DC
  Administered 2017-06-14 – 2017-06-15 (×2): 600 mg via ORAL
  Filled 2017-06-14 (×2): qty 2

## 2017-06-14 MED ORDER — ACETAMINOPHEN 325 MG PO TABS: 650.0000 mg | ORAL_TABLET | ORAL | Status: DC | PRN

## 2017-06-14 MED ORDER — ONDANSETRON HCL 4 MG/2ML IJ SOLN: 4.0000 mg | Freq: Four times a day (QID) | INTRAMUSCULAR | Status: DC | PRN

## 2017-06-14 MED ORDER — DULOXETINE HCL 60 MG PO CPEP
90.0000 mg | ORAL_CAPSULE | Freq: Every day | ORAL | Status: DC
  Administered 2017-06-14 – 2017-06-16 (×3): 90 mg via ORAL
  Filled 2017-06-14 (×3): qty 1

## 2017-06-14 MED ORDER — METOPROLOL TARTRATE 5 MG/5ML IV SOLN
5.0000 mg | Freq: Once | INTRAVENOUS | Status: AC
  Administered 2017-06-14: 5 mg via INTRAVENOUS

## 2017-06-14 MED ORDER — METOPROLOL SUCCINATE ER 25 MG PO TB24: 25.0000 mg | ORAL_TABLET | Freq: Every day | ORAL | Status: DC

## 2017-06-14 MED ORDER — TRAMADOL HCL 50 MG PO TABS: 50.0000 mg | ORAL_TABLET | Freq: Four times a day (QID) | ORAL | Status: DC | PRN

## 2017-06-14 MED ORDER — CLOPIDOGREL BISULFATE 75 MG PO TABS
75.0000 mg | ORAL_TABLET | Freq: Every day | ORAL | Status: DC
  Administered 2017-06-14 – 2017-06-16 (×3): 75 mg via ORAL
  Filled 2017-06-14 (×3): qty 1

## 2017-06-14 MED ORDER — METOPROLOL TARTRATE 5 MG/5ML IV SOLN
INTRAVENOUS | Status: AC
  Administered 2017-06-14: 5 mg via INTRAVENOUS
  Filled 2017-06-14: qty 5

## 2017-06-14 MED ORDER — NITROGLYCERIN 0.4 MG SL SUBL
0.8000 mg | SUBLINGUAL_TABLET | Freq: Once | SUBLINGUAL | Status: AC
  Administered 2017-06-14: 0.8 mg via SUBLINGUAL

## 2017-06-14 MED ORDER — METOPROLOL TARTRATE 5 MG/5ML IV SOLN
INTRAVENOUS | Status: AC
  Administered 2017-06-14: 14:00:00
  Filled 2017-06-14: qty 5

## 2017-06-14 MED ORDER — VITAMIN D 1000 UNITS PO TABS
1000.0000 [IU] | ORAL_TABLET | Freq: Every day | ORAL | Status: DC
  Administered 2017-06-14 – 2017-06-16 (×3): 1000 [IU] via ORAL
  Filled 2017-06-14 (×3): qty 1

## 2017-06-14 NOTE — Progress Notes (Signed)
  Echocardiogram 2D Echocardiogram has been performed.  Robin SavoyCasey N Corra Juarez 06/14/2017, 3:49 PM

## 2017-06-14 NOTE — Progress Notes (Addendum)
Patient complained of chest pain 4/10. Patient stated that she does not need pain medication or nitro. EKG in progress. Will continue to monitor.  Caoimhe Damron, RN

## 2017-06-14 NOTE — Consult Note (Signed)
 Cardiology Consultation:   Patient ID: Robin Juarez; [id number]; 09/14/1930   Admit date: 06/13/2017 Date of Consult: 06/14/2017  Primary Care Provider: Duncan, Graham S, MD Primary Cardiologist: Dr. J. Berry Primary Electrophysiologist:  NA   Patient Profile:   Robin Juarez is a 83 y.o. female with a hx of HLD, HTN, hx of CVA, also cath with 50% mLAD 2016 and FH with brother with CAD who is being seen today for the evaluation of chest pain at the request of Dr. Alekh.  History of Present Illness:   Robin Juarez has  a hx of HLD, HTN, hx of CVA, also cath with 50% mLAD 2016 and FH with brother with CAD and previous chest pain not felt to be cardiac.  Now admitted 06/13/17 with chest pain.  Started yesterday and she had not had any problems before.  She does have Hiatal hernia and pain is similar to her HH but when she drank water it did not go away.  After 3 hours EMS called.  NTG X 2 with relief.   Pain with radiation to her back mildly.  No nausea or SOB.    EKG on admit I personally reviewed SR no changes from 2017 and today SR normal EKG  Tele I personally reviewed. SR  Troponin 0.00 <0.03, <0.03 K+ 3.7, Cr 0.84 WBC 7.1, hgb 13.4  CXR  Minimal bronchitic changes  Currently she had some mild pain this AM but none currently.  She did stop her statin due to leg pain and difficulty moving, pravastatin.  Initially held for 10 days then was to resume but she did not.  Has difficulty ambulating.  Also with chronic back pain.  Past Medical History:  Diagnosis Date  . Arthritis    knee and hip OA- prev injection by Dr. Ramos  . Blood transfusion without reported diagnosis    37 yrs ago -s/p back surgery  . Chest pain   . Coronary artery disease    50% mid LAD by cath January 2016  . Depression 1993, 2000  . Fibromyalgia   . GERD (gastroesophageal reflux disease)   . H/O hiatal hernia   . Hyperlipidemia   . Hypertension   . OSA (obstructive sleep apnea)    dx  2010-had a cpap-does not use-"uses special mouthpiece now"  . Osteoporosis   . Positive cardiac stress test   . Stroke (HCC)   . Urinary incontinence   . UTI (lower urinary tract infection)     Past Surgical History:  Procedure Laterality Date  . ABDOMINAL HYSTERECTOMY  1976  . APPENDECTOMY  1964  . Back fusions  1977   missing vertebra  . BACK SURGERY     fusions lumbar area  . BREAST BIOPSY  1995   x 2  . BREAST BIOPSY  07/10/2012   Procedure: BREAST BIOPSY WITH NEEDLE LOCALIZATION;  Surgeon: Matthew Wakefield, MD;  Location: [facility];  Service: General;  Laterality: Right;  . CARDIOVASCULAR STRESS TEST  2009   Normal, Dr. Turner  . CATARACT EXTRACTION, BILATERAL Bilateral 2005  . Childbirth  1954,1956,1957,1960  . CYSTOSCOPY N/A 11/25/2012   Procedure: CYSTOSCOPY;  Surgeon: Scott A MacDiarmid, MD;  Location:  SURGERY CENTER;  Service: Urology;  Laterality: N/A;  . dilatation and currettage  1964  . LEFT HEART CATHETERIZATION WITH CORONARY ANGIOGRAM N/A 09/03/2014   Procedure: LEFT HEART CATHETERIZATION WITH CORONARY ANGIOGRAM;  Surgeon: Jonathan J Berry, MD;  Location: [facility];  Service:   Cardiovascular;  Laterality: N/A;  . LUMBAR LAMINECTOMY/DECOMPRESSION MICRODISCECTOMY N/A 05/01/2013   Procedure: COMPLETE DECOMPRESSIVE LUMBAR LAMINECTOMY L3-L4 MICRODISCECTOMY L3-L4 ON THE LEFT;  Surgeon: Ronald A Gioffre, MD;  Location: [facility];  Service: Orthopedics;  Laterality: N/A;  . OVARIAN CYST REMOVAL  1964  . PUBOVAGINAL SLING N/A 11/25/2012   Procedure: PUBO-VAGINAL SLING;  Surgeon: Scott A MacDiarmid, MD;  Location: [facility];  Service: Urology;  Laterality: N/A;     Home Medications:  Prior to Admission medications   Medication Sig Start Date End Date Taking? Authorizing Provider  cholecalciferol (VITAMIN D) 1000 UNITS tablet Take 1,000 Units by mouth daily.     Yes [provider]  clopidogrel (PLAVIX) 75 MG tablet TAKE 1  TABLET (75 MG TOTAL) BY MOUTH DAILY. 04/10/17  Yes Duncan, Graham S, MD  Coenzyme Q10 (CO Q 10 PO) Take 1 tablet by mouth daily.   Yes [provider]  DULoxetine (CYMBALTA) 30 MG capsule Take 90 mg by mouth daily.    Yes [provider]  folic acid (FOLVITE) 800 MCG tablet Take 800 mcg by mouth daily.    Yes [provider]  gabapentin (NEURONTIN) 300 MG capsule Take 300-600 mg by mouth 3 (three) times daily. 300 mg twice daily and 600 mg in the evening   Yes [provider]  hydrocortisone 2.5 % ointment Apply 1 application topically daily as needed (rash).    Yes [provider]  KRILL OIL PO Take 100 mg by mouth daily.   Yes [provider]  lamoTRIgine (LAMICTAL) 100 MG tablet Take 100 mg by mouth at bedtime.   Yes [provider]  metoprolol succinate (TOPROL-XL) 25 MG 24 hr tablet TAKE 1 TABLET (25 MG TOTAL) BY MOUTH DAILY. 08/22/16  Yes Berry, Jonathan J, MD  OLANZapine (ZYPREXA) 2.5 MG tablet Take 1 tablet (2.5 mg total) by mouth at bedtime. 08/05/15  Yes Duncan, Graham S, MD  ondansetron (ZOFRAN ODT) 8 MG disintegrating tablet Take 1 tablet (8 mg total) by mouth every 8 (eight) hours as needed for nausea or vomiting. 01/22/17  Yes Steinl, Kevin, MD  traMADol (ULTRAM) 50 MG tablet Take 1 tablet (50 mg total) by mouth every 6 (six) hours as needed. Patient taking differently: Take 50 mg by mouth every 6 (six) hours as needed for moderate pain.  01/22/17  Yes Steinl, Kevin, MD    Inpatient Medications: Scheduled Meds: . cholecalciferol  1,000 Units Oral Daily  . clopidogrel  75 mg Oral Daily  . DULoxetine  90 mg Oral Daily  . enoxaparin (LOVENOX) injection  40 mg Subcutaneous Q24H  . folic acid  1,000 mcg Oral Q supper  . [START ON 06/15/2017] gabapentin  300 mg Oral BID  . gabapentin  600 mg Oral QHS  . lamoTRIgine  100 mg Oral QHS  . metoprolol succinate  25 mg Oral QHS  . OLANZapine  2.5 mg Oral QHS   Continuous  Infusions:  PRN Meds: acetaminophen, nitroGLYCERIN, ondansetron (ZOFRAN) IV, ondansetron, traMADol  Allergies:    Allergies  Allergen Reactions  . Digoxin Other (See Comments)    Felt weak, tired, "aweful"  . Nitrofurantoin Other (See Comments)    Sharp pain back of head  . Alendronate Sodium Other (See Comments)    cramps  . Simvastatin Other (See Comments)    Myalgia, possible intolerance  . Sulfonamide Derivatives Other (See Comments)    REACTION: (Maybe)-neck pain-brief  . Pravastatin Other (See Comments)      Myalgias  . Augmentin [Amoxicillin-Pot Clavulanate] Rash    In mouth  . Cefaclor Other (See Comments)    Felt bad  . Cephalexin Other (See Comments)    Felt bad, but able to tolerate amoxil prev  . Chlordiazepoxide Other (See Comments)    REACTION: (Maybe)  . Clavulanic Acid Other (See Comments)    Not an allergy.  Tolerates plain amoxil but not augmentin- diarrhea, etc with augmentin  . Omeprazole Rash    rash  . Oxcarbazepine Other (See Comments)    unknown  . Pb-Hyoscy-Atropine-Scopolamine Other (See Comments)    Unsure-doesn't remember  . Streptomycin Other (See Comments)    Lips itched  . Tetracycline Itching    itching  . Zetia [Ezetimibe] Other (See Comments)    possible cause of aches and abnormal dreams, stopped 05/2014    Social History:   Social History   Social History  . Marital status: Married    Spouse name: N/A  . Number of children: 4  . Years of education: N/A   Occupational History  . RETIRED Retired   Social History Main Topics  . Smoking status: Never Smoker  . Smokeless tobacco: Never Used     Comment: a small amount when 83 yo  . Alcohol use No  . Drug use: No  . Sexual activity: Not Currently   Other Topics Concern  . Not on file   Social History Narrative   Married 1953, husband is well.   5 pregnancies, 4 live births, all well (5th was unexpect pregnancy and patient needed to terminate due to concurrent medical  problems at the time)   11 grandchildren.  3 great grandchildren   Regular exercise:  No    Family History:    Family History  Problem Relation Age of Onset  . Arthritis Mother   . Stroke Father   . Hypertension Father   . Heart disease Brother        Died during CABG  . Hypertension Sister   . Dementia Sister   . Aneurysm Sister        Brain  . Parkinsonism Sister   . Kidney disease Maternal Grandmother   . Colon cancer Neg Hx   . Breast cancer Neg Hx      ROS:  Please see the history of present illness.  ROS  General:no colds or fevers, no weight changes Skin:no rashes or ulcers HEENT:no blurred vision, no congestion CV:see HPI PUL:see HPI GI:no diarrhea constipation or melena, no indigestion GU:no hematuria, no dysuria MS:+ joint pain, no claudication--+ fibromyalgia, jincreased leg pain bil and weakness Neuro:no syncope, no lightheadedness Endo:no diabetes, no thyroid disease Heme, hx of anemia with blood transfusions without out dx    Physical Exam/Data:   Vitals:   06/14/17 0600 06/14/17 0700 06/14/17 0816 06/14/17 0830  BP:  (!) 146/67 (!) 144/64 113/60  Pulse:  66 67 70  Resp:      Temp: 98.3 F (36.8 C) 98.3 F (36.8 C)    TempSrc: Oral Oral    SpO2:  96%    Weight:      Height:        Intake/Output Summary (Last 24 hours) at 06/14/17 0956 Last data filed at 06/14/17 0843  Gross per 24 hour  Intake                0 ml  Output             1600 ml  Net            -  1600 ml   Filed Weights   06/13/17 2215 06/14/17 0231  Weight: 178 lb (80.7 kg) 181 lb 3.2 oz (82.2 kg)   Body mass index is 34.24 kg/m.  General:  Well nourished, well developed, in no acute distress HEENT: normal Lymph: no adenopathy Neck: no JVD Endocrine:  No thryomegaly Vascular: No carotid bruits; 2 + pedal pulses bil  Cardiac:  normal S1, S2; RRR; no murmur, gallup rub or click  Lungs:  clear to auscultation bilaterally, no wheezing, rhonchi or rales  Abd: soft,  nontender, no hepatomegaly  Ext: no edema Musculoskeletal:  No deformities, BUE and BLE strength normal and equal Skin: warm and dry  Neuro:  Alert and oriented X 3 MAE follows commands, no focal abnormalities noted Psych:  Normal affect    Relevant CV Studies: Cardiac cath 09/03/14 ANGIOGRAPHIC RESULTS:   1. Left main; normal  2. LAD; 40-50% mid after a moderate size diagonal branch 3. Left circumflex; nondominant and normal.  4. Right coronary artery; dominant and normal 5. Left ventriculography; RAO left ventriculogram was performed using  25 mL of Visipaque dye at 12 mL/second. The overall LVEF estimated  60 %  Without wall motion abnormalities  IMPRESSION: Ms Donley has essentially normal coronary arteries with a intermediate mid LAD lesion but did not appear to be hemodynamically significant. I believe her chest pain is noncardiac and her Myoview false positive. Anterior was performed of her right common femoral artery and her puncture site was sealed with a Vascade closure device with excellent hemostasis. The patient left the laboratory stable condition. She'll be hydrated for 2 hours and discharged home. She will be seen back in our office by mid-level provider in 2-3 weeks.   Laboratory Data:  Chemistry Recent Labs Lab 06/13/17 2245 06/14/17 0125  NA 136  --   K 3.7  --   CL 102  --   CO2 27  --   GLUCOSE 104*  --   BUN 10  --   CREATININE 0.84 0.82  CALCIUM 9.0  --   GFRNONAA >60 >60  GFRAA >60 >60  ANIONGAP 7  --     No results for input(s): PROT, ALBUMIN, AST, ALT, ALKPHOS, BILITOT in the last 168 hours. Hematology Recent Labs Lab 06/13/17 2245 06/14/17 0125  WBC 7.3 7.1  RBC 4.16 3.94  HGB 13.4 12.9  HCT 40.9 38.4  MCV 98.3 97.5  MCH 32.2 32.7  MCHC 32.8 33.6  RDW 13.2 13.2  PLT 211 187   Cardiac Enzymes Recent Labs Lab 06/14/17 0125 06/14/17 0530  TROPONINI <0.03 <0.03    Recent Labs Lab 06/13/17 2251  TROPIPOC 0.00    BNPNo  results for input(s): BNP, PROBNP in the last 168 hours.  DDimer No results for input(s): DDIMER in the last 168 hours.  Radiology/Studies:  Dg Chest 2 View  Result Date: 06/13/2017 CLINICAL DATA:  Chest pain for the past 4 hours. EXAM: CHEST  2 VIEW COMPARISON:  08/12/2014. FINDINGS: Normal sized heart. Clear lungs with normal vascularity. Minimal peribronchial thickening. Stable right diaphragmatic eventration. Thoracic spine degenerative changes. IMPRESSION: Minimal bronchitic changes. Electronically Signed   By: Steven  Reid M.D.   On: 06/13/2017 23:09    Assessment and Plan:   1. Chest pain with neg MI, had mild episode this AM, relief of pain with NTG.  This could be related to Hiatal hernia but may be prudent to proceed with lexiscan myoivew.  Dr. Brodi Kari to see. 2. CAD with 50% mLAD   stenosis only 2016 with cath, chest pain at that time was not felt to be cardiac 3.  HTN controlled 4. OSA on cpapt  5. HLD stopped statins due to difficulty walking. 6. Hx bipolar disorder per IM 7. Hiatal hernia   For questions or updates, please contact [facility] Please consult [email or web address] for contact info under Cardiology/STEMI.   Signed, Laura Ingold, NP  06/14/2017 9:56 AM   Patient seen and examined  I agree with findings as noted by L INgold above  Pt is an 83 yo with history of mod CAD of LAD  Presents with CP since yesterday  Not pleuritic or positional  Somewhat atypical but Pt uncomfortable  Does note increased dyspnea with exertion. ON exam:  Lungs are CTA  Cardidac RRR no murmurs    No S3  Chest  Mild tenderness (does not mimic discomfort completely Ext No edema  Overall symptoms are not comlpletely typical but ocncerning  Needs eval  Given equivical myovue in past leading to cath I would recomm a CT angio of heart to evaluate for flow limiting lesions  Pt has known 50% LAD    Madex Seals    

## 2017-06-14 NOTE — Plan of Care (Signed)
Problem: Pain Managment: Goal: General experience of comfort will improve Outcome: Progressing Patient slept for rest of the night. No complaints of chest pain 0/10.

## 2017-06-14 NOTE — Progress Notes (Addendum)
Pt alert and oriented scored as a moderate fall risk and refusing bed alarm. Pt educated and still refused.

## 2017-06-14 NOTE — H&P (Signed)
History and Physical    Robin FieldMartha J Nessel ZOX:[id number] DOB: February 15, 1931 DOA: 06/13/2017  PCP: Joaquim Namuncan, Graham S, MD  Patient coming from: Home.  Chief Complaint: Chest pain.  HPI: Robin Juarez is a 83 y.o. female with history of hypertension, hyperlipidemia, bipolar disorder, sleep apnea presents to the ER because of chest pain.  Patient started having chest pain last evening around 6 PM while at home sitting.  Pain is retrosternal nonradiating no associated shortness of breath diaphoresis nausea vomiting or palpitations.  Lasted for around 4 hours.  EMS was called and patient was given aspirin and sublingual nitroglycerin following which patient chest pain resolved.  ED Course: In the ER chest x-ray was showing bronchitis changes and EKG was unremarkable troponin was negative.  Patient has had a cardiac cath in 2016 which showed 50% blockage mid LAD.  Patient is being admitted for ACS rule out.  Patient is presently chest pain-free.  Review of Systems: As per HPI, rest all negative.   Past Medical History:  Diagnosis Date  . Arthritis    knee and hip OA- prev injection by Dr. Ethelene Halamos  . Blood transfusion without reported diagnosis    37 yrs ago -s/p back surgery  . Chest pain   . Coronary artery disease    50% mid LAD by cath January 2016  . Depression 1993, 2000  . Fibromyalgia   . GERD (gastroesophageal reflux disease)   . H/O hiatal hernia   . Hyperlipidemia   . Hypertension   . OSA (obstructive sleep apnea)    dx 2010-had a cpap-does not use-"uses special mouthpiece now"  . Osteoporosis   . Positive cardiac stress test   . Stroke (HCC)   . Urinary incontinence   . UTI (lower urinary tract infection)     Past Surgical History:  Procedure Laterality Date  . ABDOMINAL HYSTERECTOMY  1976  . APPENDECTOMY  1964  . Back fusions  1977   missing vertebra  . BACK SURGERY     fusions lumbar area  . BREAST BIOPSY  1995   x 2  . BREAST BIOPSY  07/10/2012   Procedure: BREAST BIOPSY WITH NEEDLE LOCALIZATION;  Surgeon: Emelia LoronMatthew Wakefield, MD;  Location: [facility];  Service: General;  Laterality: Right;  . CARDIOVASCULAR STRESS TEST  2009   Normal, Dr. Mayford Knifeurner  . CATARACT EXTRACTION, BILATERAL Bilateral 2005  . Childbirth  [id number],1956,1957,1960  . CYSTOSCOPY N/A 11/25/2012   Procedure: CYSTOSCOPY;  Surgeon: Martina SinnerScott A MacDiarmid, MD;  [facility] [place];  Service: Urology;  Laterality: N/A;  . dilatation and currettage  1964  . LEFT HEART CATHETERIZATION WITH CORONARY ANGIOGRAM N/A 09/03/2014   Procedure: LEFT HEART CATHETERIZATION WITH CORONARY ANGIOGRAM;  Surgeon: Runell GessJonathan J Berry, MD;  [facility];  Service: Cardiovascular;  Laterality: N/A;  . LUMBAR LAMINECTOMY/DECOMPRESSION MICRODISCECTOMY N/A 05/01/2013   Procedure: COMPLETE DECOMPRESSIVE LUMBAR LAMINECTOMY L3-L4 MICRODISCECTOMY L3-L4 ON THE LEFT;  Surgeon: Jacki Conesonald A Gioffre, MD;  Location: [facility];  Service: Orthopedics;  Laterality: N/A;  . OVARIAN CYST REMOVAL  1964  . PUBOVAGINAL SLING N/A 11/25/2012   Procedure: Leonides GrillsPUBO-VAGINAL SLING;  Surgeon: Martina SinnerScott A MacDiarmid, MD;  [facility] [place];  Service: Urology;  Laterality: N/A;     reports that she has never smoked. She has never used smokeless tobacco. She reports that she does not drink alcohol or use drugs.  Allergies  Allergen Reactions  . Digoxin Other (See Comments)    Felt weak, tired, "  aweful"  . Nitrofurantoin Other (See Comments)    Sharp pain back of head  . Alendronate Sodium Other (See Comments)    cramps  . Simvastatin Other (See Comments)    Myalgia, possible intolerance  . Sulfonamide Derivatives Other (See Comments)    REACTION: (Maybe)-neck pain-brief  . Pravastatin Other (See Comments)    Myalgias  . Augmentin [Amoxicillin-Pot Clavulanate] Rash    In mouth  . Cefaclor Other (See Comments)    Felt bad  . Cephalexin Other (See Comments)    Felt bad, but able to  tolerate amoxil prev  . Chlordiazepoxide Other (See Comments)    REACTION: (Maybe)  . Clavulanic Acid Other (See Comments)    Not an allergy.  Tolerates plain amoxil but not augmentin- diarrhea, etc with augmentin  . Omeprazole Rash    rash  . Oxcarbazepine Other (See Comments)    unknown  . Pb-Hyoscy-Atropine-Scopolamine Other (See Comments)    Unsure-doesn't remember  . Streptomycin Other (See Comments)    Lips itched  . Tetracycline Itching    itching  . Zetia [Ezetimibe] Other (See Comments)    possible cause of aches and abnormal dreams, stopped 05/2014    Family History  Problem Relation Age of Onset  . Arthritis Mother   . Stroke Father   . Hypertension Father   . Heart disease Brother        Died during CABG  . Hypertension Sister   . Dementia Sister   . Aneurysm Sister        Brain  . Parkinsonism Sister   . Kidney disease Maternal Grandmother   . Colon cancer Neg Hx   . Breast cancer Neg Hx     Prior to Admission medications   Medication Sig Start Date End Date Taking? Authorizing Provider  cholecalciferol (VITAMIN D) 1000 UNITS tablet Take 1,000 Units by mouth daily.     Yes [provider]  clopidogrel (PLAVIX) 75 MG tablet TAKE 1 TABLET (75 MG TOTAL) BY MOUTH DAILY. 04/10/17  Yes Joaquim Nam, MD  Coenzyme Q10 (CO Q 10 PO) Take 1 tablet by mouth daily.   Yes [provider]  DULoxetine (CYMBALTA) 30 MG capsule Take 90 mg by mouth daily.    Yes [provider]  folic acid (FOLVITE) 800 MCG tablet Take 800 mcg by mouth daily.    Yes [provider]  gabapentin (NEURONTIN) 300 MG capsule Take 300-600 mg by mouth 3 (three) times daily. 300 mg twice daily and 600 mg in the evening   Yes [provider]  hydrocortisone 2.5 % ointment Apply 1 application topically daily as needed (rash).    Yes [provider]  KRILL OIL PO Take 100 mg by mouth daily.   Yes [provider]  lamoTRIgine (LAMICTAL) 100  MG tablet Take 100 mg by mouth at bedtime.   Yes [provider]  metoprolol succinate (TOPROL-XL) 25 MG 24 hr tablet TAKE 1 TABLET (25 MG TOTAL) BY MOUTH DAILY. 08/22/16  Yes Runell Gess, MD  OLANZapine (ZYPREXA) 2.5 MG tablet Take 1 tablet (2.5 mg total) by mouth at bedtime. 08/05/15  Yes Joaquim Nam, MD  ondansetron (ZOFRAN ODT) 8 MG disintegrating tablet Take 1 tablet (8 mg total) by mouth every 8 (eight) hours as needed for nausea or vomiting. 01/22/17  Yes Cathren Laine, MD  traMADol (ULTRAM) 50 MG tablet Take 1 tablet (50 mg total) by mouth every 6 (six) hours as needed. Patient taking  differently: Take 50 mg by mouth every 6 (six) hours as needed for moderate pain.  01/22/17  Yes Cathren Laine, MD    Physical Exam: Vitals:   06/13/17 2216 06/13/17 2300 06/13/17 2330 06/13/17 2340  BP: (!) 161/73 136/67 (!) 162/66 140/69  Pulse: 65 63 62 67  Resp: 18 15 18  (!) 22  Temp: 97.7 F (36.5 C)     TempSrc: Oral     SpO2: 97% (!) 88% 97% 95%  Weight:      Height:          Constitutional: Moderately built and nourished. Vitals:   06/13/17 2216 06/13/17 2300 06/13/17 2330 06/13/17 2340  BP: (!) 161/73 136/67 (!) 162/66 140/69  Pulse: 65 63 62 67  Resp: 18 15 18  (!) 22  Temp: 97.7 F (36.5 C)     TempSrc: Oral     SpO2: 97% (!) 88% 97% 95%  Weight:      Height:       Eyes: Anicteric no pallor. ENMT: No discharge from the ears eyes nose or mouth. Neck: No mass felt.  No JVD appreciated. Respiratory: No rhonchi or crepitations. Cardiovascular: S1-S2 heard no murmurs appreciated. Abdomen: Soft nontender bowel sounds present. Musculoskeletal: No edema.  No joint effusion. Skin: No rash.  Skin appears warm. Neurologic: Alert awake oriented to time place and person.  Moves all extremities. Psychiatric: Appears normal.  Normal affect.   Labs on Admission: I have personally reviewed following labs and imaging studies  CBC:  Recent Labs Lab 06/13/17 2245  WBC  7.3  HGB 13.4  HCT 40.9  MCV 98.3  PLT 211   Basic Metabolic Panel:  Recent Labs Lab 06/13/17 2245  NA 136  K 3.7  CL 102  CO2 27  GLUCOSE 104*  BUN 10  CREATININE 0.84  CALCIUM 9.0   GFR: Estimated Creatinine Clearance: 48.9 mL/min (by C-G formula based on SCr of 0.84 mg/dL). Liver Function Tests: No results for input(s): AST, ALT, ALKPHOS, BILITOT, PROT, ALBUMIN in the last 168 hours. No results for input(s): LIPASE, AMYLASE in the last 168 hours. No results for input(s): AMMONIA in the last 168 hours. Coagulation Profile: No results for input(s): INR, PROTIME in the last 168 hours. Cardiac Enzymes: No results for input(s): CKTOTAL, CKMB, CKMBINDEX, TROPONINI in the last 168 hours. BNP (last 3 results) No results for input(s): PROBNP in the last 8760 hours. HbA1C: No results for input(s): HGBA1C in the last 72 hours. CBG: No results for input(s): GLUCAP in the last 168 hours. Lipid Profile: No results for input(s): CHOL, HDL, LDLCALC, TRIG, CHOLHDL, LDLDIRECT in the last 72 hours. Thyroid Function Tests: No results for input(s): TSH, T4TOTAL, FREET4, T3FREE, THYROIDAB in the last 72 hours. Anemia Panel: No results for input(s): VITAMINB12, FOLATE, FERRITIN, TIBC, IRON, RETICCTPCT in the last 72 hours. Urine analysis:    Component Value Date/Time   COLORURINE STRAW (A) 01/22/2017 1200   APPEARANCEUR CLEAR 01/22/2017 1200   LABSPEC 1.005 01/22/2017 1200   PHURINE 6.0 01/22/2017 1200   GLUCOSEU NEGATIVE 01/22/2017 1200   HGBUR NEGATIVE 01/22/2017 1200   BILIRUBINUR negative 05/15/2017 1220   KETONESUR NEGATIVE 01/22/2017 1200   PROTEINUR negative 05/15/2017 1220   PROTEINUR NEGATIVE 01/22/2017 1200   UROBILINOGEN 0.2 05/15/2017 1220   UROBILINOGEN 0.2 08/12/2014 2227   NITRITE negative 05/15/2017 1220   NITRITE NEGATIVE 01/22/2017 1200   LEUKOCYTESUR Large (3+) (A) 05/15/2017 1220   Sepsis Labs: @LABRCNTIP (procalcitonin:4,lacticidven:4) )No results found  for this or  any previous visit (from the past 240 hour(s)).   Radiological Exams on Admission: Dg Chest 2 View  Result Date: 06/13/2017 CLINICAL DATA:  Chest pain for the past 4 hours. EXAM: CHEST  2 VIEW COMPARISON:  08/12/2014. FINDINGS: Normal sized heart. Clear lungs with normal vascularity. Minimal peribronchial thickening. Stable right diaphragmatic eventration. Thoracic spine degenerative changes. IMPRESSION: Minimal bronchitic changes. Electronically Signed   By: Beckie Salts M.D.   On: 06/13/2017 23:09    EKG: Independently reviewed.  Normal sinus rhythm.  Assessment/Plan Principal Problem:   Chest pain Active Problems:   OSA (obstructive sleep apnea)    1. Chest pain -concerning for angina given the history of patient having 50% stenosis in LAD during last cardiac cath in 2016.  Patient is chest pain-free after sublingual nitroglycerin.  We will cycle cardiac markers.  Patient is on Plavix.  Check 2D echo and cardiology consult has been requested. 2. Hypertension on metoprolol.  Appears to be uncontrolled closely monitor blood pressure trends. 3. Hyperlipidemia -patient stopped taking statins last week Dr. patient has some difficulty walking. 4. History of sleep apnea. 5. History of bipolar disorder -continue Lamictal, Zyprexa and Cymbalta.  I have reviewed patient's old charts and labs.   DVT prophylaxis: Lovenox. Code Status: Full code. Family Communication: Discussed with patient. Disposition Plan: Home. Consults called: Cardiology. Admission status: Observation.   Eduard Clos MD [facility] Pager [phone].  If 7PM-7AM, please contact night-coverage [email or web address] Password [email or web address]  06/14/2017, 12:45 AM

## 2017-06-14 NOTE — Progress Notes (Signed)
Patient ID: Robin Juarez, female   DOB: 1931/02/21, 83 y.o.   MRN: [id number] Patient was admitted early this morning for chest pain. Patient was seen and examined at bedside and I discussed the plan of care with the patient. Awaiting cardiology evaluation and 2-D echo.

## 2017-06-14 NOTE — Progress Notes (Signed)
Paged MD for chest pain 3/10. BP 144/64. 1 SL nitro given with relief. EKG performed. No new orders from MD at this time.

## 2017-06-14 NOTE — Progress Notes (Signed)
New Admission Note:   Arrival Method: Bed/from ED Mental Orientation: Alert and oriented x4 Telemetry:6 N Box 6 Assessment: Completed Skin: Intact Pain:0/10 Tubes: None Safety Measures: Safety Fall Prevention Plan has been discussed  Admission:  [address] Orientation: Patient has been orientated to the room, unit and staff.  Family: none at bedside  Orders to be reviewed and implemented. Call light has been placed within reach and bed alarm has been de-activated. Patient stated she is going to call when help is needed, she does not want bed alarm on. Patient has some home medications with her. Patient educated about policy, however she would like to keep her medications with her, when husband arrives today she will send them with husband home.  Will continue to monitor.  Robin Meany, RN Phone:

## 2017-06-14 NOTE — Progress Notes (Signed)
No new orders per MD. Checked on the patient, patient stated that she does not feel pain no more, 0/10. Will continue to monitor.  Deshun Sedivy, RN

## 2017-06-14 NOTE — Progress Notes (Signed)
EKG completed. [facility]  [facility]. Awaiting on possible orders.  Will continue to monitor.  Milo Schreier, RN

## 2017-06-15 ENCOUNTER — Observation Stay (HOSPITAL_COMMUNITY): Payer: Medicare Other

## 2017-06-15 ENCOUNTER — Ambulatory Visit (HOSPITAL_COMMUNITY)
Admission: EM | Disposition: A | Discharge status: Home / Self Care | Payer: Self-pay | Source: Home / Self Care | Attending: Emergency Medicine

## 2017-06-15 DIAGNOSIS — G4733 Obstructive sleep apnea (adult) (pediatric): Secondary | ICD-10-CM

## 2017-06-15 DIAGNOSIS — E785 Hyperlipidemia, unspecified: Secondary | ICD-10-CM

## 2017-06-15 DIAGNOSIS — I208 Other forms of angina pectoris: Secondary | ICD-10-CM | POA: Diagnosis not present

## 2017-06-15 DIAGNOSIS — R079 Chest pain, unspecified: Secondary | ICD-10-CM | POA: Diagnosis not present

## 2017-06-15 DIAGNOSIS — I10 Essential (primary) hypertension: Secondary | ICD-10-CM

## 2017-06-15 HISTORY — PX: LEFT HEART CATH AND CORONARY ANGIOGRAPHY: CATH118249

## 2017-06-15 LAB — CBC WITH DIFFERENTIAL/PLATELET
BASOS PCT: 1 %
Basophils Absolute: 0 10*3/uL (ref 0.0–0.1)
EOS ABS: 0.2 10*3/uL (ref 0.0–0.7)
EOS PCT: 3 %
HCT: 43.2 % (ref 36.0–46.0)
Hemoglobin: 14.3 g/dL (ref 12.0–15.0)
Lymphocytes Relative: 28 %
Lymphs Abs: 2.2 10*3/uL (ref 0.7–4.0)
MCH: 32.6 pg (ref 26.0–34.0)
MCHC: 33.1 g/dL (ref 30.0–36.0)
MCV: 98.6 fL (ref 78.0–100.0)
MONO ABS: 0.5 10*3/uL (ref 0.1–1.0)
MONOS PCT: 7 %
Neutro Abs: 4.7 10*3/uL (ref 1.7–7.7)
Neutrophils Relative %: 61 %
Platelets: 212 10*3/uL (ref 150–400)
RBC: 4.38 MIL/uL (ref 3.87–5.11)
RDW: 13.4 % (ref 11.5–15.5)
WBC: 7.7 10*3/uL (ref 4.0–10.5)

## 2017-06-15 LAB — BASIC METABOLIC PANEL
Anion gap: 8 (ref 5–15)
BUN: 13 mg/dL (ref 6–20)
CALCIUM: 9.2 mg/dL (ref 8.9–10.3)
CO2: 28 mmol/L (ref 22–32)
CREATININE: 0.86 mg/dL (ref 0.44–1.00)
Chloride: 105 mmol/L (ref 101–111)
GFR calc non Af Amer: >60 mL/min (ref >60)
Glucose, Bld: 97 mg/dL (ref 65–99)
Potassium: 3.7 mmol/L (ref 3.5–5.1)
SODIUM: 141 mmol/L (ref 135–145)

## 2017-06-15 LAB — PROTIME-INR
INR: 0.97
PROTHROMBIN TIME: 12.8 s (ref 11.4–15.2)

## 2017-06-15 LAB — MAGNESIUM: MAGNESIUM: 2.2 mg/dL (ref 1.7–2.4)

## 2017-06-15 SURGERY — LEFT HEART CATH AND CORONARY ANGIOGRAPHY
Anesthesia: LOCAL

## 2017-06-15 MED ORDER — SODIUM CHLORIDE 0.9% FLUSH
3.0000 mL | Freq: Two times a day (BID) | INTRAVENOUS | Status: DC
Start: 2017-06-15 — End: 2017-06-15
  Administered 2017-06-15: 3 mL via INTRAVENOUS

## 2017-06-15 MED ORDER — HEPARIN (PORCINE) IN NACL 2-0.9 UNIT/ML-% IJ SOLN
INTRAMUSCULAR | Status: AC | PRN
  Administered 2017-06-15: 1000 mL

## 2017-06-15 MED ORDER — SODIUM CHLORIDE 0.9 % IV SOLN: 250.0000 mL | INTRAVENOUS | Status: DC | PRN

## 2017-06-15 MED ORDER — MIDAZOLAM HCL 2 MG/2ML IJ SOLN
INTRAMUSCULAR | Status: AC
  Filled 2017-06-15: qty 2

## 2017-06-15 MED ORDER — LIDOCAINE HCL 2 % IJ SOLN
INTRAMUSCULAR | Status: AC
  Filled 2017-06-15: qty 20

## 2017-06-15 MED ORDER — LIDOCAINE HCL 2 % IJ SOLN
INTRAMUSCULAR | Status: DC | PRN
  Administered 2017-06-15: 15 mL

## 2017-06-15 MED ORDER — IOPAMIDOL (ISOVUE-370) INJECTION 76%
INTRAVENOUS | Status: AC
  Filled 2017-06-15: qty 100

## 2017-06-15 MED ORDER — ROSUVASTATIN CALCIUM 5 MG PO TABS
2.5000 mg | ORAL_TABLET | Freq: Every day | ORAL | Status: DC
  Administered 2017-06-15: 2.5 mg via ORAL
  Filled 2017-06-15: qty 0.5

## 2017-06-15 MED ORDER — SODIUM CHLORIDE 0.9% FLUSH: 3.0000 mL | INTRAVENOUS | Status: DC | PRN

## 2017-06-15 MED ORDER — SODIUM CHLORIDE 0.9 % IV SOLN: INTRAVENOUS | Status: DC

## 2017-06-15 MED ORDER — HEPARIN SODIUM (PORCINE) 5000 UNIT/ML IJ SOLN: 5000.0000 [IU] | Freq: Three times a day (TID) | INTRAMUSCULAR | Status: DC

## 2017-06-15 MED ORDER — ROSUVASTATIN CALCIUM 40 MG PO TABS
40.0000 mg | ORAL_TABLET | Freq: Every day | ORAL | Status: DC
  Administered 2017-06-16: 40 mg via ORAL
  Filled 2017-06-15: qty 1

## 2017-06-15 MED ORDER — HEPARIN (PORCINE) IN NACL 2-0.9 UNIT/ML-% IJ SOLN
INTRAMUSCULAR | Status: AC
  Filled 2017-06-15: qty 1000

## 2017-06-15 MED ORDER — FENTANYL CITRATE (PF) 100 MCG/2ML IJ SOLN
INTRAMUSCULAR | Status: AC
  Filled 2017-06-15: qty 2

## 2017-06-15 MED ORDER — FENTANYL CITRATE (PF) 100 MCG/2ML IJ SOLN
INTRAMUSCULAR | Status: DC | PRN
  Administered 2017-06-15: 25 ug via INTRAVENOUS

## 2017-06-15 MED ORDER — ACETAMINOPHEN 325 MG PO TABS
650.0000 mg | ORAL_TABLET | ORAL | Status: DC | PRN
Start: 2017-06-15 — End: 2017-06-16

## 2017-06-15 MED ORDER — MIDAZOLAM HCL 2 MG/2ML IJ SOLN
INTRAMUSCULAR | Status: DC | PRN
  Administered 2017-06-15: 2 mg via INTRAVENOUS

## 2017-06-15 MED ORDER — HEPARIN SODIUM (PORCINE) 5000 UNIT/ML IJ SOLN
5000.0000 [IU] | Freq: Three times a day (TID) | INTRAMUSCULAR | Status: DC
  Administered 2017-06-16: 5000 [IU] via SUBCUTANEOUS
  Filled 2017-06-15: qty 1

## 2017-06-15 MED ORDER — SODIUM CHLORIDE 0.9% FLUSH
3.0000 mL | Freq: Two times a day (BID) | INTRAVENOUS | Status: DC
  Administered 2017-06-15 – 2017-06-16 (×2): 3 mL via INTRAVENOUS

## 2017-06-15 MED ORDER — SODIUM CHLORIDE 0.9 % IV SOLN
INTRAVENOUS | Status: DC
  Administered 2017-06-15: 1000 mL via INTRAVENOUS

## 2017-06-15 MED ORDER — ASPIRIN 81 MG PO CHEW
81.0000 mg | CHEWABLE_TABLET | ORAL | Status: AC
  Administered 2017-06-15: 81 mg via ORAL
  Filled 2017-06-15: qty 1

## 2017-06-15 MED ORDER — ONDANSETRON HCL 4 MG/2ML IJ SOLN: 4.0000 mg | Freq: Four times a day (QID) | INTRAMUSCULAR | Status: DC | PRN

## 2017-06-15 MED ORDER — DIAZEPAM 5 MG PO TABS
5.0000 mg | ORAL_TABLET | Freq: Four times a day (QID) | ORAL | Status: DC | PRN
  Administered 2017-06-15 (×2): 5 mg via ORAL
  Filled 2017-06-15 (×3): qty 1

## 2017-06-15 SURGICAL SUPPLY — 7 items
CATH INFINITI 5FR MULTPACK ANG (CATHETERS) ×2 IMPLANT
KIT HEART LEFT (KITS) ×2 IMPLANT
PACK CARDIAC CATHETERIZATION (CUSTOM PROCEDURE TRAY) ×2 IMPLANT
SHEATH PINNACLE 5F 10CM (SHEATH) ×2 IMPLANT
TRANSDUCER W/STOPCOCK (MISCELLANEOUS) ×2 IMPLANT
TUBING CIL FLEX 10 FLL-RA (TUBING) ×2 IMPLANT
WIRE EMERALD 3MM-J .035X150CM (WIRE) ×2 IMPLANT

## 2017-06-15 NOTE — Discharge Instructions (Signed)
CK-MB Creatine kinase (CK) is an enzyme found in your muscle tissues. When your muscle tissue is damaged, CK is released into your blood. Creatine kinase-myocardial band (CK-MB) is a type of CK that is found mostly in heart muscle. Your health care provider may order a CK-MB blood test to determine if you have heart muscle damage caused by a heart attack. This test requires a blood sample from a vein in your hand or arm. CK-MB may become elevated as soon as 3-6 hours after you notice chest pain from a heart attack. A CK-MB test alone is not enough to diagnose a heart attack. Other conditions can also cause CK-MB to go up. You may also have other tests to help your health care provider make a diagnosis. CK-MB may often be done along with a total CK blood test.The CK-MB test may be repeated after a few hours or days. This test may also be referred to as creatine phosphokinase-myocardial band (CPK-MB). How do I prepare for this test? You may have this test as part of an emergency evaluation of your heart. Several other conditions and situations can cause CK-MB to go up. Tell your health care provider about:  Medicines you take.  Recent surgeries.  Recent muscle injury or heavy exercise.  Kidney disease.  Use of cocaine.  Heavy alcohol use.  What do the results mean? It is your responsibility to obtain your test results. Ask the lab or department performing the test when and how you will get your results. Contact your health care provider to discuss any questions you have about your results. Range of Normal Values Ranges for normal values vary among different labs and hospitals. You should always check with your health care provider after having lab work or other tests done to discuss whether your values are considered within normal limits. CK-MB levels can also vary widely between:  Men and women.  Different people.  The type of lab test used.  The normal value for CK-MB should be 0%,  so any level of CK-MB may be abnormal. If your CK-MB is 15-30% of your total CK, it may mean that you have recently had a heart attack. Meaning of Results Outside Normal Range Values Several other conditions can cause CK-MB to be outside the normal range. Your CK-MB level may be higher than normal if you have an injury to your skeletal muscles. High blood pressure and kidney disease can also cause CK-MB to go up. Sometimes vigorous exercise will increase CK-MB. In rare cases, a high level of CK-MB can be from low levels of thyroid hormone, alcohol abuse, or chronic muscle disease. Your health care provider will use your CK-MB blood test results along with other tests and exams to make a diagnosis. Talk with your health care provider to discuss your results, treatment options, and if necessary, the need for more tests. Talk with your health care provider if you have any questions about your results. This information is not intended to replace advice given to you by your health care provider. Make sure you discuss any questions you have with your health care provider. Document Released: 08/30/2004 Document Revised: 04/11/2016 Document Reviewed: 11/05/2013 Elsevier Interactive Patient Education  [employer]2018 [employer]

## 2017-06-15 NOTE — Progress Notes (Signed)
Patient ID: Robin Juarez, female   DOB: 1931/02/11, 83 y.o.   MRN: [id number]  PROGRESS NOTE    Robin Juarez  WUJ:[id number] DOB: 1930/11/04 DOA: 06/13/2017 PCP: Joaquim Nam, MD   Brief Narrative:  83 year old female with history of hypertension, hyperlipidemia, bipolar disorder, sleep apnea presented with chest pain. Cardiology evaluated the patient and she is planned for cardiac catheterization today.  Assessment & Plan:   Principal Problem:   Chest pain Active Problems:   OSA (obstructive sleep apnea)    1. Chest pain -concerning for angina. Cardiology following. Plan for cardiac catheterization today. Troponins negative. Currently chest pain-free. Echo showed EF of 60-65% with grade 1 diastolic dysfunction. Continue Plavix, statin, metoprolol.  2. Hypertension: Continue metoprolol. Monitor blood pressure 3. Hyperlipidemia -low-dose Crestor has been started by cardiology which will be continued. 4. History of sleep apnea. 5. History of bipolar disorder -continue Lamictal, Zyprexa and Cymbalta. Outpatient follow-up with psychiatry     DVT prophylaxis: Lovenox Code Status:  Full Family Communication: None at bedside Disposition Plan: Home once cleared by cardiology  Consultants: Cardiology   Procedures: Echo on 06/14/2017 ------------------------------------------------------------------- Study Conclusions  - Left ventricle: The cavity size was normal. Wall thickness was   normal. Systolic function was normal. The estimated ejection   fraction was in the range of 60% to 65%. Wall motion was normal;   there were no regional wall motion abnormalities. Doppler   parameters are consistent with abnormal left ventricular   relaxation (grade 1 diastolic dysfunction). - Mitral valve: Calcified annulus. - Atrial septum: There was increased thickness of the septum,   consistent with lipomatous hypertrophy.  Impressions:  - Normal LV systolic function;  mild diastolic dysfunction; trace MR   and TR.  Antimicrobials: None   Subjective: Patient seen and examined at bedside. She denies current chest pain, nausea or vomiting.  Objective: Vitals:   06/14/17 1207 06/14/17 1925 06/15/17 0500 06/15/17 0938  BP: (!) 158/65 (!) 148/86 (!) 155/70   Pulse: 75 79 79   Resp: 20 18 18    Temp: 98.2 F (36.8 C) 98.4 F (36.9 C) 98.2 F (36.8 C)   TempSrc: Oral Oral Oral   SpO2: 94% 95% 96% 94%  Weight:   80.8 kg (178 lb 1.6 oz)   Height:        Intake/Output Summary (Last 24 hours) at 06/15/17 1405 Last data filed at 06/15/17 1000  Gross per 24 hour  Intake              540 ml  Output              940 ml  Net             -400 ml   Filed Weights   06/13/17 2215 06/14/17 0231 06/15/17 0500  Weight: 80.7 kg (178 lb) 82.2 kg (181 lb 3.2 oz) 80.8 kg (178 lb 1.6 oz)    Examination:  General exam: Appears calm and comfortable  Respiratory system: Bilateral decreased breath sound at bases Cardiovascular system: S1 & S2 heard, rate controlled  Gastrointestinal system: Abdomen is nondistended, soft and nontender. Normal bowel sounds heard. Extremities: No cyanosis, clubbing, edema    Data Reviewed: I have personally reviewed following labs and imaging studies  CBC:  Recent Labs Lab 06/13/17 2245 06/14/17 0125 06/15/17 0445  WBC 7.3 7.1 7.7  NEUTROABS  --   --  4.7  HGB 13.4 12.9 14.3  HCT 40.9 38.4 43.2  MCV  98.3 97.5 98.6  PLT 211 187 212   Basic Metabolic Panel:  Recent Labs Lab 06/13/17 2245 06/14/17 0125 06/15/17 0445  NA 136  --  141  K 3.7  --  3.7  CL 102  --  105  CO2 27  --  28  GLUCOSE 104*  --  97  BUN 10  --  13  CREATININE 0.84 0.82 0.86  CALCIUM 9.0  --  9.2  MG  --   --  2.2   GFR: Estimated Creatinine Clearance: 47.7 mL/min (by C-G formula based on SCr of 0.86 mg/dL). Liver Function Tests: No results for input(s): AST, ALT, ALKPHOS, BILITOT, PROT, ALBUMIN in the last 168 hours. No results for  input(s): LIPASE, AMYLASE in the last 168 hours. No results for input(s): AMMONIA in the last 168 hours. Coagulation Profile:  Recent Labs Lab 06/15/17 0549  INR 0.97   Cardiac Enzymes:  Recent Labs Lab 06/14/17 0125 06/14/17 0530 06/14/17 1218  TROPONINI <0.03 <0.03 <0.03   BNP (last 3 results) No results for input(s): PROBNP in the last 8760 hours. HbA1C: No results for input(s): HGBA1C in the last 72 hours. CBG: No results for input(s): GLUCAP in the last 168 hours. Lipid Profile: No results for input(s): CHOL, HDL, LDLCALC, TRIG, CHOLHDL, LDLDIRECT in the last 72 hours. Thyroid Function Tests: No results for input(s): TSH, T4TOTAL, FREET4, T3FREE, THYROIDAB in the last 72 hours. Anemia Panel: No results for input(s): VITAMINB12, FOLATE, FERRITIN, TIBC, IRON, RETICCTPCT in the last 72 hours. Sepsis Labs: No results for input(s): PROCALCITON, LATICACIDVEN in the last 168 hours.  No results found for this or any previous visit (from the past 240 hour(s)).       Radiology Studies: Dg Chest 2 View  Result Date: 06/13/2017 CLINICAL DATA:  Chest pain for the past 4 hours. EXAM: CHEST  2 VIEW COMPARISON:  08/12/2014. FINDINGS: Normal sized heart. Clear lungs with normal vascularity. Minimal peribronchial thickening. Stable right diaphragmatic eventration. Thoracic spine degenerative changes. IMPRESSION: Minimal bronchitic changes. Electronically Signed   By: Beckie SaltsSteven  Reid M.D.   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 EXAM: Cardiac/Coronary  CT TECHNIQUE: The patient was scanned on a [employer] scanner. FINDINGS: A 120 kV prospective scan was triggered in the descending thoracic aorta at 111 HU's. Axial non-contrast 3 mm  slices were carried out through the heart. The data set was analyzed on a dedicated work station and scored using the Agatson method. Gantry rotation speed was 250 msecs and collimation was .6 mm. No beta blockade and 0.8 mg of sl NTG was given. The 3D data set was reconstructed in 5% intervals of the 67-82 % of the R-R cycle. Diastolic phases were analyzed on a dedicated work station using MPR, MIP and VRT modes. The patient received 80 cc of contrast. Aorta:  Normal size.  No calcifications.  No dissection. Aortic Valve:  Trileaflet.  No calcifications. Coronary Arteries:  Normal coronary origin.  Right dominance. RCA is a large dominant artery that gives rise to PDA and PLVB. There minimal plaque in RCA and PDA. Left main is a large artery that gives rise to LAD and LCX arteries. There is no plaque. LAD is a large vessel that gives rise to one diagonal  artery. Proximal LAD has a long eccentric calcified plaque with associated stenosis 25-50%. Mid LAD has severe complex, predominantly circumferential calcified plaque with associated stenosis > 70%. After the stenosis the LAD lumen narrows significantly. D1 is rather small and originated from calcified plaque. It appears to have severe ostial stenosis. LCX is a medium size non-dominant artery that has mild calcified plaque in the proximal and mid portion with associated stenosis 25-50%. Other findings: Normal pulmonary vein drainage into the left atrium. Normal let atrial appendage without a thrombus. Normal size of the pulmonary artery. IMPRESSION: 1. Coronary calcium score of 699. This was 70 percentile for age and sex matched control. 2. Normal coronary origin with right dominance. 3. Moderate long plaque in the proximal LAD with stenosis 25-50%. Severe complex, predominantly circumferential calcified plaque in the mid LAD with associated stenosis > 70%. Possible severe stenosis in the ostial diagonal artery. Mild disease in non-dominant LCX artery and minimal  RCA disease. Additional CT FFR analysis will be submitted because of severe calcifications and possible overestimation of stenosis severity. Tobias Alexander Electronically Signed   By: Tobias Alexander   On: 06/14/2017 16:58   Result Date: 06/14/2017 EXAM: OVER-READ INTERPRETATION  CT CHEST The following report is an over-read performed by radiologist Dr. Maryelizabeth Rowan [facility] on 06/14/2017. This over-read does not include interpretation of cardiac or coronary anatomy or pathology. The coronary CTA interpretation by the cardiologist is attached. COMPARISON:  None. FINDINGS: Limited view of the lung parenchyma demonstrates the 3 mm nodule in the RIGHT middle lobe (image 18, series 12). Airways are normal. Limited view of the mediastinum demonstrates no adenopathy. Esophagus normal. Limited view of the upper abdomen unremarkable. Limited view of the skeleton and chest wall is unremarkable. Degenerate spurring of the spine. IMPRESSION: 1. Small RIGHT middle lobe pulmonary nodule. No follow-up needed if patient is low-risk. Non-contrast chest CT can be considered in 12 months if patient is high-risk. This recommendation follows the consensus statement: Guidelines for Management of Incidental Pulmonary Nodules Detected on CT Images: From the Fleischner Society 2017; Radiology 2017; 284:228-243. 2.  Degenerative osteophytosis of the spine. Electronically Signed: By: Genevive Bi M.D. On: 06/14/2017 15:17   Ct Coronary Fractional Flow Reserve Fluid Analysis  Result Date: 06/15/2017 EXAM: FF/RCT ANALYSIS FINDINGS: FFRct analysis was performed on the original cardiac CT angiogram dataset. Diagrammatic representation of the FFRct analysis is provided in a separate PDF document in PACS. This dictation was created using the PDF document and an interactive 3D model of the results. 3D model is not available in the EMR/PACS. Normal FFR range is >0.80. 1. Left Main:  No significant stenosis. 2. LAD:  No significant stenosis. 3. LCX:  Proximal CT FFR:  Normal, mid CT FFR 0.75 and less. 4. RCA: No significant stenosis. IMPRESSION: 1. CT FFR analysis showed a significant stenosis in the mid to distal LAD. A cardiac catheterization is recommended. Electronically Signed   By: Tobias Alexander   On: 06/15/2017 12:23        Scheduled Meds: . cholecalciferol  1,000 Units Oral Daily  . clopidogrel  75 mg Oral Daily  . DULoxetine  90 mg Oral Daily  . enoxaparin (LOVENOX) injection  40 mg Subcutaneous Q24H  . folic acid  1,000 mcg Oral Q supper  . gabapentin  300 mg Oral BID AC  . gabapentin  600 mg Oral QHS  . lamoTRIgine  100 mg Oral QHS  . metoprolol succinate  25 mg Oral QHS  .  OLANZapine  2.5 mg Oral QHS  . rosuvastatin  2.5 mg Oral Daily  . sodium chloride flush  3 mL Intravenous Q12H   Continuous Infusions: . sodium chloride    . sodium chloride 1,000 mL (06/15/17 0609)     LOS: 0 days        Glade Lloyd, MD [facility] Pager [phone]  If 7PM-7AM, please contact night-coverage [email or web address] Password [email or web address] 06/15/2017, 2:05 PM

## 2017-06-15 NOTE — Progress Notes (Addendum)
Site area: Right groin a 5 french arterial sheath was removed  Site Prior to Removal:  Level 0  Pressure Applied For 30 MINUTES    Bedrest Beginning at 1605p  Manual:   Yes.    Patient Status During Pull:  stable  Post Pull Groin Site:  Level 0  Post Pull Instructions Given:  Yes.    Post Pull Pulses Present:  Yes.    Dressing Applied:  Yes.  Pressure dressing applied  Comments:  VS remain stable

## 2017-06-15 NOTE — Progress Notes (Signed)
Progress Note  Patient Name: Robin Juarez Date of Encounter: 06/15/2017  Primary Cardiologist: Dr. Erlene Quan   Subjective   No chest pain and no SOB, no questions about cardiac cath.  Inpatient Medications    Scheduled Meds: . cholecalciferol  1,000 Units Oral Daily  . clopidogrel  75 mg Oral Daily  . DULoxetine  90 mg Oral Daily  . enoxaparin (LOVENOX) injection  40 mg Subcutaneous Q24H  . folic acid  1,000 mcg Oral Q supper  . gabapentin  300 mg Oral BID AC  . gabapentin  600 mg Oral QHS  . lamoTRIgine  100 mg Oral QHS  . metoprolol succinate  25 mg Oral QHS  . OLANZapine  2.5 mg Oral QHS  . sodium chloride flush  3 mL Intravenous Q12H   Continuous Infusions: . sodium chloride    . sodium chloride 1,000 mL (06/15/17 0609)   PRN Meds: sodium chloride, acetaminophen, nitroGLYCERIN, ondansetron (ZOFRAN) IV, ondansetron, sodium chloride flush, traMADol   Vital Signs    Vitals:   06/14/17 0830 06/14/17 1207 06/14/17 1925 06/15/17 0500  BP: 113/60 (!) 158/65 (!) 148/86 (!) 155/70  Pulse: 70 75 79 79  Resp:  20 18 18   Temp:  98.2 F (36.8 C) 98.4 F (36.9 C) 98.2 F (36.8 C)  TempSrc:  Oral Oral Oral  SpO2:  94% 95% 96%  Weight:    178 lb 1.6 oz (80.8 kg)  Height:        Intake/Output Summary (Last 24 hours) at 06/15/17 0836 Last data filed at 06/15/17 0500  Gross per 24 hour  Intake              480 ml  Output             1440 ml  Net             -960 ml   Filed Weights   06/13/17 2215 06/14/17 0231 06/15/17 0500  Weight: 178 lb (80.7 kg) 181 lb 3.2 oz (82.2 kg) 178 lb 1.6 oz (80.8 kg)    Telemetry    SR - Personally Reviewed  ECG    No new - Personally Reviewed  Physical Exam   GEN: No acute distress.   Neck: No JVD Cardiac: RRR, no murmurs, rubs, or gallops.  Respiratory: Clear to auscultation bilaterally. GI: Soft, nontender, non-distended  MS: No edema; No deformity. Neuro:  Nonfocal  Psych: Normal affect   Labs     Chemistry Recent Labs Lab 06/13/17 2245 06/14/17 0125 06/15/17 0445  NA 136  --  141  K 3.7  --  3.7  CL 102  --  105  CO2 27  --  28  GLUCOSE 104*  --  97  BUN 10  --  13  CREATININE 0.84 0.82 0.86  CALCIUM 9.0  --  9.2  GFRNONAA >60 >60 >60  GFRAA >60 >60 >60  ANIONGAP 7  --  8     Hematology Recent Labs Lab 06/13/17 2245 06/14/17 0125 06/15/17 0445  WBC 7.3 7.1 7.7  RBC 4.16 3.94 4.38  HGB 13.4 12.9 14.3  HCT 40.9 38.4 43.2  MCV 98.3 97.5 98.6  MCH 32.2 32.7 32.6  MCHC 32.8 33.6 33.1  RDW 13.2 13.2 13.4  PLT 211 187 212    Cardiac Enzymes Recent Labs Lab 06/14/17 0125 06/14/17 0530 06/14/17 1218  TROPONINI <0.03 <0.03 <0.03    Recent Labs Lab 06/13/17 2251  TROPIPOC 0.00  BNPNo results for input(s): BNP, PROBNP in the last 168 hours.   DDimer No results for input(s): DDIMER in the last 168 hours.   Radiology    Dg Chest 2 View  Result Date: 06/13/2017 CLINICAL DATA:  Chest pain for the past 4 hours. EXAM: CHEST  2 VIEW COMPARISON:  08/12/2014. FINDINGS: Normal sized heart. Clear lungs with normal vascularity. Minimal peribronchial thickening. Stable right diaphragmatic eventration. Thoracic spine degenerative changes. IMPRESSION: Minimal bronchitic changes. Electronically Signed   By: Beckie SaltsSteven  Reid M.D.   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 EXAM: Cardiac/Coronary  CT TECHNIQUE: The patient was scanned on a [employer] scanner. FINDINGS: A 120 kV prospective scan was triggered in the descending thoracic aorta at 111 HU's. Axial non-contrast 3 mm slices were carried out through the heart. The data set was analyzed on a dedicated work station and scored using the Agatson method. Gantry rotation  speed was 250 msecs and collimation was .6 mm. No beta blockade and 0.8 mg of sl NTG was given. The 3D data set was reconstructed in 5% intervals of the 67-82 % of the R-R cycle. Diastolic phases were analyzed on a dedicated work station using MPR, MIP and VRT modes. The patient received 80 cc of contrast. Aorta:  Normal size.  No calcifications.  No dissection. Aortic Valve:  Trileaflet.  No calcifications. Coronary Arteries:  Normal coronary origin.  Right dominance. RCA is a large dominant artery that gives rise to PDA and PLVB. There minimal plaque in RCA and PDA. Left main is a large artery that gives rise to LAD and LCX arteries. There is no plaque. LAD is a large vessel that gives rise to one diagonal artery. Proximal LAD has a long eccentric calcified plaque with associated stenosis 25-50%. Mid LAD has severe complex, predominantly circumferential calcified plaque with associated stenosis > 70%. After the stenosis the LAD lumen narrows significantly. D1 is rather small and originated from calcified plaque. It appears to have severe ostial stenosis. LCX is a medium size non-dominant artery that has mild calcified plaque in the proximal and mid portion with associated stenosis 25-50%. Other findings: Normal pulmonary vein drainage into the left atrium. Normal let atrial appendage without a thrombus. Normal size of the pulmonary artery. IMPRESSION: 1. Coronary calcium score of 699. This was 5082 percentile for age and sex matched control. 2. Normal coronary origin with right dominance. 3. Moderate long plaque in the proximal LAD with stenosis 25-50%. Severe complex, predominantly circumferential calcified plaque in the mid LAD with associated stenosis > 70%. Possible severe stenosis in the ostial diagonal artery. Mild disease in non-dominant LCX artery and minimal RCA disease. Additional CT FFR analysis will be submitted because of severe calcifications and possible overestimation of stenosis severity. Tobias AlexanderKatarina  Nelson Electronically Signed   By: Tobias AlexanderKatarina  Nelson   On: 06/14/2017 16:58   Result Date: 06/14/2017 EXAM: OVER-READ INTERPRETATION  CT CHEST The following report is an over-read performed by radiologist Dr. Maryelizabeth RowanStewart Edmundsof [facility] on 06/14/2017. This over-read does not include interpretation of cardiac or coronary anatomy or pathology. The coronary CTA interpretation by the cardiologist is attached. COMPARISON:  None. FINDINGS: Limited view of the lung parenchyma demonstrates the 3 mm nodule in the RIGHT middle lobe (image  18, series 12). Airways are normal. Limited view of the mediastinum demonstrates no adenopathy. Esophagus normal. Limited view of the upper abdomen unremarkable. Limited view of the skeleton and chest wall is unremarkable. Degenerate spurring of the spine. IMPRESSION: 1. Small RIGHT middle lobe pulmonary nodule. No follow-up needed if patient is low-risk. Non-contrast chest CT can be considered in 12 months if patient is high-risk. This recommendation follows the consensus statement: Guidelines for Management of Incidental Pulmonary Nodules Detected on CT Images: From the Fleischner Society 2017; Radiology 2017; 284:228-243. 2.  Degenerative osteophytosis of the spine. Electronically Signed: By: Genevive Bi M.D. On: 06/14/2017 15:17    Cardiac Studies   ECHO 06/14/17  Study Conclusions  - Left ventricle: The cavity size was normal. Wall thickness was   normal. Systolic function was normal. The estimated ejection   fraction was in the range of 60% to 65%. Wall motion was normal;   there were no regional wall motion abnormalities. Doppler   parameters are consistent with abnormal left ventricular   relaxation (grade 1 diastolic dysfunction). - Mitral valve: Calcified annulus. - Atrial septum: There was increased thickness of the septum,   consistent with lipomatous hypertrophy.  Impressions:  - Normal LV systolic function; mild diastolic  dysfunction; trace MR   and TR.  Cardiac CT 06/14/17  IMPRESSION: 1. Coronary calcium score of 699. This was 8 percentile for age and sex matched control.  2. Normal coronary origin with right dominance.  3. Moderate long plaque in the proximal LAD with stenosis 25-50%. Severe complex, predominantly circumferential calcified plaque in the mid LAD with associated stenosis > 70%.  Possible severe stenosis in the ostial diagonal artery.  Mild disease in non-dominant LCX artery and minimal RCA disease.  Additional CT FFR analysis will be submitted because of severe calcifications and possible overestimation of stenosis severity.  Patient Profile     83 y.o. female with a hx of HLD, HTN, hx of CVA, also cath with 50% mLAD 2016 and FH with brother with CAD, admitted with chest pain.  No MI.    Assessment & Plan    1. Chest pain with neg MI, had mild episode day of admit, relief of pain with NTG.  This could be related to Hiatal hernia underwent Cardiac CT with elevated Ca score and stenosis -awaiting FFR.  Pt NPO for cardiac cath today 2. CAD with 50% mLAD stenosis only 2016 with cath, chest pain at that time was not felt to be cardiac 3.  HTN controlled 4. OSA on cpap  5. HLD stopped statins due to difficulty walking. Was on pravastatin  WOuld try Crestor 2.5 mg to start 6. Hx bipolar disorder per IM 7. Hiatal hernia   For questions or updates, please contact [facility] Please consult [email or web address] for contact info under Cardiology/STEMI.      Signed, Nada Boozer, NP  06/15/2017, 8:36 AM    PT seen  Agree with assessment as noted above  Pt currently asymptomatic CT angio findings as noted above  Pt needs L heart cath today  Dietrich Pates

## 2017-06-15 NOTE — H&P (View-Only) (Signed)
Cardiology Consultation:   Patient ID: Robin Juarez; [id number]; 29-Jun-1931   Admit date: 06/13/2017 Date of Consult: 06/14/2017  Primary Care Provider: Joaquim Nam, MD Primary Cardiologist: Dr. Erlene Juarez Primary Electrophysiologist:  NA   Patient Profile:   Robin Juarez is a 83 y.o. female with a hx of HLD, HTN, hx of CVA, also cath with 50% mLAD 2016 and FH with brother with CAD who is being seen today for the evaluation of chest pain at the request of Dr. Hanley Juarez.  History of Present Illness:   Robin Juarez has  a hx of HLD, HTN, hx of CVA, also cath with 50% mLAD 2016 and FH with brother with CAD and previous chest pain not felt to be cardiac.  Now admitted 06/13/17 with chest pain.  Started yesterday and she had not had any problems before.  She does have Hiatal hernia and pain is similar to her HH but when she drank water it did not go away.  After 3 hours EMS called.  NTG X 2 with relief.   Pain with radiation to her back mildly.  No nausea or SOB.    EKG on admit I personally reviewed SR no changes from 2017 and today SR normal EKG  Tele I personally reviewed. SR  Troponin 0.00 <0.03, <0.03 K+ 3.7, Cr 0.84 WBC 7.1, hgb 13.4  CXR  Minimal bronchitic changes  Currently she had some mild pain this AM but none currently.  She did stop her statin due to leg pain and difficulty moving, pravastatin.  Initially held for 10 days then was to resume but she did not.  Has difficulty ambulating.  Also with chronic back pain.  Past Medical History:  Diagnosis Date  . Arthritis    knee and hip OA- prev injection by Dr. Ethelene Juarez  . Blood transfusion without reported diagnosis    37 yrs ago -s/p back surgery  . Chest pain   . Coronary artery disease    50% mid LAD by cath January 2016  . Depression 1993, 2000  . Fibromyalgia   . GERD (gastroesophageal reflux disease)   . H/O hiatal hernia   . Hyperlipidemia   . Hypertension   . OSA (obstructive sleep apnea)    dx  2010-had a cpap-does not use-"uses special mouthpiece now"  . Osteoporosis   . Positive cardiac stress test   . Stroke (HCC)   . Urinary incontinence   . UTI (lower urinary tract infection)     Past Surgical History:  Procedure Laterality Date  . ABDOMINAL HYSTERECTOMY  1976  . APPENDECTOMY  1964  . Back fusions  1977   missing vertebra  . BACK SURGERY     fusions lumbar area  . BREAST BIOPSY  1995   x 2  . BREAST BIOPSY  07/10/2012   Procedure: BREAST BIOPSY WITH NEEDLE LOCALIZATION;  Surgeon: Robin Loron, MD;  Location: [facility];  Service: General;  Laterality: Right;  . CARDIOVASCULAR STRESS TEST  2009   Normal, Dr. Mayford Juarez  . CATARACT EXTRACTION, BILATERAL Bilateral 2005  . Childbirth  [phone]  . CYSTOSCOPY N/A 11/25/2012   Procedure: CYSTOSCOPY;  Surgeon: Robin Sinner, MD;  Location: [facility];  Service: Urology;  Laterality: N/A;  . dilatation and currettage  1964  . LEFT HEART CATHETERIZATION WITH CORONARY ANGIOGRAM N/A 09/03/2014   Procedure: LEFT HEART CATHETERIZATION WITH CORONARY ANGIOGRAM;  Surgeon: Robin Gess, MD;  Location[facility] CATH LAB;  Service:  Cardiovascular;  Laterality: N/A;  . LUMBAR LAMINECTOMY/DECOMPRESSION MICRODISCECTOMY N/A 05/01/2013   Procedure: COMPLETE DECOMPRESSIVE LUMBAR LAMINECTOMY L3-L4 MICRODISCECTOMY L3-L4 ON THE LEFT;  Surgeon: Robin Conesonald A Gioffre, MD;  Location: [facility];  Service: Orthopedics;  Laterality: N/A;  . OVARIAN CYST REMOVAL  1964  . PUBOVAGINAL SLING N/A 11/25/2012   Procedure: Leonides GrillsPUBO-VAGINAL SLING;  Surgeon: Robin SinnerScott A MacDiarmid, MD;  [facility] ;  Service: Urology;  Laterality: N/A;     Home Medications:  Prior to Admission medications   Medication Sig Start Date End Date Taking? Authorizing Provider  cholecalciferol (VITAMIN D) 1000 UNITS tablet Take 1,000 Units by mouth daily.     Yes [provider]  clopidogrel (PLAVIX) 75 MG tablet TAKE 1  TABLET (75 MG TOTAL) BY MOUTH DAILY. 04/10/17  Yes Robin Juarez, Robin S, MD  Coenzyme Q10 (CO Q 10 PO) Take 1 tablet by mouth daily.   Yes [provider]  DULoxetine (CYMBALTA) 30 MG capsule Take 90 mg by mouth daily.    Yes [provider]  folic acid (FOLVITE) 800 MCG tablet Take 800 mcg by mouth daily.    Yes [provider]  gabapentin (NEURONTIN) 300 MG capsule Take 300-600 mg by mouth 3 (three) times daily. 300 mg twice daily and 600 mg in the evening   Yes [provider]  hydrocortisone 2.5 % ointment Apply 1 application topically daily as needed (rash).    Yes [provider]  KRILL OIL PO Take 100 mg by mouth daily.   Yes [provider]  lamoTRIgine (LAMICTAL) 100 MG tablet Take 100 mg by mouth at bedtime.   Yes [provider]  metoprolol succinate (TOPROL-XL) 25 MG 24 hr tablet TAKE 1 TABLET (25 MG TOTAL) BY MOUTH DAILY. 08/22/16  Yes Robin GessBerry, Robin J, MD  OLANZapine (ZYPREXA) 2.5 MG tablet Take 1 tablet (2.5 mg total) by mouth at bedtime. 08/05/15  Yes Robin Juarez, Robin S, MD  ondansetron (ZOFRAN ODT) 8 MG disintegrating tablet Take 1 tablet (8 mg total) by mouth every 8 (eight) hours as needed for nausea or vomiting. 01/22/17  Yes Cathren Juarez, Kevin, MD  traMADol (ULTRAM) 50 MG tablet Take 1 tablet (50 mg total) by mouth every 6 (six) hours as needed. Patient taking differently: Take 50 mg by mouth every 6 (six) hours as needed for moderate pain.  01/22/17  Yes Cathren Juarez, Kevin, MD    Inpatient Medications: Scheduled Meds: . cholecalciferol  1,000 Units Oral Daily  . clopidogrel  75 mg Oral Daily  . DULoxetine  90 mg Oral Daily  . enoxaparin (LOVENOX) injection  40 mg Subcutaneous Q24H  . folic acid  1,000 mcg Oral Q supper  . [START ON 06/15/2017] gabapentin  300 mg Oral BID  . gabapentin  600 mg Oral QHS  . lamoTRIgine  100 mg Oral QHS  . metoprolol succinate  25 mg Oral QHS  . OLANZapine  2.5 mg Oral QHS   Continuous  Infusions:  PRN Meds: acetaminophen, nitroGLYCERIN, ondansetron (ZOFRAN) IV, ondansetron, traMADol  Allergies:    Allergies  Allergen Reactions  . Digoxin Other (See Comments)    Felt weak, tired, "aweful"  . Nitrofurantoin Other (See Comments)    Sharp pain back of head  . Alendronate Sodium Other (See Comments)    cramps  . Simvastatin Other (See Comments)    Myalgia, possible intolerance  . Sulfonamide Derivatives Other (See Comments)    REACTION: (Maybe)-neck pain-brief  . Pravastatin Other (See Comments)  Myalgias  . Augmentin [Amoxicillin-Pot Clavulanate] Rash    In mouth  . Cefaclor Other (See Comments)    Felt bad  . Cephalexin Other (See Comments)    Felt bad, but able to tolerate amoxil prev  . Chlordiazepoxide Other (See Comments)    REACTION: (Maybe)  . Clavulanic Acid Other (See Comments)    Not an allergy.  Tolerates plain amoxil but not augmentin- diarrhea, etc with augmentin  . Omeprazole Rash    rash  . Oxcarbazepine Other (See Comments)    unknown  . Pb-Hyoscy-Atropine-Scopolamine Other (See Comments)    Unsure-doesn't remember  . Streptomycin Other (See Comments)    Lips itched  . Tetracycline Itching    itching  . Zetia [Ezetimibe] Other (See Comments)    possible cause of aches and abnormal dreams, stopped 05/2014    Social History:   Social History   Social History  . Marital status: Married    Spouse name: N/A  . Number of children: 4  . Years of education: N/A   Occupational History  . RETIRED Retired   Social History Main Topics  . Smoking status: Never Smoker  . Smokeless tobacco: Never Used     Comment: a small amount when 83 yo  . Alcohol use No  . Drug use: No  . Sexual activity: Not Currently   Other Topics Concern  . Not on file   Social History Narrative   Married 1953, husband is well.   5 pregnancies, 4 live births, all well (5th was unexpect pregnancy and patient needed to terminate due to concurrent medical  problems at the time)   11 grandchildren.  3 great grandchildren   Regular exercise:  No    Family History:    Family History  Problem Relation Age of Onset  . Arthritis Mother   . Stroke Father   . Hypertension Father   . Heart disease Brother        Died during CABG  . Hypertension Sister   . Dementia Sister   . Aneurysm Sister        Brain  . Parkinsonism Sister   . Kidney disease Maternal Grandmother   . Colon cancer Neg Hx   . Breast cancer Neg Hx      ROS:  Please see the history of present illness.  ROS  General:no colds or fevers, no weight changes Skin:no rashes or ulcers HEENT:no blurred vision, no congestion CV:see HPI PUL:see HPI GI:no diarrhea constipation or melena, no indigestion GU:no hematuria, no dysuria MS:+ joint pain, no claudication--+ fibromyalgia, jincreased leg pain bil and weakness Neuro:no syncope, no lightheadedness Endo:no diabetes, no thyroid disease Heme, hx of anemia with blood transfusions without out dx    Physical Exam/Data:   Vitals:   06/14/17 0600 06/14/17 0700 06/14/17 0816 06/14/17 0830  BP:  (!) 146/67 (!) 144/64 113/60  Pulse:  66 67 70  Resp:      Temp: 98.3 F (36.8 C) 98.3 F (36.8 C)    TempSrc: Oral Oral    SpO2:  96%    Weight:      Height:        Intake/Output Summary (Last 24 hours) at 06/14/17 0956 Last data filed at 06/14/17 0843  Gross per 24 hour  Intake                0 ml  Output             1600 ml  Net            -  1600 ml   Filed Weights   06/13/17 2215 06/14/17 0231  Weight: 178 lb (80.7 kg) 181 lb 3.2 oz (82.2 kg)   Body mass index is 34.24 kg/m.  General:  Well nourished, well developed, in no acute distress HEENT: normal Lymph: no adenopathy Neck: no JVD Endocrine:  No thryomegaly Vascular: No carotid bruits; 2 + pedal pulses bil  Cardiac:  normal S1, S2; RRR; no murmur, gallup rub or click  Lungs:  clear to auscultation bilaterally, no wheezing, rhonchi or rales  Abd: soft,  nontender, no hepatomegaly  Ext: no edema Musculoskeletal:  No deformities, BUE and BLE strength normal and equal Skin: warm and dry  Neuro:  Alert and oriented X 3 MAE follows commands, no focal abnormalities noted Psych:  Normal affect    Relevant CV Studies: Cardiac cath 09/03/14 ANGIOGRAPHIC RESULTS:   1. Left main; normal  2. LAD; 40-50% mid after a moderate size diagonal branch 3. Left circumflex; nondominant and normal.  4. Right coronary artery; dominant and normal 5. Left ventriculography; RAO left ventriculogram was performed using  25 mL of Visipaque dye at 12 mL/second. The overall LVEF estimated  60 %  Without wall motion abnormalities  IMPRESSION: Ms Appleyard has essentially normal coronary arteries with a intermediate mid LAD lesion but did not appear to be hemodynamically significant. I believe her chest pain is noncardiac and her Myoview false positive. Anterior was performed of her right common femoral artery and her puncture site was sealed with a Vascade closure device with excellent hemostasis. The patient left the laboratory stable condition. She'll be hydrated for 2 hours and discharged home. She will be seen back in our office by mid-level provider in 2-3 weeks.   Laboratory Data:  Chemistry Recent Labs Lab 06/13/17 2245 06/14/17 0125  NA 136  --   K 3.7  --   CL 102  --   CO2 27  --   GLUCOSE 104*  --   BUN 10  --   CREATININE 0.84 0.82  CALCIUM 9.0  --   GFRNONAA >60 >60  GFRAA >60 >60  ANIONGAP 7  --     No results for input(s): PROT, ALBUMIN, AST, ALT, ALKPHOS, BILITOT in the last 168 hours. Hematology Recent Labs Lab 06/13/17 2245 06/14/17 0125  WBC 7.3 7.1  RBC 4.16 3.94  HGB 13.4 12.9  HCT 40.9 38.4  MCV 98.3 97.5  MCH 32.2 32.7  MCHC 32.8 33.6  RDW 13.2 13.2  PLT 211 187   Cardiac Enzymes Recent Labs Lab 06/14/17 0125 06/14/17 0530  TROPONINI <0.03 <0.03    Recent Labs Lab 06/13/17 2251  TROPIPOC 0.00    BNPNo  results for input(s): BNP, PROBNP in the last 168 hours.  DDimer No results for input(s): DDIMER in the last 168 hours.  Radiology/Studies:  Dg Chest 2 View  Result Date: 06/13/2017 CLINICAL DATA:  Chest pain for the past 4 hours. EXAM: CHEST  2 VIEW COMPARISON:  08/12/2014. FINDINGS: Normal sized heart. Clear lungs with normal vascularity. Minimal peribronchial thickening. Stable right diaphragmatic eventration. Thoracic spine degenerative changes. IMPRESSION: Minimal bronchitic changes. Electronically Signed   By: Beckie Salts M.D.   On: 06/13/2017 23:09    Assessment and Plan:   1. Chest pain with neg MI, had mild episode this AM, relief of pain with NTG.  This could be related to Hiatal hernia but may be prudent to proceed with lexiscan myoivew.  Dr. Tenny Craw to see. 2. CAD with 50% mLAD  stenosis only 2016 with cath, chest pain at that time was not felt to be cardiac 3.  HTN controlled 4. OSA on cpapt  5. HLD stopped statins due to difficulty walking. 6. Hx bipolar disorder per IM 7. Hiatal hernia   For questions or updates, please contact [facility] Please consult [email or web address] for contact info under Cardiology/STEMI.   Signed, Nada Boozer, NP  06/14/2017 9:56 AM   Patient seen and examined  I agree with findings as noted by L INgold above  Pt is an 83 yo with history of mod CAD of LAD  Presents with CP since yesterday  Not pleuritic or positional  Somewhat atypical but Pt uncomfortable  Does note increased dyspnea with exertion. ON exam:  Lungs are CTA  Cardidac RRR no murmurs    No S3  Chest  Mild tenderness (does not mimic discomfort completely Ext No edema  Overall symptoms are not comlpletely typical but ocncerning  Needs eval  Given equivical myovue in past leading to cath I would recomm a CT angio of heart to evaluate for flow limiting lesions  Pt has known 50% LAD    Dietrich Pates

## 2017-06-15 NOTE — Interval H&P Note (Signed)
Cath Lab Visit (complete for each Cath Lab visit)  Clinical Evaluation Leading to the Procedure:   ACS: No.  Non-ACS:    Anginal Classification: CCS III  Anti-ischemic medical therapy: Minimal Therapy (1 class of medications)  Non-Invasive Test Results: No non-invasive testing performed  Prior CABG: No previous CABG      History and Physical Interval Note:  06/15/2017 2:58 PM  Robin Juarez  has presented today for surgery, with the diagnosis of cp, abnormal CT  The various methods of treatment have been discussed with the patient and family. After consideration of risks, benefits and other options for treatment, the patient has consented to  Procedure(s): LEFT HEART CATH AND CORONARY ANGIOGRAPHY (N/A) as a surgical intervention .  The patient's history has been reviewed, patient examined, no change in status, stable for surgery.  I have reviewed the patient's chart and labs.  Questions were answered to the patient's satisfaction.     Nicki Guadalajarahomas Kelly

## 2017-06-16 DIAGNOSIS — I2510 Atherosclerotic heart disease of native coronary artery without angina pectoris: Secondary | ICD-10-CM | POA: Diagnosis not present

## 2017-06-16 DIAGNOSIS — I10 Essential (primary) hypertension: Secondary | ICD-10-CM | POA: Diagnosis not present

## 2017-06-16 DIAGNOSIS — I208 Other forms of angina pectoris: Secondary | ICD-10-CM | POA: Diagnosis not present

## 2017-06-16 DIAGNOSIS — R0789 Other chest pain: Secondary | ICD-10-CM | POA: Diagnosis not present

## 2017-06-16 DIAGNOSIS — G4733 Obstructive sleep apnea (adult) (pediatric): Secondary | ICD-10-CM | POA: Diagnosis not present

## 2017-06-16 LAB — BASIC METABOLIC PANEL
ANION GAP: 9 (ref 5–15)
BUN: 7 mg/dL (ref 6–20)
CHLORIDE: 105 mmol/L (ref 101–111)
CO2: 26 mmol/L (ref 22–32)
Calcium: 9.3 mg/dL (ref 8.9–10.3)
Creatinine, Ser: 0.82 mg/dL (ref 0.44–1.00)
GFR calc Af Amer: >60 mL/min (ref >60)
GFR calc non Af Amer: >60 mL/min (ref >60)
Glucose, Bld: 122 mg/dL — ABNORMAL HIGH (ref 65–99)
POTASSIUM: 3.7 mmol/L (ref 3.5–5.1)
SODIUM: 140 mmol/L (ref 135–145)

## 2017-06-16 LAB — CBC
HCT: 41.1 % (ref 36.0–46.0)
HEMOGLOBIN: 13.5 g/dL (ref 12.0–15.0)
MCH: 32.4 pg (ref 26.0–34.0)
MCHC: 32.8 g/dL (ref 30.0–36.0)
MCV: 98.6 fL (ref 78.0–100.0)
Platelets: 187 10*3/uL (ref 150–400)
RBC: 4.17 MIL/uL (ref 3.87–5.11)
RDW: 13.5 % (ref 11.5–15.5)
WBC: 6.9 10*3/uL (ref 4.0–10.5)

## 2017-06-16 LAB — MAGNESIUM: MAGNESIUM: 2.2 mg/dL (ref 1.7–2.4)

## 2017-06-16 MED ORDER — NITROGLYCERIN 0.4 MG SL SUBL: 0.4000 mg | SUBLINGUAL_TABLET | SUBLINGUAL | 0 refills | Status: DC | PRN

## 2017-06-16 MED ORDER — ROSUVASTATIN CALCIUM 40 MG PO TABS: 40.0000 mg | ORAL_TABLET | Freq: Every day | ORAL | 0 refills | Status: DC

## 2017-06-16 MED ORDER — ROSUVASTATIN CALCIUM 5 MG PO TABS: 5.0000 mg | ORAL_TABLET | Freq: Every day | ORAL | 0 refills | Status: DC

## 2017-06-16 MED ORDER — ROSUVASTATIN CALCIUM 10 MG PO TABS: 5.0000 mg | ORAL_TABLET | Freq: Every day | ORAL | Status: DC

## 2017-06-16 MED ORDER — TRAMADOL HCL 50 MG PO TABS: 50.0000 mg | ORAL_TABLET | Freq: Four times a day (QID) | ORAL | Status: DC | PRN

## 2017-06-16 NOTE — Care Management Note (Signed)
Case Management Note  Patient Details  Name: Walden FieldMartha J Scarpino MRN: [id number] Date of Birth: 02-01-1931  Subjective/Objective:    Pt admitted with CP - s/p cardiac cath              Action/Plan:  PTA from home independent with husband.  Pt has PCP and denied barriers to obtaining and paying for medications.  There were no CM needs nor CM orders or consult determined at the time this note was written.    Expected Discharge Date:  06/15/17               Expected Discharge Plan:  Home/Self Care  In-House Referral:     Discharge planning Services  CM Consult  Post Acute Care Choice:    Choice offered to:     DME Arranged:    DME Agency:     HH Arranged:    HH Agency:     Status of Service:  Completed, signed off  If discussed at [employer] Length of Stay Meetings, dates discussed:    Additional Comments:  Cherylann ParrClaxton, Shadrick Senne S, RN 06/16/2017, 9:50 AM

## 2017-06-16 NOTE — Progress Notes (Signed)
Progress Note  Patient Name: Robin Juarez Date of Encounter: 06/16/2017  Primary Cardiologist: Dr. Nanetta Batty  Subjective   Feeling better, no chest pain, no shortness of breath, mild oozing from catheterization site noted this morning.  This has stopped.  Inpatient Medications    Scheduled Meds: . cholecalciferol  1,000 Units Oral Daily  . clopidogrel  75 mg Oral Daily  . DULoxetine  90 mg Oral Daily  . folic acid  1,000 mcg Oral Q supper  . gabapentin  300 mg Oral BID AC  . gabapentin  600 mg Oral QHS  . heparin  5,000 Units Subcutaneous Q8H  . lamoTRIgine  100 mg Oral QHS  . metoprolol succinate  25 mg Oral QHS  . OLANZapine  2.5 mg Oral QHS  . rosuvastatin  40 mg Oral Daily  . sodium chloride flush  3 mL Intravenous Q12H   Continuous Infusions: . sodium chloride Stopped (06/15/17 1857)  . sodium chloride     PRN Meds: sodium chloride, acetaminophen, diazepam, nitroGLYCERIN, ondansetron (ZOFRAN) IV, ondansetron, sodium chloride flush, traMADol   Vital Signs    Vitals:   06/15/17 2222 06/15/17 2349 06/16/17 0650 06/16/17 1121  BP: (!) 106/40 (!) 119/58 (!) 141/58 138/61  Pulse: 73 69 67 70  Resp:  18 18 18   Temp:  97.6 F (36.4 C) 98.1 F (36.7 C) 98 F (36.7 C)  TempSrc:  Oral Oral Oral  SpO2:  95% 93% 94%  Weight:   178 lb 11.2 oz (81.1 kg)   Height:        Intake/Output Summary (Last 24 hours) at 06/16/17 1142 Last data filed at 06/16/17 1120  Gross per 24 hour  Intake          1017.92 ml  Output             1800 ml  Net          -782.08 ml   Filed Weights   06/14/17 0231 06/15/17 0500 06/16/17 0650  Weight: 181 lb 3.2 oz (82.2 kg) 178 lb 1.6 oz (80.8 kg) 178 lb 11.2 oz (81.1 kg)    Telemetry    Sinus rhythm, no adverse arrhythmias- Personally Reviewed  ECG    No new EKG changes, sinus rhythm- Personally Reviewed  Physical Exam   GEN: No acute distress.   Neck: No JVD Cardiac: RRR, no murmurs, rubs, or gallops.    Respiratory: Clear to auscultation bilaterally. GI: Soft, nontender, non-distended  MS: No edema; No deformity.  New catheterization dressing in place, no evidence of significant hematoma. Neuro:  Nonfocal  Psych: Normal affect   Labs    Chemistry Recent Labs Lab 06/13/17 2245 06/14/17 0125 06/15/17 0445 06/16/17 0501  NA 136  --  141 140  K 3.7  --  3.7 3.7  CL 102  --  105 105  CO2 27  --  28 26  GLUCOSE 104*  --  97 122*  BUN 10  --  13 7  CREATININE 0.84 0.82 0.86 0.82  CALCIUM 9.0  --  9.2 9.3  GFRNONAA >60 >60 >60 >60  GFRAA >60 >60 >60 >60  ANIONGAP 7  --  8 9     Hematology Recent Labs Lab 06/14/17 0125 06/15/17 0445 06/16/17 0501  WBC 7.1 7.7 6.9  RBC 3.94 4.38 4.17  HGB 12.9 14.3 13.5  HCT 38.4 43.2 41.1  MCV 97.5 98.6 98.6  MCH 32.7 32.6 32.4  MCHC 33.6 33.1 32.8  RDW  13.2 13.4 13.5  PLT 187 212 187    Cardiac Enzymes Recent Labs Lab 06/14/17 0125 06/14/17 0530 06/14/17 1218  TROPONINI <0.03 <0.03 <0.03    Recent Labs Lab 06/13/17 2251  TROPIPOC 0.00     BNPNo results for input(s): BNP, PROBNP in the last 168 hours.   DDimer No results for input(s): DDIMER in the last 168 hours.   Radiology    Ct Coronary Morph W/cta Cor W/score W/ca W/cm &/or Wo/cm  Addendum Date: 06/14/2017   ADDENDUM REPORT: 06/14/2017 16:58 CLINICAL DATA:  83 year old female with known moderate non-obstructive CAD in LAD on teh cardiac catheterization in 2016 presenting with chest pain. EXAM: Cardiac/Coronary  CT TECHNIQUE: The patient was scanned on a [employer] scanner. FINDINGS: A 120 kV prospective scan was triggered in the descending thoracic aorta at 111 HU's. Axial non-contrast 3 mm slices were carried out through the heart. The data set was analyzed on a dedicated work station and scored using the Agatson method. Gantry rotation speed was 250 msecs and collimation was .6 mm. No beta blockade and 0.8 mg of sl NTG was given. The 3D data set was  reconstructed in 5% intervals of the 67-82 % of the R-R cycle. Diastolic phases were analyzed on a dedicated work station using MPR, MIP and VRT modes. The patient received 80 cc of contrast. Aorta:  Normal size.  No calcifications.  No dissection. Aortic Valve:  Trileaflet.  No calcifications. Coronary Arteries:  Normal coronary origin.  Right dominance. RCA is a large dominant artery that gives rise to PDA and PLVB. There minimal plaque in RCA and PDA. Left main is a large artery that gives rise to LAD and LCX arteries. There is no plaque. LAD is a large vessel that gives rise to one diagonal artery. Proximal LAD has a long eccentric calcified plaque with associated stenosis 25-50%. Mid LAD has severe complex, predominantly circumferential calcified plaque with associated stenosis > 70%. After the stenosis the LAD lumen narrows significantly. D1 is rather small and originated from calcified plaque. It appears to have severe ostial stenosis. LCX is a medium size non-dominant artery that has mild calcified plaque in the proximal and mid portion with associated stenosis 25-50%. Other findings: Normal pulmonary vein drainage into the left atrium. Normal let atrial appendage without a thrombus. Normal size of the pulmonary artery. IMPRESSION: 1. Coronary calcium score of 699. This was 7982 percentile for age and sex matched control. 2. Normal coronary origin with right dominance. 3. Moderate long plaque in the proximal LAD with stenosis 25-50%. Severe complex, predominantly circumferential calcified plaque in the mid LAD with associated stenosis > 70%. Possible severe stenosis in the ostial diagonal artery. Mild disease in non-dominant LCX artery and minimal RCA disease. Additional CT FFR analysis will be submitted because of severe calcifications and possible overestimation of stenosis severity. Tobias AlexanderKatarina Nelson Electronically Signed   By: Tobias AlexanderKatarina  Nelson   On: 06/14/2017 16:58   Result Date: 06/14/2017 EXAM:  OVER-READ INTERPRETATION  CT CHEST The following report is an over-read performed by radiologist Dr. Maryelizabeth [facility]Stewart [facility] on 06/14/2017. This over-read does not include interpretation of cardiac or coronary anatomy or pathology. The coronary CTA interpretation by the cardiologist is attached. COMPARISON:  None. FINDINGS: Limited view of the lung parenchyma demonstrates the 3 mm nodule in the RIGHT middle lobe (image 18, series 12). Airways are normal. Limited view of the mediastinum demonstrates no adenopathy. Esophagus normal. Limited view of the upper abdomen unremarkable. Limited  view of the skeleton and chest wall is unremarkable. Degenerate spurring of the spine. IMPRESSION: 1. Small RIGHT middle lobe pulmonary nodule. No follow-up needed if patient is low-risk. Non-contrast chest CT can be considered in 12 months if patient is high-risk. This recommendation follows the consensus statement: Guidelines for Management of Incidental Pulmonary Nodules Detected on CT Images: From the Fleischner Society 2017; Radiology 2017; 284:228-243. 2.  Degenerative osteophytosis of the spine. Electronically Signed: By: Genevive Bi M.D. On: 06/14/2017 15:17   Ct Coronary Fractional Flow Reserve Fluid Analysis  Result Date: 06/15/2017 EXAM: FF/RCT ANALYSIS FINDINGS: FFRct analysis was performed on the original cardiac CT angiogram dataset. Diagrammatic representation of the FFRct analysis is provided in a separate PDF document in PACS. This dictation was created using the PDF document and an interactive 3D model of the results. 3D model is not available in the EMR/PACS. Normal FFR range is >0.80. 1. Left Main:  No significant stenosis. 2. LAD: No significant stenosis. 3. LCX:  Proximal CT FFR:  Normal, mid CT FFR 0.75 and less. 4. RCA: No significant stenosis. IMPRESSION: 1. CT FFR analysis showed a significant stenosis in the mid to distal LAD. A cardiac catheterization is recommended.  Electronically Signed   By: Tobias Alexander   On: 06/15/2017 12:23    Cardiac Studies   Coronary CT scan FFR appeared abnormal however cardiac catheterization as below showed nonobstructive disease.  Cardiac catheterization 06/15/17: Dr. Collier Salina LAD lesion, 20 %stenosed.  Prox LAD lesion, 40 %stenosed.  Ost 1st Diag to 1st Diag lesion, 50 %stenosed.  Ost LAD to Prox LAD lesion, 30 %stenosed.  Prox Cx to Mid Cx lesion, 20 %stenosed.  LV end diastolic pressure is normal.   Coronary calcification with 20-30% proximal LAD stenosis before the takeoff of the first diagonal vessel, 50% ostial stenosis in a small diagonal -1 vessel with 40% LAD stenosis beyond the diagonal; mild 20% proximal LCX stenosis, and normal RCA.   LVEDP 14 - 15 mm Hg.  RECOMMENDATION: Increased medical therapy with addition of nitrates to current therapy and aggressive lipid lowering.    Patient Profile     83 y.o. female with moderate coronary artery disease, nonobstructive with episode of chest pain relieved with nitroglycerin.  Assessment & Plan    Coronary artery disease/chest pain -Cardiac catheterization demonstrated nonobstructive, moderate CAD.  Continue with aggressive medical therapy. -Trying to start low-dose Crestor 5 mg.  In the past statins were stopped because of difficulty walking, she was on pravastatin.  She states at baseline she has pains in her legs, difficulty walking. -Cardiac catheterization findings are consistent with prior catheterization in 2016. -Continue with home clopidogrel and metoprolol.  If symptoms continue, we can consider adding isosorbide 30 mg.  Hyperlipidemia with statin intolerance - Try to initiate low-dose Crestor 2.5 mg a day.  Essential hypertension -Currently well controlled.  Obstructive sleep apnea -CPAP  History of bipolar disorder -Stable  Hiatal hernia -May be contributing to some of her discomfort.  Okay for discharge.  She is  concerned because her husband has a bad cold she states.  Continue to wash hands.  Hold pressure if any bleeding occurs at cath site and groin.  For questions or updates, please contact [facility] Please consult [email or web address] for contact info under Cardiology/STEMI.      Signed, Donato Schultz, MD  06/16/2017, 11:42 AM

## 2017-06-16 NOTE — Progress Notes (Signed)
Pt discharged home Iv and tele removed.

## 2017-06-16 NOTE — Progress Notes (Signed)
Bath offered and pt stated that she would like to wait to see if she will be d/c.

## 2017-06-16 NOTE — Care Management Obs Status (Signed)
MEDICARE OBSERVATION STATUS NOTIFICATION   Patient Details  Name: Robin Juarez MRN: [id number] Date of Birth: 10/24/1930   Medicare Observation Status Notification Given:  Yes    Cherylann ParrClaxton, Jari Dipasquale S, RN 06/16/2017, 9:54 AM

## 2017-06-16 NOTE — Discharge Summary (Addendum)
Physician Discharge Summary  [name] BJY:[id number] DOB: August 03, 1931 DOA: 06/13/2017  PCP: Joaquim Namuncan, Graham S, MD  Admit date: 06/13/2017 Discharge date: 06/16/2017  Admitted From: Home Disposition:  Home  Recommendations for Outpatient Follow-up:  1. Follow up with PCP in 1week 2. Follow-up with cardiology in 1-2 weeks 3. Follow-up in the ED if symptoms worsen or new appear  Home Health: No  Equipment/Devices: None  Discharge Condition: Stable  CODE STATUS: Full  Diet recommendation: Heart Healthy   Brief/Interim Summary: 83 year old female with history of hypertension, hyperlipidemia, bipolar disorder, sleep apnea presented with chest pain. Cardiology evaluated the patient and she underwent cardiac catheterization on 06/15/2017 and cardiology recommended medical management. Patient is chest pain-free. If okay with cardiology, patient will be discharged home today   Discharge Diagnoses:  Principal Problem:   Chest pain Active Problems:   OSA (obstructive sleep apnea)  1. Chest pain -concerning for angina. Status post cardiac catheterization on 06/15/2017 and cardiology recommended medical management. Troponins negative. Currently chest pain-free. Echo showed EF of 60-65% with grade 1 diastolic dysfunction. Continue Plavix, metoprolol. Patient has been started on rosuvastatin by cardiology which will be continued. If it is an outpatient, blood pressure allows, patient can be put on nitrates as well. 2. Hypertension: Continue metoprolol. Outpatient follow-up  3. Hyperlipidemia - Crestor has been started by cardiology which will be continued. 4. History of sleep apnea. 5. History of bipolar disorder -continue Lamictal, Zyprexa and Cymbalta. Outpatient follow-up with psychiatry   Discharge Instructions  Discharge Instructions    Ambulatory referral to Cardiology    Complete by:  As directed    Follow up in 1-2 weeks; recent cath   Call MD for:  difficulty breathing,  headache or visual disturbances    Complete by:  As directed    Call MD for:  extreme fatigue    Complete by:  As directed    Call MD for:  hives    Complete by:  As directed    Call MD for:  persistant dizziness or light-headedness    Complete by:  As directed    Call MD for:  persistant nausea and vomiting    Complete by:  As directed    Call MD for:  severe uncontrolled pain    Complete by:  As directed    Call MD for:  temperature >100.4    Complete by:  As directed    Diet - low sodium heart healthy    Complete by:  As directed    Increase activity slowly    Complete by:  As directed      Allergies as of 06/16/2017      Reactions   Digoxin Other (See Comments)   Felt weak, tired, "aweful"   Nitrofurantoin Other (See Comments)   Sharp pain back of head   Alendronate Sodium Other (See Comments)   cramps   Simvastatin Other (See Comments)   Myalgia, possible intolerance   Sulfonamide Derivatives Other (See Comments)   REACTION: (Maybe)-neck pain-brief   Pravastatin Other (See Comments)   Myalgias   Augmentin [amoxicillin-pot Clavulanate] Rash   In mouth   Cefaclor Other (See Comments)   Felt bad   Cephalexin Other (See Comments)   Felt bad, but able to tolerate amoxil prev   Chlordiazepoxide Other (See Comments)   REACTION: (Maybe)   Clavulanic Acid Other (See Comments)   Not an allergy.  Tolerates plain amoxil but not augmentin- diarrhea, etc with augmentin   Omeprazole  Rash   rash   Oxcarbazepine Other (See Comments)   unknown   Pb-hyoscy-atropine-scopolamine Other (See Comments)   Unsure-doesn't remember   Streptomycin Other (See Comments)   Lips itched   Tetracycline Itching   itching   Zetia [ezetimibe] Other (See Comments)   possible cause of aches and abnormal dreams, stopped 05/2014      Medication List    TAKE these medications   cholecalciferol 1000 units tablet Commonly known as:  VITAMIN D Take 1,000 Units by mouth daily.   clopidogrel 75  MG tablet Commonly known as:  PLAVIX TAKE 1 TABLET (75 MG TOTAL) BY MOUTH DAILY.   CO Q 10 PO Take 1 tablet by mouth daily.   DULoxetine 30 MG capsule Commonly known as:  CYMBALTA Take 90 mg by mouth daily.   folic acid 800 MCG tablet Commonly known as:  FOLVITE Take 800 mcg by mouth daily.   gabapentin 300 MG capsule Commonly known as:  NEURONTIN Take 300-600 mg by mouth 3 (three) times daily. 300 mg twice daily and 600 mg in the evening   hydrocortisone 2.5 % ointment Apply 1 application topically daily as needed (rash).   KRILL OIL PO Take 100 mg by mouth daily.   lamoTRIgine 100 MG tablet Commonly known as:  LAMICTAL Take 100 mg by mouth at bedtime.   metoprolol succinate 25 MG 24 hr tablet Commonly known as:  TOPROL-XL TAKE 1 TABLET (25 MG TOTAL) BY MOUTH DAILY.   nitroGLYCERIN 0.4 MG SL tablet Commonly known as:  NITROSTAT Place 1 tablet (0.4 mg total) under the tongue every 5 (five) minutes as needed for chest pain.   OLANZapine 2.5 MG tablet Commonly known as:  ZYPREXA Take 1 tablet (2.5 mg total) by mouth at bedtime.   ondansetron 8 MG disintegrating tablet Commonly known as:  ZOFRAN ODT Take 1 tablet (8 mg total) by mouth every 8 (eight) hours as needed for nausea or vomiting.   rosuvastatin 5 MG tablet Commonly known as:  CRESTOR Take 1 tablet (5 mg total) by mouth daily.   traMADol 50 MG tablet Commonly known as:  ULTRAM Take 1 tablet (50 mg total) by mouth every 6 (six) hours as needed for moderate pain.       Follow-up Information    Joaquim Nam, MD. Schedule an appointment as soon as possible for a visit in 1 week(s).   Specialty:  Family Medicine Contact information: [address] [place] [address] [phone]        Runell Gess, MD. Schedule an appointment as soon as possible for a visit.   Specialties:  Cardiology, Radiology Why:  in 1-2 weeks Contact information: [address] [place]  [place] [address] [phone]          Allergies  Allergen Reactions  . Digoxin Other (See Comments)    Felt weak, tired, "aweful"  . Nitrofurantoin Other (See Comments)    Sharp pain back of head  . Alendronate Sodium Other (See Comments)    cramps  . Simvastatin Other (See Comments)    Myalgia, possible intolerance  . Sulfonamide Derivatives Other (See Comments)    REACTION: (Maybe)-neck pain-brief  . Pravastatin Other (See Comments)    Myalgias  . Augmentin [Amoxicillin-Pot Clavulanate] Rash    In mouth  . Cefaclor Other (See Comments)    Felt bad  . Cephalexin Other (See Comments)    Felt bad, but able to tolerate amoxil prev  . Chlordiazepoxide  Other (See Comments)    REACTION: (Maybe)  . Clavulanic Acid Other (See Comments)    Not an allergy.  Tolerates plain amoxil but not augmentin- diarrhea, etc with augmentin  . Omeprazole Rash    rash  . Oxcarbazepine Other (See Comments)    unknown  . Pb-Hyoscy-Atropine-Scopolamine Other (See Comments)    Unsure-doesn't remember  . Streptomycin Other (See Comments)    Lips itched  . Tetracycline Itching    itching  . Zetia [Ezetimibe] Other (See Comments)    possible cause of aches and abnormal dreams, stopped 05/2014    Consultations:  Cardiology   Procedures/Studies: Dg Chest 2 View  Result Date: 06/13/2017 CLINICAL DATA:  Chest pain for the past 4 hours. EXAM: CHEST  2 VIEW COMPARISON:  08/12/2014. FINDINGS: Normal sized heart. Clear lungs with normal vascularity. Minimal peribronchial thickening. Stable right diaphragmatic eventration. Thoracic spine degenerative changes. IMPRESSION: Minimal bronchitic changes. Electronically Signed   By: Beckie Salts M.D.   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 EXAM: Cardiac/Coronary  CT TECHNIQUE: The patient was scanned on a [employer]. FINDINGS: A 120 kV prospective scan was triggered in the descending thoracic aorta at 111 HU's. Axial non-contrast 3 mm slices were carried out through the heart. The data set was analyzed on a dedicated work station and scored using the Agatson method. Gantry rotation speed was 250 msecs and collimation was .6 mm. No beta blockade and 0.8 mg of sl NTG was given. The 3D data set was reconstructed in 5% intervals of the 67-82 % of the R-R cycle. Diastolic phases were analyzed on a dedicated work station using MPR, MIP and VRT modes. The patient received 80 cc of contrast. Aorta:  Normal size.  No calcifications.  No dissection. Aortic Valve:  Trileaflet.  No calcifications. Coronary Arteries:  Normal coronary origin.  Right dominance. RCA is a large dominant artery that gives rise to PDA and PLVB. There minimal plaque in RCA and PDA. Left main is a large artery that gives rise to LAD and LCX arteries. There is no plaque. LAD is a large vessel that gives rise to one diagonal artery. Proximal LAD has a long eccentric calcified plaque with associated stenosis 25-50%. Mid LAD has severe complex, predominantly circumferential calcified plaque with associated stenosis > 70%. After the stenosis the LAD lumen narrows significantly. D1 is rather small and originated from calcified plaque. It appears to have severe ostial stenosis. LCX is a medium size non-dominant artery that has mild calcified plaque in the proximal and mid portion with associated stenosis 25-50%. Other findings: Normal pulmonary vein drainage into the left atrium. Normal let atrial appendage without a thrombus. Normal size of the pulmonary artery. IMPRESSION: 1. Coronary calcium score of 699. This was 8 percentile for age and sex matched control. 2. Normal coronary origin with right dominance. 3. Moderate long plaque in the proximal  LAD with stenosis 25-50%. Severe complex, predominantly circumferential calcified plaque in the mid LAD with associated stenosis > 70%. Possible severe stenosis in the ostial diagonal artery. Mild disease in non-dominant LCX artery and minimal RCA disease. Additional CT FFR analysis will be submitted because of severe calcifications and possible overestimation of stenosis severity. Tobias Alexander Electronically Signed   By: Aris Lot  Nelson   On: 06/14/2017 16:58   Result Date: 06/14/2017 EXAM: OVER-READ INTERPRETATION  CT CHEST The following report is an over-read performed by radiologist Dr. Maryelizabeth Rowan [facility] on 06/14/2017. This over-read does not include interpretation of cardiac or coronary anatomy or pathology. The coronary CTA interpretation by the cardiologist is attached. COMPARISON:  None. FINDINGS: Limited view of the lung parenchyma demonstrates the 3 mm nodule in the RIGHT middle lobe (image 18, series 12). Airways are normal. Limited view of the mediastinum demonstrates no adenopathy. Esophagus normal. Limited view of the upper abdomen unremarkable. Limited view of the skeleton and chest wall is unremarkable. Degenerate spurring of the spine. IMPRESSION: 1. Small RIGHT middle lobe pulmonary nodule. No follow-up needed if patient is low-risk. Non-contrast chest CT can be considered in 12 months if patient is high-risk. This recommendation follows the consensus statement: Guidelines for Management of Incidental Pulmonary Nodules Detected on CT Images: From the Fleischner Society 2017; Radiology 2017; 284:228-243. 2.  Degenerative osteophytosis of the spine. Electronically Signed: By: Genevive Bi M.D. On: 06/14/2017 15:17   Ct Coronary Fractional Flow Reserve Fluid Analysis  Result Date: 06/15/2017 EXAM: FF/RCT ANALYSIS FINDINGS: FFRct analysis was performed on the original cardiac CT angiogram dataset. Diagrammatic representation of the FFRct analysis is provided in  a separate PDF document in PACS. This dictation was created using the PDF document and an interactive 3D model of the results. 3D model is not available in the EMR/PACS. Normal FFR range is >0.80. 1. Left Main:  No significant stenosis. 2. LAD: No significant stenosis. 3. LCX:  Proximal CT FFR:  Normal, mid CT FFR 0.75 and less. 4. RCA: No significant stenosis. IMPRESSION: 1. CT FFR analysis showed a significant stenosis in the mid to distal LAD. A cardiac catheterization is recommended. Electronically Signed   By: Tobias Alexander   On: 06/15/2017 12:23    Echo on 06/14/2017 ------------------------------------------------------------------- Study Conclusions  - Left ventricle: The cavity size was normal. Wall thickness was normal. Systolic function was normal. The estimated ejection fraction was in the range of 60% to 65%. Wall motion was normal; there were no regional wall motion abnormalities. Doppler parameters are consistent with abnormal left ventricular relaxation (grade 1 diastolic dysfunction). - Mitral valve: Calcified annulus. - Atrial septum: There was increased thickness of the septum, consistent with lipomatous hypertrophy.  Impressions:  - Normal LV systolic function; mild diastolic dysfunction; trace MR and TR.  Cardiac catheterization on 06/15/2017  Ost LAD lesion, 20 %stenosed.  Prox LAD lesion, 40 %stenosed.  Ost 1st Diag to 1st Diag lesion, 50 %stenosed.  Ost LAD to Prox LAD lesion, 30 %stenosed.  Prox Cx to Mid Cx lesion, 20 %stenosed.  LV end diastolic pressure is normal.   Subjective: Patient seen and examined at bedside. She denies any current chest pain, nausea or vomiting. She wants to go home  Discharge Exam: Vitals:   06/15/17 2349 06/16/17 0650  BP: (!) 119/58 (!) 141/58  Pulse: 69 67  Resp: 18 18  Temp: 97.6 F (36.4 C) 98.1 F (36.7 C)  SpO2: 95% 93%   Vitals:   06/15/17 2003 06/15/17 2222 06/15/17 2349 06/16/17 0650   BP: (!) 112/50 (!) 106/40 (!) 119/58 (!) 141/58  Pulse: 73 73 69 67  Resp: 18  18 18   Temp: 98.1 F (36.7 C)  97.6 F (36.4 C) 98.1 F (36.7 C)  TempSrc: Oral  Oral Oral  SpO2: 95%  95% 93%  Weight:    81.1  kg (178 lb 11.2 oz)  Height:        General: Pt is alert, awake, not in acute distress Cardiovascular: Rate controlled, S1/S2 +, no rubs, no gallops Respiratory: Bilateral decreased breath sounds at bases Abdominal: Soft, NT, ND, bowel sounds + Extremities: no edema, no cyanosis    The results of significant diagnostics from this hospitalization (including imaging, microbiology, ancillary and laboratory) are listed below for reference.     Microbiology: No results found for this or any previous visit (from the past 240 hour(s)).   Labs: BNP (last 3 results) No results for input(s): BNP in the last 8760 hours. Basic Metabolic Panel:  Recent Labs Lab 06/13/17 2245 06/14/17 0125 06/15/17 0445 06/16/17 0501  NA 136  --  141 140  K 3.7  --  3.7 3.7  CL 102  --  105 105  CO2 27  --  28 26  GLUCOSE 104*  --  97 122*  BUN 10  --  13 7  CREATININE 0.84 0.82 0.86 0.82  CALCIUM 9.0  --  9.2 9.3  MG  --   --  2.2 2.2   Liver Function Tests: No results for input(s): AST, ALT, ALKPHOS, BILITOT, PROT, ALBUMIN in the last 168 hours. No results for input(s): LIPASE, AMYLASE in the last 168 hours. No results for input(s): AMMONIA in the last 168 hours. CBC:  Recent Labs Lab 06/13/17 2245 06/14/17 0125 06/15/17 0445 06/16/17 0501  WBC 7.3 7.1 7.7 6.9  NEUTROABS  --   --  4.7  --   HGB 13.4 12.9 14.3 13.5  HCT 40.9 38.4 43.2 41.1  MCV 98.3 97.5 98.6 98.6  PLT 211 187 212 187   Cardiac Enzymes:  Recent Labs Lab 06/14/17 0125 06/14/17 0530 06/14/17 1218  TROPONINI <0.03 <0.03 <0.03   BNP: Invalid input(s): POCBNP CBG: No results for input(s): GLUCAP in the last 168 hours. D-Dimer No results for input(s): DDIMER in the last 72 hours. Hgb A1c No results  for input(s): HGBA1C in the last 72 hours. Lipid Profile No results for input(s): CHOL, HDL, LDLCALC, TRIG, CHOLHDL, LDLDIRECT in the last 72 hours. Thyroid function studies No results for input(s): TSH, T4TOTAL, T3FREE, THYROIDAB in the last 72 hours.  Invalid input(s): FREET3 Anemia work up No results for input(s): VITAMINB12, FOLATE, FERRITIN, TIBC, IRON, RETICCTPCT in the last 72 hours. Urinalysis    Component Value Date/Time   COLORURINE STRAW (A) 01/22/2017 1200   APPEARANCEUR CLEAR 01/22/2017 1200   LABSPEC 1.005 01/22/2017 1200   PHURINE 6.0 01/22/2017 1200   GLUCOSEU NEGATIVE 01/22/2017 1200   HGBUR NEGATIVE 01/22/2017 1200   BILIRUBINUR negative 05/15/2017 1220   KETONESUR NEGATIVE 01/22/2017 1200   PROTEINUR negative 05/15/2017 1220   PROTEINUR NEGATIVE 01/22/2017 1200   UROBILINOGEN 0.2 05/15/2017 1220   UROBILINOGEN 0.2 08/12/2014 2227   NITRITE negative 05/15/2017 1220   NITRITE NEGATIVE 01/22/2017 1200   LEUKOCYTESUR Large (3+) (A) 05/15/2017 1220   Sepsis Labs Invalid input(s): PROCALCITONIN,  WBC,  LACTICIDVEN Microbiology No results found for this or any previous visit (from the past 240 hour(s)).   Time coordinating discharge:  30 minutes  SIGNED:   Glade Lloyd, MD  [facility] 06/16/2017, 9:35 AM Pager: [phone]  If 7PM-7AM, please contact night-coverage [email or web address] Password [email or web address]

## 2017-06-18 ENCOUNTER — Encounter (HOSPITAL_COMMUNITY): Payer: Self-pay | Admitting: Cardiovascular Disease

## 2017-06-18 ENCOUNTER — Telehealth: Payer: Self-pay | Admitting: *Deleted

## 2017-06-18 MED FILL — Lidocaine HCl Local Inj 2%: INTRAMUSCULAR | Qty: 20 | Status: AC

## 2017-06-18 NOTE — Telephone Encounter (Signed)
Lm requesting return call to complete TCM and confirm hosp f/u appt. OK for RN to complete TCM or schedule hospital f/u within 2 business days

## 2017-06-19 ENCOUNTER — Encounter: Payer: Self-pay | Admitting: Cardiovascular Disease

## 2017-06-19 ENCOUNTER — Ambulatory Visit (INDEPENDENT_AMBULATORY_CARE_PROVIDER_SITE_OTHER): Payer: Medicare Other | Admitting: Cardiovascular Disease

## 2017-06-19 ENCOUNTER — Ambulatory Visit: Payer: Medicare Other

## 2017-06-19 VITALS — BP 126/84 | HR 77 | Ht 61.0 in | Wt 180.4 lb

## 2017-06-19 DIAGNOSIS — I2089 Other forms of angina pectoris: Secondary | ICD-10-CM

## 2017-06-19 DIAGNOSIS — I10 Essential (primary) hypertension: Secondary | ICD-10-CM | POA: Diagnosis not present

## 2017-06-19 DIAGNOSIS — I208 Other forms of angina pectoris: Secondary | ICD-10-CM | POA: Diagnosis not present

## 2017-06-19 DIAGNOSIS — I739 Peripheral vascular disease, unspecified: Secondary | ICD-10-CM | POA: Diagnosis not present

## 2017-06-19 NOTE — Assessment & Plan Note (Signed)
History of hyperlipidemia previously on pravastatin and recently changed to rosuvastatin 5 mg a day. She does complain of lower extremity weakness although she has palpable pedal pulses. I'm going to get Lotrimin or chill Doppler studies and if these are normal. Her statin drug (statin holiday 4-6 weeks) and if her symptoms improve she may be a candidate for PCSK9 monoclonal

## 2017-06-19 NOTE — Progress Notes (Signed)
06/19/2017 Robin KayserMartha J Warbington   [birth year]  [id number]  Primary Physician Joaquim Namuncan, Graham S, MD Primary Cardiologist: Runell GessJonathan J Lizandra Zakrzewski MD Nicholes CalamityFACP, FACC, FAHA, [place]  HPI:  Walden FieldMartha J Martinec is a 83 y.o. female moderately overweight married Caucasian female mother of 4 children, grandmother of 7911 grandchildren who self-referred for evaluation of chest pain. I last saw her in the office 04/04/16.Her primary care physician is Dr. Crawford GivensGraham Duncan. She took up in by one of her daughters [name] who I know. Her cardiac risk factors are notable for family history with brother does have coronary artery bypass grafting. She probably does have hyperlipidemia and hypertension which is not treated. She began having chest pain 2 weeks ago which is left precordial and radiates to her back. There are no other associated symptoms. She also initially had strokelike symptoms and had an MRI that showed a subacute stroke in the corona radiata. She had a Myoview stress test that was low risk but that show subtle anteroapical ischemia. I performed cardiac catheterization on her 09/03/14 through the right femoral approach revealing at most 50% mid LAD stenosis after a moderate sized diagonal branch with otherwise normal coronary arteries and normal LV function. I thought her chest pain was noncardiac. Since I saw her in the office a year ago she's remained clinically stable until recently when [facility] was [facility] for chest pain. She ruled out for myocardial infarction. Her echo was normal with grade 1 diastolic dysfunction. A CT FFR suggested disease in the IV territory which led to a cardiac catheterization by Dr. Tresa EndoKelly the same day revealing noncritical CAD.   Current Meds  Medication Sig  . cholecalciferol (VITAMIN D) 1000 UNITS tablet Take 1,000 Units by mouth daily.    . clopidogrel (PLAVIX) 75 MG tablet TAKE 1 TABLET (75 MG TOTAL) BY MOUTH DAILY.  [name]. Coenzyme Q10 (CO Q 10 PO) Take  1 tablet by mouth daily.  . DULoxetine (CYMBALTA) 30 MG capsule Take 90 mg by mouth daily.   . folic acid (FOLVITE) 800 MCG tablet Take 800 mcg by mouth daily.   [name]. gabapentin (NEURONTIN) 300 MG capsule Take 300-600 mg by mouth 3 (three) times daily. 300 mg twice daily and 600 mg in the evening  . hydrocortisone 2.5 % ointment Apply 1 application topically daily as needed (rash).   [name]. KRILL OIL PO Take 100 mg by mouth daily.  [name]. lamoTRIgine (LAMICTAL) 100 MG tablet Take 100 mg by mouth at bedtime.  . metoprolol succinate (TOPROL-XL) 25 MG 24 hr tablet TAKE 1 TABLET (25 MG TOTAL) BY MOUTH DAILY.  . nitroGLYCERIN (NITROSTAT) 0.4 MG SL tablet Place 1 tablet (0.4 mg total) under the tongue every 5 (five) minutes as needed for chest pain.  [name]. OLANZapine (ZYPREXA) 2.5 MG tablet Take 1 tablet (2.5 mg total) by mouth at bedtime.  . ondansetron (ZOFRAN ODT) 8 MG disintegrating tablet Take 1 tablet (8 mg total) by mouth every 8 (eight) hours as needed for nausea or vomiting.  . rosuvastatin (CRESTOR) 5 MG tablet Take 1 tablet (5 mg total) by mouth daily.  . traMADol (ULTRAM) 50 MG tablet Take 1 tablet (50 mg total) by mouth every 6 (six) hours as needed for moderate pain.     Allergies  Allergen Reactions  . Digoxin Other (See Comments)    Felt weak, tired, "aweful"  . Nitrofurantoin Other (See Comments)    Sharp pain back of head  . Alendronate  Sodium Other (See Comments)    cramps  . Simvastatin Other (See Comments)    Myalgia, possible intolerance  . Sulfasalazine Other (See Comments)    REACTION: (Maybe)-neck pain-brief  . Sulfonamide Derivatives Other (See Comments)    REACTION: (Maybe)-neck pain-brief  . Alendronate Other (See Comments)  . Alendronate Sodium   . Chlordiazepoxide Hcl Other (See Comments)  . Macrobid  [Nitrofurantoin Macrocrystal]   . Pb-Hyoscy-Atropine-Scopol Er Other (See Comments)  . Pravastatin Other (See Comments)    Myalgias  . Augmentin [Amoxicillin-Pot Clavulanate]  Rash    In mouth  . Cefaclor Other (See Comments)    Felt bad  . Cephalexin Other (See Comments)    Felt bad, but able to tolerate amoxil prev  . Chlordiazepoxide Other (See Comments)    REACTION: (Maybe)  . Clavulanic Acid Other (See Comments)    Not an allergy.  Tolerates plain amoxil but not augmentin- diarrhea, etc with augmentin  . Omeprazole Rash    rash  . Oxcarbazepine Other (See Comments)    unknown  . Pb-Hyoscy-Atropine-Scopolamine Other (See Comments)    Unsure-doesn't remember  . Streptomycin Other (See Comments)    Lips itched  . Sulfa Antibiotics Other (See Comments)    Unsure-doesn't remember  . Tetracycline Itching    itching  . Zetia [Ezetimibe] Other (See Comments)    possible cause of aches and abnormal dreams, stopped 05/2014    Social History   Social History  . Marital status: Married    Spouse name: N/A  . Number of children: 4  . Years of education: N/A   Occupational History  . RETIRED Retired   Social History Main Topics  . Smoking status: Never Smoker  . Smokeless tobacco: Never Used     Comment: a small amount when 83 yo  . Alcohol use No  . Drug use: No  . Sexual activity: Not Currently   Other Topics Concern  . Not on file   Social History Narrative   Married 1953, husband is well.   5 pregnancies, 4 live births, all well (5th was unexpect pregnancy and patient needed to terminate due to concurrent medical problems at the time)   11 grandchildren.  3 great grandchildren   Regular exercise:  No     Review of Systems: General: negative for chills, fever, night sweats or weight changes.  Cardiovascular: negative for chest pain, dyspnea on exertion, edema, orthopnea, palpitations, paroxysmal nocturnal dyspnea or shortness of breath Dermatological: negative for rash Respiratory: negative for cough or wheezing Urologic: negative for hematuria Abdominal: negative for nausea, vomiting, diarrhea, bright red blood per rectum, melena, or  hematemesis Neurologic: negative for visual changes, syncope, or dizziness All other systems reviewed and are otherwise negative except as noted above.    Blood pressure (!) 166/85, pulse 77, height 5\' 1"  (1.549 m), weight 180 lb 6.4 oz (81.8 kg), SpO2 93 %.  General appearance: alert and no distress Neck: no adenopathy, no carotid bruit, no JVD, supple, symmetrical, trachea midline and thyroid not enlarged, symmetric, no tenderness/mass/nodules Lungs: clear to auscultation bilaterally Heart: regular rate and rhythm, S1, S2 normal, no murmur, click, rub or gallop Extremities: extremities normal, atraumatic, no cyanosis or edema Pulses: 2+ and symmetric Skin: Skin color, texture, turgor normal. No rashes or lesions Neurologic: Alert and oriented X 3, normal strength and tone. Normal symmetric reflexes. Normal coordination and gait  EKG not performed today  ASSESSMENT AND PLAN:   HLD (hyperlipidemia) History of hyperlipidemia previously on pravastatin  and recently changed to rosuvastatin 5 mg a day. She does complain of lower extremity weakness although she has palpable pedal pulses. I'm going to get Lotrimin or chill Doppler studies and if these are normal. Her statin drug (statin holiday 4-6 weeks) and if her symptoms improve she may be a candidate for PCSK9 monoclonal  Essential hypertension History of essential hypertension blood pressure measured today at 166/85. She is on Toprol. Continue current meds at current dosing.  Chest pain Ms. White cell was recently admitted to [facility] from 06/13/17 for 3 days because of atypical chest pain. She ruled out for myocardial infarction. She had a CT FFR that suggested physiologically significant disease in her LAD and underwent cardiac catheterization by Dr. Daphene Jaeger on 06/15/17 revealing noncritical CAD and normal LV function. I reassured her that her chest pain was most likely noncardiac. She did have a cardiac catheterization  performed by myself 09/03/14 revealing similar findings.      Runell Gess MD FACP,FACC,FAHA, [facility] 06/19/2017 2:42 PM

## 2017-06-19 NOTE — Assessment & Plan Note (Signed)
History of essential hypertension blood pressure measured today at 166/85. She is on Toprol. Continue current meds at current dosing.

## 2017-06-19 NOTE — Assessment & Plan Note (Signed)
[name] cell was recently admitted [facility] 06/13/[facility] for 3 days because of atypical chest pain. She ruled out for myocardial infarction. She had a CT FFR that suggested physiologically significant disease in her LAD and underwent cardiac catheterization by Dr. Daphene Jaegerom Kelly on 06/15/17 revealing noncritical CAD and normal LV function. I reassured her that her chest pain was most likely noncardiac. She did have a cardiac catheterization performed by myself 09/03/14 revealing similar findings.

## 2017-06-19 NOTE — Patient Instructions (Signed)
Medication Instructions: Your physician recommends that you continue on your current medications as directed. Please refer to the Current Medication list given to you today.  Testing/Procedures: Your physician has requested that you have a lower extremity arterial duplex. During this test, ultrasound is used to evaluate arterial blood flow in the legs. Allow one hour for this exam. There are no restrictions or special instructions.  Your physician has requested that you have an ankle brachial index (ABI). During this test an ultrasound and blood pressure cuff are used to evaluate the arteries that supply the arms and legs with blood. Allow thirty minutes for this exam. There are no restrictions or special instructions.  (We will call you with your results and let you know any further instructions)  Follow-Up: We request that you follow-up in: 6 months with an extender and in 12 months with Dr San MorelleBerry  You will receive a reminder letter in the mail two months in advance. If you don't receive a letter, please call our office to schedule the follow-up appointment.  If you need a refill on your cardiac medications before your next appointment, please call your pharmacy.

## 2017-06-20 NOTE — Research (Signed)
OPTIMIZE Informed Consent   Subject Name: TAELER WINNING  Subject met inclusion and exclusion criteria.  The informed consent form, study requirements and expectations were reviewed with the subject and questions and concerns were addressed prior to the signing of the consent form.  The subject verbalized understanding of the trail requirements.  The subject agreed to participate in the OPTIMIZE trial and signed the informed consent.  The informed consent was obtained prior to performance of any protocol-specific procedures for the subject.  A copy of the signed informed consent was given to the subject and a copy was placed in the subject's medical record.  Philemon Kingdom D 06/15/2017, 1230 PM

## 2017-06-21 ENCOUNTER — Telehealth: Payer: Self-pay | Admitting: Cardiovascular Disease

## 2017-06-21 DIAGNOSIS — E7849 Other hyperlipidemia: Secondary | ICD-10-CM

## 2017-06-21 NOTE — Telephone Encounter (Signed)
Spoke with pt she states that at her appt with Dr Allyson SabalBerry he told her that she was a good candidate for the pcsk9. She would like to talk about this. She states that she wants to call her insurance

## 2017-06-21 NOTE — Telephone Encounter (Signed)
Spoke with patient - is interested in PCSK-9i.  Will get lipid labs drawn in next week and appt set for Nov 20.

## 2017-06-21 NOTE — Telephone Encounter (Signed)
New message   Pt verbalized that she is calling to speak to the rn about getting injections

## 2017-06-25 ENCOUNTER — Encounter: Payer: Medicare Other | Admitting: Family Medicine

## 2017-06-26 ENCOUNTER — Other Ambulatory Visit: Payer: Self-pay | Admitting: *Deleted

## 2017-06-26 DIAGNOSIS — Z79899 Other long term (current) drug therapy: Secondary | ICD-10-CM

## 2017-06-26 DIAGNOSIS — E7849 Other hyperlipidemia: Secondary | ICD-10-CM

## 2017-06-26 LAB — HEPATIC FUNCTION PANEL
ALK PHOS: 85 IU/L (ref 39–117)
ALT: 13 IU/L (ref 0–32)
AST: 17 IU/L (ref 0–40)
Albumin: 4.4 g/dL (ref 3.5–4.7)
BILIRUBIN TOTAL: 0.5 mg/dL (ref 0.0–1.2)
BILIRUBIN, DIRECT: 0.15 mg/dL (ref 0.00–0.40)
Total Protein: 6.6 g/dL (ref 6.0–8.5)

## 2017-06-26 LAB — LIPID PANEL
CHOLESTEROL TOTAL: 191 mg/dL (ref 100–199)
Chol/HDL Ratio: 3 ratio (ref 0.0–4.4)
HDL: 63 mg/dL (ref >39)
LDL Calculated: 108 mg/dL — ABNORMAL HIGH (ref 0–99)
Triglycerides: 100 mg/dL (ref 0–149)
VLDL CHOLESTEROL CAL: 20 mg/dL (ref 5–40)

## 2017-07-10 ENCOUNTER — Ambulatory Visit: Payer: Medicare Other | Admitting: Cardiovascular Disease

## 2017-07-10 ENCOUNTER — Ambulatory Visit (INDEPENDENT_AMBULATORY_CARE_PROVIDER_SITE_OTHER): Payer: Medicare Other | Admitting: Pharmacist Clinician (PhC)/ Clinical Pharmacy Specialist

## 2017-07-10 DIAGNOSIS — E785 Hyperlipidemia, unspecified: Secondary | ICD-10-CM | POA: Diagnosis not present

## 2017-07-10 MED ORDER — EZETIMIBE 10 MG PO TABS: 10.0000 mg | ORAL_TABLET | Freq: Every day | ORAL | 3 refills | Status: DC

## 2017-07-10 MED ORDER — ROSUVASTATIN CALCIUM 5 MG PO TABS: ORAL_TABLET | ORAL | 5 refills | Status: DC

## 2017-07-10 NOTE — Patient Instructions (Signed)
Happy Thanksgiving  We will start ezetimibe 10 mg once daily.  Continue rosuvastatin 5 mg twice weekly for 3 weeks total, then increase to three times per week for 3 weeks.  Continue to increase every 3 weeks as tolerated.  We will have you repeat cholesterol labs in 3 months and return for follow up.

## 2017-07-10 NOTE — Progress Notes (Signed)
07/12/2017 Robin KayserMartha J Juarez 06/10/1931 [id number]   HPI:  [name] is a 83 y.o. female patient of Dr Allyson SabalBerry, who presents today for a lipid clinic evaluation.  Patient has a medical history significant for hypertension, hyperlipidemia, prior subacute stroke, and grade 1 diastolic dysfunction.  She has also had 2 cardiac catheterizations in the past few years due to chest pain, and was found to have CAD with up to 50% stenosis in the mid LAD.  The second cath was just last month and the patient was given rosuvastatin 5 mg daily on discharge.  She spoke with her pharmacist when getting the prescription and has been slowly tapering the dose up, currently she is taking it twice weekly with a plan to increase the dose by 1 tablet every 2-3 weeks.    Current Medications:  Rosuvastatin 5 mg twice weekly  Cholesterol Goals:   LDL < 70  Intolerant/previously tried:  Pravastatin 20 mg - [id number] - March 18 did fine so dose was increased to   Pravastatin 40 mg - March 18 - Aug 18 - d/c due to myalgias in her legs  Family history:   Father had massive stroke at 81 yrs old  Mother passed 5896 from old age  Diet:   Eats out a lot, has been trying to eat healthy despite eating out.  Enjoys [place] and [place] foods, as well as salad bars.    Exercise:    No regular exercise; just housework  Labs:   06/26/17:  TC 191, TG 100, HDL 63, LDL 108  Current Outpatient Medications  Medication Sig Dispense Refill  . cholecalciferol (VITAMIN D) 1000 UNITS tablet Take 1,000 Units by mouth daily.      . clopidogrel (PLAVIX) 75 MG tablet TAKE 1 TABLET (75 MG TOTAL) BY MOUTH DAILY. 90 tablet 3  . Coenzyme Q10 (CO Q 10 PO) Take 1 tablet by mouth daily.    . DULoxetine (CYMBALTA) 30 MG capsule Take 90 mg by mouth daily.     [name]. ezetimibe (ZETIA) 10 MG tablet Take 1 tablet (10 mg total) by mouth daily. 90 tablet 3  . folic acid (FOLVITE) 800 MCG tablet Take 800 mcg by mouth daily.     [name]. gabapentin  (NEURONTIN) 300 MG capsule Take 300-600 mg by mouth 3 (three) times daily. 300 mg twice daily and 600 mg in the evening    . hydrocortisone 2.5 % ointment Apply 1 application topically daily as needed (rash).     [name]. KRILL OIL PO Take 100 mg by mouth daily.    [name]. lamoTRIgine (LAMICTAL) 100 MG tablet Take 100 mg by mouth at bedtime.    . metoprolol succinate (TOPROL-XL) 25 MG 24 hr tablet TAKE 1 TABLET (25 MG TOTAL) BY MOUTH DAILY. 90 tablet 3  . nitroGLYCERIN (NITROSTAT) 0.4 MG SL tablet Place 1 tablet (0.4 mg total) under the tongue every 5 (five) minutes as needed for chest pain. 30 tablet 0  . OLANZapine (ZYPREXA) 2.5 MG tablet Take 1 tablet (2.5 mg total) by mouth at bedtime.    . ondansetron (ZOFRAN ODT) 8 MG disintegrating tablet Take 1 tablet (8 mg total) by mouth every 8 (eight) hours as needed for nausea or vomiting. 10 tablet 0  . rosuvastatin (CRESTOR) 5 MG tablet Take up to 1 tablet by mouth daily as tolerated 30 tablet 5  . traMADol (ULTRAM) 50 MG tablet Take 1 tablet (50 mg total) by mouth every 6 (six) hours as needed for moderate  pain.     No current facility-administered medications for this visit.     Allergies  Allergen Reactions  . Digoxin Other (See Comments)    Felt weak, tired, "aweful"  . Nitrofurantoin Other (See Comments)    Sharp pain back of head  . Alendronate Sodium Other (See Comments)    cramps  . Simvastatin Other (See Comments)    Myalgia, possible intolerance  . Sulfasalazine Other (See Comments)    REACTION: (Maybe)-neck pain-brief  . Sulfonamide Derivatives Other (See Comments)    REACTION: (Maybe)-neck pain-brief  . Alendronate Other (See Comments)  . Alendronate Sodium   . Chlordiazepoxide Hcl Other (See Comments)  . Macrobid  [Nitrofurantoin Macrocrystal]   . Pb-Hyoscy-Atropine-Scopol Er Other (See Comments)  . Pravastatin Other (See Comments)    Myalgias  . Augmentin [Amoxicillin-Pot Clavulanate] Rash    In mouth  . Cefaclor Other (See  Comments)    Felt bad  . Cephalexin Other (See Comments)    Felt bad, but able to tolerate amoxil prev  . Chlordiazepoxide Other (See Comments)    REACTION: (Maybe)  . Clavulanic Acid Other (See Comments)    Not an allergy.  Tolerates plain amoxil but not augmentin- diarrhea, etc with augmentin  . Omeprazole Rash    rash  . Oxcarbazepine Other (See Comments)    unknown  . Pb-Hyoscy-Atropine-Scopolamine Other (See Comments)    Unsure-doesn't remember  . Streptomycin Other (See Comments)    Lips itched  . Sulfa Antibiotics Other (See Comments)    Unsure-doesn't remember  . Tetracycline Itching    itching  . Zetia [Ezetimibe] Other (See Comments)    possible cause of aches and abnormal dreams, stopped 05/2014    Past Medical History:  Diagnosis Date  . Arthritis    knee and hip OA- prev injection by Dr. Ethelene Halamos  . Blood transfusion without reported diagnosis    37 yrs ago -s/p back surgery  . Chest pain   . Coronary artery disease    50% mid LAD by cath January 2016  . Depression 1993, 2000  . Fibromyalgia   . GERD (gastroesophageal reflux disease)   . H/O hiatal hernia   . Hyperlipidemia   . Hypertension   . OSA (obstructive sleep apnea)    dx 2010-had a cpap-does not use-"uses special mouthpiece now"  . Osteoporosis   . Positive cardiac stress test   . Stroke (HCC)   . Urinary incontinence   . UTI (lower urinary tract infection)     There were no vitals taken for this visit.   HLD (hyperlipidemia) Patient with hyperlipidemia and LDL at 108.  She has up to 50% stenosis of the mid LAD and has had problems in the past with myalgias on pravastatin.  She is currently doing well with rosuvastatin 5 mg twice weekly and will increase by 1 tablet every 3 weeks, hopefully to tolerate daily dosing.  We reviewed the option of PCSK-9i as well as continuing with the upward taper of rosuvastatin with the possibility of adding ezetimibe.  Although the costs of PCSK-9i has come down  significantly, she is still concerned with the perceived cost.  She will start with a trial of continued tapering of rosuvastatin with a re-trial of ezetimibe.   We will repeat labs in 3 months and see her back to review further options.     Phillips HayKristin Alvstad PharmD CPP [facility] [address] [place], [place] [id number]

## 2017-07-12 ENCOUNTER — Encounter: Payer: Self-pay | Admitting: Pharmacist Clinician (PhC)/ Clinical Pharmacy Specialist

## 2017-07-12 NOTE — Assessment & Plan Note (Signed)
Patient with hyperlipidemia and LDL at 108.  She has up to 50% stenosis of the mid LAD and has had problems in the past with myalgias on pravastatin.  She is currently doing well with rosuvastatin 5 mg twice weekly and will increase by 1 tablet every 3 weeks, hopefully to tolerate daily dosing.  We reviewed the option of PCSK-9i as well as continuing with the upward taper of rosuvastatin with the possibility of adding ezetimibe.  Although the costs of PCSK-9i has come down significantly, she is still concerned with the perceived cost.  She will start with a trial of continued tapering of rosuvastatin with a re-trial of ezetimibe.   We will repeat labs in 3 months and see her back to review further options.

## 2017-07-18 ENCOUNTER — Ambulatory Visit (HOSPITAL_COMMUNITY)
Admission: RE | Admit: 2017-07-18 | Discharge: 2017-07-18 | Disposition: A | Discharge status: Home / Self Care | Payer: Medicare Other | Source: Ambulatory Visit | Attending: Cardiology | Admitting: Cardiology

## 2017-07-18 DIAGNOSIS — I739 Peripheral vascular disease, unspecified: Secondary | ICD-10-CM | POA: Insufficient documentation

## 2017-07-19 ENCOUNTER — Other Ambulatory Visit: Payer: Self-pay | Admitting: Pharmacist Clinician (PhC)/ Clinical Pharmacy Specialist

## 2017-07-19 MED ORDER — EVOLOCUMAB 140 MG/ML ~~LOC~~ SOAJ: 140.0000 mg | SUBCUTANEOUS | 12 refills | Status: DC

## 2017-07-23 ENCOUNTER — Other Ambulatory Visit: Payer: Self-pay | Admitting: *Deleted

## 2017-07-23 ENCOUNTER — Telehealth: Payer: Self-pay | Admitting: Family Medicine

## 2017-07-23 MED ORDER — DICLOFENAC SODIUM 1 % TD GEL: 2.0000 g | Freq: Four times a day (QID) | TRANSDERMAL | 2 refills | Status: DC | PRN

## 2017-07-23 MED ORDER — DICLOFENAC SODIUM 1 % TD GEL: 4.0000 g | Freq: Four times a day (QID) | TRANSDERMAL | 12 refills | Status: DC | PRN

## 2017-07-23 NOTE — Telephone Encounter (Signed)
Copied from CRM [id number]. Topic: Quick Communication - Rx Refill/Question >> Jul 23, 2017  9:51 AM Darletta MollLander, [name] wrote: Has the patient contacted their pharmacy? Yes.   (Agent: If no, request that the patient contact the pharmacy for the refill.) Preferred Pharmacy (with phone number or street name): [id number] [place], [place] - [address] Agent: Please be advised that RX refills may take up to 48 hours. We ask that you follow-up with your pharmacy.  Was sent for husband by mistake with no instructions. Please resend for [name].

## 2017-07-23 NOTE — Telephone Encounter (Addendum)
Sent. Thanks.   

## 2017-07-23 NOTE — Telephone Encounter (Signed)
Patient is requesting Diclofenac Sodium Gel 1% topical for joint and muscle pain. Can they get that called to their pharmacy? [facility].

## 2017-07-23 NOTE — Telephone Encounter (Signed)
CRM refill request.  Diclofenac Gel Last office visit:   05/15/17 Acute Last Filled:    100 g 2 07/23/2017  Please advise.

## 2017-07-24 NOTE — Telephone Encounter (Addendum)
PA submitted thru CMM for Diclofenac Gel, awaiting response.  Approval letter received and faxed to pharmacy and scanned.

## 2017-08-15 ENCOUNTER — Telehealth: Payer: Self-pay | Admitting: Cardiovascular Disease

## 2017-08-15 NOTE — Telephone Encounter (Signed)
Spoke to patient-advised I am unsure who called her on Friday, no message in chart indicating reason.  No recent test or labs.  No upcoming appts.   Patient states it may have been in regards to insurance and trying to get approved for the injections (PCSK9). Advised I would verify pharmacist did not call, if not unsure who did and apologized for the confusion.   Patient aware and verbalized understanding.

## 2017-08-15 NOTE — Telephone Encounter (Signed)
Pt said she received a message on Friday, something about appt she thinks. The message was erased,she does not know who called,Billie said she did not call her.

## 2017-08-17 NOTE — Telephone Encounter (Signed)
Did not reach out to patient at this time

## 2017-08-18 ENCOUNTER — Other Ambulatory Visit: Payer: Self-pay | Admitting: Cardiovascular Disease

## 2017-08-30 ENCOUNTER — Ambulatory Visit: Payer: Medicare Other | Admitting: Internal Medicine

## 2017-08-30 ENCOUNTER — Encounter: Payer: Self-pay | Admitting: Internal Medicine

## 2017-08-30 VITALS — BP 126/84 | HR 80 | Temp 98.1°F | Wt 184.0 lb

## 2017-08-30 DIAGNOSIS — R300 Dysuria: Secondary | ICD-10-CM

## 2017-08-30 LAB — POC URINALSYSI DIPSTICK (AUTOMATED)
Bilirubin, UA: NEGATIVE
GLUCOSE UA: NEGATIVE
KETONES UA: NEGATIVE
Nitrite, UA: NEGATIVE
Protein, UA: NEGATIVE
RBC UA: NEGATIVE
SPEC GRAV UA: 1.015 (ref 1.010–1.025)
UROBILINOGEN UA: 0.2 U/dL
pH, UA: 6 (ref 5.0–8.0)

## 2017-08-30 MED ORDER — CIPROFLOXACIN HCL 250 MG PO TABS: 250.0000 mg | ORAL_TABLET | Freq: Two times a day (BID) | ORAL | 0 refills | Status: DC

## 2017-08-30 NOTE — Addendum Note (Signed)
Addended by: Roena MaladyEVONTENNO, Braylee Bosher Y on: 08/30/2017 03:07 PM   Modules accepted: Orders

## 2017-08-30 NOTE — Patient Instructions (Signed)

## 2017-08-30 NOTE — Progress Notes (Signed)
Subjective:    Patient ID: Robin Juarez, female    DOB: 03/17/1931, 84 y.o.   MRN: [id number]  HPI  Pt presents to the clinic today with c/o dysuria. She reports this started 1 month ago. She denies urgency, frequency, or blood in her urine. She denies fever, chills, nausea or low back pain. She has not taken anything OTC for her symptoms.   Review of Systems      Past Medical History:  Diagnosis Date  . Arthritis    knee and hip OA- prev injection by Dr. Ethelene Hal  . Blood transfusion without reported diagnosis    37 yrs ago -s/p back surgery  . Chest pain   . Coronary artery disease    50% mid LAD by cath January 2016  . Depression 1993, 2000  . Fibromyalgia   . GERD (gastroesophageal reflux disease)   . H/O hiatal hernia   . Hyperlipidemia   . Hypertension   . OSA (obstructive sleep apnea)    dx 2010-had a cpap-does not use-"uses special mouthpiece now"  . Osteoporosis   . Positive cardiac stress test   . Stroke (HCC)   . Urinary incontinence   . UTI (lower urinary tract infection)     Current Outpatient Medications  Medication Sig Dispense Refill  . cholecalciferol (VITAMIN D) 1000 UNITS tablet Take 1,000 Units by mouth daily.      . clopidogrel (PLAVIX) 75 MG tablet TAKE 1 TABLET (75 MG TOTAL) BY MOUTH DAILY. 90 tablet 3  . Coenzyme Q10 (CO Q 10 PO) Take 1 tablet by mouth daily.    . diclofenac sodium (VOLTAREN) 1 % GEL Apply 2 g topically 4 (four) times daily as needed (for joint aches). 100 g 2  . DULoxetine (CYMBALTA) 30 MG capsule Take 90 mg by mouth daily.     . Evolocumab (REPATHA SURECLICK) 140 MG/ML SOAJ Inject 140 mg into the skin every 14 (fourteen) days. 2 pen 12  . folic acid (FOLVITE) 800 MCG tablet Take 800 mcg by mouth daily.     [name] gabapentin (NEURONTIN) 300 MG capsule Take 300-600 mg by mouth 3 (three) times daily. 300 mg twice daily and 600 mg in the evening    . hydrocortisone 2.5 % ointment Apply 1 application topically daily as needed  (rash).     [name] KRILL OIL PO Take 100 mg by mouth daily.    [name] lamoTRIgine (LAMICTAL) 100 MG tablet Take 100 mg by mouth at bedtime.    . metoprolol succinate (TOPROL-XL) 25 MG 24 hr tablet TAKE 1 TABLET (25 MG TOTAL) BY MOUTH DAILY. 90 tablet 3  . OLANZapine (ZYPREXA) 2.5 MG tablet Take 1 tablet (2.5 mg total) by mouth at bedtime.    . rosuvastatin (CRESTOR) 5 MG tablet Take up to 1 tablet by mouth daily as tolerated 30 tablet 5   No current facility-administered medications for this visit.     Allergies  Allergen Reactions  . Digoxin Other (See Comments)    Felt weak, tired, "aweful"  . Nitrofurantoin Other (See Comments)    Sharp pain back of head  . Alendronate Sodium Other (See Comments)    cramps  . Simvastatin Other (See Comments)    Myalgia, possible intolerance  . Sulfasalazine Other (See Comments)    REACTION: (Maybe)-neck pain-brief  . Sulfonamide Derivatives Other (See Comments)    REACTION: (Maybe)-neck pain-brief  . Alendronate Other (See Comments)  . Alendronate Sodium   . Chlordiazepoxide Hcl Other (See Comments)  .  Macrobid  [Nitrofurantoin Macrocrystal]   . Pb-Hyoscy-Atropine-Scopol Er Other (See Comments)  . Pravastatin Other (See Comments)    Myalgias  . Augmentin [Amoxicillin-Pot Clavulanate] Rash    In mouth  . Cefaclor Other (See Comments)    Felt bad  . Cephalexin Other (See Comments)    Felt bad, but able to tolerate amoxil prev  . Chlordiazepoxide Other (See Comments)    REACTION: (Maybe)  . Clavulanic Acid Other (See Comments)    Not an allergy.  Tolerates plain amoxil but not augmentin- diarrhea, etc with augmentin  . Omeprazole Rash    rash  . Oxcarbazepine Other (See Comments)    unknown  . Pb-Hyoscy-Atropine-Scopolamine Other (See Comments)    Unsure-doesn't remember  . Streptomycin Other (See Comments)    Lips itched  . Sulfa Antibiotics Other (See Comments)    Unsure-doesn't remember  . Tetracycline Itching    itching  . Zetia  [Ezetimibe] Other (See Comments)    possible cause of aches and abnormal dreams, stopped 05/2014    Family History  Problem Relation Age of Onset  . Arthritis Mother   . Stroke Father   . Hypertension Father   . Heart disease Brother        Died during CABG  . Hypertension Sister   . Dementia Sister   . Aneurysm Sister        Brain  . Parkinsonism Sister   . Kidney disease Maternal Grandmother   . Colon cancer Neg Hx   . Breast cancer Neg Hx     Social History   Socioeconomic History  . Marital status: Married    Spouse name: Not on file  . Number of children: 4  . Years of education: Not on file  . Highest education level: Not on file  Social Needs  . Financial resource strain: Not on file  . Food insecurity - worry: Not on file  . Food insecurity - inability: Not on file  . Transportation needs - medical: Not on file  . Transportation needs - non-medical: Not on file  Occupational History  . Occupation: RETIRED    Employer: RETIRED  Tobacco Use  . Smoking status: Never Smoker  . Smokeless tobacco: Never Used  . Tobacco comment: a small amount when 84 yo  Substance and Sexual Activity  . Alcohol use: No    Alcohol/week: 0.0 oz  . Drug use: No  . Sexual activity: Not Currently  Other Topics Concern  . Not on file  Social History Narrative   Married 1953, husband is well.   5 pregnancies, 4 live births, all well (5th was unexpect pregnancy and patient needed to terminate due to concurrent medical problems at the time)   11 grandchildren.  3 great grandchildren   Regular exercise:  No     Constitutional: Denies fever, malaise, fatigue, headache or abrupt weight changes.  Gastrointestinal: Denies abdominal pain, bloating, constipation, diarrhea or blood in the stool.  GU: Pt reports dysuria. Denies urgency, frequency, burning sensation, blood in urine, odor or discharge.   No other specific complaints in a complete review of systems (except as listed in HPI  above).  Objective:   Physical Exam  Wt 184 lb (83.5 kg)   BMI 34.77 kg/m  Wt Readings from Last 3 Encounters:  08/30/17 184 lb (83.5 kg)  06/19/17 180 lb 6.4 oz (81.8 kg)  06/16/17 178 lb 11.2 oz (81.1 kg)    General: Appears her stated age, well developed, well nourished  in NAD. Abdomen: Soft and nontender. Normal bowel sounds. No distention or masses noted. No CVA tenderness noted.  BMET    Component Value Date/Time   NA 140 06/16/2017 0501   K 3.7 06/16/2017 0501   CL 105 06/16/2017 0501   CO2 26 06/16/2017 0501   GLUCOSE 122 (H) 06/16/2017 0501   BUN 7 06/16/2017 0501   CREATININE 0.82 06/16/2017 0501   CREATININE 0.81 08/31/2014 1621   CALCIUM 9.3 06/16/2017 0501   CALCIUM 9.3 05/23/2010 2201   GFRNONAA >60 06/16/2017 0501   GFRAA >60 06/16/2017 0501    Lipid Panel     Component Value Date/Time   CHOL 191 06/26/2017 1006   TRIG 100 06/26/2017 1006   HDL 63 06/26/2017 1006   CHOLHDL 3.0 06/26/2017 1006   CHOLHDL 3 07/03/2016 0905   VLDL 25.0 07/03/2016 0905   LDLCALC 108 (H) 06/26/2017 1006    CBC    Component Value Date/Time   WBC 6.9 06/16/2017 0501   RBC 4.17 06/16/2017 0501   HGB 13.5 06/16/2017 0501   HCT 41.1 06/16/2017 0501   PLT 187 06/16/2017 0501   MCV 98.6 06/16/2017 0501   MCH 32.4 06/16/2017 0501   MCHC 32.8 06/16/2017 0501   RDW 13.5 06/16/2017 0501   LYMPHSABS 2.2 06/15/2017 0445   MONOABS 0.5 06/15/2017 0445   EOSABS 0.2 06/15/2017 0445   BASOSABS 0.0 06/15/2017 0445    Hgb A1C Lab Results  Component Value Date   HGBA1C 5.1 05/27/2009            Assessment & Plan:   Dysuria:  Urinalysis: 3+ leuks Will send urine culture eRx for Cipro 250 mg BID x 5 days Push fluids  Return precautions discussed Nicki Reaper, NP

## 2017-08-31 LAB — URINE CULTURE
MICRO NUMBER: 90040746
Result:: NO GROWTH
SPECIMEN QUALITY: ADEQUATE

## 2017-09-03 ENCOUNTER — Other Ambulatory Visit: Payer: Self-pay | Admitting: Family Medicine

## 2017-09-03 DIAGNOSIS — E785 Hyperlipidemia, unspecified: Secondary | ICD-10-CM

## 2017-09-07 ENCOUNTER — Telehealth: Payer: Self-pay | Admitting: Cardiovascular Disease

## 2017-09-07 NOTE — Telephone Encounter (Signed)
New Message  Riki RuskJeremy from [facility] call requesting to speak with RN about withdrawing the case for the pts repatha. Please call back to discuss

## 2017-09-10 ENCOUNTER — Ambulatory Visit (INDEPENDENT_AMBULATORY_CARE_PROVIDER_SITE_OTHER): Payer: Medicare Other

## 2017-09-10 ENCOUNTER — Encounter: Payer: Medicare Other | Admitting: Family Medicine

## 2017-09-10 VITALS — BP 124/82 | HR 73 | Temp 98.5°F | Ht 61.0 in | Wt 183.2 lb

## 2017-09-10 DIAGNOSIS — Z0000 Encounter for general adult medical examination without abnormal findings: Secondary | ICD-10-CM

## 2017-09-10 DIAGNOSIS — E785 Hyperlipidemia, unspecified: Secondary | ICD-10-CM

## 2017-09-10 LAB — COMPREHENSIVE METABOLIC PANEL
ALBUMIN: 4.4 g/dL (ref 3.5–5.2)
ALT: 15 U/L (ref 0–35)
AST: 21 U/L (ref 0–37)
Alkaline Phosphatase: 70 U/L (ref 39–117)
BUN: 13 mg/dL (ref 6–23)
CALCIUM: 9.3 mg/dL (ref 8.4–10.5)
CHLORIDE: 104 meq/L (ref 96–112)
CO2: 32 mEq/L (ref 19–32)
CREATININE: 0.74 mg/dL (ref 0.40–1.20)
GFR: 79.42 mL/min (ref >60.00)
Glucose, Bld: 98 mg/dL (ref 70–99)
Potassium: 4.2 mEq/L (ref 3.5–5.1)
Sodium: 142 mEq/L (ref 135–145)
Total Bilirubin: 0.8 mg/dL (ref 0.2–1.2)
Total Protein: 7 g/dL (ref 6.0–8.3)

## 2017-09-10 LAB — LIPID PANEL
CHOLESTEROL: 221 mg/dL — AB (ref 0–200)
HDL: 59.6 mg/dL (ref >39.00)
LDL CALC: 133 mg/dL — AB (ref 0–99)
NonHDL: 161.75
TRIGLYCERIDES: 146 mg/dL (ref 0.0–149.0)
Total CHOL/HDL Ratio: 4
VLDL: 29.2 mg/dL (ref 0.0–40.0)

## 2017-09-10 NOTE — Progress Notes (Signed)
PCP notes:   Health maintenance:  No gaps identified.  Abnormal screenings:   Hearing - failed  Hearing Screening   125Hz  250Hz  500Hz  1000Hz  2000Hz  3000Hz  4000Hz  6000Hz  8000Hz   Right ear:   40 0 40  40    Left ear:   40 0 40  40     Patient concerns:   None  Nurse concerns:  None  Next PCP appt:   09/18/17 @ 1200  I reviewed health advisor's note, was available for consultation on the day of service listed in this note, and agree with documentation and plan. Crawford GivensGraham Duncan, MD.

## 2017-09-10 NOTE — Progress Notes (Signed)
Subjective:   Robin Juarez is a 84 y.o. female who presents for Medicare Annual (Subsequent) preventive examination.  Review of Systems:  N/A Cardiac Risk Factors include: advanced age (>7355men, 76>65 women);obesity (BMI >30kg/m2);dyslipidemia;hypertension     Objective:     Vitals: BP 124/82 (BP Location: Right Arm, Patient Position: Sitting, Cuff Size: Normal)   Pulse 73   Temp 98.5 F (36.9 C) (Oral)   Ht 5\' 1"  (1.549 m) Comment: no shoes  Wt 183 lb 4 oz (83.1 kg)   SpO2 94%   BMI 34.62 kg/m   Body mass index is 34.62 kg/m.  Advanced Directives 09/10/2017 06/14/2017 06/13/2017 01/22/2017 07/16/2015 06/06/2015 08/12/2014  Does Patient Have a Medical Advance Directive? Yes Yes Yes No No No;Yes No  Type of Estate agentAdvance Directive Healthcare Power of [place];Living will Healthcare Power of [place];Living will Healthcare Power of [place];Living will - - Living will -  Does patient want to make changes to medical advance directive? - No - Patient declined - - - - -  Copy of Healthcare Power of Attorney in Chart? No - copy requested No - copy requested No - copy requested - - - -  Would patient like information on creating a medical advance directive? - - - No - Patient declined - - No - patient declined information  Pre-existing out of facility DNR order (yellow form or pink MOST form) - - - - - - -    Tobacco Social History   Tobacco Use  Smoking Status Never Smoker  Smokeless Tobacco Never Used  Tobacco Comment   a small amount when 84 yo     Counseling given: No Comment: a small amount when 84 yo   Clinical Intake:  Pre-visit preparation completed: No  Pain : No/denies pain Pain Score: 8      Nutritional Status: BMI > 30  Obese Nutritional Risks: None Diabetes: No  How often do you need to have someone help you when you read instructions, pamphlets, or other written materials from your doctor or pharmacy?: 1 - Never What is the last grade level you completed  in school?: 12th grade  Interpreter Needed?: No  Comments: pt lives with spouse Information entered by :: LPinson, LPN  Past Medical History:  Diagnosis Date  . Arthritis    knee and hip OA- prev injection by Dr. Ethelene Halamos  . Blood transfusion without reported diagnosis    37 yrs ago -s/p back surgery  . Chest pain   . Coronary artery disease    50% mid LAD by cath January 2016  . Depression 1993, 2000  . Fibromyalgia   . GERD (gastroesophageal reflux disease)   . H/O hiatal hernia   . Hyperlipidemia   . Hypertension   . OSA (obstructive sleep apnea)    dx 2010-had a cpap-does not use-"uses special mouthpiece now"  . Osteoporosis   . Positive cardiac stress test   . Stroke (HCC)   . Urinary incontinence   . UTI (lower urinary tract infection)    Past Surgical History:  Procedure Laterality Date  . ABDOMINAL HYSTERECTOMY  1976  . APPENDECTOMY  1964  . Back fusions  1977   missing vertebra  . BACK SURGERY     fusions lumbar area  . BREAST BIOPSY  1995   x 2  . BREAST BIOPSY  07/10/2012   Procedure: BREAST BIOPSY WITH NEEDLE LOCALIZATION;  Surgeon: Emelia LoronMatthew Wakefield, MD;  Location: [facility];  Service: General;  Laterality: Right;  .  CARDIOVASCULAR STRESS TEST  2009   Normal, Dr. Mayford Knife  . CATARACT EXTRACTION, BILATERAL Bilateral 2005  . Childbirth  [phone]  . CYSTOSCOPY N/A 11/25/2012   Procedure: CYSTOSCOPY;  Surgeon: Martina Sinner, MD;  Location: [facility];  Service: Urology;  Laterality: N/A;  . dilatation and currettage  1964  . LEFT HEART CATH AND CORONARY ANGIOGRAPHY N/A 06/15/2017   Procedure: LEFT HEART CATH AND CORONARY ANGIOGRAPHY;  Surgeon: Lennette Bihari, MD;  Location: [facility];  Service: Cardiovascular;  Laterality: N/A;  . LEFT HEART CATHETERIZATION WITH CORONARY ANGIOGRAM N/A 09/03/2014   Procedure: LEFT HEART CATHETERIZATION WITH CORONARY ANGIOGRAM;  Surgeon: Runell Gess, MD;  Location: [facility]  CATH LAB;  Service: Cardiovascular;  Laterality: N/A;  . LUMBAR LAMINECTOMY/DECOMPRESSION MICRODISCECTOMY N/A 05/01/2013   Procedure: COMPLETE DECOMPRESSIVE LUMBAR LAMINECTOMY L3-L4 MICRODISCECTOMY L3-L4 ON THE LEFT;  Surgeon: Jacki Cones, MD;  Location: [facility];  Service: Orthopedics;  Laterality: N/A;  . OVARIAN CYST REMOVAL  1964  . PUBOVAGINAL SLING N/A 11/25/2012   Procedure: Leonides Grills;  Surgeon: Martina Sinner, MD;  Location[facility];  Service: Urology;  Laterality: N/A;   Family History  Problem Relation Age of Onset  . Arthritis Mother   . Stroke Father   . Hypertension Father   . Heart disease Brother        Died during CABG  . Hypertension Sister   . Dementia Sister   . Aneurysm Sister        Brain  . Parkinsonism Sister   . Kidney disease Maternal Grandmother   . Colon cancer Neg Hx   . Breast cancer Neg Hx    Social History   Socioeconomic History  . Marital status: Married    Spouse name: None  . Number of children: 4  . Years of education: None  . Highest education level: None  Social Needs  . Financial resource strain: None  . Food insecurity - worry: None  . Food insecurity - inability: None  . Transportation needs - medical: None  . Transportation needs - non-medical: None  Occupational History  . Occupation: RETIRED    Employer: RETIRED  Tobacco Use  . Smoking status: Never Smoker  . Smokeless tobacco: Never Used  . Tobacco comment: a small amount when 84 yo  Substance and Sexual Activity  . Alcohol use: No    Alcohol/week: 0.0 oz  . Drug use: No  . Sexual activity: Not Currently  Other Topics Concern  . None  Social History Narrative   Married 1953, husband is well.   5 pregnancies, 4 live births, all well (5th was unexpect pregnancy and patient needed to terminate due to concurrent medical problems at the time)   11 grandchildren.  3 great grandchildren   Regular exercise:  No    Outpatient Encounter  Medications as of 09/10/2017  Medication Sig  . cholecalciferol (VITAMIN D) 1000 UNITS tablet Take 1,000 Units by mouth daily.    . clopidogrel (PLAVIX) 75 MG tablet TAKE 1 TABLET (75 MG TOTAL) BY MOUTH DAILY.  [name] Coenzyme Q10 (CO Q 10 PO) Take 1 tablet by mouth daily.  . diclofenac sodium (VOLTAREN) 1 % GEL Apply 2 g topically 4 (four) times daily as needed (for joint aches).  . DULoxetine (CYMBALTA) 30 MG capsule Take 90 mg by mouth daily.   . Evolocumab (REPATHA SURECLICK) 140 MG/ML SOAJ Inject 140 mg into the skin every 14 (fourteen) days.  [name]  folic acid (FOLVITE) 800 MCG tablet Take 800 mcg by mouth daily.   [name] gabapentin (NEURONTIN) 300 MG capsule Take 300-600 mg by mouth 3 (three) times daily. 300 mg twice daily and 600 mg in the evening  . hydrocortisone 2.5 % ointment Apply 1 application topically daily as needed (rash).   [name] KRILL OIL PO Take 100 mg by mouth daily.  [name] lamoTRIgine (LAMICTAL) 100 MG tablet Take 100 mg by mouth at bedtime.  . metoprolol succinate (TOPROL-XL) 25 MG 24 hr tablet TAKE 1 TABLET (25 MG TOTAL) BY MOUTH DAILY.  [name] OLANZapine (ZYPREXA) 2.5 MG tablet Take 1 tablet (2.5 mg total) by mouth at bedtime.  . [DISCONTINUED] ciprofloxacin (CIPRO) 250 MG tablet Take 1 tablet (250 mg total) by mouth 2 (two) times daily.  . [DISCONTINUED] rosuvastatin (CRESTOR) 5 MG tablet Take up to 1 tablet by mouth daily as tolerated   No facility-administered encounter medications on file as of 09/10/2017.     Activities of Daily Living In your present state of health, do you have any difficulty performing the following activities: 09/10/2017 06/14/2017  Hearing? N N  Vision? N N  Difficulty concentrating or making decisions? N N  Walking or climbing stairs? N Y  Dressing or bathing? N N  Doing errands, shopping? N N  Preparing Food and eating ? N -  Using the Toilet? N -  In the past six months, have you accidently leaked urine? N -  Do you have problems with loss of bowel control? N  -  Managing your Medications? N -  Managing your Finances? N -  Housekeeping or managing your Housekeeping? N -  Some recent data might be hidden    Patient Care Team: Joaquim Nam, MD as PCP - General Lynden Ang, MD as Consulting Physician (Psychiatry) Runell Gess, MD as Consulting Physician (Cardiology) Cathie Hoops, MD as Consulting Physician (Obstetrics and Gynecology)    Assessment:   This is a routine wellness examination for Braley.   Hearing Screening   125Hz  250Hz  500Hz  1000Hz  2000Hz  3000Hz  4000Hz  6000Hz  8000Hz   Right ear:   40 0 40  40    Left ear:   40 0 40  40    Vision Screening Comments: Last vision exam in Feb 2018 with Dr. Senaida Ores    Exercise Activities and Dietary recommendations Current Exercise Habits: The patient does not participate in regular exercise at present, Exercise limited by: None identified  Goals    . LIFESTYLE - EAT BREAKFAST     Starting 09/10/2017, I will continue to drink a healthy smoothie for breakfast.        Fall Risk Fall Risk  09/10/2017 08/05/2015 03/02/2014 03/24/2013  Falls in the past year? No No Yes No   Depression Screen PHQ 2/9 Scores 09/10/2017 08/05/2015 03/02/2014 03/24/2013  PHQ - 2 Score 0 0 0 0  PHQ- 9 Score 0 - - -     Cognitive Function MMSE - Mini Mental State Exam 09/10/2017  Orientation to time 5  Orientation to Place 5  Registration 3  Attention/ Calculation 0  Recall 3  Language- name 2 objects 0  Language- repeat 1  Language- follow 3 step command 3  Language- read & follow direction 0  Write a sentence 0  Copy design 0  Total score 20     PLEASE NOTE: A Mini-Cog screen was completed. Maximum score is 20. A value of 0 denotes this part of Folstein MMSE was not completed or  the patient failed this part of the Mini-Cog screening.   Mini-Cog Screening Orientation to Time - Max 5 pts Orientation to Place - Max 5 pts Registration - Max 3 pts Recall - Max 3 pts Language Repeat - Max 1  pts Language Follow 3 Step Command - Max 3 pts     Immunization History  Administered Date(s) Administered  . Influenza Split 06/01/2011, 05/03/2012  . Influenza Whole 06/15/2010  . Influenza,inj,Quad PF,6+ Mos 06/10/2013, 06/18/2014, 05/25/2015, 05/23/2016, 06/06/2017  . Pneumococcal Conjugate-13 11/17/2014  . Pneumococcal Polysaccharide-23 08/21/1998  . Td 08/22/2003  . Tdap 04/21/2014  . Zoster 08/22/2007    Screening Tests Health Maintenance  Topic Date Due  . TETANUS/TDAP  04/21/2024  . INFLUENZA VACCINE  Completed  . DEXA SCAN  Completed  . PNA vac Low Risk Adult  Completed      Plan:     I have personally reviewed, addressed, and noted the following in the patient's chart:  A. Medical and social history B. Use of alcohol, tobacco or illicit drugs  C. Current medications and supplements D. Functional ability and status E.  Nutritional status F.  Physical activity G. Advance directives H. List of other physicians I.  Hospitalizations, surgeries, and ER visits in previous 12 months J.  Vitals K. Screenings to include hearing, vision, cognitive, depression L. Referrals and appointments - none  In addition, I have reviewed and discussed with patient certain preventive protocols, quality metrics, and best practice recommendations. A written personalized care plan for preventive services as well as general preventive health recommendations were provided to patient.  See attached scanned questionnaire for additional information.   Signed,   Randa Evens, MHA, BS, LPN Health Coach

## 2017-09-10 NOTE — Progress Notes (Signed)
Pre visit review using our clinic review tool, if applicable. No additional management support is needed unless otherwise documented below in the visit note. 

## 2017-09-10 NOTE — Patient Instructions (Signed)
[name] , Thank you for taking time to come for your Medicare Wellness Visit. I appreciate your ongoing commitment to your health goals. Please review the following plan we discussed and let me know if I can assist you in the future.   These are the goals we discussed: Goals    . LIFESTYLE - EAT BREAKFAST     Starting 09/10/2017, I will continue to drink a healthy smoothie for breakfast.        This is a list of the screening recommended for you and due dates:  Health Maintenance  Topic Date Due  . Tetanus Vaccine  04/21/2024  . Flu Shot  Completed  . DEXA scan (bone density measurement)  Completed  . Pneumonia vaccines  Completed   Preventive Care for Adults  A healthy lifestyle and preventive care can promote health and wellness. Preventive health guidelines for adults include the following key practices.  . A routine yearly physical is a good way to check with your health care provider about your health and preventive screening. It is a chance to share any concerns and updates on your health and to receive a thorough exam.  . Visit your dentist for a routine exam and preventive care every 6 months. Brush your teeth twice a day and floss once a day. Good oral hygiene prevents tooth decay and gum disease.  . The frequency of eye exams is based on your age, health, family medical history, use  of contact lenses, and other factors. Follow your health care provider's recommendations for frequency of eye exams.  . Eat a healthy diet. Foods like vegetables, fruits, whole grains, low-fat dairy products, and lean protein foods contain the nutrients you need without too many calories. Decrease your intake of foods high in solid fats, added sugars, and salt. Eat the right amount of calories for you. Get information about a proper diet from your health care provider, if necessary.  . Regular physical exercise is one of the most important things you can do for your health. Most adults  should get at least 150 minutes of moderate-intensity exercise (any activity that increases your heart rate and causes you to sweat) each week. In addition, most adults need muscle-strengthening exercises on 2 or more days a week.  Silver Sneakers may be a benefit available to you. To determine eligibility, you may visit the website: www.silversneakers.com or contact program at [phone] Mon-Fri between 8AM-8PM.   . Maintain a healthy weight. The body mass index (BMI) is a screening tool to identify possible weight problems. It provides an estimate of body fat based on height and weight. Your health care provider can find your BMI and can help you achieve or maintain a healthy weight.   For adults 20 years and older: ? A BMI below 18.5 is considered underweight. ? A BMI of 18.5 to 24.9 is normal. ? A BMI of 25 to 29.9 is considered overweight. ? A BMI of 30 and above is considered obese.   . Maintain normal blood lipids and cholesterol levels by exercising and minimizing your intake of saturated fat. Eat a balanced diet with plenty of fruit and vegetables. Blood tests for lipids and cholesterol should begin at age 82 and be repeated every 5 years. If your lipid or cholesterol levels are high, you are over 50, or you are at high risk for heart disease, you may need your cholesterol levels checked more frequently. Ongoing high lipid and cholesterol levels should be treated with medicines  if diet and exercise are not working.  . If you smoke, find out from your health care provider how to quit. If you do not use tobacco, please do not start.  . If you choose to drink alcohol, please do not consume more than 2 drinks per day. One drink is considered to be 12 ounces (355 mL) of beer, 5 ounces (148 mL) of wine, or 1.5 ounces (44 mL) of liquor.  . If you are 78-66 years old, ask your health care provider if you should take aspirin to prevent strokes.  . Use sunscreen. Apply sunscreen liberally and  repeatedly throughout the day. You should seek shade when your shadow is shorter than you. Protect yourself by wearing long sleeves, pants, a wide-brimmed hat, and sunglasses year round, whenever you are outdoors.  . Once a month, do a whole body skin exam, using a mirror to look at the skin on your back. Tell your health care provider of new moles, moles that have irregular borders, moles that are larger than a pencil eraser, or moles that have changed in shape or color.

## 2017-09-11 NOTE — Telephone Encounter (Signed)
Praluent covered, will re-submit PA for this

## 2017-09-12 ENCOUNTER — Other Ambulatory Visit: Payer: Self-pay | Admitting: Pharmacist Clinician (PhC)/ Clinical Pharmacy Specialist

## 2017-09-12 ENCOUNTER — Telehealth: Payer: Self-pay | Admitting: Pharmacist Clinician (PhC)/ Clinical Pharmacy Specialist

## 2017-09-12 MED ORDER — ALIROCUMAB 150 MG/ML ~~LOC~~ SOPN: 150.0000 mg | PEN_INJECTOR | SUBCUTANEOUS | 12 refills | Status: DC

## 2017-09-12 NOTE — Telephone Encounter (Signed)
praluent 150 sent to pharmacy - Repatha not covered

## 2017-09-18 ENCOUNTER — Ambulatory Visit (INDEPENDENT_AMBULATORY_CARE_PROVIDER_SITE_OTHER): Payer: Medicare Other | Admitting: Family Medicine

## 2017-09-18 ENCOUNTER — Encounter: Payer: Self-pay | Admitting: Family Medicine

## 2017-09-18 DIAGNOSIS — Z7189 Other specified counseling: Secondary | ICD-10-CM | POA: Diagnosis not present

## 2017-09-18 DIAGNOSIS — I10 Essential (primary) hypertension: Secondary | ICD-10-CM | POA: Diagnosis not present

## 2017-09-18 NOTE — Progress Notes (Signed)
Health maintenance: DXA declined.   Mammogram up to date.  Colon cancer and pap not due.   Advance directive d/w pt. Husband designated if patient were incapacitated.  If he is also incapacitated, then her kids are equally designated.   Diet and exercise d/w pt.  Encouraged both.  She had been working on weight loss but gained it back with the holidays.   Hearing - failed.  Declined hearing aids.    Diclofenac gel helps with joint pain, no ADE on med.  Labs d/w pt.   Hypertension:    Using medication without problems or lightheadedness: yes Chest pain with exertion:no Edema:no Short of breath:only with sig exertion- ex a lot of stairs- but she thought that was typical for her age.   She has seen Dr. Allyson SabalBerry in the meantime.   She is awaiting the praluent to start.  She is off statin in the meantime.  She feels some better off statin.    She is still seeing Dr. Wynonia LawmanHejazi and her mood is good.    Meds, vitals, and allergies reviewed.   PMH and SH reviewed  ROS: Per HPI unless specifically indicated in ROS section   GEN: nad, alert and oriented HEENT: mucous membranes moist NECK: supple w/o LA CV: rrr. PULM: ctab, no inc wob ABD: soft, +bs EXT: no edema SKIN: no acute rash

## 2017-09-18 NOTE — Patient Instructions (Signed)
Don't change your meds for now and update me as needed.  Take care.  Glad to see you. 

## 2017-09-20 ENCOUNTER — Other Ambulatory Visit: Payer: Self-pay | Admitting: Pharmacist Clinician (PhC)/ Clinical Pharmacy Specialist

## 2017-09-20 MED ORDER — ALIROCUMAB 75 MG/ML ~~LOC~~ SOPN: 75.0000 mg | PEN_INJECTOR | SUBCUTANEOUS | 3 refills | Status: DC

## 2017-09-21 NOTE — Assessment & Plan Note (Signed)
Advance directive d/w pt. Husband designated if patient were incapacitated.  If he is also incapacitated, then her kids are equally designated.   

## 2017-09-21 NOTE — Assessment & Plan Note (Signed)
Reasonable control. No change in meds at this point.   She has seen cards in the meantime.   She is awaiting the praluent to start.  She is off statin in the meantime.  She feels some better off statin.   D/w pt.   If CP or progress SOB then she'll update me.   Labs d/w pt.  She agrees with plan.  >25 minutes spent in face to face time with patient, >50% spent in counselling or coordination of care.

## 2017-10-09 ENCOUNTER — Ambulatory Visit: Payer: Medicare Other

## 2017-10-11 ENCOUNTER — Ambulatory Visit: Payer: Medicare Other

## 2017-12-13 ENCOUNTER — Telehealth: Payer: Self-pay | Admitting: Family Medicine

## 2017-12-13 NOTE — Telephone Encounter (Signed)
If she had the vaccine or the disease earlier in life, the she wouldn't need the vaccine at this point.  People born in her decade had presumed exposure early in life.   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  As it stands now, it doesn't appear that she need repeat vaccination.  I appreciate her asking.   Thanks.

## 2017-12-13 NOTE — Telephone Encounter (Signed)
Patient advised.

## 2017-12-13 NOTE — Telephone Encounter (Signed)
Copied from CRM [id number]. Topic: Quick Communication - See Telephone Encounter >> Dec 13, 2017  1:29 PM Lorrine KinMcGee, Analeya Luallen B, [place] wrote: CRM for notification. See Telephone encounter for: 12/13/17. Patient calling and is wanting to know if she needs to get the measles vaccination. Was wanting to know if Dr Para Marchuncan does those or not. CB#: [phone]

## 2017-12-21 ENCOUNTER — Telehealth: Payer: Self-pay | Admitting: Cardiovascular Disease

## 2017-12-21 NOTE — Telephone Encounter (Signed)
LMOM for patient.  Repatha does list up to 3% incidence of hypertension.   Will arrange for patient to come into office to determine extent of hypertension and discuss risk/benefit issues.

## 2017-12-21 NOTE — Telephone Encounter (Signed)
Returned call to patient of Dr. Allyson Sabal. She does not know how long her BP has been elevated. She just decided to take her BP a few days ago. The only readings provided were 143/95 and 146/94. Her HR is 75 bpm today - the last time she checked her HR was 65bpm. She only takes metoprolol succinate  daily. She states her "head doesn't feel exactly right" and yesterday she noticed one of her ears felt full and "kinda ringing" and she has a slight headache. She occasionally feels like her has to catch her breath.  She is wondering if this is d/t praluent which she administered yesterday. She also has MD OV on 5/8. Will route to MD/CVRR for any recommendations prior to her appointment.

## 2017-12-21 NOTE — Telephone Encounter (Signed)
New Message:      Pt c/o BP issue: STAT if pt c/o blurred vision, one-sided weakness or slurred speech  1. What are your last 5 BP readings? 143/95 about and hour ago/146/94  2. Are you having any other symptoms (ex. Dizziness, headache, blurred vision, passed out)? No  3. What is your BP issue? Pt is having trouble with BP going up. Pt states she has ringing in her ears.

## 2017-12-24 NOTE — Telephone Encounter (Signed)
Spoke with patient, she is not sure when her pressure started to rise, or whether it is related to Repatha.  Highest home reading was 142/101.     Will bring her cuff in on Wednesday for her appt with Dr. Allyson Sabal.

## 2017-12-26 ENCOUNTER — Ambulatory Visit: Payer: Medicare Other | Admitting: Cardiovascular Disease

## 2017-12-26 ENCOUNTER — Encounter: Payer: Self-pay | Admitting: Cardiovascular Disease

## 2017-12-26 VITALS — BP 146/88 | HR 69 | Ht 61.0 in | Wt 183.0 lb

## 2017-12-26 DIAGNOSIS — E785 Hyperlipidemia, unspecified: Secondary | ICD-10-CM

## 2017-12-26 DIAGNOSIS — I10 Essential (primary) hypertension: Secondary | ICD-10-CM | POA: Diagnosis not present

## 2017-12-26 MED ORDER — LISINOPRIL 2.5 MG PO TABS: 2.5000 mg | ORAL_TABLET | Freq: Every day | ORAL | 1 refills | Status: DC

## 2017-12-26 NOTE — Patient Instructions (Signed)
Medication Instructions: Your physician recommends that you continue on your current medications as directed. Please refer to the Current Medication list given to you today.  START Lisinopril 2.5 mg daily.  Labwork: Your physician recommends that you return for a FASTING lipid profile, hepatic function panel, and BMET in 2 weeks.   Follow-Up: We request that you follow-up in: 1 month with PharmD in [facility] and in 12 months with Dr San Morelle will receive a reminder letter in the mail two months in advance. If you don't receive a letter, please call our office to schedule the follow-up appointment.  If you need a refill on your cardiac medications before your next appointment, please call your pharmacy.

## 2017-12-26 NOTE — Assessment & Plan Note (Signed)
History of hyperlipidemia on Praluent.  We will recheck a lipid and liver profile in 2 weeks

## 2017-12-26 NOTE — Progress Notes (Signed)
12/26/2017 Robin Juarez   1930-10-09  [id number]  Primary Physician Joaquim Nam, MD Primary Cardiologist: Runell Gess MD Nicholes Calamity, [place]  HPI:  Robin Juarez is a 84 y.o.  moderately overweight married Caucasian female mother of 4 children, grandmother of 72 grandchildren who self-referred for evaluation of chest pain. I last saw her in the office  06/19/2017.Her primary care physician is Dr. Crawford Givens. She took up in by one of her daughters Robin Juarez who I know. Her cardiac risk factors are notable for family history with brother does have coronary artery bypass grafting. She probably does have hyperlipidemia and hypertension which is not treated. She began having chest pain 2 weeks ago which is left precordial and radiates to her back. There are no other associated symptoms. She also initially had strokelike symptoms and had an MRI that showed a subacute stroke in the corona radiata. She had a Myoview stress test that was low risk but that show subtle anteroapical ischemia. I performed cardiac catheterization on her 09/03/14 through the right femoral approach revealing at most 50% mid LAD stenosis after a moderate sized diagonal branch with otherwise normal coronary arteries and normal LV function. I thought her chest pain was noncardiac. Since I saw her in the office a year ago she's remained clinically stable until recently when she was admitted to [facility] on 06/13/17 for chest pain. She ruled out for myocardial infarction. Her echo was normal with grade 1 diastolic dysfunction. A CT FFR suggested disease in the IV territory which led to a cardiac catheterization by Dr. Tresa Endo the same day revealing noncritical CAD. Since I saw her 6 months ago she has noticed slightly increased blood pressures which she checks at home.  She was also begun on Praluent.     Current Meds  Medication Sig  . Alirocumab (PRALUENT) 75 MG/ML SOPN Inject 75 mg into the  skin every 14 (fourteen) days.  . cholecalciferol (VITAMIN D) 1000 UNITS tablet Take 1,000 Units by mouth daily.    . clopidogrel (PLAVIX) 75 MG tablet TAKE 1 TABLET (75 MG TOTAL) BY MOUTH DAILY.  [name] Coenzyme Q10 (CO Q 10 PO) Take 1 tablet by mouth daily.  . diclofenac sodium (VOLTAREN) 1 % GEL Apply 2 g topically 4 (four) times daily as needed (for joint aches).  . DULoxetine (CYMBALTA) 30 MG capsule Take 90 mg by mouth daily.   . folic acid (FOLVITE) 800 MCG tablet Take 800 mcg by mouth daily.   [name] gabapentin (NEURONTIN) 300 MG capsule Take 300-600 mg by mouth 3 (three) times daily. 300 mg twice daily and 600 mg in the evening  . hydrocortisone 2.5 % ointment Apply 1 application topically daily as needed (rash).   [name] KRILL OIL PO Take 100 mg by mouth daily.  [name] lamoTRIgine (LAMICTAL) 100 MG tablet Take 100 mg by mouth at bedtime.  . metoprolol succinate (TOPROL-XL) 25 MG 24 hr tablet TAKE 1 TABLET (25 MG TOTAL) BY MOUTH DAILY.  [name] OLANZapine (ZYPREXA) 2.5 MG tablet Take 1 tablet (2.5 mg total) by mouth at bedtime.     Allergies  Allergen Reactions  . Digoxin Other (See Comments)    Felt weak, tired, "aweful"  . Nitrofurantoin Other (See Comments)    Sharp pain back of head  . Alendronate Sodium Other (See Comments)    cramps  . Simvastatin Other (See Comments)    Myalgia, possible intolerance  . Sulfasalazine Other (See Comments)  REACTION: (Maybe)-neck pain-brief  . Sulfonamide Derivatives Other (See Comments)    REACTION: (Maybe)-neck pain-brief  . Alendronate Other (See Comments)  . Alendronate Sodium   . Chlordiazepoxide Hcl Other (See Comments)  . Macrobid  [Nitrofurantoin Macrocrystal]   . Pb-Hyoscy-Atropine-Scopolamine Other (See Comments)  . Pravastatin Other (See Comments)    Myalgias  . Augmentin [Amoxicillin-Pot Clavulanate] Rash    In mouth  . Cefaclor Other (See Comments)    Felt bad  . Cephalexin Other (See Comments)    Felt bad, but able to tolerate amoxil prev    . Chlordiazepoxide Other (See Comments)    REACTION: (Maybe)  . Clavulanic Acid Other (See Comments)    Not an allergy.  Tolerates plain amoxil but not augmentin- diarrhea, etc with augmentin  . Omeprazole Rash    rash  . Oxcarbazepine Other (See Comments)    unknown  . Pb-Hyoscy-Atropine-Scopolamine Other (See Comments)    Unsure-doesn't remember  . Streptomycin Other (See Comments)    Lips itched  . Sulfa Antibiotics Other (See Comments)    Unsure-doesn't remember  . Tetracycline Itching    itching  . Zetia [Ezetimibe] Other (See Comments)    possible cause of aches and abnormal dreams, stopped 05/2014    Social History   Socioeconomic History  . Marital status: Married    Spouse name: Not on file  . Number of children: 4  . Years of education: Not on file  . Highest education level: Not on file  Occupational History  . Occupation: RETIRED    Employer: RETIRED  Social Needs  . Financial resource strain: Not on file  . Food insecurity:    Worry: Not on file    Inability: Not on file  . Transportation needs:    Medical: Not on file    Non-medical: Not on file  Tobacco Use  . Smoking status: Never Smoker  . Smokeless tobacco: Never Used  . Tobacco comment: a small amount when 84 yo  Substance and Sexual Activity  . Alcohol use: No    Alcohol/week: 0.0 oz  . Drug use: No  . Sexual activity: Not Currently  Lifestyle  . Physical activity:    Days per week: Not on file    Minutes per session: Not on file  . Stress: Not on file  Relationships  . Social connections:    Talks on phone: Not on file    Gets together: Not on file    Attends religious service: Not on file    Active member of club or organization: Not on file    Attends meetings of clubs or organizations: Not on file    Relationship status: Not on file  . Intimate partner violence:    Fear of current or ex partner: Not on file    Emotionally abused: Not on file    Physically abused: Not on file     Forced sexual activity: Not on file  Other Topics Concern  . Not on file  Social History Narrative   Married 1953, husband is well.   5 pregnancies, 4 live births, all well (5th was unexpect pregnancy and patient needed to terminate due to concurrent medical problems at the time)   11 grandchildren.  3 great grandchildren   Regular exercise:  No     Review of Systems: General: negative for chills, fever, night sweats or weight changes.  Cardiovascular: negative for chest pain, dyspnea on exertion, edema, orthopnea, palpitations, paroxysmal nocturnal dyspnea or shortness of breath  Dermatological: negative for rash Respiratory: negative for cough or wheezing Urologic: negative for hematuria Abdominal: negative for nausea, vomiting, diarrhea, bright red blood per rectum, melena, or hematemesis Neurologic: negative for visual changes, syncope, or dizziness All other systems reviewed and are otherwise negative except as noted above.    Blood pressure (!) 146/88, pulse 69, height  (1.549 m), weight 183 lb (83 kg).  General appearance: alert and no distress Neck: no adenopathy, no carotid bruit, no JVD, supple, symmetrical, trachea midline and thyroid not enlarged, symmetric, no tenderness/mass/nodules Lungs: clear to auscultation bilaterally Heart: regular rate and rhythm, S1, S2 normal, no murmur, click, rub or gallop Extremities: extremities normal, atraumatic, no cyanosis or edema Pulses: 2+ and symmetric Skin: Skin color, texture, turgor normal. No rashes or lesions Neurologic: Alert and oriented X 3, normal strength and tone. Normal symmetric reflexes. Normal coordination and gait  EKG sinus rhythm at 69 with borderline LVH.  I personally reviewed this EKG.  ASSESSMENT AND PLAN:   HLD (hyperlipidemia) History of hyperlipidemia on Praluent.  We will recheck a lipid and liver profile in 2 weeks  Elevated blood pressure (not hypertension) History of essential hypertension on  Toprol-XL 25 mg a day.  Over the last several months she has noticed her blood pressure has been mildly elevated and she has kept a log.  I am going to begin her on lisinopril 2.5 mg a day, we will check a basic metabolic panel in 2 weeks.  She will keep a blood pressure log over the next 30 days and see Baxter Hire back for review of her blood pressure readings, lab work and medication titration.      Runell Gess MD FACP,FACC,FAHA, [facility] 12/26/2017 11:40 AM

## 2017-12-26 NOTE — Assessment & Plan Note (Signed)
History of essential hypertension on Toprol-XL 25 mg a day.  Over the last several months she has noticed her blood pressure has been mildly elevated and she has kept a log.  I am going to begin her on lisinopril 2.5 mg a day, we will check a basic metabolic panel in 2 weeks.  She will keep a blood pressure log over the next 30 days and see Baxter Hire back for review of her blood pressure readings, lab work and medication titration.

## 2018-01-11 ENCOUNTER — Ambulatory Visit: Payer: Medicare Other | Admitting: Family Medicine

## 2018-01-11 ENCOUNTER — Encounter: Payer: Self-pay | Admitting: Family Medicine

## 2018-01-11 ENCOUNTER — Other Ambulatory Visit: Payer: Self-pay

## 2018-01-11 VITALS — BP 108/72 | HR 73 | Temp 98.3°F | Ht 61.0 in | Wt 185.5 lb

## 2018-01-11 DIAGNOSIS — R300 Dysuria: Secondary | ICD-10-CM

## 2018-01-11 LAB — POC URINALSYSI DIPSTICK (AUTOMATED)
Bilirubin, UA: NEGATIVE
Blood, UA: NEGATIVE
Glucose, UA: NEGATIVE
Ketones, UA: NEGATIVE
LEUKOCYTES UA: NEGATIVE
NITRITE UA: NEGATIVE
PH UA: 7 (ref 5.0–8.0)
PROTEIN UA: NEGATIVE
Spec Grav, UA: 1.01 (ref 1.010–1.025)
UROBILINOGEN UA: 0.2 U/dL

## 2018-01-11 NOTE — Patient Instructions (Signed)
Avoid cinnamon, citrus, tomato, alcohol , caffeine, soda, chocolate, spicy foods.  keep up with the cranberry but most importantly water.  Call if still not improving after 1-2 weeks.

## 2018-01-11 NOTE — Progress Notes (Signed)
   Subjective:    Patient ID: Robin Juarez, female    DOB: 03/20/1931, 84 y.o.   MRN: [id number]  Dysuria   This is a new problem. The current episode started in the past 7 days. The problem occurs every urination. The problem has been gradually worsening. The quality of the pain is described as burning. The pain is moderate. There has been no fever. She is not sexually active. There is no history of pyelonephritis. Associated symptoms include frequency and urgency. Pertinent negatives include no chills, discharge, flank pain, hematuria, hesitancy, nausea or vomiting. She has tried increased fluids ( azo, cranberry) for the symptoms. The treatment provided moderate relief. Her past medical history is significant for recurrent UTIs. There is no history of catheterization or a urological procedure.    hx of OAB.. Treated with botox at [facility].[name] Helped a little.   Dx enterobacter 05/15/2017 resistant to amox and cefazolin.  Lisinopril new to her in last week.  She has been eatinig a Lot of cinammon and this gave her heartburn.. May be giving urinary issues.  Blood pressure 108/72, pulse 73, temperature 98.3 F (36.8 C), temperature source Oral, height  (1.549 m), weight 185 lb 8 oz (84.1 kg).   Review of Systems  Constitutional: Negative for chills.  Gastrointestinal: Negative for nausea and vomiting.  Genitourinary: Positive for dysuria, frequency and urgency. Negative for flank pain, hematuria and hesitancy.       Objective:   Physical Exam  Constitutional: Vital signs are normal. She appears well-developed and well-nourished. She is cooperative.  Non-toxic appearance. She does not appear ill. No distress.  HENT:  Head: Normocephalic.  Right Ear: Hearing, tympanic membrane, external ear and ear canal normal. Tympanic membrane is not erythematous, not retracted and not bulging.  Left Ear: Hearing, tympanic membrane, external ear and ear canal normal. Tympanic membrane is not  erythematous, not retracted and not bulging.  Nose: No mucosal edema or rhinorrhea. Right sinus exhibits no maxillary sinus tenderness and no frontal sinus tenderness. Left sinus exhibits no maxillary sinus tenderness and no frontal sinus tenderness.  Mouth/Throat: Uvula is midline, oropharynx is clear and moist and mucous membranes are normal.  Eyes: Pupils are equal, round, and reactive to light. Conjunctivae, EOM and lids are normal. Lids are everted and swept, no foreign bodies found.  Neck: Trachea normal and normal range of motion. Neck supple. Carotid bruit is not present. No thyroid mass and no thyromegaly present.  Cardiovascular: Normal rate, regular rhythm, S1 normal, S2 normal, normal heart sounds, intact distal pulses and normal pulses. Exam reveals no gallop and no friction rub.  No murmur heard. Pulmonary/Chest: Effort normal and breath sounds normal. No tachypnea. No respiratory distress. She has no decreased breath sounds. She has no wheezes. She has no rhonchi. She has no rales.  Abdominal: Soft. Normal appearance and bowel sounds are normal. There is tenderness in the suprapubic area. There is no rigidity, no rebound, no guarding and no CVA tenderness.  Neurological: She is alert.  Skin: Skin is warm, dry and intact. No rash noted.  Psychiatric: Her speech is normal and behavior is normal. Judgment and thought content normal. Her mood appears not anxious. Cognition and memory are normal. She does not exhibit a depressed mood.          Assessment & Plan:

## 2018-01-11 NOTE — Assessment & Plan Note (Signed)
Likely bladder irritation.. Stop cinnamon and other triggers.  No sign of blood or infection.  Push water and cranberry.

## 2018-01-15 LAB — HEPATIC FUNCTION PANEL
ALBUMIN: 4.3 g/dL (ref 3.5–4.7)
ALK PHOS: 85 IU/L (ref 39–117)
ALT: 12 IU/L (ref 0–32)
AST: 15 IU/L (ref 0–40)
BILIRUBIN TOTAL: 0.6 mg/dL (ref 0.0–1.2)
BILIRUBIN, DIRECT: 0.17 mg/dL (ref 0.00–0.40)
TOTAL PROTEIN: 6.5 g/dL (ref 6.0–8.5)

## 2018-01-15 LAB — LIPID PANEL
CHOL/HDL RATIO: 2.4 ratio (ref 0.0–4.4)
Cholesterol, Total: 149 mg/dL (ref 100–199)
HDL: 63 mg/dL (ref >39)
LDL Calculated: 61 mg/dL (ref 0–99)
Triglycerides: 126 mg/dL (ref 0–149)
VLDL Cholesterol Cal: 25 mg/dL (ref 5–40)

## 2018-01-15 LAB — BASIC METABOLIC PANEL
BUN/Creatinine Ratio: 12 (ref 12–28)
BUN: 11 mg/dL (ref 8–27)
CO2: 27 mmol/L (ref 20–29)
Calcium: 9.5 mg/dL (ref 8.7–10.3)
Chloride: 102 mmol/L (ref 96–106)
Creatinine, Ser: 0.93 mg/dL (ref 0.57–1.00)
GFR calc Af Amer: 65 mL/min/{1.73_m2} (ref >59)
GFR calc non Af Amer: 57 mL/min/{1.73_m2} — ABNORMAL LOW (ref >59)
GLUCOSE: 97 mg/dL (ref 65–99)
Potassium: 4.8 mmol/L (ref 3.5–5.2)
Sodium: 143 mmol/L (ref 134–144)

## 2018-01-16 ENCOUNTER — Encounter: Payer: Self-pay | Admitting: *Deleted

## 2018-01-28 ENCOUNTER — Telehealth: Payer: Self-pay | Admitting: Family Medicine

## 2018-01-28 DIAGNOSIS — R5383 Other fatigue: Secondary | ICD-10-CM

## 2018-01-28 NOTE — Telephone Encounter (Signed)
Copied from CRM [id number]. Topic: Quick Communication - See Telephone Encounter >> Jan 28, 2018  8:52 AM Maia Pettiesrtiz, Kristie S wrote: CRM for notification. See Telephone encounter for: 01/28/18. Pt states she has been feeling sluggish and is asking to have lab work done to check her thyroid. A friend recommended to have it checked. Please advise.

## 2018-01-28 NOTE — Telephone Encounter (Signed)
Pt last seen acute visit for dysuria on 01/11/18 and annual done on 09/18/17.  Pt having fatigue for 5 or more years; getting worse feeling tired and difficulty walking any distance because legs do not want to go (Discussed with Dr Para Marchuncan at annual appt in 08/2017. Pt does not want to schedule appt she just wants her thyroid checked. Please advise.

## 2018-01-29 ENCOUNTER — Ambulatory Visit (INDEPENDENT_AMBULATORY_CARE_PROVIDER_SITE_OTHER): Payer: Medicare Other | Admitting: Pharmacist

## 2018-01-29 VITALS — BP 122/76 | HR 76

## 2018-01-29 DIAGNOSIS — I10 Essential (primary) hypertension: Secondary | ICD-10-CM | POA: Diagnosis not present

## 2018-01-29 NOTE — Telephone Encounter (Signed)
Her TSH has been checked multiple times over the years and was always normal.  That said I put in the order.  Proceed with lab visit.  Thanks.  If fatigue persists then suggest office visit.

## 2018-01-29 NOTE — Telephone Encounter (Signed)
Patient advised and states she is taking the injections for her cholesterol and is going today to talk to the pharmacist at Dr. Hazle CocaBerry's office and will talk to her about it and then get the lab appt if she would still like it done.

## 2018-01-29 NOTE — Patient Instructions (Addendum)
Return for a follow up appointment as needed  Check your blood pressure at home daily (if able) and keep record of the readings.  Take your BP meds as follows:  *STAY hydrated* *NO CHANGE in MEDICATION*  Bring all of your meds, your BP cuff and your record of home blood pressures to your next appointment.  Exercise as you're able, try to walk approximately 30 minutes per day.  Keep salt intake to a minimum, especially watch canned and prepared boxed foods.  Eat more fresh fruits and vegetables and fewer canned items.  Avoid eating in fast food restaurants.    HOW TO TAKE YOUR BLOOD PRESSURE: . Rest 5 minutes before taking your blood pressure. .  Don't smoke or drink caffeinated beverages for at least 30 minutes before. . Take your blood pressure before (not after) you eat. . Sit comfortably with your back supported and both feet on the floor (don't cross your legs). . Elevate your arm to heart level on a table or a desk. . Use the proper sized cuff. It should fit smoothly and snugly around your bare upper arm. There should be enough room to slip a fingertip under the cuff. The bottom edge of the cuff should be 1 inch above the crease of the elbow. . Ideally, take 3 measurements at one sitting and record the average.

## 2018-01-29 NOTE — Progress Notes (Signed)
Patient ID: Robin Juarez                 DOB: [birth year]                      MRN: [id number]     HPI: Robin Juarez is a 84 y.o. female referred by Dr. Allyson SabalBerry to HTN clinic. PMH includes hypertension, OSA, GERD, CVA, and atypical chest pain.  Lisinopril 2.5mg  daily was initiated during most recent office visit with DR Allyson SabalBerry. She presents today for hypertension follow up and denies . Reports increased fatigue but denies dizziness, fall, headaches , SOB or swelling. BMEt repeat done 2 weeks after initiating lisinopril showed stable renal function.  Current HTN meds:  Lisinopril 2.5mg  daily supper Metoprolol succinate 25mg  daily  BP goal: <130/80  Family history:  Father had massive stroke at 82 yrs old Mother passed 6996 from old age  Diet: Eats out a lot, has been trying to eat healthy despite eating out.  Enjoys [place] and [place] foods, as well as salad bars.    Exercise:  No regular exercise; just housework  Home BP readings:  5 readings; average 126/82  Wt Readings from Last 3 Encounters:  01/11/18 185 lb 8 oz (84.1 kg)  12/26/17 183 lb (83 kg)  09/18/17 183 lb 4 oz (83.1 kg)   BP Readings from Last 3 Encounters:  01/29/18 122/76  01/11/18 108/72  12/26/17 (!) 146/88   Pulse Readings from Last 3 Encounters:  01/29/18 76  01/11/18 73  12/26/17 69    Past Medical History:  Diagnosis Date  . Arthritis    knee and hip OA- prev injection by Dr. Ethelene Halamos  . Blood transfusion without reported diagnosis    37 yrs ago -s/p back surgery  . Chest pain   . Coronary artery disease    50% mid LAD by cath January 2016  . Depression 1993, 2000  . Fibromyalgia   . GERD (gastroesophageal reflux disease)   . H/O hiatal hernia   . Hyperlipidemia   . Hypertension   . OSA (obstructive sleep apnea)    dx 2010-had a cpap-does not use-"uses special mouthpiece now"  . Osteoporosis   . Positive cardiac stress test   . Stroke (HCC)   . Urinary incontinence   . UTI (lower  urinary tract infection)     Current Outpatient Medications on File Prior to Visit  Medication Sig Dispense Refill  . Alirocumab (PRALUENT) 75 MG/ML SOPN Inject 75 mg into the skin every 14 (fourteen) days. 6 pen 3  . cholecalciferol (VITAMIN D) 1000 UNITS tablet Take 1,000 Units by mouth daily.      . clopidogrel (PLAVIX) 75 MG tablet TAKE 1 TABLET (75 MG TOTAL) BY MOUTH DAILY. 90 tablet 3  . Coenzyme Q10 (CO Q 10 PO) Take 1 tablet by mouth daily.    . diclofenac sodium (VOLTAREN) 1 % GEL Apply 2 g topically 4 (four) times daily as needed (for joint aches). 100 g 2  . DULoxetine (CYMBALTA) 30 MG capsule Take 90 mg by mouth daily.     . folic acid (FOLVITE) 800 MCG tablet Take 800 mcg by mouth daily.     [name]. gabapentin (NEURONTIN) 300 MG capsule Take 300-600 mg by mouth 3 (three) times daily. 300 mg twice daily and 600 mg in the evening    . hydrocortisone 2.5 % ointment Apply 1 application topically daily as needed (rash).     [name]. KRILL  OIL PO Take 100 mg by mouth daily.    [name] lamoTRIgine (LAMICTAL) 100 MG tablet Take 100 mg by mouth at bedtime.    [name] lisinopril (PRINIVIL,ZESTRIL) 2.5 MG tablet Take 1 tablet (2.5 mg total) by mouth daily. 90 tablet 1  . metoprolol succinate (TOPROL-XL) 25 MG 24 hr tablet TAKE 1 TABLET (25 MG TOTAL) BY MOUTH DAILY. 90 tablet 3  . OLANZapine (ZYPREXA) 2.5 MG tablet Take 1 tablet (2.5 mg total) by mouth at bedtime.     No current facility-administered medications on file prior to visit.     Allergies  Allergen Reactions  . Digoxin Other (See Comments)    Felt weak, tired, "aweful"  . Nitrofurantoin Other (See Comments)    Sharp pain back of head  . Alendronate Sodium Other (See Comments)    cramps  . Simvastatin Other (See Comments)    Myalgia, possible intolerance  . Sulfasalazine Other (See Comments)    REACTION: (Maybe)-neck pain-brief  . Sulfonamide Derivatives Other (See Comments)    REACTION: (Maybe)-neck pain-brief  . Alendronate Other (See  Comments)  . Alendronate Sodium   . Chlordiazepoxide Hcl Other (See Comments)  . Macrobid  [Nitrofurantoin Macrocrystal]   . Pb-Hyoscy-Atropine-Scopolamine Other (See Comments)  . Pravastatin Other (See Comments)    Myalgias  . Augmentin [Amoxicillin-Pot Clavulanate] Rash    In mouth  . Cefaclor Other (See Comments)    Felt bad  . Cephalexin Other (See Comments)    Felt bad, but able to tolerate amoxil prev  . Chlordiazepoxide Other (See Comments)    REACTION: (Maybe)  . Clavulanic Acid Other (See Comments)    Not an allergy.  Tolerates plain amoxil but not augmentin- diarrhea, etc with augmentin  . Omeprazole Rash    rash  . Oxcarbazepine Other (See Comments)    unknown  . Pb-Hyoscy-Atropine-Scopolamine Other (See Comments)    Unsure-doesn't remember  . Streptomycin Other (See Comments)    Lips itched  . Sulfa Antibiotics Other (See Comments)    Unsure-doesn't remember  . Tetracycline Itching    itching  . Zetia [Ezetimibe] Other (See Comments)    possible cause of aches and abnormal dreams, stopped 05/2014    Blood pressure 122/76, pulse 76, SpO2 96 %.  Essential hypertension Blood pressure is well controlled today. Patient repots some fatigue but not dizziness or falls. Blood pressure at home also appropriate with average reading of 126/82. Will continue current medication regimens without changes. Patient to monitor BP 2-3x/week and call clinic if significant changes noted. Plan to follow up as needed.    Kristoffer Bala Rodriguez-Guzman PharmD, BCPS, CPP [facility] [address] [place] [id number] 01/30/2018 5:17 PM

## 2018-01-30 ENCOUNTER — Encounter: Payer: Self-pay | Admitting: Pharmacist

## 2018-01-30 NOTE — Assessment & Plan Note (Signed)
Blood pressure is well controlled today. Patient repots some fatigue but not dizziness or falls. Blood pressure at home also appropriate with average reading of 126/82. Will continue current medication regimens without changes. Patient to monitor BP 2-3x/week and call clinic if significant changes noted. Plan to follow up as needed.

## 2018-02-28 ENCOUNTER — Other Ambulatory Visit: Payer: Self-pay | Admitting: Pharmacist Clinician (PhC)/ Clinical Pharmacy Specialist

## 2018-02-28 MED ORDER — ALIROCUMAB 75 MG/ML ~~LOC~~ SOPN: 75.0000 mg | PEN_INJECTOR | SUBCUTANEOUS | 0 refills | Status: DC

## 2018-03-27 ENCOUNTER — Other Ambulatory Visit: Payer: Self-pay | Admitting: Family Medicine

## 2018-03-27 NOTE — Telephone Encounter (Signed)
Electronic refill request Last office visit 02/08/18 acute Last refill 04/10/17 #90/3

## 2018-03-27 NOTE — Telephone Encounter (Signed)
Sent. Thanks.   

## 2018-04-29 ENCOUNTER — Encounter (HOSPITAL_COMMUNITY): Payer: Self-pay | Admitting: Emergency Medicine

## 2018-04-29 ENCOUNTER — Emergency Department (HOSPITAL_COMMUNITY)
Admission: EM | Admit: 2018-04-29 | Discharge: 2018-04-29 | Disposition: A | Discharge status: Home / Self Care | Payer: Medicare Other | Attending: Emergency Medicine | Admitting: Emergency Medicine

## 2018-04-29 ENCOUNTER — Emergency Department (HOSPITAL_COMMUNITY): Payer: Medicare Other

## 2018-04-29 ENCOUNTER — Other Ambulatory Visit: Payer: Self-pay

## 2018-04-29 DIAGNOSIS — I10 Essential (primary) hypertension: Secondary | ICD-10-CM | POA: Diagnosis not present

## 2018-04-29 DIAGNOSIS — Z79899 Other long term (current) drug therapy: Secondary | ICD-10-CM | POA: Diagnosis not present

## 2018-04-29 DIAGNOSIS — R531 Weakness: Secondary | ICD-10-CM | POA: Insufficient documentation

## 2018-04-29 DIAGNOSIS — I2510 Atherosclerotic heart disease of native coronary artery without angina pectoris: Secondary | ICD-10-CM | POA: Diagnosis not present

## 2018-04-29 LAB — CBC
HEMATOCRIT: 42.8 % (ref 36.0–46.0)
Hemoglobin: 14 g/dL (ref 12.0–15.0)
MCH: 32.7 pg (ref 26.0–34.0)
MCHC: 32.7 g/dL (ref 30.0–36.0)
MCV: 100 fL (ref 78.0–100.0)
PLATELETS: 240 10*3/uL (ref 150–400)
RBC: 4.28 MIL/uL (ref 3.87–5.11)
RDW: 13 % (ref 11.5–15.5)
WBC: 8.4 10*3/uL (ref 4.0–10.5)

## 2018-04-29 LAB — BASIC METABOLIC PANEL
ANION GAP: 13 (ref 5–15)
BUN: 12 mg/dL (ref 8–23)
CALCIUM: 9.1 mg/dL (ref 8.9–10.3)
CO2: 23 mmol/L (ref 22–32)
CREATININE: 0.85 mg/dL (ref 0.44–1.00)
Chloride: 102 mmol/L (ref 98–111)
GFR calc Af Amer: >60 mL/min (ref >60)
GFR calc non Af Amer: >60 mL/min (ref >60)
GLUCOSE: 101 mg/dL — AB (ref 70–99)
Potassium: 4.1 mmol/L (ref 3.5–5.1)
Sodium: 138 mmol/L (ref 135–145)

## 2018-04-29 LAB — URINALYSIS, ROUTINE W REFLEX MICROSCOPIC
Bilirubin Urine: NEGATIVE
Glucose, UA: NEGATIVE mg/dL
HGB URINE DIPSTICK: NEGATIVE
Ketones, ur: NEGATIVE mg/dL
LEUKOCYTES UA: NEGATIVE
Nitrite: NEGATIVE
PH: 6 (ref 5.0–8.0)
Protein, ur: NEGATIVE mg/dL
SPECIFIC GRAVITY, URINE: 1.008 (ref 1.005–1.030)

## 2018-04-29 LAB — I-STAT TROPONIN, ED: Troponin i, poc: 0 ng/mL (ref 0.00–0.08)

## 2018-04-29 NOTE — ED Notes (Signed)
Patient taken to CT.

## 2018-04-29 NOTE — ED Triage Notes (Signed)
Per [facility] pt coming from home reports this am she had an episode of unsteadiness in her balance, but did not fall. Pt called EMS later stating "I just don't feel right." pt denies any pain during triage.

## 2018-04-29 NOTE — ED Provider Notes (Signed)
[facility] EMERGENCY DEPARTMENT Provider Note   CSN: [id number] Arrival date & time: 04/29/18  1733     History   Chief Complaint Chief Complaint  Patient presents with  . Weakness    HPI MARKEISHA MANCIAS is a 84 y.o. female.  Patient stumbled and felt "bad" - patient has generalized weakness and feels like her stomach is empty.  The history is provided by the patient.  Weakness  Primary symptoms include no focal weakness, no dizziness. This is a new problem. The current episode started 3 to 5 hours ago. The problem has not changed since onset.There was no focality noted. There has been no fever. Pertinent negatives include no shortness of breath, no chest pain, no vomiting, no altered mental status, no confusion and no headaches.    Past Medical History:  Diagnosis Date  . Arthritis    knee and hip OA- prev injection by Dr. Ethelene Hal  . Blood transfusion without reported diagnosis    37 yrs ago -s/p back surgery  . Chest pain   . Coronary artery disease    50% mid LAD by cath January 2016  . Depression 1993, 2000  . Fibromyalgia   . GERD (gastroesophageal reflux disease)   . H/O hiatal hernia   . Hyperlipidemia   . Hypertension   . OSA (obstructive sleep apnea)    dx 2010-had a cpap-does not use-"uses special mouthpiece now"  . Osteoporosis   . Positive cardiac stress test   . Stroke (HCC)   . Urinary incontinence   . UTI (lower urinary tract infection)     Patient Active Problem List   Diagnosis Date Noted  . Chest pain 06/14/2017  . Left hip pain 12/20/2016  . Skin change 03/03/2016  . Leg length discrepancy 11/19/2014  . Dysuria 10/25/2014  . Positive cardiac stress test   . Essential hypertension 08/19/2014  . Atypical chest pain 08/18/2014  . CVA (cerebral infarction) 08/18/2014  . Balance problem 07/14/2014  . Elevated blood pressure (not hypertension) 06/10/2014  . Advance care planning 03/02/2014  . Spinal stenosis, lumbar  region, with neurogenic claudication 05/01/2013  . Back pain 04/17/2013  . Paresthesia 08/21/2012  . Tremor 08/21/2012  . OSA (obstructive sleep apnea) 08/07/2012  . Medicare annual wellness visit, subsequent 01/12/2012  . Thumb pain 08/25/2011  . Fatigue 04/27/2011  . HYPERGLYCEMIA, MILD 09/12/2010  . DISORDER OF BONE AND CARTILAGE UNSPECIFIED 05/23/2010  . Anxiety state 03/01/2010  . ESOPHAGEAL SPASM 03/01/2010  . HIATAL HERNIA WITH REFLUX 03/01/2010  . GENERALIZED OSTEOARTHROSIS UNSPECIFIED SITE 03/01/2010  . RHEUMATISM UNSPECIFIED AND FIBROSITIS 03/01/2010  . Myalgia 03/01/2010  . OSTEOPOROSIS 03/01/2010  . HLD (hyperlipidemia) 02/25/2010  . MDD (major depressive disorder) 02/25/2010  . GERD 02/25/2010  . URINARY INCONTINENCE 02/25/2010  . UTI'S, HX OF 02/25/2010    Past Surgical History:  Procedure Laterality Date  . ABDOMINAL HYSTERECTOMY  1976  . APPENDECTOMY  1964  . Back fusions  1977   missing vertebra  . BACK SURGERY     fusions lumbar area  . BREAST BIOPSY  1995   x 2  . BREAST BIOPSY  07/10/2012   Procedure: BREAST BIOPSY WITH NEEDLE LOCALIZATION;  Surgeon: Emelia Loron, MD;  Location: [facility];  Service: General;  Laterality: Right;  . CARDIOVASCULAR STRESS TEST  2009   Normal, Dr. Mayford Knife  . CATARACT EXTRACTION, BILATERAL Bilateral 2005  . Childbirth  [id number]  . CYSTOSCOPY N/A 11/25/2012   Procedure: CYSTOSCOPY;  Surgeon: Martina Sinner, MD;  Location: [facility];  Service: Urology;  Laterality: N/A;  . dilatation and currettage  1964  . LEFT HEART CATH AND CORONARY ANGIOGRAPHY N/A 06/15/2017   Procedure: LEFT HEART CATH AND CORONARY ANGIOGRAPHY;  Surgeon: Lennette Bihari, MD;  Location: [facility] INVASIVE CV [facility];  Service: Cardiovascular;  Laterality: N/A;  . LEFT HEART CATHETERIZATION WITH CORONARY ANGIOGRAM N/A 09/03/2014   Procedure: LEFT HEART CATHETERIZATION WITH CORONARY ANGIOGRAM;  Surgeon: Runell Gess, MD;  Location: [facility] CATH LAB;  Service: Cardiovascular;  Laterality: N/A;  . LUMBAR LAMINECTOMY/DECOMPRESSION MICRODISCECTOMY N/A 05/01/2013   Procedure: COMPLETE DECOMPRESSIVE LUMBAR LAMINECTOMY L3-L4 MICRODISCECTOMY L3-L4 ON THE LEFT;  Surgeon: Jacki Cones, MD;  Location: [facility];  Service: Orthopedics;  Laterality: N/A;  . OVARIAN CYST REMOVAL  1964  . PUBOVAGINAL SLING N/A 11/25/2012   Procedure: Leonides Grills;  Surgeon: Martina Sinner, MD;  Location: [facility];  Service: Urology;  Laterality: N/A;     OB History   None      Home Medications    Prior to Admission medications   Medication Sig Start Date End Date Taking? Authorizing Provider  Alirocumab (PRALUENT) 75 MG/ML SOPN Inject 75 mg into the skin every 14 (fourteen) days. 02/28/18   Runell Gess, MD  cholecalciferol (VITAMIN D) 1000 UNITS tablet Take 1,000 Units by mouth daily.      [provider]  clopidogrel (PLAVIX) 75 MG tablet TAKE 1 TABLET (75 MG TOTAL) BY MOUTH DAILY. 03/27/18   Joaquim Nam, MD  Coenzyme Q10 (CO Q 10 PO) Take 1 tablet by mouth daily.    [provider]  diclofenac sodium (VOLTAREN) 1 % GEL Apply 2 g topically 4 (four) times daily as needed (for joint aches). 07/23/17   Joaquim Nam, MD  DULoxetine (CYMBALTA) 30 MG capsule Take 90 mg by mouth daily.     [provider]  folic acid (FOLVITE) 800 MCG tablet Take 800 mcg by mouth daily.     [provider]  gabapentin (NEURONTIN) 300 MG capsule Take 300-600 mg by mouth 3 (three) times daily. 300 mg twice daily and 600 mg in the evening    [provider]  hydrocortisone 2.5 % ointment Apply 1 application topically daily as needed (rash).     [provider]  KRILL OIL PO Take 100 mg by mouth daily.    [provider]  lamoTRIgine (LAMICTAL) 100 MG tablet Take 100 mg by mouth at bedtime.    [provider]  lisinopril (PRINIVIL,ZESTRIL) 2.5 MG  tablet Take 1 tablet (2.5 mg total) by mouth daily. 12/26/17 03/26/18  Runell Gess, MD  metoprolol succinate (TOPROL-XL) 25 MG 24 hr tablet TAKE 1 TABLET (25 MG TOTAL) BY MOUTH DAILY. 08/20/17   Runell Gess, MD  OLANZapine (ZYPREXA) 2.5 MG tablet Take 1 tablet (2.5 mg total) by mouth at bedtime. 08/05/15   Joaquim Nam, MD    Family History Family History  Problem Relation Age of Onset  . Arthritis Mother   . Stroke Father   . Hypertension Father   . Heart disease Brother        Died during CABG  . Hypertension Sister   . Dementia Sister   . Aneurysm Sister        Brain  . Parkinsonism Sister   . Kidney disease Maternal Grandmother   . Colon cancer Neg Hx   . Breast cancer  Neg Hx     Social History Social History   Tobacco Use  . Smoking status: Never Smoker  . Smokeless tobacco: Never Used  . Tobacco comment: a small amount when 84 yo  Substance Use Topics  . Alcohol use: No    Alcohol/week: 0.0 standard drinks  . Drug use: No     Allergies   Digoxin; Nitrofurantoin; Alendronate sodium; Simvastatin; Sulfasalazine; Sulfonamide derivatives; Alendronate; Alendronate sodium; Chlordiazepoxide hcl; Macrobid  [nitrofurantoin macrocrystal]; Pb-hyoscy-atropine-scopolamine; Pravastatin; Augmentin [amoxicillin-pot clavulanate]; Cefaclor; Cephalexin; Chlordiazepoxide; Clavulanic acid; Omeprazole; Oxcarbazepine; Pb-hyoscy-atropine-scopolamine; Streptomycin; Sulfa antibiotics; Tetracycline; and Zetia [ezetimibe]   Review of Systems Review of Systems  Constitutional: Negative for fever.  HENT: Negative for congestion and rhinorrhea.   Eyes: Negative for pain and visual disturbance.  Respiratory: Negative for shortness of breath.   Cardiovascular: Negative for chest pain.  Gastrointestinal: Negative for vomiting.  Genitourinary: Positive for difficulty urinating and urgency. Negative for dysuria and frequency.  Musculoskeletal: Negative for back pain.  Skin: Positive  for wound. Negative for rash.  Neurological: Positive for weakness. Negative for dizziness, focal weakness, syncope, facial asymmetry, speech difficulty, light-headedness, numbness and headaches.  Psychiatric/Behavioral: Negative for confusion.     Physical Exam Updated Vital Signs BP (!) 124/58   Pulse 78   Temp 98.4 F (36.9 C) (Oral)   Resp 20   SpO2 93%   Physical Exam  Constitutional: She appears well-developed and well-nourished. No distress.  HENT:  Head: Normocephalic and atraumatic.  Eyes: Conjunctivae are normal.  Neck: Neck supple.  Cardiovascular: Normal rate and regular rhythm.  No murmur heard. Pulmonary/Chest: Effort normal and breath sounds normal. No respiratory distress.  Abdominal: Soft. There is no tenderness.  Musculoskeletal: She exhibits no edema.  Neurological: She is alert.  Patient has 5/5 strength of bilateral upper and lower extremities with flexion and extension, intact sensation over bilateral upper lower extremities, cranial nerves II through XII intact, 2+ reflexes biceps and patellar tendons, no ankle clonus, finger-to-nose intact bilaterally, patient able to ambulate without difficulty or ataxia.  Skin: Skin is warm and dry.  Psychiatric: She has a normal mood and affect.  Nursing note and vitals reviewed.    ED Treatments / Results  Labs (all labs ordered are listed, but only abnormal results are displayed) Labs Reviewed  BASIC METABOLIC PANEL - Abnormal; Notable for the following components:      Result Value   Glucose, Bld 101 (*)    All other components within normal limits  URINALYSIS, ROUTINE W REFLEX MICROSCOPIC - Abnormal; Notable for the following components:   Color, Urine STRAW (*)    All other components within normal limits  CBC  I-STAT TROPONIN, ED    EKG EKG Interpretation  Date/Time:  Monday April 29 2018 17:40:39 EDT Ventricular Rate:  84 PR Interval:    QRS Duration: 95 QT Interval:  382 QTC  Calculation: 452 R Axis:   1 Text Interpretation:  Sinus rhythm Baseline wander in lead(s) V5 No significant change since last tracing Confirmed by Melene Plan [id number]) on 04/29/2018 5:43:55 PM   Radiology Ct Head Wo Contrast  Result Date: 04/29/2018 CLINICAL DATA:  Generalized weakness EXAM: CT HEAD WITHOUT CONTRAST TECHNIQUE: Contiguous axial images were obtained from the base of the skull through the vertex without intravenous contrast. COMPARISON:  MRI 08/06/2014 FINDINGS: Brain: Old lacunar infarct in the left periventricular white matter. There is atrophy and chronic small vessel disease changes. No acute intracranial abnormality. Specifically, no hemorrhage, hydrocephalus, mass lesion, acute infarction,  or significant intracranial injury. Vascular: No hyperdense vessel or unexpected calcification. Skull: No acute calvarial abnormality. Sinuses/Orbits: Visualized paranasal sinuses and mastoids clear. Orbital soft tissues unremarkable. Other: None IMPRESSION: Small old left periventricular lacunar infarct. No acute intracranial abnormality. Atrophy, chronic microvascular disease. Electronically Signed   By: Charlett Nose M.D.   On: 04/29/2018 20:33    Procedures Procedures (including critical care time)  Medications Ordered in ED Medications - No data to display   Initial Impression / Assessment and Plan / ED Course  I have reviewed the triage vital signs and the nursing notes.  Pertinent labs & imaging results that were available during my care of the patient were reviewed by me and considered in my medical decision making (see chart for details).     The patient is an 84 year old female with past medical history of CVA who presents with generalized weakness and "weakness of her stomach ", she describes her stomach is feeling empty.  She denies nausea or vomiting.  Patient reports she has had some difficulty walking for 2 years and stumbled this morning.  Then she began feeling weak and is  concerned that she is having another CVA.  Neurologic exam shows no focal abnormalities.  CT head shows no acute intracranial abnormalities, EKG shows normal sinus rhythm, troponin negative, UA shows no signs of cystitis, CBC reveals no leukocytosis or anemia, BMP reveals no electrolyte abnormalities or AKI.  Patient discharged with instructions to follow-up with her primary care provider.  Patient given return precautions.  Patient endorses agreement with an understanding of this plan.  Patient care supervised by Dr. Adela Lank.  Nash Dimmer, MD   Final Clinical Impressions(s) / ED Diagnoses   Final diagnoses:  Weakness    ED Discharge Orders    None       Nash Dimmer, MD 04/30/18 1501    Melene Plan, DO 04/30/18 1645

## 2018-06-09 ENCOUNTER — Other Ambulatory Visit: Payer: Self-pay | Admitting: Cardiovascular Disease

## 2018-06-13 ENCOUNTER — Ambulatory Visit (INDEPENDENT_AMBULATORY_CARE_PROVIDER_SITE_OTHER): Payer: Medicare Other

## 2018-06-13 DIAGNOSIS — Z23 Encounter for immunization: Secondary | ICD-10-CM

## 2018-07-22 ENCOUNTER — Telehealth: Payer: Self-pay

## 2018-07-22 NOTE — Telephone Encounter (Signed)
Needs rx filled for praluent has four left but wants to know instructions to get it filled [phone]

## 2018-07-23 NOTE — Telephone Encounter (Signed)
Patient will need to get further refills from local pharmacy instead of [facility].  She has 4 injections remaining, now knows to call when she is down to one.  Will need prior auth renewal after Sep 11, 2018

## 2018-08-01 ENCOUNTER — Other Ambulatory Visit: Payer: Self-pay

## 2018-08-01 DIAGNOSIS — E785 Hyperlipidemia, unspecified: Secondary | ICD-10-CM

## 2018-08-01 DIAGNOSIS — I10 Essential (primary) hypertension: Secondary | ICD-10-CM

## 2018-08-02 LAB — COMPREHENSIVE METABOLIC PANEL
A/G RATIO: 2.1 (ref 1.2–2.2)
ALBUMIN: 4.5 g/dL (ref 3.5–4.7)
ALK PHOS: 84 IU/L (ref 39–117)
ALT: 15 IU/L (ref 0–32)
AST: 18 IU/L (ref 0–40)
BUN / CREAT RATIO: 18 (ref 12–28)
BUN: 15 mg/dL (ref 8–27)
Bilirubin Total: 0.6 mg/dL (ref 0.0–1.2)
CALCIUM: 9.5 mg/dL (ref 8.7–10.3)
CO2: 23 mmol/L (ref 20–29)
Chloride: 102 mmol/L (ref 96–106)
Creatinine, Ser: 0.85 mg/dL (ref 0.57–1.00)
GFR calc Af Amer: 72 mL/min/{1.73_m2} (ref >59)
GFR, EST NON AFRICAN AMERICAN: 63 mL/min/{1.73_m2} (ref >59)
GLOBULIN, TOTAL: 2.1 g/dL (ref 1.5–4.5)
Glucose: 92 mg/dL (ref 65–99)
POTASSIUM: 4.4 mmol/L (ref 3.5–5.2)
SODIUM: 144 mmol/L (ref 134–144)
Total Protein: 6.6 g/dL (ref 6.0–8.5)

## 2018-08-02 LAB — LIPID PANEL
CHOL/HDL RATIO: 2.4 ratio (ref 0.0–4.4)
Cholesterol, Total: 165 mg/dL (ref 100–199)
HDL: 70 mg/dL (ref >39)
LDL CALC: 68 mg/dL (ref 0–99)
Triglycerides: 133 mg/dL (ref 0–149)
VLDL Cholesterol Cal: 27 mg/dL (ref 5–40)

## 2018-08-05 ENCOUNTER — Encounter: Payer: Self-pay | Admitting: *Deleted

## 2018-08-05 NOTE — Telephone Encounter (Signed)
This encounter was created in error - please disregard.

## 2018-08-08 ENCOUNTER — Other Ambulatory Visit: Payer: Self-pay | Admitting: Cardiovascular Disease

## 2018-08-08 NOTE — Telephone Encounter (Signed)
Rx request sent to pharmacy.  

## 2018-08-13 ENCOUNTER — Telehealth: Payer: Self-pay

## 2018-08-13 NOTE — Telephone Encounter (Signed)
Pt called stating wanting to know results of lipid panel and also wants pt assistance for praluent

## 2018-09-02 ENCOUNTER — Telehealth: Payer: Self-pay

## 2018-09-02 MED ORDER — ALIROCUMAB 75 MG/ML ~~LOC~~ SOAJ: 2.0000 "pen " | SUBCUTANEOUS | 11 refills | Status: DC

## 2018-09-02 NOTE — Telephone Encounter (Signed)
Called pt to let know that praluent pa approved and rx sent to [address]. Right hip pain

## 2018-09-02 NOTE — Telephone Encounter (Signed)
Patient reporting back pain but patient recalls recent injury after lifting heavy object.   Last Praluent dose was 5 days ago but pain getting better; therefore, doubtful pain is related to medication.

## 2018-09-03 ENCOUNTER — Emergency Department (HOSPITAL_COMMUNITY): Payer: Medicare Other

## 2018-09-03 ENCOUNTER — Encounter (HOSPITAL_COMMUNITY): Payer: Self-pay

## 2018-09-03 ENCOUNTER — Observation Stay (HOSPITAL_COMMUNITY)
Admission: EM | Admit: 2018-09-03 | Discharge: 2018-09-05 | Disposition: A | Discharge status: Home / Self Care | Payer: Medicare Other | Attending: Internal Medicine | Admitting: Internal Medicine

## 2018-09-03 DIAGNOSIS — Z8673 Personal history of transient ischemic attack (TIA), and cerebral infarction without residual deficits: Secondary | ICD-10-CM | POA: Diagnosis not present

## 2018-09-03 DIAGNOSIS — M797 Fibromyalgia: Secondary | ICD-10-CM | POA: Insufficient documentation

## 2018-09-03 DIAGNOSIS — M8448XA Pathological fracture, other site, initial encounter for fracture: Secondary | ICD-10-CM | POA: Insufficient documentation

## 2018-09-03 DIAGNOSIS — K8020 Calculus of gallbladder without cholecystitis without obstruction: Secondary | ICD-10-CM | POA: Diagnosis not present

## 2018-09-03 DIAGNOSIS — G4733 Obstructive sleep apnea (adult) (pediatric): Secondary | ICD-10-CM | POA: Diagnosis not present

## 2018-09-03 DIAGNOSIS — I10 Essential (primary) hypertension: Secondary | ICD-10-CM | POA: Diagnosis present

## 2018-09-03 DIAGNOSIS — Z881 Allergy status to other antibiotic agents status: Secondary | ICD-10-CM | POA: Insufficient documentation

## 2018-09-03 DIAGNOSIS — I2510 Atherosclerotic heart disease of native coronary artery without angina pectoris: Secondary | ICD-10-CM | POA: Insufficient documentation

## 2018-09-03 DIAGNOSIS — E785 Hyperlipidemia, unspecified: Secondary | ICD-10-CM | POA: Insufficient documentation

## 2018-09-03 DIAGNOSIS — K219 Gastro-esophageal reflux disease without esophagitis: Secondary | ICD-10-CM | POA: Diagnosis not present

## 2018-09-03 DIAGNOSIS — M48061 Spinal stenosis, lumbar region without neurogenic claudication: Secondary | ICD-10-CM

## 2018-09-03 DIAGNOSIS — Z8249 Family history of ischemic heart disease and other diseases of the circulatory system: Secondary | ICD-10-CM | POA: Insufficient documentation

## 2018-09-03 DIAGNOSIS — Z79899 Other long term (current) drug therapy: Secondary | ICD-10-CM | POA: Diagnosis not present

## 2018-09-03 DIAGNOSIS — M48062 Spinal stenosis, lumbar region with neurogenic claudication: Principal | ICD-10-CM | POA: Diagnosis present

## 2018-09-03 DIAGNOSIS — Z823 Family history of stroke: Secondary | ICD-10-CM | POA: Insufficient documentation

## 2018-09-03 DIAGNOSIS — M4317 Spondylolisthesis, lumbosacral region: Secondary | ICD-10-CM | POA: Diagnosis not present

## 2018-09-03 DIAGNOSIS — F329 Major depressive disorder, single episode, unspecified: Secondary | ICD-10-CM | POA: Diagnosis not present

## 2018-09-03 DIAGNOSIS — M545 Low back pain, unspecified: Secondary | ICD-10-CM

## 2018-09-03 DIAGNOSIS — Z882 Allergy status to sulfonamides status: Secondary | ICD-10-CM | POA: Insufficient documentation

## 2018-09-03 DIAGNOSIS — Z888 Allergy status to other drugs, medicaments and biological substances status: Secondary | ICD-10-CM | POA: Insufficient documentation

## 2018-09-03 DIAGNOSIS — R262 Difficulty in walking, not elsewhere classified: Secondary | ICD-10-CM | POA: Insufficient documentation

## 2018-09-03 DIAGNOSIS — Z7902 Long term (current) use of antithrombotics/antiplatelets: Secondary | ICD-10-CM | POA: Insufficient documentation

## 2018-09-03 DIAGNOSIS — M5136 Other intervertebral disc degeneration, lumbar region: Secondary | ICD-10-CM | POA: Insufficient documentation

## 2018-09-03 DIAGNOSIS — K5730 Diverticulosis of large intestine without perforation or abscess without bleeding: Secondary | ICD-10-CM | POA: Diagnosis not present

## 2018-09-03 LAB — CBC WITH DIFFERENTIAL/PLATELET
Abs Immature Granulocytes: 0.04 10*3/uL (ref 0.00–0.07)
BASOS PCT: 1 %
Basophils Absolute: 0.1 10*3/uL (ref 0.0–0.1)
EOS ABS: 0.2 10*3/uL (ref 0.0–0.5)
Eosinophils Relative: 2 %
HCT: 44.3 % (ref 36.0–46.0)
Hemoglobin: 14.5 g/dL (ref 12.0–15.0)
Immature Granulocytes: 1 %
Lymphocytes Relative: 27 %
Lymphs Abs: 2.3 10*3/uL (ref 0.7–4.0)
MCH: 33 pg (ref 26.0–34.0)
MCHC: 32.7 g/dL (ref 30.0–36.0)
MCV: 100.9 fL — ABNORMAL HIGH (ref 80.0–100.0)
MONO ABS: 0.5 10*3/uL (ref 0.1–1.0)
MONOS PCT: 6 %
Neutro Abs: 5.3 10*3/uL (ref 1.7–7.7)
Neutrophils Relative %: 63 %
PLATELETS: 242 10*3/uL (ref 150–400)
RBC: 4.39 MIL/uL (ref 3.87–5.11)
RDW: 13.2 % (ref 11.5–15.5)
WBC: 8.4 10*3/uL (ref 4.0–10.5)
nRBC: 0 % (ref 0.0–0.2)

## 2018-09-03 LAB — COMPREHENSIVE METABOLIC PANEL
ALT: 18 U/L (ref 0–44)
AST: 24 U/L (ref 15–41)
Albumin: 4.3 g/dL (ref 3.5–5.0)
Alkaline Phosphatase: 68 U/L (ref 38–126)
Anion gap: 10 (ref 5–15)
BUN: 11 mg/dL (ref 8–23)
CALCIUM: 9.3 mg/dL (ref 8.9–10.3)
CHLORIDE: 104 mmol/L (ref 98–111)
CO2: 27 mmol/L (ref 22–32)
CREATININE: 0.79 mg/dL (ref 0.44–1.00)
GFR calc non Af Amer: >60 mL/min (ref >60)
Glucose, Bld: 101 mg/dL — ABNORMAL HIGH (ref 70–99)
Potassium: 3.6 mmol/L (ref 3.5–5.1)
Sodium: 141 mmol/L (ref 135–145)
TOTAL PROTEIN: 7.3 g/dL (ref 6.5–8.1)
Total Bilirubin: 0.6 mg/dL (ref 0.3–1.2)

## 2018-09-03 LAB — URINALYSIS, ROUTINE W REFLEX MICROSCOPIC
BILIRUBIN URINE: NEGATIVE
Glucose, UA: NEGATIVE mg/dL
HGB URINE DIPSTICK: NEGATIVE
KETONES UR: NEGATIVE mg/dL
Leukocytes, UA: NEGATIVE
Nitrite: NEGATIVE
PROTEIN: NEGATIVE mg/dL
Specific Gravity, Urine: 1.006 (ref 1.005–1.030)
pH: 6 (ref 5.0–8.0)

## 2018-09-03 LAB — LIPASE, BLOOD: LIPASE: 29 U/L (ref 11–51)

## 2018-09-03 MED ORDER — IOPAMIDOL (ISOVUE-300) INJECTION 61%
100.0000 mL | Freq: Once | INTRAVENOUS | Status: AC | PRN
  Administered 2018-09-03: 100 mL via INTRAVENOUS

## 2018-09-03 MED ORDER — PREDNISONE 20 MG PO TABS
40.0000 mg | ORAL_TABLET | Freq: Once | ORAL | Status: AC
  Administered 2018-09-03: 40 mg via ORAL
  Filled 2018-09-03: qty 2

## 2018-09-03 MED ORDER — IOPAMIDOL (ISOVUE-300) INJECTION 61%
INTRAVENOUS | Status: AC
  Filled 2018-09-03: qty 100

## 2018-09-03 MED ORDER — FENTANYL CITRATE (PF) 100 MCG/2ML IJ SOLN: 25.0000 ug | Freq: Once | INTRAMUSCULAR | Status: DC

## 2018-09-03 MED ORDER — FENTANYL CITRATE (PF) 100 MCG/2ML IJ SOLN
25.0000 ug | Freq: Once | INTRAMUSCULAR | Status: AC
  Administered 2018-09-03: 25 ug via INTRAVENOUS
  Filled 2018-09-03: qty 2

## 2018-09-03 MED ORDER — SODIUM CHLORIDE (PF) 0.9 % IJ SOLN
INTRAMUSCULAR | Status: AC
  Filled 2018-09-03: qty 50

## 2018-09-03 MED ORDER — DOCUSATE SODIUM 100 MG PO CAPS: 100.0000 mg | ORAL_CAPSULE | Freq: Every day | ORAL | 0 refills | Status: DC

## 2018-09-03 MED ORDER — ONDANSETRON HCL 4 MG/2ML IJ SOLN
4.0000 mg | Freq: Once | INTRAMUSCULAR | Status: AC
  Administered 2018-09-03: 4 mg via INTRAVENOUS
  Filled 2018-09-03: qty 2

## 2018-09-03 MED ORDER — PREDNISONE 10 MG PO TABS: ORAL_TABLET | ORAL | 0 refills | Status: DC

## 2018-09-03 MED ORDER — OXYCODONE-ACETAMINOPHEN 5-325 MG PO TABS
1.0000 | ORAL_TABLET | Freq: Once | ORAL | Status: AC
  Administered 2018-09-03: 1 via ORAL
  Filled 2018-09-03: qty 1

## 2018-09-03 MED ORDER — OXYCODONE HCL 5 MG PO TABS: 2.5000 mg | ORAL_TABLET | Freq: Four times a day (QID) | ORAL | 0 refills | Status: DC | PRN

## 2018-09-03 NOTE — ED Provider Notes (Signed)
[facility]-EMERGENCY DEPT Provider Note   CSN: [id number] Arrival date & time: 09/03/18  1055     History   Chief Complaint Chief Complaint  Patient presents with  . Hip Pain    HPI Robin Juarez is a 85 y.o. female.  HPI   85 yo F with h/o OA, fibromyalgia, HTN, CVA, here with severe lower back and flank pain. Pt reports that over the past 2 weeks, she's had progressively worsening lower back pain which is aching, throbbing, and severe, with sharp exacerbation with any movement, particularly bending or rotation. The pain seems to radiate to her R side as well. No urinary sx, hematuria, or frequency. No nausea, vomiting, diarrhea, or constipation. No loss of bowel or bladder function. No new LE weakness or numbness. Denies any preceding illness or injuries. She states she has some chronic back pain 2/2 old fusions but the pain is now worse than usual. She has taken tylenol w/o significant relief. No fever, chills.  Past Medical History:  Diagnosis Date  . Arthritis    knee and hip OA- prev injection by Dr. Ethelene Hal  . Blood transfusion without reported diagnosis    37 yrs ago -s/p back surgery  . Chest pain   . Coronary artery disease    50% mid LAD by cath January 2016  . Depression 1993, 2000  . Fibromyalgia   . GERD (gastroesophageal reflux disease)   . H/O hiatal hernia   . Hyperlipidemia   . Hypertension   . OSA (obstructive sleep apnea)    dx 2010-had a cpap-does not use-"uses special mouthpiece now"  . Osteoporosis   . Positive cardiac stress test   . Stroke (HCC)   . Urinary incontinence   . UTI (lower urinary tract infection)     Patient Active Problem List   Diagnosis Date Noted  . Chest pain 06/14/2017  . Left hip pain 12/20/2016  . Skin change 03/03/2016  . Leg length discrepancy 11/19/2014  . Dysuria 10/25/2014  . Positive cardiac stress test   . Essential hypertension 08/19/2014  . Atypical chest pain 08/18/2014  . CVA  (cerebral infarction) 08/18/2014  . Balance problem 07/14/2014  . Elevated blood pressure (not hypertension) 06/10/2014  . Advance care planning 03/02/2014  . Spinal stenosis, lumbar region, with neurogenic claudication 05/01/2013  . Back pain 04/17/2013  . Paresthesia 08/21/2012  . Tremor 08/21/2012  . OSA (obstructive sleep apnea) 08/07/2012  . Medicare annual wellness visit, subsequent 01/12/2012  . Thumb pain 08/25/2011  . Fatigue 04/27/2011  . HYPERGLYCEMIA, MILD 09/12/2010  . DISORDER OF BONE AND CARTILAGE UNSPECIFIED 05/23/2010  . Anxiety state 03/01/2010  . ESOPHAGEAL SPASM 03/01/2010  . HIATAL HERNIA WITH REFLUX 03/01/2010  . GENERALIZED OSTEOARTHROSIS UNSPECIFIED SITE 03/01/2010  . RHEUMATISM UNSPECIFIED AND FIBROSITIS 03/01/2010  . Myalgia 03/01/2010  . OSTEOPOROSIS 03/01/2010  . HLD (hyperlipidemia) 02/25/2010  . MDD (major depressive disorder) 02/25/2010  . GERD 02/25/2010  . URINARY INCONTINENCE 02/25/2010  . UTI'S, HX OF 02/25/2010    Past Surgical History:  Procedure Laterality Date  . ABDOMINAL HYSTERECTOMY  1976  . APPENDECTOMY  1964  . Back fusions  1977   missing vertebra  . BACK SURGERY     fusions lumbar area  . BREAST BIOPSY  1995   x 2  . BREAST BIOPSY  07/10/2012   Procedure: BREAST BIOPSY WITH NEEDLE LOCALIZATION;  Surgeon: Emelia Loron, MD;  Location: [facility];  Service: General;  Laterality: Right;  .  CARDIOVASCULAR STRESS TEST  2009   Normal, Dr. Mayford Knife  . CATARACT EXTRACTION, BILATERAL Bilateral 2005  . Childbirth  [id number]  . CYSTOSCOPY N/A 11/25/2012   Procedure: CYSTOSCOPY;  Surgeon: Martina Sinner, MD;  Location: [facility];  Service: Urology;  Laterality: N/A;  . dilatation and currettage  1964  . LEFT HEART CATH AND CORONARY ANGIOGRAPHY N/A 06/15/2017   Procedure: LEFT HEART CATH AND CORONARY ANGIOGRAPHY;  Surgeon: Lennette Bihari, MD;  Location: [facility];  Service:  Cardiovascular;  Laterality: N/A;  . LEFT HEART CATHETERIZATION WITH CORONARY ANGIOGRAM N/A 09/03/2014   Procedure: LEFT HEART CATHETERIZATION WITH CORONARY ANGIOGRAM;  Surgeon: Runell Gess, MD;  Location: [facility] CATH LAB;  Service: Cardiovascular;  Laterality: N/A;  . LUMBAR LAMINECTOMY/DECOMPRESSION MICRODISCECTOMY N/A 05/01/2013   Procedure: COMPLETE DECOMPRESSIVE LUMBAR LAMINECTOMY L3-L4 MICRODISCECTOMY L3-L4 ON THE LEFT;  Surgeon: Jacki Cones, MD;  Location: [facility];  Service: Orthopedics;  Laterality: N/A;  . OVARIAN CYST REMOVAL  1964  . PUBOVAGINAL SLING N/A 11/25/2012   Procedure: Leonides Grills;  Surgeon: Martina Sinner, MD;  Location: [facility];  Service: Urology;  Laterality: N/A;     OB History   No obstetric history on file.      Home Medications    Prior to Admission medications   Medication Sig Start Date End Date Taking? Authorizing Provider  Alirocumab (PRALUENT) 75 MG/ML SOAJ Inject 2 pens into the skin every 14 (fourteen) days. 09/02/18  Yes Runell Gess, MD  cholecalciferol (VITAMIN D) 1000 UNITS tablet Take 1,000 Units by mouth daily.     Yes [provider]  clopidogrel (PLAVIX) 75 MG tablet TAKE 1 TABLET (75 MG TOTAL) BY MOUTH DAILY. Patient taking differently: Take 75 mg by mouth daily.  03/27/18  Yes Joaquim Nam, MD  Coenzyme Q10 (CO Q 10 PO) Take 1 tablet by mouth daily.   Yes [provider]  diclofenac sodium (VOLTAREN) 1 % GEL Apply 2 g topically 4 (four) times daily as needed (for joint aches). 07/23/17  Yes Joaquim Nam, MD  DULoxetine (CYMBALTA) 30 MG capsule Take 90 mg by mouth daily.    Yes [provider]  folic acid (FOLVITE) 800 MCG tablet Take 800 mcg by mouth daily.    Yes [provider]  gabapentin (NEURONTIN) 300 MG capsule Take 300-600 mg by mouth See admin instructions. 300 mg at lunch and supper and 600 mg in the evening   Yes [provider]  hydrocortisone 2.5  % ointment Apply 1 application topically daily as needed (rash).    Yes [provider]  KRILL OIL PO Take 100 mg by mouth daily.   Yes [provider]  lamoTRIgine (LAMICTAL) 100 MG tablet Take 100 mg by mouth at bedtime.   Yes [provider]  lisinopril (PRINIVIL,ZESTRIL) 2.5 MG tablet TAKE 1 TABLET BY MOUTH EVERY DAY Patient taking differently: Take 2.5 mg by mouth at bedtime.  06/10/18  Yes Runell Gess, MD  metoprolol succinate (TOPROL-XL) 25 MG 24 hr tablet TAKE 1 TABLET (25 MG TOTAL) BY MOUTH DAILY. Patient taking differently: Take 25 mg by mouth daily after supper.  08/08/18  Yes Runell Gess, MD  OLANZapine (ZYPREXA) 2.5 MG tablet Take 1 tablet (2.5 mg total) by mouth at bedtime. 08/05/15  Yes Joaquim Nam, MD  docusate sodium (COLACE) 100 MG capsule Take 1 capsule (100 mg total) by mouth daily for 10 days. 09/03/18  09/13/18  Shaune PollackIsaacs, Garielle Mroz, MD  oxyCODONE (ROXICODONE) 5 MG immediate release tablet Take 0.5-1 tablets (2.5-5 mg total) by mouth every 6 (six) hours as needed for moderate pain or severe pain (0.5 tab for moderate pain, 1 tab for severe pain). 09/03/18   Shaune PollackIsaacs, Caylor Cerino, MD  predniSONE (DELTASONE) 10 MG tablet Take 2 tablets (20 mg total) by mouth daily for 3 days, THEN 1 tablet (10 mg total) daily for 3 days, THEN 0.5 tablets (5 mg total) daily for 3 days. 09/03/18 09/12/18  Shaune PollackIsaacs, Averiana Clouatre, MD    Family History Family History  Problem Relation Age of Onset  . Arthritis Mother   . Stroke Father   . Hypertension Father   . Heart disease Brother        Died during CABG  . Hypertension Sister   . Dementia Sister   . Aneurysm Sister        Brain  . Parkinsonism Sister   . Kidney disease Maternal Grandmother   . Colon cancer Neg Hx   . Breast cancer Neg Hx     Social History Social History   Tobacco Use  . Smoking status: Never Smoker  . Smokeless tobacco: Never Used  . Tobacco comment: a small amount when 85 yo  Substance  Use Topics  . Alcohol use: No    Alcohol/week: 0.0 standard drinks  . Drug use: No     Allergies   Digoxin; Nitrofurantoin; Alendronate sodium; Simvastatin; Sulfasalazine; Sulfonamide derivatives; Alendronate; Alendronate sodium; Chlordiazepoxide hcl; Macrobid  [nitrofurantoin macrocrystal]; Pb-hyoscy-atropine-scopolamine; Pravastatin; Augmentin [amoxicillin-pot clavulanate]; Cefaclor; Cephalexin; Chlordiazepoxide; Clavulanic acid; Omeprazole; Oxcarbazepine; Pb-hyoscy-atropine-scopolamine; Streptomycin; Sulfa antibiotics; Tetracycline; and Zetia [ezetimibe]   Review of Systems Review of Systems  Constitutional: Negative for chills and fever.  HENT: Negative for congestion, rhinorrhea and sore throat.   Eyes: Negative for visual disturbance.  Respiratory: Negative for cough, shortness of breath and wheezing.   Cardiovascular: Negative for chest pain and leg swelling.  Gastrointestinal: Negative for abdominal pain, diarrhea, nausea and vomiting.  Genitourinary: Positive for flank pain. Negative for dysuria, vaginal bleeding and vaginal discharge.  Musculoskeletal: Positive for back pain and gait problem. Negative for neck pain.  Skin: Negative for rash.  Allergic/Immunologic: Negative for immunocompromised state.  Neurological: Negative for syncope and headaches.  Hematological: Does not bruise/bleed easily.  All other systems reviewed and are negative.    Physical Exam Updated Vital Signs BP 128/71 (BP Location: Right Arm)   Pulse 73   Temp 97.8 F (36.6 C) (Oral)   Resp 18   SpO2 93%   Physical Exam Vitals signs and nursing note reviewed.  Constitutional:      General: She is not in acute distress.    Appearance: She is well-developed.  HENT:     Head: Normocephalic and atraumatic.  Eyes:     Conjunctiva/sclera: Conjunctivae normal.  Neck:     Musculoskeletal: Neck supple.  Cardiovascular:     Rate and Rhythm: Normal rate and regular rhythm.     Heart sounds: Normal  heart sounds. No murmur. No friction rub.  Pulmonary:     Effort: Pulmonary effort is normal. No respiratory distress.     Breath sounds: Normal breath sounds. No wheezing or rales.  Abdominal:     General: Abdomen is flat. There is no distension.     Palpations: Abdomen is soft.     Tenderness: There is abdominal tenderness (Mild throughout R flank, though no pinpoint TTP).     Comments: Negative Murphy's. No rebound  or guarding. TTP in lower lumbar as below.  Skin:    General: Skin is warm.     Capillary Refill: Capillary refill takes less than 2 seconds.  Neurological:     Mental Status: She is alert and oriented to person, place, and time.     Motor: No abnormal muscle tone.     Spine Exam: Inspection/Palpation: Significant midline TTP throughout lower lumbar spine, without deformity or step-offs. Strength: 5/5 throughout LE bilaterally (hip flexion/extension, adduction/abduction; knee flexion/extension; foot dorsiflexion/plantarflexion, inversion/eversion; great toe inversion) Sensation: Intact to light touch in proximal and distal LE bilaterally Reflexes: 2+ quadriceps and achilles reflexes  ED Treatments / Results  Labs (all labs ordered are listed, but only abnormal results are displayed) Labs Reviewed  CBC WITH DIFFERENTIAL/PLATELET - Abnormal; Notable for the following components:      Result Value   MCV 100.9 (*)    All other components within normal limits  COMPREHENSIVE METABOLIC PANEL - Abnormal; Notable for the following components:   Glucose, Bld 101 (*)    All other components within normal limits  URINALYSIS, ROUTINE W REFLEX MICROSCOPIC - Abnormal; Notable for the following components:   Color, Urine STRAW (*)    All other components within normal limits  LIPASE, BLOOD    EKG None  Radiology Mr Lumbar Spine Wo Contrast  Result Date: 09/03/2018 CLINICAL DATA:  85 year old female with back pain radiating to the right hip for 1 month. EXAM: MRI LUMBAR  SPINE WITHOUT CONTRAST TECHNIQUE: Multiplanar, multisequence MR imaging of the lumbar spine was performed. No intravenous contrast was administered. COMPARISON:  CT Abdomen and Pelvis earlier today. Lumbar radiographs 06/06/2015. FINDINGS: Segmentation:  Normal on the comparison CT. Alignment: Mild levoconvex lumbar scoliosis has progressed since 2016. Chronic anterolisthesis of L5 on S1 appears stable and is grade 2 (11 millimeters). Mild retrolisthesis of L2 on L3 is stable. Vertebrae: Chronic bilateral L5 pars fractures better demonstrated by CT today. Chronic postoperative changes to the posterior elements of L4 and L5. No marrow edema or evidence of acute osseous abnormality. Intact visible sacrum and SI joints. Conus medullaris and cauda equina: Conus extends to the L1 level. No lower spinal cord or conus signal abnormality. Paraspinal and other soft tissues: Stable from the CT earlier today. Disc levels: No lower thoracic spinal stenosis. T12-L1: Posterior and right eccentric disc bulge. Mild right T12 foraminal stenosis. L1-L2: Circumferential disc bulge and endplate spurring with mild facet hypertrophy. Mild left and moderate right L1 foraminal stenosis. L2-L3: Disc space loss with circumferential disc osteophyte complex eccentric to the left. Mild to moderate facet and ligament flavum hypertrophy. Mild spinal stenosis. Moderate to severe bilateral L2 foraminal stenosis. L3-L4: Disc space loss with left eccentric circumferential disc osteophyte complex. Moderate to severe facet and ligament flavum hypertrophy. Trace degenerative facet joint fluid on the left and small superimposed 6 millimeter degenerative synovial cyst projecting into the spinal canal from the posterior left of midline (series 6, image 22 and series 5, image 8). Moderate to severe spinal and left greater than right lateral recess stenosis (L4 nerve levels). Severe left and mild to moderate right L3 foraminal stenosis. L4-L5: Postoperative  changes to the posterior elements which are ankylosed bilaterally. Negative disc and no stenosis. L5-S1: Grade 2 anterolisthesis with disc space loss and circumferential disc/pseudo disc. Chronic pars fractures and bilateral posterior element hypertrophy plus previous posterior decompression. No definite ankylosis at this level. No spinal or lateral recess stenosis. Moderate to severe left and severe right L5 foraminal  stenosis (series 4, image 4 on the right). IMPRESSION: 1. Previous posterior decompression of L4-L5 and L5-S1. - solid posterior element ankylosis at L4-L5 without stenosis. - at L5-S1 there are chronic pars fractures with grade 2 spondylolisthesis, and multifactorial moderate to severe right greater than left L5 foraminal stenosis. 2. Adjacent segment disease at L3-L4 with advanced disc and posterior element degeneration. Multifactorial moderate to severe spinal, left greater than right lateral recess stenosis, and left greater than right neural foraminal stenosis. 3. L2-L3 mild spinal but moderate to severe bilateral foraminal stenosis. Electronically Signed   By: Odessa Fleming M.D.   On: 09/03/2018 22:40   Ct Abdomen Pelvis W Contrast  Result Date: 09/03/2018 CLINICAL DATA:  Back and right hip pain for the past month. EXAM: CT ABDOMEN AND PELVIS WITH CONTRAST TECHNIQUE: Multidetector CT imaging of the abdomen and pelvis was performed using the standard protocol following bolus administration of intravenous contrast. CONTRAST:  ISOVUE-300 IOPAMIDOL (ISOVUE-300) INJECTION 61% COMPARISON:  Lumbar spine radiographs dated 06/06/2015 FINDINGS: Lower chest: Dense atheromatous coronary artery calcifications. Mild cylindrical bronchiectasis and scarring in the medial aspect of the right lower lobe. Hepatobiliary: Small gallstones in the dependent portion of the gallbladder. The largest measures 6 mm. No gallbladder wall thickening or pericholecystic fluid. Small cysts in the dome of the liver on the  right. Pancreas: Unremarkable. No pancreatic ductal dilatation or surrounding inflammatory changes. Spleen: Normal in size without focal abnormality. Adrenals/Urinary Tract: Normal appearing adrenal glands. Small bilateral renal cysts. Unremarkable ureters and urinary bladder. Stomach/Bowel: Multiple sigmoid colon diverticula without evidence of diverticulitis. Surgically absent appendix. Unremarkable stomach and small bowel. Vascular/Lymphatic: Atheromatous arterial calcifications without aneurysm. No enlarged lymph nodes. Reproductive: Status post hysterectomy. No adnexal masses. Other: Small umbilical hernia containing fat. Musculoskeletal: Lumbar and lower thoracic spine degenerative changes. Bilateral L5 pars interarticularis defects with associated grade 2 anterolisthesis at the L5-S1 level. Fusion of the posterior elements at the L4-5 level. Facet degenerative changes in the mid lumbar spine with grade 1 anterolisthesis at the L3-4 level and grade 1 retrolisthesis at the L2-3 level. IMPRESSION: 1. No acute abnormality. 2. Cholelithiasis. 3. Sigmoid diverticulosis. 4. Dense atheromatous coronary artery calcifications and calcific aortic atherosclerosis. 5. Bilateral L5 spondylolysis with associated grade 2 spondylolisthesis at the L5-S1 level. Electronically Signed   By: Beckie Salts M.D.   On: 09/03/2018 20:33    Procedures Procedures (including critical care time)  Medications Ordered in ED Medications  iopamidol (ISOVUE-300) 61 % injection (has no administration in time range)  sodium chloride (PF) 0.9 % injection (has no administration in time range)  fentaNYL (SUBLIMAZE) injection 25 mcg (has no administration in time range)  fentaNYL (SUBLIMAZE) injection 25 mcg (25 mcg Intravenous Given 09/03/18 1845)  iopamidol (ISOVUE-300) 61 % injection 100 mL (100 mLs Intravenous Contrast Given 09/03/18 1954)  oxyCODONE-acetaminophen (PERCOCET/ROXICET) 5-325 MG per tablet 1 tablet (1 tablet Oral Given  09/03/18 2107)  ondansetron (ZOFRAN) injection 4 mg (4 mg Intravenous Given 09/03/18 2107)  predniSONE (DELTASONE) tablet 40 mg (40 mg Oral Given 09/03/18 2256)     Initial Impression / Assessment and Plan / ED Course  I have reviewed the triage vital signs and the nursing notes.  Pertinent labs & imaging results that were available during my care of the patient were reviewed by me and considered in my medical decision making (see chart for details).     85 yo F here with severe lower back pain, R side pain. Suspect this is 2/2 spinal stenosis  based on her history, reproducibility on exam. Lab work is reassuring. CT scan obtained 2/2 age, DDx including renal pathology and is largely unremarkable with exception of profound lumbar degenerative disease. Cholelithiasis noted but LFTs normal, no RUQ TTP or signs of cholecystitis clinically. MRI obtained 2/2 her severe pain and shows chronic pars fx, severe stenosis. No cauda equina. Unfortunately, pt unable to ambulate despite analgesics. Will d/w NSGY, admit for inability to ambulate 2/2 pain. Prednisone given as well.  Dw Dr. Maurice Small - advises steroids, pain control, injections as inpt if able. He can see pt once she is out of hospital. Appreciate input.  Final Clinical Impressions(s) / ED Diagnoses   Final diagnoses:  Degenerative lumbar spinal stenosis  Acute right-sided low back pain without sciatica     Shaune Pollack, MD 09/03/18 2338

## 2018-09-03 NOTE — ED Notes (Signed)
Pt's daughter, Cloyd Stagers, called and talked to secretary stating that patient is really sick with abd pains and pt's spouse will down play patient's complaints.  Daughter can be reached at [phone]

## 2018-09-03 NOTE — Discharge Instructions (Addendum)
I would recommend the following:  Continue Tylenol 500 mg to 1000 mg every 6 hours Take the Oxycodone (prescribed) every 6-8 hours as needed  When taking the Oxycodone, be aware it can make you drowsy. I'd recommend NOT taking your Zyprexa at night if you took your Oxycodone as well. Do not drink alcohol.  Take the stool softener while taking Oxycodone to prevent constipation

## 2018-09-03 NOTE — ED Triage Notes (Signed)
Pt presents via EMS from home with c/o back to right hip pain. Pt reports the pain has been ongoing for the past month, no trauma. Pt does have a hx of back surgeries. Pt has an appointment coming up on the 24th of this month but is unable to deal with the pain at this time.

## 2018-09-04 ENCOUNTER — Other Ambulatory Visit: Payer: Self-pay

## 2018-09-04 ENCOUNTER — Encounter (HOSPITAL_COMMUNITY): Payer: Self-pay | Admitting: Internal Medicine

## 2018-09-04 DIAGNOSIS — M545 Low back pain: Secondary | ICD-10-CM | POA: Diagnosis not present

## 2018-09-04 LAB — BASIC METABOLIC PANEL
Anion gap: 10 (ref 5–15)
BUN: 13 mg/dL (ref 8–23)
CO2: 25 mmol/L (ref 22–32)
Calcium: 9.2 mg/dL (ref 8.9–10.3)
Chloride: 105 mmol/L (ref 98–111)
Creatinine, Ser: 0.87 mg/dL (ref 0.44–1.00)
GFR calc non Af Amer: >60 mL/min (ref >60)
Glucose, Bld: 137 mg/dL — ABNORMAL HIGH (ref 70–99)
Potassium: 4.4 mmol/L (ref 3.5–5.1)
Sodium: 140 mmol/L (ref 135–145)

## 2018-09-04 LAB — CBC
HCT: 43.5 % (ref 36.0–46.0)
HEMOGLOBIN: 14.2 g/dL (ref 12.0–15.0)
MCH: 33.4 pg (ref 26.0–34.0)
MCHC: 32.6 g/dL (ref 30.0–36.0)
MCV: 102.4 fL — ABNORMAL HIGH (ref 80.0–100.0)
Platelets: 232 10*3/uL (ref 150–400)
RBC: 4.25 MIL/uL (ref 3.87–5.11)
RDW: 13.2 % (ref 11.5–15.5)
WBC: 9.3 10*3/uL (ref 4.0–10.5)
nRBC: 0 % (ref 0.0–0.2)

## 2018-09-04 MED ORDER — FOLIC ACID 1 MG PO TABS
1000.0000 ug | ORAL_TABLET | Freq: Every day | ORAL | Status: DC
  Administered 2018-09-04: 1 mg via ORAL
  Filled 2018-09-04 (×2): qty 1

## 2018-09-04 MED ORDER — ACETAMINOPHEN 325 MG PO TABS
650.0000 mg | ORAL_TABLET | Freq: Four times a day (QID) | ORAL | Status: DC | PRN
  Administered 2018-09-04: 650 mg via ORAL
  Filled 2018-09-04: qty 2

## 2018-09-04 MED ORDER — OXYCODONE HCL 5 MG PO TABS
5.0000 mg | ORAL_TABLET | Freq: Four times a day (QID) | ORAL | Status: DC | PRN
  Administered 2018-09-04: 5 mg via ORAL
  Filled 2018-09-04: qty 1

## 2018-09-04 MED ORDER — OLANZAPINE 5 MG PO TABS
2.5000 mg | ORAL_TABLET | Freq: Every day | ORAL | Status: DC
  Administered 2018-09-04: 1.25 mg via ORAL
  Filled 2018-09-04 (×2): qty 0.5

## 2018-09-04 MED ORDER — FENTANYL CITRATE (PF) 100 MCG/2ML IJ SOLN: 25.0000 ug | INTRAMUSCULAR | Status: DC | PRN

## 2018-09-04 MED ORDER — LAMOTRIGINE 100 MG PO TABS
100.0000 mg | ORAL_TABLET | Freq: Every day | ORAL | Status: DC
  Administered 2018-09-04: 100 mg via ORAL
  Filled 2018-09-04: qty 1

## 2018-09-04 MED ORDER — LISINOPRIL 5 MG PO TABS
2.5000 mg | ORAL_TABLET | Freq: Every day | ORAL | Status: DC
  Administered 2018-09-04: 2.5 mg via ORAL
  Filled 2018-09-04: qty 1

## 2018-09-04 MED ORDER — GABAPENTIN 300 MG PO CAPS
600.0000 mg | ORAL_CAPSULE | Freq: Every day | ORAL | Status: DC
  Administered 2018-09-04: 600 mg via ORAL
  Filled 2018-09-04: qty 2

## 2018-09-04 MED ORDER — POLYETHYLENE GLYCOL 3350 17 G PO PACK
17.0000 g | PACK | Freq: Every day | ORAL | Status: DC
  Administered 2018-09-04 – 2018-09-05 (×2): 17 g via ORAL
  Filled 2018-09-04 (×2): qty 1

## 2018-09-04 MED ORDER — BACLOFEN 10 MG PO TABS
5.0000 mg | ORAL_TABLET | Freq: Three times a day (TID) | ORAL | Status: DC | PRN
  Administered 2018-09-04: 5 mg via ORAL
  Filled 2018-09-04 (×3): qty 1

## 2018-09-04 MED ORDER — ENOXAPARIN SODIUM 40 MG/0.4ML ~~LOC~~ SOLN
40.0000 mg | Freq: Every day | SUBCUTANEOUS | Status: DC
  Administered 2018-09-04 (×2): 40 mg via SUBCUTANEOUS
  Filled 2018-09-04 (×2): qty 0.4

## 2018-09-04 MED ORDER — GABAPENTIN 300 MG PO CAPS
300.0000 mg | ORAL_CAPSULE | ORAL | Status: DC
  Filled 2018-09-04: qty 1

## 2018-09-04 MED ORDER — DIAZEPAM 5 MG/ML IJ SOLN
5.0000 mg | Freq: Once | INTRAMUSCULAR | Status: AC
  Administered 2018-09-04: 5 mg via INTRAVENOUS
  Filled 2018-09-04: qty 2

## 2018-09-04 MED ORDER — PREDNISONE 20 MG PO TABS
40.0000 mg | ORAL_TABLET | Freq: Every day | ORAL | Status: DC
  Administered 2018-09-04 – 2018-09-05 (×2): 40 mg via ORAL
  Filled 2018-09-04 (×2): qty 2

## 2018-09-04 MED ORDER — CLOPIDOGREL BISULFATE 75 MG PO TABS
75.0000 mg | ORAL_TABLET | Freq: Every day | ORAL | Status: DC
  Administered 2018-09-04 – 2018-09-05 (×2): 75 mg via ORAL
  Filled 2018-09-04 (×2): qty 1

## 2018-09-04 MED ORDER — METOPROLOL SUCCINATE ER 25 MG PO TB24
25.0000 mg | ORAL_TABLET | Freq: Every day | ORAL | Status: DC
  Administered 2018-09-04: 25 mg via ORAL
  Filled 2018-09-04 (×2): qty 1

## 2018-09-04 MED ORDER — ONDANSETRON HCL 4 MG/2ML IJ SOLN: 4.0000 mg | Freq: Four times a day (QID) | INTRAMUSCULAR | Status: DC | PRN

## 2018-09-04 MED ORDER — ONDANSETRON HCL 4 MG PO TABS: 4.0000 mg | ORAL_TABLET | Freq: Four times a day (QID) | ORAL | Status: DC | PRN

## 2018-09-04 MED ORDER — DULOXETINE HCL 30 MG PO CPEP
90.0000 mg | ORAL_CAPSULE | Freq: Every day | ORAL | Status: DC
  Administered 2018-09-04 – 2018-09-05 (×2): 90 mg via ORAL
  Filled 2018-09-04 (×2): qty 3

## 2018-09-04 MED ORDER — ACETAMINOPHEN 650 MG RE SUPP: 650.0000 mg | Freq: Four times a day (QID) | RECTAL | Status: DC | PRN

## 2018-09-04 MED ORDER — SENNA 8.6 MG PO TABS
1.0000 | ORAL_TABLET | Freq: Every day | ORAL | Status: DC
  Administered 2018-09-04: 8.6 mg via ORAL
  Filled 2018-09-04: qty 1

## 2018-09-04 NOTE — Evaluation (Signed)
Physical Therapy Evaluation Patient Details Name: Robin Juarez MRN: [id number] DOB: 28-Feb-1931 Today's Date: 09/04/2018   History of Present Illness  Pt admitted through ED with intractable back pain.  Pt with hx of CVA, CAD and multiple back surgeries  Clinical Impression  Pt admitted as above and presenting with functional mobility limited by back pain exacerbated by activity.  Pt reports marked improvement since admission to hospital and is hoping to dc home tomorrow with assist of spouse.    Follow Up Recommendations No PT follow up    Equipment Recommendations  Rolling walker with 5" wheels(youth level RW please)    Recommendations for Other Services       Precautions / Restrictions Precautions Precautions: Fall Restrictions Weight Bearing Restrictions: No      Mobility  Bed Mobility Overal bed mobility: Needs Assistance Bed Mobility: Supine to Sit     Supine to sit: Min guard     General bed mobility comments: increased time and use of bedrail  Transfers Overall transfer level: Needs assistance Equipment used: Rolling walker (2 wheeled) Transfers: Sit to/from Stand Sit to Stand: Min guard         General transfer comment: steady assist only  Ambulation/Gait Ambulation/Gait assistance: Min guard Gait Distance (Feet): 300 Feet Assistive device: Rolling walker (2 wheeled) Gait Pattern/deviations: Step-through pattern;Decreased step length - right;Decreased step length - left;Shuffle;Trunk flexed Gait velocity: decr   General Gait Details: cues for posture and position from RW; increased time  Stairs            Wheelchair Mobility    Modified Rankin (Stroke Patients Only)       Balance Overall balance assessment: Needs assistance Sitting-balance support: No upper extremity supported;Feet supported Sitting balance-Leahy Scale: Good     Standing balance support: Bilateral upper extremity supported Standing balance-Leahy Scale:  Fair                               Pertinent Vitals/Pain Pain Assessment: 0-10 Pain Score: 6  Pain Location: back and R hip Pain Descriptors / Indicators: Aching;Sore Pain Intervention(s): Limited activity within patient's tolerance;Monitored during session;Premedicated before session    Home Living Family/patient expects to be discharged to:: Private residence Living Arrangements: Spouse/significant other Available Help at Discharge: Family Type of Home: House Home Access: Stairs to enter Entrance Stairs-Rails: Right;Left;Can reach both Secretary/administratorntrance Stairs-Number of Steps: 3 Home Layout: One level Home Equipment: Bedside commode;Cane - single point      Prior Function Level of Independence: Independent               Hand Dominance        Extremity/Trunk Assessment   Upper Extremity Assessment Upper Extremity Assessment: Overall WFL for tasks assessed    Lower Extremity Assessment Lower Extremity Assessment: Overall WFL for tasks assessed    Cervical / Trunk Assessment Cervical / Trunk Assessment: Kyphotic  Communication   Communication: No difficulties  Cognition Arousal/Alertness: Awake/alert Behavior During Therapy: WFL for tasks assessed/performed Overall Cognitive Status: Within Functional Limits for tasks assessed                                        General Comments      Exercises     Assessment/Plan    PT Assessment Patient needs continued PT services  PT Problem List Decreased activity  tolerance;Decreased balance;Decreased knowledge of use of DME;Decreased mobility;Pain       PT Treatment Interventions DME instruction;Gait training;Stair training;Functional mobility training;Therapeutic activities;Therapeutic exercise;Patient/family education    PT Goals (Current goals can be found in the Care Plan section)  Acute Rehab PT Goals Patient Stated Goal: Decresaed back pain PT Goal Formulation: With patient Time  For Goal Achievement: 09/18/18 Potential to Achieve Goals: Good    Frequency Min 3X/week   Barriers to discharge        Co-evaluation               AM-PAC PT "6 Clicks" Mobility  Outcome Measure Help needed turning from your back to your side while in a flat bed without using bedrails?: None Help needed moving from lying on your back to sitting on the side of a flat bed without using bedrails?: A Little Help needed moving to and from a bed to a chair (including a wheelchair)?: A Little Help needed standing up from a chair using your arms (e.g., wheelchair or bedside chair)?: A Little Help needed to walk in hospital room?: A Little Help needed climbing 3-5 steps with a railing? : A Little 6 Click Score: 19    End of Session   Activity Tolerance: Patient tolerated treatment well Patient left: in chair;with call bell/phone within reach;with chair alarm set Nurse Communication: Mobility status PT Visit Diagnosis: Difficulty in walking, not elsewhere classified (R26.2)    Time: [id number] PT Time Calculation (min) (ACUTE ONLY): 30 min   Charges:   PT Evaluation $PT Eval Low Complexity: 1 Low PT Treatments $Gait Training: 8-22 mins        Mauro KaufmannHunter Mally Gavina PT Acute Rehabilitation Services Pager [phone] Office [phone]   Robin Juarez 09/04/2018, 5:16 PM

## 2018-09-04 NOTE — ED Notes (Signed)
ED TO INPATIENT HANDOFF REPORT  Name/Age/Gender Robin Juarez 85 y.o. female  Code Status Code Status History    Date Active Date Inactive Code Status Order ID Comments User Context   06/14/2017 0044 06/16/2017 1707 Full Code [id number]  Eduard Clos, MD ED   09/03/2014 1156 09/03/2014 1855 Full Code [id number]  Runell Gess, MD Inpatient   05/01/2013 1740 05/03/2013 1954 Full Code [id number]  Jacki Cones, MD Inpatient    Advance Directive Documentation     Most Recent Value  Type of Advance Directive  Living will  Pre-existing out of facility DNR order (yellow form or pink MOST form)  -  "MOST" Form in Place?  -      Home/SNF/Other Home  Chief Complaint Back Pain  Level of Care/Admitting Diagnosis ED Disposition    ED Disposition Condition Comment   Admit  Hospital Area: [facility] [[id number]]  Level of Care: Med-Surg [16]  Diagnosis: Back pain [[id number]]  Admitting Physician: Eduard Clos [phone]  Attending Physician: Eduard Clos [[id number]]  PT Class (Do Not Modify): Observation [104]  PT Acc Code (Do Not Modify): Observation [10022]       Medical History Past Medical History:  Diagnosis Date  . Arthritis    knee and hip OA- prev injection by Dr. Ethelene Hal  . Blood transfusion without reported diagnosis    37 yrs ago -s/p back surgery  . Chest pain   . Coronary artery disease    50% mid LAD by cath January 2016  . Depression 1993, 2000  . Fibromyalgia   . GERD (gastroesophageal reflux disease)   . H/O hiatal hernia   . Hyperlipidemia   . Hypertension   . OSA (obstructive sleep apnea)    dx 2010-had a cpap-does not use-"uses special mouthpiece now"  . Osteoporosis   . Positive cardiac stress test   . Stroke (HCC)   . Urinary incontinence   . UTI (lower urinary tract infection)     Allergies Allergies  Allergen Reactions  . Digoxin Other (See Comments)    Felt weak, tired, "aweful"  . Nitrofurantoin Other  (See Comments)    Sharp pain back of head  . Alendronate Sodium Other (See Comments)    cramps  . Simvastatin Other (See Comments)    Myalgia, possible intolerance  . Sulfasalazine Other (See Comments)    REACTION: (Maybe)-neck pain-brief  . Sulfonamide Derivatives Other (See Comments)    REACTION: (Maybe)-neck pain-brief  . Alendronate Other (See Comments)  . Alendronate Sodium   . Chlordiazepoxide Hcl Other (See Comments)  . Macrobid  [Nitrofurantoin Macrocrystal]   . Pb-Hyoscy-Atropine-Scopolamine Other (See Comments)  . Pravastatin Other (See Comments)    Myalgias  . Augmentin [Amoxicillin-Pot Clavulanate] Rash    In mouth  . Cefaclor Other (See Comments)    Felt bad  . Cephalexin Other (See Comments)    Felt bad, but able to tolerate amoxil prev  . Chlordiazepoxide Other (See Comments)    REACTION: (Maybe)  . Clavulanic Acid Other (See Comments)    Not an allergy.  Tolerates plain amoxil but not augmentin- diarrhea, etc with augmentin  . Omeprazole Rash    rash  . Oxcarbazepine Other (See Comments)    unknown  . Pb-Hyoscy-Atropine-Scopolamine Other (See Comments)    Unsure-doesn't remember  . Streptomycin Other (See Comments)    Lips itched  . Sulfa Antibiotics Other (See Comments)    Unsure-doesn't remember  . Tetracycline Itching  itching  . Zetia [Ezetimibe] Other (See Comments)    possible cause of aches and abnormal dreams, stopped 05/2014    IV Location/Drains/Wounds Patient Lines/Drains/Airways Status   Active Line/Drains/Airways    Name:   Placement date:   Placement time:   Site:   Days:   Peripheral IV 09/03/18 Left Antecubital   09/03/18    1847    Antecubital   1          Labs/Imaging Results for orders placed or performed during the hospital encounter of 09/03/18 (from the past 48 hour(s))  CBC with Differential     Status: Abnormal   Collection Time: 09/03/18  6:44 PM  Result Value Ref Range   WBC 8.4 4.0 - 10.5 K/uL   RBC 4.39 3.87 -  5.11 MIL/uL   Hemoglobin 14.5 12.0 - 15.0 g/dL   HCT 82.944.3 56.236.0 - 13.046.0 %   MCV 100.9 (H) 80.0 - 100.0 fL   MCH 33.0 26.0 - 34.0 pg   MCHC 32.7 30.0 - 36.0 g/dL   RDW 86.513.2 78.411.5 - 69.615.5 %   Platelets 242 150 - 400 K/uL   nRBC 0.0 0.0 - 0.2 %   Neutrophils Relative % 63 %   Neutro Abs 5.3 1.7 - 7.7 K/uL   Lymphocytes Relative 27 %   Lymphs Abs 2.3 0.7 - 4.0 K/uL   Monocytes Relative 6 %   Monocytes Absolute 0.5 0.1 - 1.0 K/uL   Eosinophils Relative 2 %   Eosinophils Absolute 0.2 0.0 - 0.5 K/uL   Basophils Relative 1 %   Basophils Absolute 0.1 0.0 - 0.1 K/uL   Immature Granulocytes 1 %   Abs Immature Granulocytes 0.04 0.00 - 0.07 K/uL    Comment: Performed at [facility], [address] [place], [place] [address]  Comprehensive metabolic panel     Status: Abnormal   Collection Time: 09/03/18  6:44 PM  Result Value Ref Range   Sodium 141 135 - 145 mmol/L   Potassium 3.6 3.5 - 5.1 mmol/L   Chloride 104 98 - 111 mmol/L   CO2 27 22 - 32 mmol/L   Glucose, Bld 101 (H) 70 - 99 mg/dL   BUN 11 8 - 23 mg/dL   Creatinine, Ser 4.130.79 0.44 - 1.00 mg/dL   Calcium 9.3 8.9 - 24.410.3 mg/dL   Total Protein 7.3 6.5 - 8.1 g/dL   Albumin 4.3 3.5 - 5.0 g/dL   AST 24 15 - 41 U/L   ALT 18 0 - 44 U/L   Alkaline Phosphatase 68 38 - 126 U/L   Total Bilirubin 0.6 0.3 - 1.2 mg/dL   GFR calc non Af Amer >60 >60 mL/min   GFR calc Af Amer >60 >60 mL/min   Anion gap 10 5 - 15    Comment: Performed at [facility], [address] [place], [place] [address]  Lipase, blood     Status: None   Collection Time: 09/03/18  6:44 PM  Result Value Ref Range   Lipase 29 11 - 51 U/L    Comment: Performed at [facility], [address] [place], [place] [address]  Urinalysis, Routine w reflex microscopic     Status: Abnormal   Collection Time: 09/03/18  6:44 PM  Result Value Ref Range   Color, Urine STRAW (A) YELLOW   APPearance CLEAR CLEAR   Specific Gravity,  Urine 1.006 1.005 - 1.030   pH 6.0 5.0 - 8.0  Glucose, UA NEGATIVE NEGATIVE mg/dL   Hgb urine dipstick NEGATIVE NEGATIVE   Bilirubin Urine NEGATIVE NEGATIVE   Ketones, ur NEGATIVE NEGATIVE mg/dL   Protein, ur NEGATIVE NEGATIVE mg/dL   Nitrite NEGATIVE NEGATIVE   Leukocytes, UA NEGATIVE NEGATIVE    Comment: Performed at [facility], [address], [place], [place] [address]   Mr Lumbar Spine Wo Contrast  Result Date: 09/03/2018 CLINICAL DATA:  85 year old female with back pain radiating to the right hip for 1 month. EXAM: MRI LUMBAR SPINE WITHOUT CONTRAST TECHNIQUE: Multiplanar, multisequence MR imaging of the lumbar spine was performed. No intravenous contrast was administered. COMPARISON:  CT Abdomen and Pelvis earlier today. Lumbar radiographs 06/06/2015. FINDINGS: Segmentation:  Normal on the comparison CT. Alignment: Mild levoconvex lumbar scoliosis has progressed since 2016. Chronic anterolisthesis of L5 on S1 appears stable and is grade 2 (11 millimeters). Mild retrolisthesis of L2 on L3 is stable. Vertebrae: Chronic bilateral L5 pars fractures better demonstrated by CT today. Chronic postoperative changes to the posterior elements of L4 and L5. No marrow edema or evidence of acute osseous abnormality. Intact visible sacrum and SI joints. Conus medullaris and cauda equina: Conus extends to the L1 level. No lower spinal cord or conus signal abnormality. Paraspinal and other soft tissues: Stable from the CT earlier today. Disc levels: No lower thoracic spinal stenosis. T12-L1: Posterior and right eccentric disc bulge. Mild right T12 foraminal stenosis. L1-L2: Circumferential disc bulge and endplate spurring with mild facet hypertrophy. Mild left and moderate right L1 foraminal stenosis. L2-L3: Disc space loss with circumferential disc osteophyte complex eccentric to the left. Mild to moderate facet and ligament flavum hypertrophy. Mild spinal stenosis. Moderate to severe  bilateral L2 foraminal stenosis. L3-L4: Disc space loss with left eccentric circumferential disc osteophyte complex. Moderate to severe facet and ligament flavum hypertrophy. Trace degenerative facet joint fluid on the left and small superimposed 6 millimeter degenerative synovial cyst projecting into the spinal canal from the posterior left of midline (series 6, image 22 and series 5, image 8). Moderate to severe spinal and left greater than right lateral recess stenosis (L4 nerve levels). Severe left and mild to moderate right L3 foraminal stenosis. L4-L5: Postoperative changes to the posterior elements which are ankylosed bilaterally. Negative disc and no stenosis. L5-S1: Grade 2 anterolisthesis with disc space loss and circumferential disc/pseudo disc. Chronic pars fractures and bilateral posterior element hypertrophy plus previous posterior decompression. No definite ankylosis at this level. No spinal or lateral recess stenosis. Moderate to severe left and severe right L5 foraminal stenosis (series 4, image 4 on the right). IMPRESSION: 1. Previous posterior decompression of L4-L5 and L5-S1. - solid posterior element ankylosis at L4-L5 without stenosis. - at L5-S1 there are chronic pars fractures with grade 2 spondylolisthesis, and multifactorial moderate to severe right greater than left L5 foraminal stenosis. 2. Adjacent segment disease at L3-L4 with advanced disc and posterior element degeneration. Multifactorial moderate to severe spinal, left greater than right lateral recess stenosis, and left greater than right neural foraminal stenosis. 3. L2-L3 mild spinal but moderate to severe bilateral foraminal stenosis. Electronically Signed   By: Odessa Fleming M.D.   On: 09/03/2018 22:40   Ct Abdomen Pelvis W Contrast  Result Date: 09/03/2018 CLINICAL DATA:  Back and right hip pain for the past month. EXAM: CT ABDOMEN AND PELVIS WITH CONTRAST TECHNIQUE: Multidetector CT imaging of the abdomen and pelvis was  performed using the standard protocol following bolus administration of intravenous contrast. CONTRAST:   ISOVUE-300 IOPAMIDOL (ISOVUE-300) INJECTION 61% COMPARISON:  Lumbar spine radiographs dated 06/06/2015 FINDINGS: Lower chest: Dense atheromatous coronary artery calcifications. Mild cylindrical bronchiectasis and scarring in the medial aspect of the right lower lobe. Hepatobiliary: Small gallstones in the dependent portion of the gallbladder. The largest measures 6 mm. No gallbladder wall thickening or pericholecystic fluid. Small cysts in the dome of the liver on the right. Pancreas: Unremarkable. No pancreatic ductal dilatation or surrounding inflammatory changes. Spleen: Normal in size without focal abnormality. Adrenals/Urinary Tract: Normal appearing adrenal glands. Small bilateral renal cysts. Unremarkable ureters and urinary bladder. Stomach/Bowel: Multiple sigmoid colon diverticula without evidence of diverticulitis. Surgically absent appendix. Unremarkable stomach and small bowel. Vascular/Lymphatic: Atheromatous arterial calcifications without aneurysm. No enlarged lymph nodes. Reproductive: Status post hysterectomy. No adnexal masses. Other: Small umbilical hernia containing fat. Musculoskeletal: Lumbar and lower thoracic spine degenerative changes. Bilateral L5 pars interarticularis defects with associated grade 2 anterolisthesis at the L5-S1 level. Fusion of the posterior elements at the L4-5 level. Facet degenerative changes in the mid lumbar spine with grade 1 anterolisthesis at the L3-4 level and grade 1 retrolisthesis at the L2-3 level. IMPRESSION: 1. No acute abnormality. 2. Cholelithiasis. 3. Sigmoid diverticulosis. 4. Dense atheromatous coronary artery calcifications and calcific aortic atherosclerosis. 5. Bilateral L5 spondylolysis with associated grade 2 spondylolisthesis at the L5-S1 level. Electronically Signed   By: Beckie Salts M.D.   On: 09/03/2018 20:33   None  Pending  Labs Unresulted Labs (From admission, onward)   None      Vitals/Pain Today's Vitals   09/03/18 1956 09/03/18 1959 09/03/18 2029 09/03/18 2247  BP:  (!) 170/86  128/71  Pulse:  80  73  Resp:  18  18  Temp:  97.8 F (36.6 C)  97.8 F (36.6 C)  TempSrc:  Oral  Oral  SpO2:  92%  93%  PainSc: 7  7  5  4      Isolation Precautions No active isolations  Medications Medications  iopamidol (ISOVUE-300) 61 % injection (has no administration in time range)  sodium chloride (PF) 0.9 % injection (has no administration in time range)  fentaNYL (SUBLIMAZE) injection 25 mcg (has no administration in time range)  fentaNYL (SUBLIMAZE) injection 25 mcg (25 mcg Intravenous Given 09/03/18 1845)  iopamidol (ISOVUE-300) 61 % injection 100 mL (100 mLs Intravenous Contrast Given 09/03/18 1954)  oxyCODONE-acetaminophen (PERCOCET/ROXICET) 5-325 MG per tablet 1 tablet (1 tablet Oral Given 09/03/18 2107)  ondansetron (ZOFRAN) injection 4 mg (4 mg Intravenous Given 09/03/18 2107)  predniSONE (DELTASONE) tablet 40 mg (40 mg Oral Given 09/03/18 2256)    Mobility Walks  But but today hurts to much

## 2018-09-04 NOTE — Progress Notes (Signed)
Patient seen  and examined. HPI reviewed.  Patient report pain on ambulation.   Back pain; Related to spinal stenosis;  On prednisone.  PT consulted. Hopefully home in 24 hours.   Robin Juarez

## 2018-09-04 NOTE — H&P (Signed)
History and Physical    Robin Juarez XBM:[id number] DOB: [birth year] DOA: 09/03/2018  PCP: Joaquim Namuncan, Graham S, MD  Patient coming from: Home.  Chief Complaint: Low back pain.  HPI: Robin Juarez is a 85 y.o. female with history of hypertension, hyperlipidemia, depression presents to the ER because of acute worsening of the low back pain over the last 3 days.  Patient states he has been having low back pain for last few days which is acutely worsened over the last 3 to 4 days.  Denies any fall or incontinence of urine.  Denies any loss of function of the lower extremities.  ED Course: In the ER MRI of the L-spine shows severe neural foraminal stenosis at L3 on 4.  On-call neurosurgeon was consulted.  Neurosurgeon advised patient to be kept on Decadron and outpatient follow-up with them.  Since patient having significant pain patient has been admitted for pain relief.  Review of Systems: As per HPI, rest all negative.   Past Medical History:  Diagnosis Date  . Arthritis    knee and hip OA- prev injection by Dr. Ethelene Halamos  . Blood transfusion without reported diagnosis    37 yrs ago -s/p back surgery  . Chest pain   . Coronary artery disease    50% mid LAD by cath January 2016  . Depression 1993, 2000  . Fibromyalgia   . GERD (gastroesophageal reflux disease)   . H/O hiatal hernia   . Hyperlipidemia   . Hypertension   . OSA (obstructive sleep apnea)    dx 2010-had a cpap-does not use-"uses special mouthpiece now"  . Osteoporosis   . Positive cardiac stress test   . Stroke (HCC)   . Urinary incontinence   . UTI (lower urinary tract infection)     Past Surgical History:  Procedure Laterality Date  . ABDOMINAL HYSTERECTOMY  1976  . APPENDECTOMY  1964  . Back fusions  1977   missing vertebra  . BACK SURGERY     fusions lumbar area  . BREAST BIOPSY  1995   x 2  . BREAST BIOPSY  07/10/2012   Procedure: BREAST BIOPSY WITH NEEDLE LOCALIZATION;  Surgeon: Emelia LoronMatthew  Wakefield, MD;  Location: [facility];  Service: General;  Laterality: Right;  . CARDIOVASCULAR STRESS TEST  2009   Normal, Dr. Mayford Knifeurner  . CATARACT EXTRACTION, BILATERAL Bilateral 2005  . Childbirth  [phone],1957,1960  . CYSTOSCOPY N/A 11/25/2012   Procedure: CYSTOSCOPY;  Surgeon: Martina SinnerScott A MacDiarmid, MD;  [facility] [place];  Service: Urology;  Laterality: N/A;  . dilatation and currettage  1964  . LEFT HEART CATH AND CORONARY ANGIOGRAPHY N/A 06/15/2017   Procedure: LEFT HEART CATH AND CORONARY ANGIOGRAPHY;  Surgeon: Lennette BihariKelly, Thomas A, MD;  Location: [facility];  Service: Cardiovascular;  Laterality: N/A;  . LEFT HEART CATHETERIZATION WITH CORONARY ANGIOGRAM N/A 09/03/2014   Procedure: LEFT HEART CATHETERIZATION WITH CORONARY ANGIOGRAM;  Surgeon: Runell GessJonathan J Berry, MD;  [facility];  Service: Cardiovascular;  Laterality: N/A;  . LUMBAR LAMINECTOMY/DECOMPRESSION MICRODISCECTOMY N/A 05/01/2013   Procedure: COMPLETE DECOMPRESSIVE LUMBAR LAMINECTOMY L3-L4 MICRODISCECTOMY L3-L4 ON THE LEFT;  Surgeon: Jacki Conesonald A Gioffre, MD;  Location: [facility];  Service: Orthopedics;  Laterality: N/A;  . OVARIAN CYST REMOVAL  1964  . PUBOVAGINAL SLING N/A 11/25/2012   Procedure: Leonides GrillsPUBO-VAGINAL SLING;  Surgeon: Martina SinnerScott A MacDiarmid, MD;  [facility];  Service: Urology;  Laterality: N/A;     reports that she  has never smoked. She has never used smokeless tobacco. She reports that she does not drink alcohol or use drugs.  Allergies  Allergen Reactions  . Digoxin Other (See Comments)    Felt weak, tired, "aweful"  . Nitrofurantoin Other (See Comments)    Sharp pain back of head  . Alendronate Sodium Other (See Comments)    cramps  . Simvastatin Other (See Comments)    Myalgia, possible intolerance  . Sulfasalazine Other (See Comments)    REACTION: (Maybe)-neck pain-brief  . Sulfonamide Derivatives Other (See Comments)    REACTION: (Maybe)-neck  pain-brief  . Alendronate Other (See Comments)  . Alendronate Sodium   . Chlordiazepoxide Hcl Other (See Comments)  . Macrobid  [Nitrofurantoin Macrocrystal]   . Pb-Hyoscy-Atropine-Scopolamine Other (See Comments)  . Pravastatin Other (See Comments)    Myalgias  . Augmentin [Amoxicillin-Pot Clavulanate] Rash    In mouth  . Cefaclor Other (See Comments)    Felt bad  . Cephalexin Other (See Comments)    Felt bad, but able to tolerate amoxil prev  . Chlordiazepoxide Other (See Comments)    REACTION: (Maybe)  . Clavulanic Acid Other (See Comments)    Not an allergy.  Tolerates plain amoxil but not augmentin- diarrhea, etc with augmentin  . Omeprazole Rash    rash  . Oxcarbazepine Other (See Comments)    unknown  . Pb-Hyoscy-Atropine-Scopolamine Other (See Comments)    Unsure-doesn't remember  . Streptomycin Other (See Comments)    Lips itched  . Sulfa Antibiotics Other (See Comments)    Unsure-doesn't remember  . Tetracycline Itching    itching  . Zetia [Ezetimibe] Other (See Comments)    possible cause of aches and abnormal dreams, stopped 05/2014    Family History  Problem Relation Age of Onset  . Arthritis Mother   . Stroke Father   . Hypertension Father   . Heart disease Brother        Died during CABG  . Hypertension Sister   . Dementia Sister   . Aneurysm Sister        Brain  . Parkinsonism Sister   . Kidney disease Maternal Grandmother   . Colon cancer Neg Hx   . Breast cancer Neg Hx     Prior to Admission medications   Medication Sig Start Date End Date Taking? Authorizing Provider  Alirocumab (PRALUENT) 75 MG/ML SOAJ Inject 2 pens into the skin every 14 (fourteen) days. 09/02/18  Yes Runell Gess, MD  cholecalciferol (VITAMIN D) 1000 UNITS tablet Take 1,000 Units by mouth daily.     Yes [provider]  clopidogrel (PLAVIX) 75 MG tablet TAKE 1 TABLET (75 MG TOTAL) BY MOUTH DAILY. Patient taking differently: Take 75 mg by mouth daily.  03/27/18   Yes Joaquim Nam, MD  Coenzyme Q10 (CO Q 10 PO) Take 1 tablet by mouth daily.   Yes [provider]  diclofenac sodium (VOLTAREN) 1 % GEL Apply 2 g topically 4 (four) times daily as needed (for joint aches). 07/23/17  Yes Joaquim Nam, MD  DULoxetine (CYMBALTA) 30 MG capsule Take 90 mg by mouth daily.    Yes [provider]  folic acid (FOLVITE) 800 MCG tablet Take 800 mcg by mouth daily.    Yes [provider]  gabapentin (NEURONTIN) 300 MG capsule Take 300-600 mg by mouth See admin instructions. 300 mg at lunch and supper and 600 mg in the evening   Yes [provider]  hydrocortisone 2.5 %  ointment Apply 1 application topically daily as needed (rash).    Yes [provider]  KRILL OIL PO Take 100 mg by mouth daily.   Yes [provider]  lamoTRIgine (LAMICTAL) 100 MG tablet Take 100 mg by mouth at bedtime.   Yes [provider]  lisinopril (PRINIVIL,ZESTRIL) 2.5 MG tablet TAKE 1 TABLET BY MOUTH EVERY DAY Patient taking differently: Take 2.5 mg by mouth at bedtime.  06/10/18  Yes Runell Gess, MD  metoprolol succinate (TOPROL-XL) 25 MG 24 hr tablet TAKE 1 TABLET (25 MG TOTAL) BY MOUTH DAILY. Patient taking differently: Take 25 mg by mouth daily after supper.  08/08/18  Yes Runell Gess, MD  OLANZapine (ZYPREXA) 2.5 MG tablet Take 1 tablet (2.5 mg total) by mouth at bedtime. 08/05/15  Yes Joaquim Nam, MD  docusate sodium (COLACE) 100 MG capsule Take 1 capsule (100 mg total) by mouth daily for 10 days. 09/03/18 09/13/18  Shaune Pollack, MD  oxyCODONE (ROXICODONE) 5 MG immediate release tablet Take 0.5-1 tablets (2.5-5 mg total) by mouth every 6 (six) hours as needed for moderate pain or severe pain (0.5 tab for moderate pain, 1 tab for severe pain). 09/03/18   Shaune Pollack, MD  predniSONE (DELTASONE) 10 MG tablet Take 2 tablets (20 mg total) by mouth daily for 3 days, THEN 1 tablet (10 mg total) daily for 3 days,  THEN 0.5 tablets (5 mg total) daily for 3 days. 09/03/18 09/12/18  Shaune Pollack, MD    Physical Exam: Vitals:   09/03/18 1755 09/03/18 1930 09/03/18 1959 09/03/18 2247  BP: (!) 156/68 (!) 170/86 (!) 170/86 128/71  Pulse: 75 80 80 73  Resp: 18  18 18   Temp: 97.8 F (36.6 C)  97.8 F (36.6 C) 97.8 F (36.6 C)  TempSrc: Oral  Oral Oral  SpO2: 98% 92% 92% 93%      Constitutional: Moderately built and nourished. Vitals:   09/03/18 1755 09/03/18 1930 09/03/18 1959 09/03/18 2247  BP: (!) 156/68 (!) 170/86 (!) 170/86 128/71  Pulse: 75 80 80 73  Resp: 18  18 18   Temp: 97.8 F (36.6 C)  97.8 F (36.6 C) 97.8 F (36.6 C)  TempSrc: Oral  Oral Oral  SpO2: 98% 92% 92% 93%   Eyes: Anicteric no pallor. ENMT: No discharge from the ears eyes nose or mouth. Neck: No mass felt.  No neck rigidity. Respiratory: No rhonchi or crepitations. Cardiovascular: S1-S2 heard. Abdomen: Soft nontender bowel sounds present. Musculoskeletal: No edema.  No joint effusion. Skin: No rash. Neurologic: Alert awake oriented to time place and person.  Moves all extremities. Psychiatric: Appears normal.   Labs on Admission: I have personally reviewed following labs and imaging studies  CBC: Recent Labs  Lab 09/03/18 1844  WBC 8.4  NEUTROABS 5.3  HGB 14.5  HCT 44.3  MCV 100.9*  PLT 242   Basic Metabolic Panel: Recent Labs  Lab 09/03/18 1844  NA 141  K 3.6  CL 104  CO2 27  GLUCOSE 101*  BUN 11  CREATININE 0.79  CALCIUM 9.3   GFR: CrCl cannot be calculated (Unknown ideal weight.). Liver Function Tests: Recent Labs  Lab 09/03/18 1844  AST 24  ALT 18  ALKPHOS 68  BILITOT 0.6  PROT 7.3  ALBUMIN 4.3   Recent Labs  Lab 09/03/18 1844  LIPASE 29   No results for input(s): AMMONIA in the last 168 hours. Coagulation Profile: No results for input(s): INR, PROTIME in the last 168  hours. Cardiac Enzymes: No results for input(s): CKTOTAL, CKMB, CKMBINDEX, TROPONINI in the last 168  hours. BNP (last 3 results) No results for input(s): PROBNP in the last 8760 hours. HbA1C: No results for input(s): HGBA1C in the last 72 hours. CBG: No results for input(s): GLUCAP in the last 168 hours. Lipid Profile: No results for input(s): CHOL, HDL, LDLCALC, TRIG, CHOLHDL, LDLDIRECT in the last 72 hours. Thyroid Function Tests: No results for input(s): TSH, T4TOTAL, FREET4, T3FREE, THYROIDAB in the last 72 hours. Anemia Panel: No results for input(s): VITAMINB12, FOLATE, FERRITIN, TIBC, IRON, RETICCTPCT in the last 72 hours. Urine analysis:    Component Value Date/Time   COLORURINE STRAW (A) 09/03/2018 1844   APPEARANCEUR CLEAR 09/03/2018 1844   LABSPEC 1.006 09/03/2018 1844   PHURINE 6.0 09/03/2018 1844   GLUCOSEU NEGATIVE 09/03/2018 1844   HGBUR NEGATIVE 09/03/2018 1844   BILIRUBINUR NEGATIVE 09/03/2018 1844   BILIRUBINUR Negative 01/11/2018 1124   KETONESUR NEGATIVE 09/03/2018 1844   PROTEINUR NEGATIVE 09/03/2018 1844   UROBILINOGEN 0.2 01/11/2018 1124   UROBILINOGEN 0.2 08/12/2014 2227   NITRITE NEGATIVE 09/03/2018 1844   LEUKOCYTESUR NEGATIVE 09/03/2018 1844   Sepsis Labs: @LABRCNTIP (procalcitonin:4,lacticidven:4) )No results found for this or any previous visit (from the past 240 hour(s)).   Radiological Exams on Admission: Mr Lumbar Spine Wo Contrast  Result Date: 09/03/2018 CLINICAL DATA:  85 year old female with back pain radiating to the right hip for 1 month. EXAM: MRI LUMBAR SPINE WITHOUT CONTRAST TECHNIQUE: Multiplanar, multisequence MR imaging of the lumbar spine was performed. No intravenous contrast was administered. COMPARISON:  CT Abdomen and Pelvis earlier today. Lumbar radiographs 06/06/2015. FINDINGS: Segmentation:  Normal on the comparison CT. Alignment: Mild levoconvex lumbar scoliosis has progressed since 2016. Chronic anterolisthesis of L5 on S1 appears stable and is grade 2 (11 millimeters). Mild retrolisthesis of L2 on L3 is stable. Vertebrae:  Chronic bilateral L5 pars fractures better demonstrated by CT today. Chronic postoperative changes to the posterior elements of L4 and L5. No marrow edema or evidence of acute osseous abnormality. Intact visible sacrum and SI joints. Conus medullaris and cauda equina: Conus extends to the L1 level. No lower spinal cord or conus signal abnormality. Paraspinal and other soft tissues: Stable from the CT earlier today. Disc levels: No lower thoracic spinal stenosis. T12-L1: Posterior and right eccentric disc bulge. Mild right T12 foraminal stenosis. L1-L2: Circumferential disc bulge and endplate spurring with mild facet hypertrophy. Mild left and moderate right L1 foraminal stenosis. L2-L3: Disc space loss with circumferential disc osteophyte complex eccentric to the left. Mild to moderate facet and ligament flavum hypertrophy. Mild spinal stenosis. Moderate to severe bilateral L2 foraminal stenosis. L3-L4: Disc space loss with left eccentric circumferential disc osteophyte complex. Moderate to severe facet and ligament flavum hypertrophy. Trace degenerative facet joint fluid on the left and small superimposed 6 millimeter degenerative synovial cyst projecting into the spinal canal from the posterior left of midline (series 6, image 22 and series 5, image 8). Moderate to severe spinal and left greater than right lateral recess stenosis (L4 nerve levels). Severe left and mild to moderate right L3 foraminal stenosis. L4-L5: Postoperative changes to the posterior elements which are ankylosed bilaterally. Negative disc and no stenosis. L5-S1: Grade 2 anterolisthesis with disc space loss and circumferential disc/pseudo disc. Chronic pars fractures and bilateral posterior element hypertrophy plus previous posterior decompression. No definite ankylosis at this level. No spinal or lateral recess stenosis. Moderate to severe left and severe right L5 foraminal stenosis (series 4,  image 4 on the right). IMPRESSION: 1. Previous  posterior decompression of L4-L5 and L5-S1. - solid posterior element ankylosis at L4-L5 without stenosis. - at L5-S1 there are chronic pars fractures with grade 2 spondylolisthesis, and multifactorial moderate to severe right greater than left L5 foraminal stenosis. 2. Adjacent segment disease at L3-L4 with advanced disc and posterior element degeneration. Multifactorial moderate to severe spinal, left greater than right lateral recess stenosis, and left greater than right neural foraminal stenosis. 3. L2-L3 mild spinal but moderate to severe bilateral foraminal stenosis. Electronically Signed   By: Odessa FlemingH  Hall M.D.   On: 09/03/2018 22:40   Ct Abdomen Pelvis W Contrast  Result Date: 09/03/2018 CLINICAL DATA:  Back and right hip pain for the past month. EXAM: CT ABDOMEN AND PELVIS WITH CONTRAST TECHNIQUE: Multidetector CT imaging of the abdomen and pelvis was performed using the standard protocol following bolus administration of intravenous contrast. CONTRAST:  100mL ISOVUE-300 IOPAMIDOL (ISOVUE-300) INJECTION 61% COMPARISON:  Lumbar spine radiographs dated 06/06/2015 FINDINGS: Lower chest: Dense atheromatous coronary artery calcifications. Mild cylindrical bronchiectasis and scarring in the medial aspect of the right lower lobe. Hepatobiliary: Small gallstones in the dependent portion of the gallbladder. The largest measures 6 mm. No gallbladder wall thickening or pericholecystic fluid. Small cysts in the dome of the liver on the right. Pancreas: Unremarkable. No pancreatic ductal dilatation or surrounding inflammatory changes. Spleen: Normal in size without focal abnormality. Adrenals/Urinary Tract: Normal appearing adrenal glands. Small bilateral renal cysts. Unremarkable ureters and urinary bladder. Stomach/Bowel: Multiple sigmoid colon diverticula without evidence of diverticulitis. Surgically absent appendix. Unremarkable stomach and small bowel. Vascular/Lymphatic: Atheromatous arterial calcifications  without aneurysm. No enlarged lymph nodes. Reproductive: Status post hysterectomy. No adnexal masses. Other: Small umbilical hernia containing fat. Musculoskeletal: Lumbar and lower thoracic spine degenerative changes. Bilateral L5 pars interarticularis defects with associated grade 2 anterolisthesis at the L5-S1 level. Fusion of the posterior elements at the L4-5 level. Facet degenerative changes in the mid lumbar spine with grade 1 anterolisthesis at the L3-4 level and grade 1 retrolisthesis at the L2-3 level. IMPRESSION: 1. No acute abnormality. 2. Cholelithiasis. 3. Sigmoid diverticulosis. 4. Dense atheromatous coronary artery calcifications and calcific aortic atherosclerosis. 5. Bilateral L5 spondylolysis with associated grade 2 spondylolisthesis at the L5-S1 level. Electronically Signed   By: Beckie SaltsSteven  Reid M.D.   On: 09/03/2018 20:33      Assessment/Plan Principal Problem:   Low back pain Active Problems:   OSA (obstructive sleep apnea)   Spinal stenosis, lumbar region, with neurogenic claudication   Essential hypertension    1. Low back pain related MRI showing neural foraminal stenosis L3-L4 -patient has been placed on pain relief medication and Decadron.  Neurosurgery recommended outpatient follow-up with them.  Consult physical therapy. 2. Hypertension on amlodipine and metoprolol. 3. Hyperlipidemia on Praluent and Zetia. 4. Depression on Zyprexa and Zoloft.   DVT prophylaxis: SCDs in anticipation of procedure. Code Status: Full code. Family Communication: Discussed with patient. Disposition Plan: Home. Consults called: Physical therapy. Admission status: Observation.   Eduard ClosArshad N Hans Rusher MD [facility] Pager [phone].  If 7PM-7AM, please contact night-coverage [email or web address] Password [email or web address]  09/04/2018, 12:29 AM

## 2018-09-05 DIAGNOSIS — M545 Low back pain: Secondary | ICD-10-CM | POA: Diagnosis not present

## 2018-09-05 MED ORDER — POLYETHYLENE GLYCOL 3350 17 G PO PACK: 17.0000 g | PACK | Freq: Every day | ORAL | 0 refills | Status: DC

## 2018-09-05 MED ORDER — DOCUSATE SODIUM 100 MG PO CAPS: 100.0000 mg | ORAL_CAPSULE | Freq: Every day | ORAL | 0 refills | Status: AC

## 2018-09-05 MED ORDER — BACLOFEN 5 MG PO TABS: 5.0000 mg | ORAL_TABLET | Freq: Three times a day (TID) | ORAL | 0 refills | Status: DC | PRN

## 2018-09-05 MED ORDER — OXYCODONE HCL 5 MG PO TABS: 2.5000 mg | ORAL_TABLET | Freq: Four times a day (QID) | ORAL | 0 refills | Status: DC | PRN

## 2018-09-05 MED ORDER — PREDNISONE 20 MG PO TABS: 40.0000 mg | ORAL_TABLET | Freq: Every day | ORAL | 0 refills | Status: AC

## 2018-09-05 NOTE — Care Management Note (Signed)
Case Management Note  Patient Details  Name: DEMII LEDER MRN: [id number] Date of Birth: [birth year]  Subjective/Objective: Low back pain. From home. PT no f/u. Needs yrw-[facility] rep Clydie Braun aware to deliver to rm prior d/c.No further CM needs.                   Action/Plan:d/c home/dme   Expected Discharge Date:  09/05/18               Expected Discharge Plan:  Home/Self Care  In-House Referral:     Discharge planning Services  CM Consult  Post Acute Care Choice:    Choice offered to:     DME Arranged:  [facility] DME Agency:  [facility]  HH Arranged:    [facility] Agency:     Status of Service:  Completed, signed off  If discussed at [employer] of Stay Meetings, dates discussed:    Additional Comments:  Lanier Clam, RN 09/05/2018, 10:56 AM

## 2018-09-05 NOTE — Discharge Summary (Signed)
Physician Discharge Summary  Robin Juarez QQP:[id number] DOB: May 18, 1931 DOA: 09/03/2018  PCP: Joaquim Nam, MD  Admit date: 09/03/2018 Discharge date: 09/05/2018  Admitted From: Home  Disposition: Home   Recommendations for Outpatient Follow-up:  1. Follow up with PCP in 1-2 weeks 2. Needs close follow up with neurosurgery,  3. She was discharge on prednisone for 5 days.   Home Health; none  Discharge Condition:stable.  CODE STATUS: full code.  Diet recommendation: Heart Healthy   Brief/Interim Summary: Robin Juarez is a 85 y.o. female with history of hypertension, hyperlipidemia, depression presents to the ER because of acute worsening of the low back pain over the last 3 days.  Patient states he has been having low back pain for last few days which is acutely worsened over the last 3 to 4 days.  Denies any fall or incontinence of urine.  Denies any loss of function of the lower extremities.  ED Course: In the ER MRI of the L-spine shows severe neural foraminal stenosis at L3 on 4.  On-call neurosurgeon was consulted.  Neurosurgeon advised patient to be kept on Decadron and outpatient follow-up with them.  Since patient having significant pain patient has been admitted for pain relief.   Back pain, L 3-4-5 spinal stenosis, moderate to severe, chronic pars fracture; Case was discussed with neurosurgery who recommend steroids. Pain management and referral to out patient neurosurgery.  Patient improved on prednisone, oxycodone and baclofen.   For her other chronic medical problems continue with current medications.   Discharge Diagnoses:  Principal Problem:   Low back pain Active Problems:   OSA (obstructive sleep apnea)   Spinal stenosis, lumbar region, with neurogenic claudication   Essential hypertension    Discharge Instructions  Discharge Instructions    Diet - low sodium heart healthy   Complete by:  As directed    Increase activity slowly    Complete by:  As directed      Allergies as of 09/05/2018      Reactions   Digoxin Other (See Comments)   Felt weak, tired, "aweful"   Nitrofurantoin Other (See Comments)   Sharp pain back of head   Alendronate Sodium Other (See Comments)   cramps   Simvastatin Other (See Comments)   Myalgia, possible intolerance   Sulfasalazine Other (See Comments)   REACTION: (Maybe)-neck pain-brief   Sulfonamide Derivatives Other (See Comments)   REACTION: (Maybe)-neck pain-brief   Alendronate Other (See Comments)   Alendronate Sodium    Chlordiazepoxide Hcl Other (See Comments)   Macrobid  [nitrofurantoin Macrocrystal]    Pb-hyoscy-atropine-scopolamine Other (See Comments)   Pravastatin Other (See Comments)   Myalgias   Augmentin [amoxicillin-pot Clavulanate] Rash   In mouth   Cefaclor Other (See Comments)   Felt bad   Cephalexin Other (See Comments)   Felt bad, but able to tolerate amoxil prev   Chlordiazepoxide Other (See Comments)   REACTION: (Maybe)   Clavulanic Acid Other (See Comments)   Not an allergy.  Tolerates plain amoxil but not augmentin- diarrhea, etc with augmentin   Omeprazole Rash   rash   Oxcarbazepine Other (See Comments)   unknown   Pb-hyoscy-atropine-scopolamine Other (See Comments)   Unsure-doesn't remember   Streptomycin Other (See Comments)   Lips itched   Sulfa Antibiotics Other (See Comments)   Unsure-doesn't remember   Tetracycline Itching   itching   Zetia [ezetimibe] Other (See Comments)   possible cause of aches and abnormal dreams, stopped 05/2014  Medication List    TAKE these medications   Alirocumab 75 MG/ML Soaj Commonly known as:  PRALUENT Inject 2 pens into the skin every 14 (fourteen) days.   Baclofen 5 MG Tabs Take 5 mg by mouth 3 (three) times daily as needed for muscle spasms.   cholecalciferol 1000 units tablet Commonly known as:  VITAMIN D Take 1,000 Units by mouth daily.   clopidogrel 75 MG tablet Commonly known as:   PLAVIX TAKE 1 TABLET (75 MG TOTAL) BY MOUTH DAILY. What changed:  See the new instructions.   CO Q 10 PO Take 1 tablet by mouth daily.   diclofenac sodium 1 % Gel Commonly known as:  VOLTAREN Apply 2 g topically 4 (four) times daily as needed (for joint aches).   docusate sodium 100 MG capsule Commonly known as:  COLACE Take 1 capsule (100 mg total) by mouth daily for 10 days.   DULoxetine 30 MG capsule Commonly known as:  CYMBALTA Take 90 mg by mouth daily.   folic acid 800 MCG tablet Commonly known as:  FOLVITE Take 800 mcg by mouth daily.   gabapentin 300 MG capsule Commonly known as:  NEURONTIN Take 300-600 mg by mouth See admin instructions. 300 mg at lunch and supper and 600 mg in the evening   hydrocortisone 2.5 % ointment Apply 1 application topically daily as needed (rash).   KRILL OIL PO Take 100 mg by mouth daily.   lamoTRIgine 100 MG tablet Commonly known as:  LAMICTAL Take 100 mg by mouth at bedtime.   lisinopril 2.5 MG tablet Commonly known as:  PRINIVIL,ZESTRIL TAKE 1 TABLET BY MOUTH EVERY DAY What changed:  when to take this   metoprolol succinate 25 MG 24 hr tablet Commonly known as:  TOPROL-XL TAKE 1 TABLET (25 MG TOTAL) BY MOUTH DAILY. What changed:  See the new instructions.   OLANZapine 2.5 MG tablet Commonly known as:  ZYPREXA Take 1 tablet (2.5 mg total) by mouth at bedtime.   oxyCODONE 5 MG immediate release tablet Commonly known as:  ROXICODONE Take 0.5-1 tablets (2.5-5 mg total) by mouth every 6 (six) hours as needed for moderate pain or severe pain (0.5 tab for moderate pain, 1 tab for severe pain).   polyethylene glycol packet Commonly known as:  MIRALAX / GLYCOLAX Take 17 g by mouth daily. Start taking on:  September 06, 2018   predniSONE 20 MG tablet Commonly known as:  DELTASONE Take 2 tablets (40 mg total) by mouth daily with breakfast for 5 days. Start taking on:  September 06, 2018            Durable Medical Equipment   (From admission, onward)         Start     Ordered   09/05/18 0741  For home use only DME [facility]  Once    Question:  Patient needs a walker to treat with the following condition  Answer:  Back pain   09/05/18 0740         Follow-up Information    Joaquim Namuncan, Graham S, MD In 1 week.   Specialty:  Family Medicine Why:  As needed Contact information: [address] [place] [address] [phone]        Pa, [facility].   Specialty:  Neurosurgery Why:  I'd recommend seeing a Spine Surgeon in the next 1 week. I'd recommend Drs. Roney JaffeElsner, Stern Contact information: [address] [place] [address]  [phone]          Allergies  Allergen Reactions  . Digoxin Other (See Comments)    Felt weak, tired, "aweful"  . Nitrofurantoin Other (See Comments)    Sharp pain back of head  . Alendronate Sodium Other (See Comments)    cramps  . Simvastatin Other (See Comments)    Myalgia, possible intolerance  . Sulfasalazine Other (See Comments)    REACTION: (Maybe)-neck pain-brief  . Sulfonamide Derivatives Other (See Comments)    REACTION: (Maybe)-neck pain-brief  . Alendronate Other (See Comments)  . Alendronate Sodium   . Chlordiazepoxide Hcl Other (See Comments)  . Macrobid  [Nitrofurantoin Macrocrystal]   . Pb-Hyoscy-Atropine-Scopolamine Other (See Comments)  . Pravastatin Other (See Comments)    Myalgias  . Augmentin [Amoxicillin-Pot Clavulanate] Rash    In mouth  . Cefaclor Other (See Comments)    Felt bad  . Cephalexin Other (See Comments)    Felt bad, but able to tolerate amoxil prev  . Chlordiazepoxide Other (See Comments)    REACTION: (Maybe)  . Clavulanic Acid Other (See Comments)    Not an allergy.  Tolerates plain amoxil but not augmentin- diarrhea, etc with augmentin  . Omeprazole Rash    rash  . Oxcarbazepine Other (See Comments)    unknown  . Pb-Hyoscy-Atropine-Scopolamine Other (See  Comments)    Unsure-doesn't remember  . Streptomycin Other (See Comments)    Lips itched  . Sulfa Antibiotics Other (See Comments)    Unsure-doesn't remember  . Tetracycline Itching    itching  . Zetia [Ezetimibe] Other (See Comments)    possible cause of aches and abnormal dreams, stopped 05/2014    Consultations: Neurosurgery phone  Procedures/Studies: Mr Lumbar Spine Wo Contrast  Result Date: 09/03/2018 CLINICAL DATA:  85 year old female with back pain radiating to the right hip for 1 month. EXAM: MRI LUMBAR SPINE WITHOUT CONTRAST TECHNIQUE: Multiplanar, multisequence MR imaging of the lumbar spine was performed. No intravenous contrast was administered. COMPARISON:  CT Abdomen and Pelvis earlier today. Lumbar radiographs 06/06/2015. FINDINGS: Segmentation:  Normal on the comparison CT. Alignment: Mild levoconvex lumbar scoliosis has progressed since 2016. Chronic anterolisthesis of L5 on S1 appears stable and is grade 2 (11 millimeters). Mild retrolisthesis of L2 on L3 is stable. Vertebrae: Chronic bilateral L5 pars fractures better demonstrated by CT today. Chronic postoperative changes to the posterior elements of L4 and L5. No marrow edema or evidence of acute osseous abnormality. Intact visible sacrum and SI joints. Conus medullaris and cauda equina: Conus extends to the L1 level. No lower spinal cord or conus signal abnormality. Paraspinal and other soft tissues: Stable from the CT earlier today. Disc levels: No lower thoracic spinal stenosis. T12-L1: Posterior and right eccentric disc bulge. Mild right T12 foraminal stenosis. L1-L2: Circumferential disc bulge and endplate spurring with mild facet hypertrophy. Mild left and moderate right L1 foraminal stenosis. L2-L3: Disc space loss with circumferential disc osteophyte complex eccentric to the left. Mild to moderate facet and ligament flavum hypertrophy. Mild spinal stenosis. Moderate to severe bilateral L2 foraminal stenosis. L3-L4: Disc  space loss with left eccentric circumferential disc osteophyte complex. Moderate to severe facet and ligament flavum hypertrophy. Trace degenerative facet joint fluid on the left and small superimposed 6 millimeter degenerative synovial cyst projecting into the spinal canal from the posterior left of midline (series 6, image 22 and series 5, image 8). Moderate to severe spinal and left greater than right lateral recess stenosis (L4 nerve levels). Severe left and mild  to moderate right L3 foraminal stenosis. L4-L5: Postoperative changes to the posterior elements which are ankylosed bilaterally. Negative disc and no stenosis. L5-S1: Grade 2 anterolisthesis with disc space loss and circumferential disc/pseudo disc. Chronic pars fractures and bilateral posterior element hypertrophy plus previous posterior decompression. No definite ankylosis at this level. No spinal or lateral recess stenosis. Moderate to severe left and severe right L5 foraminal stenosis (series 4, image 4 on the right). IMPRESSION: 1. Previous posterior decompression of L4-L5 and L5-S1. - solid posterior element ankylosis at L4-L5 without stenosis. - at L5-S1 there are chronic pars fractures with grade 2 spondylolisthesis, and multifactorial moderate to severe right greater than left L5 foraminal stenosis. 2. Adjacent segment disease at L3-L4 with advanced disc and posterior element degeneration. Multifactorial moderate to severe spinal, left greater than right lateral recess stenosis, and left greater than right neural foraminal stenosis. 3. L2-L3 mild spinal but moderate to severe bilateral foraminal stenosis. Electronically Signed   By: Odessa Fleming M.D.   On: 09/03/2018 22:40   Ct Abdomen Pelvis W Contrast  Result Date: 09/03/2018 CLINICAL DATA:  Back and right hip pain for the past month. EXAM: CT ABDOMEN AND PELVIS WITH CONTRAST TECHNIQUE: Multidetector CT imaging of the abdomen and pelvis was performed using the standard protocol following bolus  administration of intravenous contrast. CONTRAST:  ISOVUE-300 IOPAMIDOL (ISOVUE-300) INJECTION 61% COMPARISON:  Lumbar spine radiographs dated 06/06/2015 FINDINGS: Lower chest: Dense atheromatous coronary artery calcifications. Mild cylindrical bronchiectasis and scarring in the medial aspect of the right lower lobe. Hepatobiliary: Small gallstones in the dependent portion of the gallbladder. The largest measures 6 mm. No gallbladder wall thickening or pericholecystic fluid. Small cysts in the dome of the liver on the right. Pancreas: Unremarkable. No pancreatic ductal dilatation or surrounding inflammatory changes. Spleen: Normal in size without focal abnormality. Adrenals/Urinary Tract: Normal appearing adrenal glands. Small bilateral renal cysts. Unremarkable ureters and urinary bladder. Stomach/Bowel: Multiple sigmoid colon diverticula without evidence of diverticulitis. Surgically absent appendix. Unremarkable stomach and small bowel. Vascular/Lymphatic: Atheromatous arterial calcifications without aneurysm. No enlarged lymph nodes. Reproductive: Status post hysterectomy. No adnexal masses. Other: Small umbilical hernia containing fat. Musculoskeletal: Lumbar and lower thoracic spine degenerative changes. Bilateral L5 pars interarticularis defects with associated grade 2 anterolisthesis at the L5-S1 level. Fusion of the posterior elements at the L4-5 level. Facet degenerative changes in the mid lumbar spine with grade 1 anterolisthesis at the L3-4 level and grade 1 retrolisthesis at the L2-3 level. IMPRESSION: 1. No acute abnormality. 2. Cholelithiasis. 3. Sigmoid diverticulosis. 4. Dense atheromatous coronary artery calcifications and calcific aortic atherosclerosis. 5. Bilateral L5 spondylolysis with associated grade 2 spondylolisthesis at the L5-S1 level. Electronically Signed   By: Beckie Salts M.D.   On: 09/03/2018 20:33      Subjective: Still with back pain, notice some improvement.   Discharge Exam: Vitals:   09/04/18 2100 09/05/18 0512  BP: (!) 107/47 113/62  Pulse: 73 61  Resp: 15 16  Temp: 98.3 F (36.8 C) 97.7 F (36.5 C)  SpO2: 94% 93%   Vitals:   09/04/18 0502 09/04/18 1346 09/04/18 2100 09/05/18 0512  BP: (!) 122/52 (!) 143/67 (!) 107/47 113/62  Pulse: 72 88 73 61  Resp: 14 14 15 16   Temp: 97.7 F (36.5 C) 98.4 F (36.9 C) 98.3 F (36.8 C) 97.7 F (36.5 C)  TempSrc: Oral  Oral Oral  SpO2: 92% 94% 94% 93%  Weight:      Height:  General: Pt is alert, awake, not in acute distress Cardiovascular: RRR, S1/S2 +, no rubs, no gallops Respiratory: CTA bilaterally, no wheezing, no rhonchi Abdominal: Soft, NT, ND, bowel sounds + Extremities: no edema, no cyanosis    The results of significant diagnostics from this hospitalization (including imaging, microbiology, ancillary and laboratory) are listed below for reference.     Microbiology: No results found for this or any previous visit (from the past 240 hour(s)).   Labs: BNP (last 3 results) No results for input(s): BNP in the last 8760 hours. Basic Metabolic Panel: Recent Labs  Lab 09/03/18 1844 09/04/18 0401  NA 141 140  K 3.6 4.4  CL 104 105  CO2 27 25  GLUCOSE 101* 137*  BUN 11 13  CREATININE 0.79 0.87  CALCIUM 9.3 9.2   Liver Function Tests: Recent Labs  Lab 09/03/18 1844  AST 24  ALT 18  ALKPHOS 68  BILITOT 0.6  PROT 7.3  ALBUMIN 4.3   Recent Labs  Lab 09/03/18 1844  LIPASE 29   No results for input(s): AMMONIA in the last 168 hours. CBC: Recent Labs  Lab 09/03/18 1844 09/04/18 0401  WBC 8.4 9.3  NEUTROABS 5.3  --   HGB 14.5 14.2  HCT 44.3 43.5  MCV 100.9* 102.4*  PLT 242 232   Cardiac Enzymes: No results for input(s): CKTOTAL, CKMB, CKMBINDEX, TROPONINI in the last 168 hours. BNP: Invalid input(s): POCBNP CBG: No results for input(s): GLUCAP in the last 168 hours. D-Dimer No results for input(s): DDIMER in the last 72 hours. Hgb A1c No  results for input(s): HGBA1C in the last 72 hours. Lipid Profile No results for input(s): CHOL, HDL, LDLCALC, TRIG, CHOLHDL, LDLDIRECT in the last 72 hours. Thyroid function studies No results for input(s): TSH, T4TOTAL, T3FREE, THYROIDAB in the last 72 hours.  Invalid input(s): FREET3 Anemia work up No results for input(s): VITAMINB12, FOLATE, FERRITIN, TIBC, IRON, RETICCTPCT in the last 72 hours. Urinalysis    Component Value Date/Time   COLORURINE STRAW (A) 09/03/2018 1844   APPEARANCEUR CLEAR 09/03/2018 1844   LABSPEC 1.006 09/03/2018 1844   PHURINE 6.0 09/03/2018 1844   GLUCOSEU NEGATIVE 09/03/2018 1844   HGBUR NEGATIVE 09/03/2018 1844   BILIRUBINUR NEGATIVE 09/03/2018 1844   BILIRUBINUR Negative 01/11/2018 1124   KETONESUR NEGATIVE 09/03/2018 1844   PROTEINUR NEGATIVE 09/03/2018 1844   UROBILINOGEN 0.2 01/11/2018 1124   UROBILINOGEN 0.2 08/12/2014 2227   NITRITE NEGATIVE 09/03/2018 1844   LEUKOCYTESUR NEGATIVE 09/03/2018 1844   Sepsis Labs Invalid input(s): PROCALCITONIN,  WBC,  LACTICIDVEN Microbiology No results found for this or any previous visit (from the past 240 hour(s)).   Time coordinating discharge: 35 minutes  SIGNED:   Alba Cory, MD  [facility] 09/05/2018, 10:07 AM   If 7PM-7AM, please contact night-coverage [email or web address] Password [email or web address]

## 2018-09-06 ENCOUNTER — Telehealth: Payer: Self-pay

## 2018-09-06 NOTE — Telephone Encounter (Signed)
Transition Care Management Follow-up Telephone Call   Date discharged? 09/05/2018   How have you been since you were released from the hospital? She is doing good, she is still having a little back pain.   Do you understand why you were in the hospital? Yes   Do you understand the discharge instructions? yes   Where were you discharged to? Home   Items Reviewed:  Medications reviewed: yes-suppose to wean off the Prednisone.  Allergies reviewed: yes  Dietary changes reviewed: yes  Referrals reviewed: yes- follow up with PCP, follow up with neurosurgeon. Not sure if Home Health will be coming out to the patient or not.   Functional Questionnaire:  Activities of Daily Living (ADLs):   She states they are independent in the following: dressing, toileting, feeding, hygiene for the most part. States they require assistance with the following: ambulation- hand held cane at this time, sometimes with bathing.   Any transportation issues/concerns?: yes   Any patient concerns? Not at this time.   Confirmed importance and date/time of follow-up visits scheduled yes  Provider Appointment booked with Dr. Para March on 09/12/2018 at 2:15 pm.  Confirmed with patient if condition begins to worsen call PCP or go to the ER.  Patient was given the office number and encouraged to call back with question or concerns.  : yes.

## 2018-09-06 NOTE — Telephone Encounter (Signed)
Noted. Thanks.

## 2018-09-09 ENCOUNTER — Telehealth: Payer: Self-pay

## 2018-09-09 ENCOUNTER — Other Ambulatory Visit: Payer: Self-pay | Admitting: Pharmacist

## 2018-09-09 MED ORDER — ALIROCUMAB 75 MG/ML ~~LOC~~ SOAJ: 2.0000 "pen " | SUBCUTANEOUS | 3 refills | Status: DC

## 2018-09-09 NOTE — Telephone Encounter (Signed)
Returned call to pt regarding praluent pt frustrated that praluent costed $125.00 put pt on hold to speak with raquel. Pt also upset that she only got one month supply instead of three.

## 2018-09-09 NOTE — Telephone Encounter (Signed)
Pt called stating they they only received one box of praluent instead of a 90 day supply pt stated that only one box was called in. I previously ordered praluent 75 2 pen per 28 days with 6 refills I do believe. Pt wants [place] to call her back. Pt thought that she would pick up all three boxes

## 2018-09-09 NOTE — Telephone Encounter (Signed)
New Rx for 90day supply submitted to pharmacy per patient's request.   [facility] closed for the holiday. Will follow up tomorrow to determine if deductible is affecting her co-pay for January.

## 2018-09-10 NOTE — Telephone Encounter (Signed)
Spoke with insurance company - patient copay for January included $50 deductible.   According to them, if she gets a 3 month supply in 2020, her copay is $249.  Called patient, she voiced understanding of this and will get the 84 days supply next month.

## 2018-09-11 ENCOUNTER — Telehealth: Payer: Self-pay | Admitting: Pharmacist Clinician (PhC)/ Clinical Pharmacy Specialist

## 2018-09-11 ENCOUNTER — Telehealth: Payer: Self-pay | Admitting: *Deleted

## 2018-09-11 NOTE — Telephone Encounter (Signed)
Spoke to pt and advised per Dr Milinda Antis.Verbally expressed understanding and will continue until her upcoming appt

## 2018-09-11 NOTE — Addendum Note (Signed)
Addended by: Rosalee Kaufman on: 09/11/2018 02:38 PM   Modules accepted: Orders

## 2018-09-11 NOTE — Telephone Encounter (Signed)
Spoke to pts husband Ed, who states pt is currently taking plavix and is wanting to d/c it. He states they recently had a friend to bleed out, which made them investigate the side effects of taking it. Pt has upcoming hospital f/u 01/23, but it wanting to know if she can stop taking plavix today. pls advise

## 2018-09-11 NOTE — Telephone Encounter (Addendum)
Agree, continue until the OV unless she has sig bleeding.  If she has sig bleeding (and not just some bruising), then she needs eval.  Thanks.

## 2018-09-11 NOTE — Telephone Encounter (Signed)
That needs to be reviewed by her PCP when he is back in the office  Do not stop it  If any current bleeding let us know

## 2018-09-11 NOTE — Telephone Encounter (Signed)
Spoke with patient, she was in hospital earlier in January with back/abdominal pain.  She believes that this pain was due to her Praluent injection.  She is feeling better now, although still some residual muscle spasm.     Advised patient that we can try Repatha to see if she tolerates better.  She will come to the office and pick up a sample.  Her next dose is due tomorrow, but I advised that she wait one week to be sure all current symptoms have dissipated .  Patient voiced understanding.

## 2018-09-12 ENCOUNTER — Ambulatory Visit: Payer: Medicare Other | Admitting: Family Medicine

## 2018-09-12 ENCOUNTER — Encounter: Payer: Self-pay | Admitting: Family Medicine

## 2018-09-12 VITALS — BP 126/70 | HR 73 | Temp 98.4°F | Ht 61.0 in | Wt 187.5 lb

## 2018-09-12 DIAGNOSIS — Z7189 Other specified counseling: Secondary | ICD-10-CM | POA: Diagnosis not present

## 2018-09-12 DIAGNOSIS — M545 Low back pain, unspecified: Secondary | ICD-10-CM

## 2018-09-12 MED ORDER — LISINOPRIL 2.5 MG PO TABS: 2.5000 mg | ORAL_TABLET | Freq: Every day | ORAL | Status: DC

## 2018-09-12 MED ORDER — CLOPIDOGREL BISULFATE 75 MG PO TABS: 75.0000 mg | ORAL_TABLET | Freq: Every day | ORAL | Status: DC

## 2018-09-12 MED ORDER — METOPROLOL SUCCINATE ER 25 MG PO TB24: 25.0000 mg | ORAL_TABLET | Freq: Every day | ORAL | Status: DC

## 2018-09-12 NOTE — Progress Notes (Signed)
Admit date: 09/03/2018 Discharge date: 09/05/2018  Admitted From: Home  Disposition: Home   Recommendations for Outpatient Follow-up:  1. Follow up with PCP in 1-2 weeks 2. Needs close follow up with neurosurgery,  3. She was discharge on prednisone for 5 days.   Home Health; none  Discharge Condition:stable.  CODE STATUS: full code.  Diet recommendation: Heart Healthy   Brief/Interim Summary: Robin Juarez a 85 y.o.femalewithhistory of hypertension, hyperlipidemia, depression presents to the ER because of acute worsening of the low back pain over the last 3 days. Patient states he has been having low back pain for last few days which is acutely worsened over the last 3 to 4 days. Denies any fall or incontinence of urine. Denies any loss of function of the lower extremities.  ED Course:In the ER MRI of the L-spine shows severe neural foraminal stenosis at L3 on 4. On-call neurosurgeon was consulted. Neurosurgeon advised patient to be kept on Decadron and outpatient follow-up with them. Since patient having significant pain patient has been admitted for pain relief.   Back pain, L 3-4-5 spinal stenosis, moderate to severe, chronic pars fracture; Case was discussed with neurosurgery who recommend steroids. Pain management and referral to out patient neurosurgery.  Patient improved on prednisone, oxycodone and baclofen.   For her other chronic medical problems continue with current medications.   Discharge Diagnoses:  Principal Problem:   Low back pain Active Problems:   OSA (obstructive sleep apnea)   Spinal stenosis, lumbar region, with neurogenic claudication   Essential hypertension  ======================================== Inpatient course discussed with patient.  She was admitted with lower back pain that was wrapping around into the abdomen.  There was no clear trigger for the onset of pain prior to hospitalization.  No falls.  No trauma.  She was  admitted and had abnormal imaging of her lower spine and was treated supportively in the meantime.  She did improve and was able to be discharged.  Per her report she has seen neurosurgery in the meantime and was told that the MRI findings would not explain her symptoms.  I presume this to main that her symptoms radiating around into the abdomen would not be explained by the lower lumbar findings, meaning the dermatome/nerve root levels did not match up.  That said, I am awaiting the outpatient neurosurgery notes.  She didn't take oxycodone since coming home. She is done with prednisone.  She didn't know if this could be related to praluent.  She had lower abd stabbing pain.  "worst pain I've ever had, it felt like my muscles were in a spasm."  She is going to follow-up with the cardiology clinic about that.  We talked about plavix use.  Disked risk and benefit.  She was worried about taking the medication that did not have a "antidote" if she did have bleeding.  The main issue here is to decide if her medication has a likely not benefit.  She has not had any abnormal bleeding.  I encouraged her not to stop this medication at least until she could talk to Dr. Allyson Sabal.  PMH and SH reviewed  ROS: Per HPI unless specifically indicated in ROS section   Meds, vitals, and allergies reviewed.   GEN: nad, alert and oriented HEENT: mucous membranes moist NECK: supple w/o LA CV: rrr. PULM: ctab, no inc wob ABD: soft, +bs EXT: no edema SKIN: no acute rash

## 2018-09-12 NOTE — Patient Instructions (Addendum)
Talk to Dr. Allyson Sabal about the plavix before you stop it.   Ask him about the cholesterol medicine.   Unclear if this was related to cholesterol medicine, changes higher up in your back, or both.   I'll await the notes from Dr. Franky Macho.   Take care.  Glad to see you.

## 2018-09-12 NOTE — Telephone Encounter (Signed)
Appointment 09/12/2018 with Dr. Para March.

## 2018-09-15 NOTE — Assessment & Plan Note (Signed)
She had abnormal MRI but she also had significant muscle cramping and pain that radiated into the abdomen that may not be explained by her MRI findings.  I will await the neurosurgery notes.  Unclear to me if she could have symptoms higher up in the spine, in the lower thoracic spine that was not imaged on MRI of L-spine versus pain related to PSK 9 inhibitor.  I will route this note to cardiology in the meantime for input.  We talked at length about Plavix use.  I advised her not to stop this in the meantime until she can please talk to cardiology.  She agreed with that.

## 2018-09-16 ENCOUNTER — Ambulatory Visit: Payer: Medicare Other

## 2018-09-23 ENCOUNTER — Encounter: Payer: Medicare Other | Admitting: Family Medicine

## 2018-09-25 ENCOUNTER — Ambulatory Visit: Payer: Medicare Other | Admitting: Cardiovascular Disease

## 2018-09-25 ENCOUNTER — Encounter: Payer: Self-pay | Admitting: Cardiovascular Disease

## 2018-09-25 DIAGNOSIS — I208 Other forms of angina pectoris: Secondary | ICD-10-CM | POA: Diagnosis not present

## 2018-09-25 DIAGNOSIS — I10 Essential (primary) hypertension: Secondary | ICD-10-CM

## 2018-09-25 DIAGNOSIS — I2089 Other forms of angina pectoris: Secondary | ICD-10-CM

## 2018-09-25 DIAGNOSIS — E782 Mixed hyperlipidemia: Secondary | ICD-10-CM

## 2018-09-25 NOTE — Assessment & Plan Note (Signed)
History of essential hypertension with blood pressure measured today 140/84.  She is on lisinopril and metoprolol.  Continue current medications.

## 2018-09-25 NOTE — Assessment & Plan Note (Signed)
History of atypical chest pain in the past with a positive stress test cardiac catheterization performed by Dr. Tresa Endo 06/15/2017 revealing noncritical CAD.  Medical therapy was recommended.

## 2018-09-25 NOTE — Progress Notes (Signed)
09/25/2018 Robin KayserMartha J Winchell   1930-12-31  [id number]  Primary Physician Joaquim Namuncan, Graham S, MD Primary Cardiologist: Runell GessJonathan J Arsenio Schnorr MD Nicholes CalamityFACP, FACC, FAHA, [place]  HPI:  [name] is a 85 y.o.  moderately overweight married Caucasian female mother of 4 children, grandmother of [id number] grandchildren who self-referred for evaluation of chest pain. I last saw her in the office  12/26/2017.Her primary care physician is Dr. Crawford GivensGraham Duncan. She took up in by one of her daughters [name] who I know. Her cardiac risk factors are notable for family history with brother does have coronary artery bypass grafting. She probably does have hyperlipidemia and hypertension which is not treated. She began having chest pain 2 weeks ago which is left precordial and radiates to her back. There are no other associated symptoms. She also initially had strokelike symptoms and had an MRI that showed a subacute stroke in the corona radiata. She had a Myoview stress test that was low risk but that show subtle anteroapical ischemia. I performed cardiac catheterization on her 09/03/14 through the right femoral approach revealing at most 50% mid LAD stenosis after a moderate sized diagonal branch with otherwise normal coronary arteries and normal LV function. I thought her chest pain was noncardiac. Since I saw her in the office a year ago she's remained clinically stableuntil recently when [facility] pain. She ruled out for myocardial infarction. Her echo was normal with grade 1 diastolic dysfunction. A CT FFR suggested disease in the IV territory which led to a cardiac catheterization by Dr. Tresa EndoKelly the same day revealing noncritical CAD. Since I saw her 8 months ago she is remained relatively stable.  She has had admission for low back pain and abdominal pain in a bandlike distribution which sounds radicular.  She denies chest pain or shortness of breath.   Current Meds    Medication Sig  . baclofen 5 MG TABS Take 5 mg by mouth 3 (three) times daily as needed for muscle spasms.  . cholecalciferol (VITAMIN D) 1000 UNITS tablet Take 1,000 Units by mouth daily.    . clopidogrel (PLAVIX) 75 MG tablet Take 1 tablet (75 mg total) by mouth daily.  . Coenzyme Q10 (CO Q 10 PO) Take 1 tablet by mouth daily.  . diclofenac sodium (VOLTAREN) 1 % GEL Apply 2 g topically 4 (four) times daily as needed (for joint aches).  . DULoxetine (CYMBALTA) 30 MG capsule Take 90 mg by mouth daily.   . folic acid (FOLVITE) 800 MCG tablet Take 800 mcg by mouth daily.   [name]. gabapentin (NEURONTIN) 300 MG capsule Take 300-600 mg by mouth See admin instructions. 300 mg at lunch and supper and 600 mg in the evening  . hydrocortisone 2.5 % ointment Apply 1 application topically daily as needed (rash).   [name]. KRILL OIL PO Take 100 mg by mouth daily.  [name]. lamoTRIgine (LAMICTAL) 100 MG tablet Take 100 mg by mouth at bedtime.  [name]. lisinopril (PRINIVIL,ZESTRIL) 2.5 MG tablet Take 1 tablet (2.5 mg total) by mouth at bedtime.  . metoprolol succinate (TOPROL-XL) 25 MG 24 hr tablet Take 1 tablet (25 mg total) by mouth daily after supper.  [name]. OLANZapine (ZYPREXA) 2.5 MG tablet Take 1 tablet (2.5 mg total) by mouth at bedtime.  . polyethylene glycol (MIRALAX / GLYCOLAX) packet Take 17 g by mouth daily.     Allergies  Allergen Reactions  . Digoxin Other (See Comments)  Felt weak, tired, "aweful"  . Nitrofurantoin Other (See Comments)    Sharp pain back of head  . Alendronate Sodium Other (See Comments)    cramps  . Cephalosporins     She "felt bad" while taking multiple cephalosporins but has been able tolerate amoxicillin previously.  No clear cephalosporin allergy.  . Chlordiazepoxide Hcl Other (See Comments)  . Pb-Hyoscy-Atropine-Scopolamine Other (See Comments)  . Ritalin [Methylphenidate Hcl]     intolerant  . Statins     Myalgia.  Intolerant of multiple statins, including pravastatin and  simvastatin.  . Chlordiazepoxide Other (See Comments)  . Clavulanic Acid Other (See Comments)    Not an allergy.  Tolerates plain amoxil but not augmentin- diarrhea, etc with augmentin  . Omeprazole Rash    rash  . Oxcarbazepine Other (See Comments)    unknown  . Sulfa Antibiotics Other (See Comments)    Unsure-doesn't remember  . Tetracycline Itching    itching  . Zetia [Ezetimibe] Other (See Comments)    possible cause of aches and abnormal dreams, stopped 05/2014    Social History   Socioeconomic History  . Marital status: Married    Spouse name: Not on file  . Number of children: 4  . Years of education: Not on file  . Highest education level: Not on file  Occupational History  . Occupation: RETIRED    Employer: RETIRED  Social Needs  . Financial resource strain: Not on file  . Food insecurity:    Worry: Not on file    Inability: Not on file  . Transportation needs:    Medical: Not on file    Non-medical: Not on file  Tobacco Use  . Smoking status: Never Smoker  . Smokeless tobacco: Never Used  . Tobacco comment: a small amount when 85 yo  Substance and Sexual Activity  . Alcohol use: No    Alcohol/week: 0.0 standard drinks  . Drug use: No  . Sexual activity: Not Currently  Lifestyle  . Physical activity:    Days per week: Not on file    Minutes per session: Not on file  . Stress: Not on file  Relationships  . Social connections:    Talks on phone: Not on file    Gets together: Not on file    Attends religious service: Not on file    Active member of club or organization: Not on file    Attends meetings of clubs or organizations: Not on file    Relationship status: Not on file  . Intimate partner violence:    Fear of current or ex partner: Not on file    Emotionally abused: Not on file    Physically abused: Not on file    Forced sexual activity: Not on file  Other Topics Concern  . Not on file  Social History Narrative   Married 1953, husband is  well.   5 pregnancies, 4 live births, all well (5th was unexpect pregnancy and patient needed to terminate due to concurrent medical problems at the time)   11 grandchildren.  3 great grandchildren   Regular exercise:  No     Review of Systems: General: negative for chills, fever, night sweats or weight changes.  Cardiovascular: negative for chest pain, dyspnea on exertion, edema, orthopnea, palpitations, paroxysmal nocturnal dyspnea or shortness of breath Dermatological: negative for rash Respiratory: negative for cough or wheezing Urologic: negative for hematuria Abdominal: negative for nausea, vomiting, diarrhea, bright red blood per rectum, melena, or hematemesis  Neurologic: negative for visual changes, syncope, or dizziness All other systems reviewed and are otherwise negative except as noted above.    Blood pressure 140/84, pulse 86, height 5' (1.524 m), weight 191 lb 3.2 oz (86.7 kg), SpO2 93 %.  General appearance: alert and no distress Neck: no adenopathy, no carotid bruit, no JVD, supple, symmetrical, trachea midline and thyroid not enlarged, symmetric, no tenderness/mass/nodules Lungs: clear to auscultation bilaterally Heart: regular rate and rhythm, S1, S2 normal, no murmur, click, rub or gallop Extremities: extremities normal, atraumatic, no cyanosis or edema Pulses: 2+ and symmetric Skin: Skin color, texture, turgor normal. No rashes or lesions Neurologic: Alert and oriented X 3, normal strength and tone. Normal symmetric reflexes. Normal coordination and gait  EKG not performed today  ASSESSMENT AND PLAN:   HLD (hyperlipidemia) History of hyperlipidemia on Praluent was which was recently held because of potential side effects.  Her last lipid profile performed 08/01/2018 revealed total cholesterol 165, LDL of 68 and HDL of 70.  Essential hypertension History of essential hypertension with blood pressure measured today 140/84.  She is on lisinopril and metoprolol.   Continue current medications.  Chest pain History of atypical chest pain in the past with a positive stress test cardiac catheterization performed by Dr. Tresa Endo 06/15/2017 revealing noncritical CAD.  Medical therapy was recommended.      Runell Gess MD FACP,FACC,FAHA, [facility] 09/25/2018 12:24 PM

## 2018-09-25 NOTE — Patient Instructions (Signed)
Medication Instructions:  Your physician has recommended you make the following change in your medication:  STOP TAKING YOUR PLAVIX  If you need a refill on your cardiac medications before your next appointment, please call your pharmacy.   Lab work: NONE If you have labs (blood work) drawn today and your tests are completely normal, you will receive your results only by: [name] MyChart Message (if you have MyChart) OR . A paper copy in the mail If you have any lab test that is abnormal or we need to change your treatment, we will call you to review the results.  Testing/Procedures: NONE  Follow-Up: At [facility], you and your health needs are our priority.  As part of our continuing mission to provide you with exceptional heart care, we have created designated Provider Care Teams.  These Care Teams include your primary Cardiologist (physician) and Advanced Practice Providers (APPs -  Physician Assistants and Nurse Practitioners) who all work together to provide you with the care you need, when you need it. . You will need a follow up appointment in 12 months.  Please call our office 2 months in advance to schedule this appointment.  You may see Dr. Allyson Sabal or one of the following Advanced Practice Providers on your designated Care Team:   . Corine Shelter, [place] . Azalee Course, PA-C . [name], PA-C . Joni Reining, DNP . Theodore Demark, PA-C . Judy Pimple, PA-C . Marjie Skiff, PA-C

## 2018-09-25 NOTE — Addendum Note (Signed)
Addended by: Harlow Asa on: 09/25/2018 12:31 PM   Modules accepted: Orders

## 2018-09-25 NOTE — Assessment & Plan Note (Signed)
History of hyperlipidemia on Praluent was which was recently held because of potential side effects.  Her last lipid profile performed 08/01/2018 revealed total cholesterol 165, LDL of 68 and HDL of 70.

## 2018-10-01 ENCOUNTER — Telehealth: Payer: Self-pay

## 2018-10-01 NOTE — Telephone Encounter (Signed)
Returned a call to patient to let them know that I will be working on a repatha pa

## 2018-10-04 ENCOUNTER — Other Ambulatory Visit: Payer: Self-pay | Admitting: Pharmacist Clinician (PhC)/ Clinical Pharmacy Specialist

## 2018-10-04 MED ORDER — EVOLOCUMAB 140 MG/ML ~~LOC~~ SOAJ: 140.0000 mg | SUBCUTANEOUS | 12 refills | Status: DC

## 2018-10-07 ENCOUNTER — Telehealth: Payer: Self-pay | Admitting: Pharmacist Clinician (PhC)/ Clinical Pharmacy Specialist

## 2018-10-07 MED ORDER — EVOLOCUMAB 140 MG/ML ~~LOC~~ SOAJ: 140.0000 mg | SUBCUTANEOUS | 12 refills | Status: DC

## 2018-10-07 NOTE — Telephone Encounter (Signed)
Repatha  Patient information gathered and submitted to [employer] for prior authorization of REPATHA.  Patient had adverse reaction to Praluent (abdominal wall pains)   Prior authorization approved by [employer] on 10/04/2018  and will expire on 2/31/2021 . Patient was notified of approval, prescription called [facility] in [place].  Patient is aware that they will need to contact their pharmacy to determine copay.  If this is cost prohibitive, patient will need to call back to our office.    Copay at local pharmacy   First injection date   Copay cost-prohibitive, patient was given information regarding patient assistance options.   Patient assistance:

## 2018-11-01 ENCOUNTER — Other Ambulatory Visit: Payer: Self-pay | Admitting: Pharmacist Clinician (PhC)/ Clinical Pharmacy Specialist

## 2018-11-01 MED ORDER — ALIROCUMAB 75 MG/ML ~~LOC~~ SOAJ: 75.0000 mg | SUBCUTANEOUS | 3 refills | Status: DC

## 2018-11-04 NOTE — Telephone Encounter (Signed)
Patient did not like cost of Repatha.  Wished to go back on Praluent.  PA not required.  Rx sent to pharmacy

## 2018-11-30 ENCOUNTER — Other Ambulatory Visit: Payer: Self-pay | Admitting: Cardiovascular Disease

## 2018-12-02 NOTE — Telephone Encounter (Signed)
Rx(s) sent to pharmacy electronically.  

## 2018-12-04 ENCOUNTER — Telehealth: Payer: Self-pay | Admitting: Family Medicine

## 2018-12-04 NOTE — Telephone Encounter (Signed)
Patient was given a rx for muscle relaxer when she was in the hospital.  Patient said she's almost out of the medication and wants to know if Dr.Duncan can prescribe it for her.  The original rx was for 3 x a day, but she never used that many.  She usually takes 1 x a day, if she needs it.  Patient said the medication is Baclofen 5 mg.  [facility] uses [facility] [place].

## 2018-12-05 MED ORDER — BACLOFEN 5 MG PO TABS: 5.0000 mg | ORAL_TABLET | Freq: Every day | ORAL | 2 refills | Status: DC | PRN

## 2018-12-05 NOTE — Telephone Encounter (Signed)
Sent.  Update me as needed.  Thanks.

## 2019-01-06 ENCOUNTER — Ambulatory Visit: Payer: Medicare Other

## 2019-01-07 ENCOUNTER — Ambulatory Visit: Payer: Medicare Other

## 2019-01-14 ENCOUNTER — Encounter: Payer: Medicare Other | Admitting: Family Medicine

## 2019-01-24 ENCOUNTER — Telehealth: Payer: Self-pay | Admitting: Cardiovascular Disease

## 2019-01-24 NOTE — Telephone Encounter (Signed)
  Robin Juarez is c/o her blood pressure being around 131/101 in the mornings when she gets up. She would like to know if it is normal for it to run high first thing in the morning. She said it gets better during the day it is just high in the mornings.

## 2019-01-24 NOTE — Telephone Encounter (Signed)
Spoke with pt, she has noticed her bp is elevated first thing in the morning. She takes both the lisinopril and metoprolol in the evening. She will track her bp for the next 5 days and call the office to report those numbers. During the day it is running 118-131/78-89. Otherwise, she feels fine with no other issues. Will consider adjusting medications once bp tracked for longer than 1 day. Pt agreed with this plan.

## 2019-01-28 ENCOUNTER — Telehealth: Payer: Self-pay | Admitting: Cardiovascular Disease

## 2019-01-28 NOTE — Telephone Encounter (Signed)
Have her continue to track her blood pressures over the next several weeks.  If continues to be elevated have her see an APP in the office.

## 2019-01-28 NOTE — Telephone Encounter (Signed)
New message   Patient is reporting b/p readings:   134/103 7:00am 147/105 7:45am 113/86 12:30am 154/92 7:00am 141/103 7:30am 115/89 3:00am  131/101 7:00am

## 2019-01-28 NOTE — Telephone Encounter (Signed)
Contacted pt and advised to keep log of BP, checking BID for the next several weeks and then call office to report. Informed pt that if BP remains elevated per her log, she will be set up for appt with APP in office. Pt verbalized understanding

## 2019-01-28 NOTE — Telephone Encounter (Signed)
Patient called last week and reported elevated bp in the evening. She was ask to track her bp and she has called with those readings. Will forward to dr berry to review and advise.

## 2019-02-20 ENCOUNTER — Telehealth: Payer: Self-pay | Admitting: Cardiovascular Disease

## 2019-02-20 NOTE — Telephone Encounter (Signed)
Returned call to pt she states that she is a little concerned with the times when the BP numbers are higher side but she states that it gets high in the mornings and she will continue to take BP in the morning and then an hour later to see if it continues to be high. She will call back if it is trending on the higher side.she would like JB to review these reading for his direction. Will forward for further direction.

## 2019-02-20 NOTE — Telephone Encounter (Signed)
Patient was told to take her BP and HR 2x daily. She states her BP was higher than normal, so she was concerned. She had a list ready to mail. No dates were given.   Morning and Afternoon  7:30 am 155/86 HR 72 ;  7:30 pm 124/75 HR 78 7:00 am 127/84 HR 75 ;  7:30 pm 120/72 HR 78 7:30 am 142/79 HR 74 ;  10:30pm 112/74 HR 66 8:00 am 139/84 HR 74 ;  10:00pm 126/77 HR 78 7:30 am 135/83 HR 77 ;  9:30 pm  123/69 HR 74 7:30 am 140/92 HR 87 ;  8:00 pm 127/85 HR 80 7:00 am 133/83 HR 72 ;  no evening reading 9:00 am 126/76 HR 74 ;  9:15 pm 125/74 HR 66  7:30 am 164/92 HR 69 ;  6:30 pm 145/89 HR 89 8:00 am 165/93 HR 76 ;  8:00 pm 145/78 HR 72 8:00 am 140/79 HR 75 ;  8:00 pm 138/81 HR 82 8:45 am 123/83 HR 70 ;  8:00 pm 133/68 HR 70 8:00 am 175/83 HR 75 ; 10:00 pm 130/84 HR 73  7:30 am 156/87 HR 71 ;  7:00 pm 129/88 HR 81 7:30 am 135/97 HR 68 ;  10:00 pm 146/82 HR 66 8:00 am 113/94 HR 72 ;  8:30pm 127/78 HR 76 7:30 am 137/96 HR 72 ; 7:30 pm 123/83 HR 70 6:30 am 157/79 HR 70 ;  8:00pm 134/86 HR 80  11:00am123/80 HR 70 ; 10:00 pm 124/91 HR 71  8:00 am 130/82 HR 72 ; 8:30 pm 115/65 HR 73  7:30 am145/87 HR 69 ; 8:00 pm 124/77 HR 73  8:00 am 157/87 HR 74 (reading today)

## 2019-02-20 NOTE — Telephone Encounter (Signed)
Can increase Lisinopril from 2.5===> 5 mg

## 2019-02-26 MED ORDER — LISINOPRIL 5 MG PO TABS: 5.0000 mg | ORAL_TABLET | Freq: Every day | ORAL | 3 refills | Status: DC

## 2019-02-26 NOTE — Telephone Encounter (Signed)
Spoke with pt and informed her that she may increase lisinopril to 5 mg daily. Also informed pt that new Rx for 5 mg tablet would be sent to her pharmacy but she may take two 2.5 mg tablets daily until she runs out. Advised pt to monitor BP on a daily basis and may check BP in AM before meds and 1-2 hours after. Advised pt to call office in 1-2 weeks if BP remains higher than her normal. Pt verbalized understanding

## 2019-03-01 ENCOUNTER — Other Ambulatory Visit: Payer: Self-pay | Admitting: Family Medicine

## 2019-03-03 NOTE — Telephone Encounter (Signed)
Electronic refill request. Baclofen Last office visit:   09/12/2018 Last Filled:    30 tablet 2 12/05/2018  Please advise.

## 2019-03-04 NOTE — Telephone Encounter (Signed)
Sent. Thanks.   

## 2019-03-26 ENCOUNTER — Telehealth: Payer: Self-pay

## 2019-03-26 MED ORDER — SHINGRIX 50 MCG/0.5ML IM SUSR: 0.5000 mL | Freq: Once | INTRAMUSCULAR | Status: DC

## 2019-03-26 NOTE — Telephone Encounter (Signed)
Pt left v/m wanting to know if we had the new shingles shot now and should pt take the new shingles shot. Pt has CPX scheduled on 05/20/19 with Dr Damita Dunnings. Please advise.

## 2019-03-26 NOTE — Telephone Encounter (Signed)
I thought we had some in supply.  Please check to make sure.  If not in stock, then add her to the list to get this done.  It should be okay to proceed.  Thanks.

## 2019-03-27 ENCOUNTER — Encounter: Payer: Self-pay | Admitting: Internal Medicine

## 2019-03-27 ENCOUNTER — Ambulatory Visit (INDEPENDENT_AMBULATORY_CARE_PROVIDER_SITE_OTHER): Payer: Medicare Other | Admitting: Internal Medicine

## 2019-03-27 DIAGNOSIS — J302 Other seasonal allergic rhinitis: Secondary | ICD-10-CM | POA: Diagnosis not present

## 2019-03-27 NOTE — Progress Notes (Signed)
Sore thVirtual Visit via Telephone Note  I connected with Robin FieldMartha J Juarez on 03/27/19 at 11:15 AM EDT by telephone and verified that I am speaking with the correct person using two identifiers.  Location: Patient: Home Provider: Office   I discussed the limitations, risks, security and privacy concerns of performing an evaluation and management service by telephone and the availability of in person appointments. I also discussed with the patient that there may be a patient responsible charge related to this service. The patient expressed understanding and agreed to proceed.   History of Present Illness:  Pt reports runny nose, sore throat and cough. This started 2 weeks ago. She denies difficulty swallowing. The cough is productive of white/yellow mucous. She denies headache, nasal congestion, ear pain or shortness of breath. She denies fever, chills or body aches. She has tried Mucinex, Ibuprofen and Coricidin HBP with some relief. She has no history of allergies. She has not had sick contacts or exposure to COVID that she is aware of.     Past Medical History:  Diagnosis Date  . Arthritis    knee and hip OA- prev injection by Dr. Ethelene Halamos  . Blood transfusion without reported diagnosis    37 yrs ago -s/p back surgery  . Chest pain   . Coronary artery disease    50% mid LAD by cath January 2016  . Depression 1993, 2000  . Fibromyalgia   . GERD (gastroesophageal reflux disease)   . H/O hiatal hernia   . Hyperlipidemia   . Hypertension   . OSA (obstructive sleep apnea)    dx 2010-had a cpap-does not use-"uses special mouthpiece now"  . Osteoporosis   . Positive cardiac stress test   . Stroke (HCC)   . Urinary incontinence   . UTI (lower urinary tract infection)     Current Outpatient Medications  Medication Sig Dispense Refill  . Alirocumab (PRALUENT) 75 MG/ML SOAJ Inject 75 mg into the skin every 14 (fourteen) days. 6 pen 3  . Baclofen 5 MG TABS TAKE 1 TABLET (5 MG) BY  MOUTH DAILY AS NEEDED (FOR MUSCLE SPASMS). 90 tablet 1  . cholecalciferol (VITAMIN D) 1000 UNITS tablet Take 1,000 Units by mouth daily.      . Coenzyme Q10 (CO Q 10 PO) Take 1 tablet by mouth daily.    . diclofenac sodium (VOLTAREN) 1 % GEL Apply 2 g topically 4 (four) times daily as needed (for joint aches). 100 g 2  . DULoxetine (CYMBALTA) 30 MG capsule Take 90 mg by mouth daily.     . Evolocumab (REPATHA SURECLICK) 140 MG/ML SOAJ Inject 140 mg into the skin every 14 (fourteen) days. 2 pen 12  . folic acid (FOLVITE) 800 MCG tablet Take 800 mcg by mouth daily.     [name]. gabapentin (NEURONTIN) 300 MG capsule Take 300-600 mg by mouth See admin instructions. 300 mg at lunch and supper and 600 mg in the evening    . hydrocortisone 2.5 % ointment Apply 1 application topically daily as needed (rash).     [name]. KRILL OIL PO Take 100 mg by mouth daily.    [name]. lamoTRIgine (LAMICTAL) 100 MG tablet Take 100 mg by mouth at bedtime.    [name]. lisinopril (ZESTRIL) 5 MG tablet Take 1 tablet (5 mg total) by mouth daily. 90 tablet 3  . metoprolol succinate (TOPROL-XL) 25 MG 24 hr tablet Take 1 tablet (25 mg total) by mouth daily after supper.    [name]. OLANZapine (ZYPREXA) 2.5  MG tablet Take 1 tablet (2.5 mg total) by mouth at bedtime.    . polyethylene glycol (MIRALAX / GLYCOLAX) packet Take 17 g by mouth daily. 14 each 0   No current facility-administered medications for this visit.     Allergies  Allergen Reactions  . Digoxin Other (See Comments)    Felt weak, tired, "aweful"  . Nitrofurantoin Other (See Comments)    Sharp pain back of head  . Alendronate Sodium Other (See Comments)    cramps  . Cephalosporins     She "felt bad" while taking multiple cephalosporins but has been able tolerate amoxicillin previously.  No clear cephalosporin allergy.  . Chlordiazepoxide Hcl Other (See Comments)  . Pb-Hyoscy-Atropine-Scopolamine Other (See Comments)  . Ritalin [Methylphenidate Hcl]     intolerant  . Statins      Myalgia.  Intolerant of multiple statins, including pravastatin and simvastatin.  . Chlordiazepoxide Other (See Comments)  . Clavulanic Acid Other (See Comments)    Not an allergy.  Tolerates plain amoxil but not augmentin- diarrhea, etc with augmentin  . Omeprazole Rash    rash  . Oxcarbazepine Other (See Comments)    unknown  . Sulfa Antibiotics Other (See Comments)    Unsure-doesn't remember  . Tetracycline Itching    itching  . Zetia [Ezetimibe] Other (See Comments)    possible cause of aches and abnormal dreams, stopped 05/2014    Family History  Problem Relation Age of Onset  . Arthritis Mother   . Stroke Father   . Hypertension Father   . Heart disease Brother        Died during CABG  . Hypertension Sister   . Dementia Sister   . Aneurysm Sister        Brain  . Parkinsonism Sister   . Kidney disease Maternal Grandmother   . Colon cancer Neg Hx   . Breast cancer Neg Hx     Social History   Socioeconomic History  . Marital status: Married    Spouse name: Not on file  . Number of children: 4  . Years of education: Not on file  . Highest education level: Not on file  Occupational History  . Occupation: RETIRED    Employer: RETIRED  Social Needs  . Financial resource strain: Not on file  . Food insecurity    Worry: Not on file    Inability: Not on file  . Transportation needs    Medical: Not on file    Non-medical: Not on file  Tobacco Use  . Smoking status: Never Smoker  . Smokeless tobacco: Never Used  . Tobacco comment: a small amount when 85 yo  Substance and Sexual Activity  . Alcohol use: No    Alcohol/week: 0.0 standard drinks  . Drug use: No  . Sexual activity: Not Currently  Lifestyle  . Physical activity    Days per week: Not on file    Minutes per session: Not on file  . Stress: Not on file  Relationships  . Social Herbalist on phone: Not on file    Gets together: Not on file    Attends religious service: Not on file     Active member of club or organization: Not on file    Attends meetings of clubs or organizations: Not on file    Relationship status: Not on file  . Intimate partner violence    Fear of current or ex partner: Not on file  Emotionally abused: Not on file    Physically abused: Not on file    Forced sexual activity: Not on file  Other Topics Concern  . Not on file  Social History Narrative   Married 1953, husband is well.   5 pregnancies, 4 live births, all well (5th was unexpect pregnancy and patient needed to terminate due to concurrent medical problems at the time)   11 grandchildren.  3 great grandchildren   Regular exercise:  No     Constitutional: Denies fever, malaise, fatigue, headache or abrupt weight changes.  HEENT: Pt report runny nose, sore throat. Denies eye pain, eye redness, ear pain, ringing in the ears, wax buildup, runny nose, nasal congestion, bloody nose. Respiratory: Pt reports cough. Denies difficulty breathing, shortness of breath, or sputum production.   Cardiovascular: Denies chest pain, chest tightness, palpitations or swelling in the hands or feet.    No other specific complaints in a complete review of systems (except as listed in HPI above).  Observations/Objective:   Wt Readings from Last 3 Encounters:  09/25/18 191 lb 3.2 oz (86.7 kg)  09/12/18 187 lb 8 oz (85 kg)  09/04/18 186 lb 4.6 oz (84.5 kg)   Pulmonary/Chest: No apparent cough or SOB. Neurological: Alert and oriented.  BMET    Component Value Date/Time   NA 140 09/04/2018 0401   NA 144 08/01/2018 0947   K 4.4 09/04/2018 0401   CL 105 09/04/2018 0401   CO2 25 09/04/2018 0401   GLUCOSE 137 (H) 09/04/2018 0401   BUN 13 09/04/2018 0401   BUN 15 08/01/2018 0947   CREATININE 0.87 09/04/2018 0401   CREATININE 0.81 08/31/2014 1621   CALCIUM 9.2 09/04/2018 0401   CALCIUM 9.3 05/23/2010 2201   GFRNONAA >60 09/04/2018 0401   GFRAA >60 09/04/2018 0401    Lipid Panel     Component  Value Date/Time   CHOL 165 08/01/2018 0947   TRIG 133 08/01/2018 0947   HDL 70 08/01/2018 0947   CHOLHDL 2.4 08/01/2018 0947   CHOLHDL 4 09/10/2017 1122   VLDL 29.2 09/10/2017 1122   LDLCALC 68 08/01/2018 0947    CBC    Component Value Date/Time   WBC 9.3 09/04/2018 0401   RBC 4.25 09/04/2018 0401   HGB 14.2 09/04/2018 0401   HCT 43.5 09/04/2018 0401   PLT 232 09/04/2018 0401   MCV 102.4 (H) 09/04/2018 0401   MCH 33.4 09/04/2018 0401   MCHC 32.6 09/04/2018 0401   RDW 13.2 09/04/2018 0401   LYMPHSABS 2.3 09/03/2018 1844   MONOABS 0.5 09/03/2018 1844   EOSABS 0.2 09/03/2018 1844   BASOSABS 0.1 09/03/2018 1844    Hgb A1C Lab Results  Component Value Date   HGBA1C 5.1 05/27/2009       Assessment and Plan:  Allergic Rhinitis:  Start Allegra OTC Ok to continue Coricidin HBP Let us know if no improvement by next week To UC if worse over the weekend No indication for COVID testing at this time  Return precautions discussed   Follow Up Instructions:    I discussed the assessment and treatment plan with the patient. The patient was provided an opportunity to ask questions and all were answered. The patient agreed with the plan and demonstrated an understanding of the instructions.   The patient was advised to call back or seek an in-person evaluation if the symptoms worsen or if the condition fails to improve as anticipated.  I provided 6 minutes 45 secs minutes of non-face-to-face  time during this encounter.   Nicki Reaperegina Miyana Mordecai, NP

## 2019-03-27 NOTE — Telephone Encounter (Signed)
Spoke with patient. She has [employer]. Advised patient to check with pharmacy, Medicare has not been covering this injection in the office setting.

## 2019-03-27 NOTE — Patient Instructions (Signed)
Allergic Rhinitis, Adult Allergic rhinitis is a reaction to allergens in the air. Allergens are tiny specks (particles) in the air that cause your body to have an allergic reaction. This condition cannot be passed from person to person (is not contagious). Allergic rhinitis cannot be cured, but it can be controlled. There are two types of allergic rhinitis:  Seasonal. This type is also called hay fever. It happens only during certain times of the year.  Perennial. This type can happen at any time of the year. What are the causes? This condition may be caused by:  Pollen from grasses, trees, and weeds.  House dust mites.  Pet dander.  Mold. What are the signs or symptoms? Symptoms of this condition include:  Sneezing.  Runny or stuffy nose (nasal congestion).  A lot of mucus in the back of the throat (postnasal drip).  Itchy nose.  Tearing of the eyes.  Trouble sleeping.  Being sleepy during day. How is this treated? There is no cure for this condition. You should avoid things that trigger your symptoms (allergens). Treatment can help to relieve symptoms. This may include:  Medicines that block allergy symptoms, such as antihistamines. These may be given as a shot, nasal spray, or pill.  Shots that are given until your body becomes less sensitive to the allergen (desensitization).  Stronger medicines, if all other treatments have not worked. Follow these instructions at home: Avoiding allergens   Find out what you are allergic to. Common allergens include smoke, dust, and pollen.  Avoid them if you can. These are some of the things that you can do to avoid allergens: ? Replace carpet with wood, tile, or vinyl flooring. Carpet can trap dander and dust. ? Clean any mold found in the home. ? Do not smoke. Do not allow smoking in your home. ? Change your heating and air conditioning filter at least once a month. ? During allergy season:  Keep windows closed as much as  you can. If possible, use air conditioning when there is a lot of pollen in the air.  Use a special filter for allergies with your furnace and air conditioner.  Plan outdoor activities when pollen counts are lowest. This is usually during the early morning or evening hours.  If you do go outdoors when pollen count is high, wear a special mask for people with allergies.  When you come indoors, take a shower and change your clothes before sitting on furniture or bedding. General instructions  Do not use fans in your home.  Do not hang clothes outside to dry.  Wear sunglasses to keep pollen out of your eyes.  Wash your hands right away after you touch household pets.  Take over-the-counter and prescription medicines only as told by your doctor.  Keep all follow-up visits as told by your doctor. This is important. Contact a doctor if:  You have a fever.  You have a cough that does not go away (is persistent).  You start to make whistling sounds when you breathe (wheeze).  Your symptoms do not get better with treatment.  You have thick fluid coming from your nose.  You start to have nosebleeds. Get help right away if:  Your tongue or your lips are swollen.  You have trouble breathing.  You feel dizzy or you feel like you are going to pass out (faint).  You have cold sweats. Summary  Allergic rhinitis is a reaction to allergens in the air.  This condition may be   caused by allergens. These include pollen, dust mites, pet dander, and mold.  Symptoms include a runny, itchy nose, sneezing, or tearing eyes. You may also have trouble sleeping or feel sleepy during the day.  Treatment includes taking medicines and avoiding allergens. You may also get shots or take stronger medicines.  Get help if you have a fever or a cough that does not stop. Get help right away if you are short of breath. This information is not intended to replace advice given to you by your health care  provider. Make sure you discuss any questions you have with your health care provider. Document Released: 12/07/2010 Document Revised: 11/26/2018 Document Reviewed: 02/26/2018 Elsevier Patient Education  2020 Elsevier Inc.  

## 2019-03-31 ENCOUNTER — Ambulatory Visit (INDEPENDENT_AMBULATORY_CARE_PROVIDER_SITE_OTHER): Payer: Medicare Other | Admitting: Family Medicine

## 2019-03-31 ENCOUNTER — Telehealth: Payer: Self-pay | Admitting: Family Medicine

## 2019-03-31 ENCOUNTER — Telehealth: Payer: Self-pay | Admitting: Cardiovascular Disease

## 2019-03-31 VITALS — Temp 98.4°F

## 2019-03-31 DIAGNOSIS — R05 Cough: Secondary | ICD-10-CM

## 2019-03-31 DIAGNOSIS — R059 Cough, unspecified: Secondary | ICD-10-CM

## 2019-03-31 MED ORDER — AMOXICILLIN 875 MG PO TABS: 875.0000 mg | ORAL_TABLET | Freq: Two times a day (BID) | ORAL | 0 refills | Status: DC

## 2019-03-31 NOTE — Telephone Encounter (Signed)
  Pt c/o BP issue: STAT if pt c/o blurred vision, one-sided weakness or slurred speech  1. What are your last 5 BP readings?   153/77 P 79, 140/79 P 84, 130/73 P 74 143/85 P 79, 131/89 P 79    2. Are you having any other symptoms (ex. Dizziness, headache, blurred vision, passed out)? NO  3. What is your BP issue? Patient was asked to record some BP readings and call with results. Patient states she has many more readings if they are needed.

## 2019-03-31 NOTE — Telephone Encounter (Signed)
Patient did a virtual visit with Rollene Fare last week.  Patient was told to take Allegra and if it didn't work to call back.  Patient said the Allegra hasn't done anything.  Patient still has dry cough and patient coughs up "infection" after she eats in the morning.  Patient said she needs Amoxicillin called in to her pharmacy.  [facility] uses [facility] [place].

## 2019-03-31 NOTE — Telephone Encounter (Signed)
Patient's appointment with Dr.Duncan was at 10:45.

## 2019-03-31 NOTE — Telephone Encounter (Signed)
No need for appt, I can send in abx to pharmacy. If she wants to keep appt with Dr. Damita Dunnings, let me know.

## 2019-03-31 NOTE — Telephone Encounter (Signed)
Spoke with pt, will try taking the metoprolol in the evening and the lisinopril in the morning to see if that will help with elevated pressures in the morning. She will call back with readings.

## 2019-03-31 NOTE — Progress Notes (Addendum)
Interactive audio and video telecommunications were attempted between this provider and patient, however failed, due to patient having technical difficulties OR patient did not have access to video capability.  We continued and completed visit with audio only.   Virtual Visit via Telephone Note  I connected with patient on 03/31/19  at 11:21 AM  by telephone and verified that I am speaking with the correct person using two identifiers.  Location of patient: home.   Location of MD: [facility] Name of referring provider (if blank then none associated): Names per persons and role in encounter:  MD: Earlyne Iba, Patient: name listed above.    I discussed the limitations, risks, security and privacy concerns of performing an evaluation and management service by telephone and the availability of in person appointments. I also discussed with the patient that there may be a patient responsible charge related to this service. The patient expressed understanding and agreed to proceed.  CC: cough  History of Present Illness:   Sx started about 2.5-3 weeks ago with ST then, now with AM cough.  Has tried allegra in the meantime w/o relief.  Dry cough most of the time, some AM yellow sputum.  No fevers.  Overall she isn't worse but isn't getting better.  No covid exposures.  No vomiting, no diarrhea.  No rash.  No wheeze.  Not SOB.  No facial pain.  No dysphagia now but prev with some discomfort with swallowing pills.    Her husband is well.  We talked about covid testing it may make sense to defer testing for now and she agrees.  No known exposure.   Observations/Objective: No apparent distress Speech normal.  Assessment and Plan: Cough.  Start amoxil and continue allegra vs mucinex (but not allegra D vs mucinex D).  D/w pt and she agrees.  She'll update me as needed.  Okay for outpatient follow-up.  Follow Up Instructions: see above.    I discussed the assessment and treatment plan with  the patient. The patient was provided an opportunity to ask questions and all were answered. The patient agreed with the plan and demonstrated an understanding of the instructions.   The patient was advised to call back or seek an in-person evaluation if the symptoms worsen or if the condition fails to improve as anticipated.  I provided 13 minutes of non-face-to-face time during this encounter.   Elsie Stain, MD

## 2019-03-31 NOTE — Telephone Encounter (Signed)
Spouse called back schedule appointment with dr Damita Dunnings 8/10 phone visit

## 2019-04-02 DIAGNOSIS — R05 Cough: Secondary | ICD-10-CM | POA: Insufficient documentation

## 2019-04-02 DIAGNOSIS — R059 Cough, unspecified: Secondary | ICD-10-CM | POA: Insufficient documentation

## 2019-04-02 NOTE — Assessment & Plan Note (Signed)
Start amoxil and continue allegra vs mucinex (but not allegra D vs mucinex D).  D/w pt and she agrees.  She'll update me as needed.  Okay for outpatient follow-up.

## 2019-04-29 ENCOUNTER — Other Ambulatory Visit: Payer: Self-pay | Admitting: Pharmacist

## 2019-04-29 NOTE — Telephone Encounter (Signed)
Patient call for refill authorization. Her Rx refill due on 05/14/2019

## 2019-05-09 ENCOUNTER — Other Ambulatory Visit: Payer: Self-pay | Admitting: Pharmacist Clinician (PhC)/ Clinical Pharmacy Specialist

## 2019-05-09 MED ORDER — PRALUENT 75 MG/ML ~~LOC~~ SOAJ: 75.0000 mg | SUBCUTANEOUS | 3 refills | Status: DC

## 2019-05-12 ENCOUNTER — Other Ambulatory Visit: Payer: Self-pay | Admitting: Family Medicine

## 2019-05-12 ENCOUNTER — Other Ambulatory Visit (INDEPENDENT_AMBULATORY_CARE_PROVIDER_SITE_OTHER): Payer: Medicare Other

## 2019-05-12 DIAGNOSIS — M810 Age-related osteoporosis without current pathological fracture: Secondary | ICD-10-CM

## 2019-05-12 DIAGNOSIS — I10 Essential (primary) hypertension: Secondary | ICD-10-CM

## 2019-05-12 DIAGNOSIS — R5383 Other fatigue: Secondary | ICD-10-CM | POA: Diagnosis not present

## 2019-05-12 LAB — LIPID PANEL
Cholesterol: 153 mg/dL (ref 0–200)
HDL: 59 mg/dL (ref >39.00)
LDL Cholesterol: 70 mg/dL (ref 0–99)
NonHDL: 93.62
Total CHOL/HDL Ratio: 3
Triglycerides: 117 mg/dL (ref 0.0–149.0)
VLDL: 23.4 mg/dL (ref 0.0–40.0)

## 2019-05-12 LAB — COMPREHENSIVE METABOLIC PANEL
ALT: 14 U/L (ref 0–35)
AST: 19 U/L (ref 0–37)
Albumin: 4.4 g/dL (ref 3.5–5.2)
Alkaline Phosphatase: 76 U/L (ref 39–117)
BUN: 14 mg/dL (ref 6–23)
CO2: 31 mEq/L (ref 19–32)
Calcium: 9.6 mg/dL (ref 8.4–10.5)
Chloride: 105 mEq/L (ref 96–112)
Creatinine, Ser: 0.88 mg/dL (ref 0.40–1.20)
GFR: 60.94 mL/min (ref >60.00)
Glucose, Bld: 96 mg/dL (ref 70–99)
Potassium: 4.4 mEq/L (ref 3.5–5.1)
Sodium: 143 mEq/L (ref 135–145)
Total Bilirubin: 0.6 mg/dL (ref 0.2–1.2)
Total Protein: 6.7 g/dL (ref 6.0–8.3)

## 2019-05-12 LAB — VITAMIN D 25 HYDROXY (VIT D DEFICIENCY, FRACTURES): VITD: 23.49 ng/mL — ABNORMAL LOW (ref 30.00–100.00)

## 2019-05-12 LAB — TSH: TSH: 3.45 u[IU]/mL (ref 0.35–4.50)

## 2019-05-15 ENCOUNTER — Ambulatory Visit: Payer: Medicare Other

## 2019-05-20 ENCOUNTER — Ambulatory Visit (INDEPENDENT_AMBULATORY_CARE_PROVIDER_SITE_OTHER): Payer: Medicare Other | Admitting: Family Medicine

## 2019-05-20 ENCOUNTER — Encounter: Payer: Self-pay | Admitting: Family Medicine

## 2019-05-20 DIAGNOSIS — Z7189 Other specified counseling: Secondary | ICD-10-CM

## 2019-05-20 DIAGNOSIS — E559 Vitamin D deficiency, unspecified: Secondary | ICD-10-CM

## 2019-05-20 DIAGNOSIS — I10 Essential (primary) hypertension: Secondary | ICD-10-CM

## 2019-05-20 DIAGNOSIS — M545 Low back pain, unspecified: Secondary | ICD-10-CM

## 2019-05-20 DIAGNOSIS — E782 Mixed hyperlipidemia: Secondary | ICD-10-CM | POA: Diagnosis not present

## 2019-05-20 DIAGNOSIS — Z0000 Encounter for general adult medical examination without abnormal findings: Secondary | ICD-10-CM

## 2019-05-20 DIAGNOSIS — F339 Major depressive disorder, recurrent, unspecified: Secondary | ICD-10-CM

## 2019-05-20 MED ORDER — GABAPENTIN 300 MG PO CAPS: 300.0000 mg | ORAL_CAPSULE | ORAL | Status: DC

## 2019-05-20 MED ORDER — LAMOTRIGINE 100 MG PO TABS: 100.0000 mg | ORAL_TABLET | Freq: Every day | ORAL | Status: DC

## 2019-05-20 NOTE — Progress Notes (Signed)
Interactive audio and video telecommunications were attempted between this provider and patient, however failed, due to patient having technical difficulties OR patient did not have access to video capability.  We continued and completed visit with audio only.   Virtual Visit via Telephone Note  I connected with patient on 05/20/19  at 2:40 PM  by telephone and verified that I am speaking with the correct person using two identifiers.  Location of patient: home  Location of MD: [facility] Name of referring provider (if blank then none associated): Names per persons and role in encounter:  MD: Ferd Hibbs, Patient: name listed above.    I discussed the limitations, risks, security and privacy concerns of performing an evaluation and management service by telephone and the availability of in person appointments. I also discussed with the patient that there may be a patient responsible charge related to this service. The patient expressed understanding and agreed to proceed.  CC: follow up.   History of Present Illness:   Mood and insomnia per psych, seeing Donnie Aho.  She has noted recently that her mood was lower in the afternoons.  She had trouble sleeping and that led to the recent increase in gabapentin.  She is hopeful that will change.    Hypertension:    Using medication without problems or lightheadedness: yes Chest pain with exertion: no Edema: no Short of breath:no Labs d/w pt.   She is putting up with episodic back pain.  She clearly has a limit for lifting and she stays within that boundary.  No new sx.    Elevated Cholesterol: Using medications without problems: yes Muscle aches: no Diet compliance: encouraged Exercise: encouraged as tolerated.   Vit D low, even with taking 1000 IU a day.  She'll inc to 2000 IU a day.    Tetanus 2015 Flu shot to be done 05/2019.   PNA shot up to date.   shingrix d/w pt.  1st dose already done.   Colon screening  declined.  Pap not due.   DXA declined  Mammogram d/w pt.  She is considering if she would want to go through with treatment if screen were abnormal.  Advance directive d/w pt. Husband designated if patient were incapacitated. If he is also incapacitated, then her kids are equally designated.     Observations/Objective: nad Speech wnl  Assessment and Plan:  Mood and insomnia per psych, seeing Donnie Aho.  She has noted recently that her mood was lower in the afternoons.  She had trouble sleeping and that led to the recent increase in gabapentin.  She is hopeful that will change.    Hypertension:    Continue as is.  She agrees.  Labs d/w pt.   She is putting up with episodic back pain.  She clearly has a limit for lifting and she stays within that boundary.  No new sx.    Elevated Cholesterol: Using medications without problems: yes Continue as is.  She agrees.  Labs d/w pt.   Tetanus 2015 Flu shot to be done 05/2019.   PNA shot up to date.   shingrix d/w pt.  1st dose already done.   Colon screening declined.  Pap not due.   DXA declined  Mammogram d/w pt.  She is considering if she would want to go through with treatment if screen were abnormal.  Advance directive d/w pt. Husband designated if patient were incapacitated. If he is also incapacitated, then her kids are equally designated.  Vit D low, even with taking 1000 IU a day.  She'll inc to 2000 IU a day.    Follow Up Instructions: see above.   I discussed the assessment and treatment plan with the patient. The patient was provided an opportunity to ask questions and all were answered. The patient agreed with the plan and demonstrated an understanding of the instructions.   The patient was advised to call back or seek an in-person evaluation if the symptoms worsen or if the condition fails to improve as anticipated.  I provided 28 minutes of non-face-to-face time during this encounter.  Elsie Stain, MD

## 2019-05-22 ENCOUNTER — Other Ambulatory Visit: Payer: Self-pay

## 2019-05-22 ENCOUNTER — Other Ambulatory Visit: Payer: Self-pay | Admitting: Cardiovascular Disease

## 2019-05-22 ENCOUNTER — Ambulatory Visit (INDEPENDENT_AMBULATORY_CARE_PROVIDER_SITE_OTHER): Payer: Medicare Other

## 2019-05-22 DIAGNOSIS — E559 Vitamin D deficiency, unspecified: Secondary | ICD-10-CM | POA: Insufficient documentation

## 2019-05-22 DIAGNOSIS — Z0000 Encounter for general adult medical examination without abnormal findings: Secondary | ICD-10-CM | POA: Insufficient documentation

## 2019-05-22 DIAGNOSIS — Z23 Encounter for immunization: Secondary | ICD-10-CM

## 2019-05-22 NOTE — Assessment & Plan Note (Signed)
Using medications without problems: yes Continue as is.  She agrees.  Labs d/w pt.  

## 2019-05-22 NOTE — Assessment & Plan Note (Signed)
Advance directive d/w pt. Husband designated if patient were incapacitated.  If he is also incapacitated, then her kids are equally designated.   

## 2019-05-22 NOTE — Assessment & Plan Note (Signed)
Tetanus 2015 Flu shot to be done 05/2019.   PNA shot up to date.   shingrix d/w pt.  1st dose already done.   Colon screening declined.  Pap not due.   DXA declined  Mammogram d/w pt.  She is considering if she would want to go through with treatment if screen were abnormal.  Advance directive d/w pt. Husband designated if patient were incapacitated. If he is also incapacitated, then her kids are equally designated.

## 2019-05-22 NOTE — Assessment & Plan Note (Signed)
Mood and insomnia per psych, seeing Chapman Moss.  She has noted recently that her mood was lower in the afternoons.  She had trouble sleeping and that led to the recent increase in gabapentin.  She is hopeful that will change.

## 2019-05-22 NOTE — Assessment & Plan Note (Signed)
Using medications without problems: yes Continue as is.  She agrees.  Labs d/w pt.

## 2019-05-22 NOTE — Assessment & Plan Note (Signed)
  Vit D low, even with taking 1000 IU a day.  She'll inc to 2000 IU a day.

## 2019-05-22 NOTE — Assessment & Plan Note (Signed)
She is putting up with episodic back pain.  She clearly has a limit for lifting and she stays within that boundary.  No new sx.

## 2019-05-27 ENCOUNTER — Other Ambulatory Visit: Payer: Self-pay | Admitting: Cardiovascular Disease

## 2019-06-05 ENCOUNTER — Telehealth: Payer: Self-pay

## 2019-06-05 NOTE — Telephone Encounter (Signed)
Pt called in stating that they have only been getting one pen when they pick up their praluent at the pharmacy. I instructed the pt that they will need to call the pharmacy because I checked the prescription and kristin alvstad pharmd cpp wrote it for 3 mos like the pt requested. Will await a phone call from the pt.

## 2019-06-05 NOTE — Telephone Encounter (Signed)
This encounter was created in error - please disregard.

## 2019-06-23 ENCOUNTER — Ambulatory Visit: Payer: Medicare Other | Admitting: Family Medicine

## 2019-06-26 ENCOUNTER — Ambulatory Visit: Payer: Medicare Other | Admitting: Family Medicine

## 2019-07-28 ENCOUNTER — Other Ambulatory Visit: Payer: Self-pay

## 2019-07-28 MED ORDER — PRALUENT 75 MG/ML ~~LOC~~ SOAJ: 75.0000 mg | SUBCUTANEOUS | 3 refills | Status: DC

## 2019-08-05 ENCOUNTER — Encounter (HOSPITAL_COMMUNITY): Payer: Self-pay | Admitting: Emergency Medicine

## 2019-08-05 ENCOUNTER — Emergency Department (HOSPITAL_COMMUNITY): Payer: Medicare Other

## 2019-08-05 ENCOUNTER — Ambulatory Visit: Payer: Medicare Other | Admitting: Family Medicine

## 2019-08-05 ENCOUNTER — Other Ambulatory Visit: Payer: Self-pay

## 2019-08-05 ENCOUNTER — Emergency Department (HOSPITAL_COMMUNITY)
Admission: EM | Admit: 2019-08-05 | Discharge: 2019-08-05 | Disposition: A | Discharge status: Home / Self Care | Payer: Medicare Other | Attending: Emergency Medicine | Admitting: Emergency Medicine

## 2019-08-05 ENCOUNTER — Telehealth: Payer: Self-pay

## 2019-08-05 DIAGNOSIS — R109 Unspecified abdominal pain: Secondary | ICD-10-CM

## 2019-08-05 DIAGNOSIS — Z79899 Other long term (current) drug therapy: Secondary | ICD-10-CM | POA: Diagnosis not present

## 2019-08-05 DIAGNOSIS — R1031 Right lower quadrant pain: Secondary | ICD-10-CM | POA: Diagnosis present

## 2019-08-05 DIAGNOSIS — I10 Essential (primary) hypertension: Secondary | ICD-10-CM | POA: Diagnosis not present

## 2019-08-05 LAB — CBC WITH DIFFERENTIAL/PLATELET
Abs Immature Granulocytes: 0.06 10*3/uL (ref 0.00–0.07)
Basophils Absolute: 0.1 10*3/uL (ref 0.0–0.1)
Basophils Relative: 1 %
Eosinophils Absolute: 0.5 10*3/uL (ref 0.0–0.5)
Eosinophils Relative: 6 %
HCT: 44.4 % (ref 36.0–46.0)
Hemoglobin: 15 g/dL (ref 12.0–15.0)
Immature Granulocytes: 1 %
Lymphocytes Relative: 25 %
Lymphs Abs: 2.1 10*3/uL (ref 0.7–4.0)
MCH: 33 pg (ref 26.0–34.0)
MCHC: 33.8 g/dL (ref 30.0–36.0)
MCV: 97.8 fL (ref 80.0–100.0)
Monocytes Absolute: 0.5 10*3/uL (ref 0.1–1.0)
Monocytes Relative: 7 %
Neutro Abs: 5 10*3/uL (ref 1.7–7.7)
Neutrophils Relative %: 60 %
Platelets: 240 10*3/uL (ref 150–400)
RBC: 4.54 MIL/uL (ref 3.87–5.11)
RDW: 12.9 % (ref 11.5–15.5)
WBC: 8.3 10*3/uL (ref 4.0–10.5)
nRBC: 0 % (ref 0.0–0.2)

## 2019-08-05 LAB — COMPREHENSIVE METABOLIC PANEL
ALT: 20 U/L (ref 0–44)
AST: 30 U/L (ref 15–41)
Albumin: 4 g/dL (ref 3.5–5.0)
Alkaline Phosphatase: 73 U/L (ref 38–126)
Anion gap: 11 (ref 5–15)
BUN: 10 mg/dL (ref 8–23)
CO2: 25 mmol/L (ref 22–32)
Calcium: 9.5 mg/dL (ref 8.9–10.3)
Chloride: 104 mmol/L (ref 98–111)
Creatinine, Ser: 0.8 mg/dL (ref 0.44–1.00)
GFR calc Af Amer: >60 mL/min (ref >60)
GFR calc non Af Amer: >60 mL/min (ref >60)
Glucose, Bld: 97 mg/dL (ref 70–99)
Potassium: 4.6 mmol/L (ref 3.5–5.1)
Sodium: 140 mmol/L (ref 135–145)
Total Bilirubin: 0.4 mg/dL (ref 0.3–1.2)
Total Protein: 6.7 g/dL (ref 6.5–8.1)

## 2019-08-05 LAB — URINALYSIS, ROUTINE W REFLEX MICROSCOPIC
Bilirubin Urine: NEGATIVE
Glucose, UA: NEGATIVE mg/dL
Hgb urine dipstick: NEGATIVE
Ketones, ur: NEGATIVE mg/dL
Leukocytes,Ua: NEGATIVE
Nitrite: NEGATIVE
Protein, ur: NEGATIVE mg/dL
Specific Gravity, Urine: 1.011 (ref 1.005–1.030)
pH: 6 (ref 5.0–8.0)

## 2019-08-05 MED ORDER — PREDNISONE 10 MG PO TABS: ORAL_TABLET | ORAL | 0 refills | Status: DC

## 2019-08-05 NOTE — Telephone Encounter (Signed)
Noted. Will await ER notes.  Thanks.  

## 2019-08-05 NOTE — Discharge Instructions (Addendum)
Test showed no life-threatening condition.  [facility] [place].  Could also take your muscle relaxer.  Follow-up your primary care doctor.

## 2019-08-05 NOTE — ED Triage Notes (Signed)
Pt BIB [facility] from home. Pt complaining of right sided flank pain that has been ongoing for about 3 weeks. Pt complains of generalized weakness this morning and felt like she was going to pass out. VSS. NAD.

## 2019-08-05 NOTE — ED Provider Notes (Addendum)
[facility] EMERGENCY DEPARTMENT Provider Note   CSN: [id number] Arrival date & time: 08/05/19  1014     History Chief Complaint  Patient presents with  . Flank Pain    right sided    Robin Juarez is a 85 y.o. female.  Intermittent right flank pain radiating to the right lower quadrant for the past 3 weeks.  No dysuria, hematuria, fever, sweats, chills, history of kidney stones.  Similar symptoms 1 year ago which were related to her "lower back".  No bowel or bladder incontinence.  No trauma.  Position and palpation make pain worse.        Past Medical History:  Diagnosis Date  . Arthritis    knee and hip OA- prev injection by Dr. Nelva Bush  . Blood transfusion without reported diagnosis    37 yrs ago -s/p back surgery  . Chest pain   . Coronary artery disease    50% mid LAD by cath January 2016  . Depression 1993, 2000  . Fibromyalgia   . GERD (gastroesophageal reflux disease)   . H/O hiatal hernia   . Hyperlipidemia   . Hypertension   . OSA (obstructive sleep apnea)    off tx as of 2020, no sx as of 2020  . Osteoporosis   . Positive cardiac stress test   . Stroke ([place])   . Urinary incontinence   . UTI (lower urinary tract infection)     Patient Active Problem List   Diagnosis Date Noted  . Health care maintenance 05/22/2019  . Vitamin D deficiency 05/22/2019  . Cough 04/02/2019  . Chest pain 06/14/2017  . Left hip pain 12/20/2016  . Skin change 03/03/2016  . Leg length discrepancy 11/19/2014  . Dysuria 10/25/2014  . Positive cardiac stress test   . Essential hypertension 08/19/2014  . Atypical chest pain 08/18/2014  . CVA (cerebral infarction) 08/18/2014  . Balance problem 07/14/2014  . Elevated blood pressure (not hypertension) 06/10/2014  . Advance care planning 03/02/2014  . Spinal stenosis, lumbar region, with neurogenic claudication 05/01/2013  . Low back pain 04/17/2013  . Paresthesia 08/21/2012  . Tremor 08/21/2012  .  OSA (obstructive sleep apnea) 08/07/2012  . Medicare annual wellness visit, subsequent 01/12/2012  . Thumb pain 08/25/2011  . Fatigue 04/27/2011  . HYPERGLYCEMIA, MILD 09/12/2010  . DISORDER OF BONE AND CARTILAGE UNSPECIFIED 05/23/2010  . Anxiety state 03/01/2010  . ESOPHAGEAL SPASM 03/01/2010  . HIATAL HERNIA WITH REFLUX 03/01/2010  . GENERALIZED OSTEOARTHROSIS UNSPECIFIED SITE 03/01/2010  . RHEUMATISM UNSPECIFIED AND FIBROSITIS 03/01/2010  . Myalgia 03/01/2010  . OSTEOPOROSIS 03/01/2010  . HLD (hyperlipidemia) 02/25/2010  . MDD (major depressive disorder) 02/25/2010  . GERD 02/25/2010  . URINARY INCONTINENCE 02/25/2010  . UTI'S, HX OF 02/25/2010    Past Surgical History:  Procedure Laterality Date  . ABDOMINAL HYSTERECTOMY  1976  . APPENDECTOMY  1964  . Back fusions  1977   missing vertebra  . BACK SURGERY     fusions lumbar area  . BREAST BIOPSY  1995   x 2  . BREAST BIOPSY  07/10/2012   Procedure: BREAST BIOPSY WITH NEEDLE LOCALIZATION;  Surgeon: Rolm Bookbinder, MD;  Location: [facility];  Service: General;  Laterality: Right;  . CARDIOVASCULAR STRESS TEST  2009   Normal, Dr. Radford Pax  . CATARACT EXTRACTION, BILATERAL Bilateral 2005  . Childbirth  [id number]  . CYSTOSCOPY N/A 11/25/2012   Procedure: CYSTOSCOPY;  Surgeon: Reece Packer, MD;  Location: [facility];  Service: Urology;  Laterality: N/A;  . dilatation and currettage  1964  . LEFT HEART CATH AND CORONARY ANGIOGRAPHY N/A 06/15/2017   Procedure: LEFT HEART CATH AND CORONARY ANGIOGRAPHY;  Surgeon: Troy Sine, MD;  Location: [facility];  Service: Cardiovascular;  Laterality: N/A;  . LEFT HEART CATHETERIZATION WITH CORONARY ANGIOGRAM N/A 09/03/2014   Procedure: LEFT HEART CATHETERIZATION WITH CORONARY ANGIOGRAM;  Surgeon: Lorretta Harp, MD;  Location[facility] C[facility];  Service: Cardiovascular;  Laterality: N/A;  . LUMBAR LAMINECTOMY/DECOMPRESSION  MICRODISCECTOMY N/A 05/01/2013   Procedure: COMPLETE DECOMPRESSIVE LUMBAR LAMINECTOMY L3-L4 MICRODISCECTOMY L3-L4 ON THE LEFT;  Surgeon: Tobi Bastos, MD;  Location: [facility];  Service: Orthopedics;  Laterality: N/A;  . OVARIAN CYST REMOVAL  1964  . PUBOVAGINAL SLING N/A 11/25/2012   Procedure: Gaynelle Arabian;  Surgeon: Reece Packer, MD;  Location: [facility];  Service: Urology;  Laterality: N/A;     OB History   No obstetric history on file.     Family History  Problem Relation Age of Onset  . Arthritis Mother   . Stroke Father   . Hypertension Father   . Heart disease Brother        Died during CABG  . Hypertension Sister   . Dementia Sister   . Aneurysm Sister        Brain  . Parkinsonism Sister   . Kidney disease Maternal Grandmother   . Colon cancer Neg Hx   . Breast cancer Neg Hx     Social History   Tobacco Use  . Smoking status: Never Smoker  . Smokeless tobacco: Never Used  . Tobacco comment: a small amount when 85 yo  Substance Use Topics  . Alcohol use: No    Alcohol/week: 0.0 standard drinks  . Drug use: No    Home Medications Prior to Admission medications   Medication Sig Start Date End Date Taking? Authorizing Provider  Alirocumab (PRALUENT) 75 MG/ML SOAJ Inject 75 mg into the skin every 14 (fourteen) days. 07/28/19   Lorretta Harp, MD  Baclofen 5 MG TABS TAKE 1 TABLET (5 MG) BY MOUTH DAILY AS NEEDED (FOR MUSCLE SPASMS). 03/04/19   Tonia Ghent, MD  cholecalciferol (VITAMIN D) 1000 UNITS tablet Take 2,000 Units by mouth daily.     [provider]  Coenzyme Q10 (CO Q 10 PO) Take 1 tablet by mouth daily.    [provider]  diclofenac sodium (VOLTAREN) 1 % GEL Apply 2 g topically 4 (four) times daily as needed (for joint aches). 07/23/17   Tonia Ghent, MD  DULoxetine (CYMBALTA) 30 MG capsule Take 90 mg by mouth daily.     [provider]  folic acid (FOLVITE) 482 MCG tablet Take 800 mcg by  mouth daily.     [provider]  gabapentin (NEURONTIN) 300 MG capsule Take 1-3 capsules (300-900 mg total) by mouth See admin instructions. 300 mg at lunch and supper and 900 mg in the evening 05/20/19   Tonia Ghent, MD  KRILL OIL PO Take 100 mg by mouth daily.    [provider]  lamoTRIgine (LAMICTAL) 100 MG tablet Take 1 tablet (100 mg total) by mouth daily after supper. 05/20/19   Tonia Ghent, MD  lisinopril (ZESTRIL) 5 MG tablet Take 1 tablet (5 mg total) by mouth daily. 02/26/19   Lorretta Harp, MD  metoprolol succinate (TOPROL-XL) 25 MG 24 hr tablet TAKE 1 TABLET  BY MOUTH EVERY DAY 05/27/19   Lorretta Harp, MD  OLANZapine (ZYPREXA) 2.5 MG tablet Take 1 tablet (2.5 mg total) by mouth at bedtime. 08/05/15   Tonia Ghent, MD  predniSONE (DELTASONE) 10 MG tablet 5 tabs, 4 tabs, 3 tabs, 2 tabs, 1 tab daily taper 08/05/19   Nat Christen, MD    Allergies    Digoxin, Nitrofurantoin, Alendronate sodium, Cephalosporins, Chlordiazepoxide hcl, Phenobarbital-belladonna alk, Ritalin [methylphenidate hcl], Statins, Chlordiazepoxide, Clavulanic acid, Omeprazole, Oxcarbazepine, Sulfa antibiotics, Tetracycline, and Zetia [ezetimibe]  Review of Systems   Review of Systems  All other systems reviewed and are negative.   Physical Exam Updated Vital Signs BP 107/74   Pulse 73   Temp 98 F (36.7 C) (Oral)   Resp 19   Ht 5' (1.524 m)   Wt 81.6 kg   SpO2 94%   BMI 35.15 kg/m   Physical Exam Vitals and nursing note reviewed.  Constitutional:      Appearance: She is well-developed.  HENT:     Head: Normocephalic and atraumatic.  Eyes:     Conjunctiva/sclera: Conjunctivae normal.  Cardiovascular:     Rate and Rhythm: Normal rate and regular rhythm.  Pulmonary:     Effort: Pulmonary effort is normal.     Breath sounds: Normal breath sounds.  Abdominal:     General: Bowel sounds are normal.     Palpations: Abdomen is soft.  Genitourinary:    Comments:  Minimal right flank tenderness. Musculoskeletal:        General: Normal range of motion.     Cervical back: Neck supple.  Skin:    General: Skin is warm and dry.  Neurological:     General: No focal deficit present.     Mental Status: She is alert and oriented to person, place, and time.  Psychiatric:        Behavior: Behavior normal.     ED Results / Procedures / Treatments   Labs (all labs ordered are listed, but only abnormal results are displayed) Labs Reviewed  CBC WITH DIFFERENTIAL/PLATELET  COMPREHENSIVE METABOLIC PANEL  URINALYSIS, ROUTINE W REFLEX MICROSCOPIC    EKG EKG Interpretation  Date/Time:  Tuesday August 05 2019 10:17:44 EST Ventricular Rate:  77 PR Interval:    QRS Duration: 91 QT Interval:  385 QTC Calculation: 436 R Axis:   32 Text Interpretation: Sinus rhythm Confirmed by Nat Christen [phone]) on 08/05/2019 10:33:03 AM   Radiology CT Renal Stone Study  Result Date: 08/05/2019 CLINICAL DATA:  Acute right flank pain. EXAM: CT ABDOMEN AND PELVIS WITHOUT CONTRAST TECHNIQUE: Multidetector CT imaging of the abdomen and pelvis was performed following the standard protocol without IV contrast. COMPARISON:  September 03, 2018. FINDINGS: Lower chest: No acute abnormality. Hepatobiliary: Solitary gallstone is noted in neck of gallbladder. No biliary dilatation is noted. The liver is unremarkable on these unenhanced images. Pancreas: Unremarkable. No pancreatic ductal dilatation or surrounding inflammatory changes. Spleen: Normal in size without focal abnormality. Adrenals/Urinary Tract: Adrenal glands appear normal. Stable left renal cyst is noted. No hydronephrosis or renal obstruction is noted. No renal or ureteral calculi are noted. Urinary bladder is unremarkable. Stomach/Bowel: The stomach appears normal. There is no evidence of bowel obstruction. Sigmoid diverticulosis is noted without inflammation. Status post appendectomy. Vascular/Lymphatic: Aortic  atherosclerosis. No enlarged abdominal or pelvic lymph nodes. Reproductive: Status post hysterectomy. No adnexal masses. Other: No abdominal wall hernia or abnormality. No abdominopelvic ascites. Musculoskeletal: Grade 2 anterolisthesis of L5-S1 is noted due to bilateral  L5 spondylolysis. Multilevel degenerative disc disease is noted in the lumbar spine. No acute osseous abnormality is noted. IMPRESSION: 1. Solitary gallstone is noted in neck of gallbladder. 2. Aortic atherosclerosis. 3. Sigmoid diverticulosis without inflammation. 4. Grade 2 anterolisthesis of L5-S1 due to bilateral L5 spondylolysis. 5. No other significant abnormality seen in the abdomen or pelvis. Aortic Atherosclerosis (ICD10-I70.0). Electronically Signed   By: Marijo Conception M.D.   On: 08/05/2019 12:08    Procedures Procedures (including critical care time)  Medications Ordered in ED Medications - No data to display  ED Course  I have reviewed the triage vital signs and the nursing notes.  Pertinent labs & imaging results that were available during my care of the patient were reviewed by me and considered in my medical decision making (see chart for details).    MDM Rules/Calculators/A&P                      Chief complaint right flank pain.  Could be a kidney stone, but I doubt it.  Basic labs, UA, CT renal.  1415:   Patient rechecked prior to discharge.  Vital signs are stable.  Explained test results.  Will discharge on prednisone.  She has muscle relaxers at home.  Primary care follow-up. Final Clinical Impression(s) / ED Diagnoses Final diagnoses:  Right flank pain    Rx / DC Orders ED Discharge Orders         Ordered    predniSONE (DELTASONE) 10 MG tablet     08/05/19 1518           Nat Christen, MD 08/05/19 1307    Nat Christen, MD 08/06/19 1255

## 2019-08-05 NOTE — ED Notes (Signed)
Patient Alert and oriented to baseline. Stable and ambulatory to baseline. Patient verbalized understanding of the discharge instructions.  Patient belongings were taken by the patient.   

## 2019-08-05 NOTE — Telephone Encounter (Signed)
Pt said for several weeks pt has had soreness in lower rt side; last 2-3 days has severe rt sided pain and last night she hurt so bad she could not move. No fever, no Urinary symptoms.Pt has no covid symptoms except rt side pain per pt, no travel and no known exposure to + covid. Pt said 08/2018 pt had similar symptoms and was admitted to hospital and muscle relaxants and steroids took care of the pain. Pt does not want to go to ED or UC due to covid exposure. Pt scheduled virtual appt with Dr Damita Dunnings today at 12:30. Pt could not do a virtual appt due to no mike on computer and has a flip phone. Was going to talk with pt about doing an in office visit and pt had to sit down due to weakness all over. The husband said she does not feel right. No CP or difficulty breathing. I asked if I needed to call 911 and was advised yes. I called 911 EMS is on the way and Mr Ulrich said he is going to help pt put on her gown and robe and will call 911 if condition worsens prior to EMS arrival. Pt will go to [facility] ED.

## 2019-08-05 NOTE — ED Notes (Signed)
Patient transported to CT 

## 2019-08-06 NOTE — Telephone Encounter (Signed)
Pt notified as instructed and pt voiced understanding. Pt will cb if does not feel better or condition worsens.FYI to Dr Damita Dunnings.

## 2019-08-06 NOTE — Telephone Encounter (Signed)
Noted. Thanks.

## 2019-08-06 NOTE — Telephone Encounter (Signed)
She can take the baclofen daily as needed. The prednisone is to be tapered.  She starts with 5 pills a day and takes 1 less pill per day.  5 on day 1, 4 on day 2, 3 on day 3, etc.  She needs to take it with food.  If she wants to spread the dose out through the day that is okay.  Otherwise she can take the total daily dose at 1 time. Please let me know if she is not getting better.  Thanks.

## 2019-08-06 NOTE — Telephone Encounter (Signed)
Pt called wanting to know how to take her medication they gave her at the er  Prednisone she has taken to 2 this morning and is going to take 2 more later is this ok  She took 5 pills last night all at the same time  How often does she needs to take baclofen  Best number [phone]   Pt scheduled er follow up with dr Damita Dunnings 12/22

## 2019-08-07 ENCOUNTER — Telehealth: Payer: Self-pay

## 2019-08-07 ENCOUNTER — Telehealth: Payer: Self-pay | Admitting: Pharmacist

## 2019-08-07 NOTE — Telephone Encounter (Signed)
Error

## 2019-08-07 NOTE — Telephone Encounter (Signed)
Patient has recent visit to Emergency Room with right flank pain. She  interested in discussing back.flank pain as potential ADR to Praluent.   Patient had similar pain in January. Pain resolved without stopping Praluent. Emergency room doctor believes her pain is muscle related. She will follow up with PCP tomorrow for further assessment.   Recommendation:  Continue all medication as prescribed. Okay to HOLD praleunt for up to 4 weeks if needed to re-assess pain treatment. Patient will call back after taking to DR Damita Dunnings.

## 2019-08-07 NOTE — Telephone Encounter (Signed)
Pt called back and since taking her med the soreness in her side is a little bit better and pt wants to keep the Tues appt and cancel the Fri appt. I advised pt the Tues appt was cancelled when the Fri appt was made and I am glad she is feeling a little bit better but if she cancels the Fri appt and her pain is worse tonight or tomorrow the Fri appt could be gone. Pt said then maybe she needs to keep the Fri. Appt. Advised pt sounds good and nothing further needed.

## 2019-08-07 NOTE — Telephone Encounter (Signed)
Pt is calling back that the soreness in her side is no better and pt has been taking her med as instructed. Pt said she is in no distress but wants to be seen before the weekend. Pt schedule in office appt with Dr Damita Dunnings on 08/08/19 @ 11;30. UC & ED precautions given and pt voiced understanding. FYI to Dr Damita Dunnings.

## 2019-08-08 ENCOUNTER — Other Ambulatory Visit: Payer: Self-pay

## 2019-08-08 ENCOUNTER — Encounter: Payer: Self-pay | Admitting: Family Medicine

## 2019-08-08 ENCOUNTER — Ambulatory Visit (INDEPENDENT_AMBULATORY_CARE_PROVIDER_SITE_OTHER): Payer: Medicare Other | Admitting: Family Medicine

## 2019-08-08 DIAGNOSIS — M545 Low back pain, unspecified: Secondary | ICD-10-CM

## 2019-08-08 MED ORDER — BACLOFEN 5 MG PO TABS: 2.5000 mg | ORAL_TABLET | Freq: Two times a day (BID) | ORAL | Status: DC | PRN

## 2019-08-08 MED ORDER — PREDNISONE 10 MG PO TABS: ORAL_TABLET | ORAL | 0 refills | Status: DC

## 2019-08-08 NOTE — Progress Notes (Signed)
This visit occurred during the SARS-CoV-2 public health emergency.  Safety protocols were in place, including screening questions prior to the visit, additional usage of staff PPE, and extensive cleaning of exam room while observing appropriate contact time as indicated for disinfecting solutions.  Recheck pulse 98%.    She went to ER after sig pain.  "If I get in a certain position, I get a catch and it hurts."  Pain putting on her socks this AM.  She has 1 pill of prednisone left for tomorrow, she took 2 tabs today.  She is able to bear weight today.    She thought baclofen helped.  No ADE on med.  She feels better in the afternoon after taking baclofen in the AM.  No ADE on prednisone, d/w pt.    R sided pain, no L sided sx.  No rash.  No bruising.  No B/B sx.  No fevers, no chills.  She is careful with lifting.  No radicular pain down the R leg but it does wrap around the R lower abdomen w/o crossing the midline.  Prev CT with  1. Solitary gallstone is noted in neck of gallbladder. 2. Aortic atherosclerosis. 3. Sigmoid diverticulosis without inflammation. 4. Grade 2 anterolisthesis of L5-S1 due to bilateral L5 spondylolysis. 5. No other significant abnormality seen in the abdomen or pelvis.  Meds, vitals, and allergies reviewed.   ROS: Per HPI unless specifically indicated in ROS section   GEN: nad, alert and oriented HEENT: ncat NECK: supple w/o LA CV: rrr PULM: ctab, no inc wob ABD: soft, +bs EXT: no edema SKIN: no acute rash R lower back ttp w/o rash.  No dermatomal hypersensitivity.

## 2019-08-08 NOTE — Patient Instructions (Signed)
Gradually increase the dose of baclofen and update me as needed.  If needed, restart prednisone.  Take care.  Glad to see you.

## 2019-08-08 NOTE — Telephone Encounter (Signed)
Noted. Thanks.

## 2019-08-10 NOTE — Assessment & Plan Note (Signed)
Still okay for outpatient follow-up. Reasonable to gradually increase the dose of baclofen and update me as needed.  Sedation caution given. If needed, restart prednisone.  Routine steroid cautions discussed.  She agrees. She is some better in the meantime.

## 2019-08-12 ENCOUNTER — Ambulatory Visit: Payer: Medicare Other | Admitting: Family Medicine

## 2019-08-29 ENCOUNTER — Other Ambulatory Visit: Payer: Self-pay

## 2019-08-29 NOTE — Telephone Encounter (Signed)
refill 

## 2019-09-01 ENCOUNTER — Other Ambulatory Visit: Payer: Self-pay

## 2019-09-01 MED ORDER — PRALUENT 75 MG/ML ~~LOC~~ SOAJ: 75.0000 mg | SUBCUTANEOUS | 3 refills | Status: DC

## 2019-09-16 ENCOUNTER — Ambulatory Visit: Payer: Medicare Other | Admitting: Family Medicine

## 2019-12-23 ENCOUNTER — Ambulatory Visit: Payer: Medicare Other | Admitting: Family Medicine

## 2020-01-02 ENCOUNTER — Other Ambulatory Visit: Payer: Self-pay

## 2020-01-02 ENCOUNTER — Encounter (HOSPITAL_COMMUNITY): Payer: Self-pay | Admitting: Emergency Medicine

## 2020-01-02 ENCOUNTER — Telehealth: Payer: Self-pay

## 2020-01-02 ENCOUNTER — Emergency Department (HOSPITAL_COMMUNITY): Payer: Medicare Other

## 2020-01-02 ENCOUNTER — Emergency Department (HOSPITAL_COMMUNITY)
Admission: EM | Admit: 2020-01-02 | Discharge: 2020-01-02 | Disposition: A | Discharge status: Home / Self Care | Payer: Medicare Other | Attending: Emergency Medicine | Admitting: Emergency Medicine

## 2020-01-02 DIAGNOSIS — I10 Essential (primary) hypertension: Secondary | ICD-10-CM | POA: Diagnosis not present

## 2020-01-02 DIAGNOSIS — Z8673 Personal history of transient ischemic attack (TIA), and cerebral infarction without residual deficits: Secondary | ICD-10-CM | POA: Diagnosis not present

## 2020-01-02 DIAGNOSIS — Z79899 Other long term (current) drug therapy: Secondary | ICD-10-CM | POA: Diagnosis not present

## 2020-01-02 DIAGNOSIS — R531 Weakness: Secondary | ICD-10-CM | POA: Diagnosis present

## 2020-01-02 LAB — BASIC METABOLIC PANEL
Anion gap: 12 (ref 5–15)
BUN: 14 mg/dL (ref 8–23)
CO2: 26 mmol/L (ref 22–32)
Calcium: 9.5 mg/dL (ref 8.9–10.3)
Chloride: 100 mmol/L (ref 98–111)
Creatinine, Ser: 0.91 mg/dL (ref 0.44–1.00)
GFR calc Af Amer: >60 mL/min (ref >60)
GFR calc non Af Amer: 57 mL/min — ABNORMAL LOW (ref >60)
Glucose, Bld: 97 mg/dL (ref 70–99)
Potassium: 4.5 mmol/L (ref 3.5–5.1)
Sodium: 138 mmol/L (ref 135–145)

## 2020-01-02 LAB — URINALYSIS, ROUTINE W REFLEX MICROSCOPIC
Bilirubin Urine: NEGATIVE
Glucose, UA: NEGATIVE mg/dL
Hgb urine dipstick: NEGATIVE
Ketones, ur: NEGATIVE mg/dL
Leukocytes,Ua: NEGATIVE
Nitrite: NEGATIVE
Protein, ur: NEGATIVE mg/dL
Specific Gravity, Urine: 1.011 (ref 1.005–1.030)
pH: 7 (ref 5.0–8.0)

## 2020-01-02 LAB — CBC
HCT: 47.3 % — ABNORMAL HIGH (ref 36.0–46.0)
Hemoglobin: 15.2 g/dL — ABNORMAL HIGH (ref 12.0–15.0)
MCH: 32.2 pg (ref 26.0–34.0)
MCHC: 32.1 g/dL (ref 30.0–36.0)
MCV: 100.2 fL — ABNORMAL HIGH (ref 80.0–100.0)
Platelets: 296 10*3/uL (ref 150–400)
RBC: 4.72 MIL/uL (ref 3.87–5.11)
RDW: 13.3 % (ref 11.5–15.5)
WBC: 12.6 10*3/uL — ABNORMAL HIGH (ref 4.0–10.5)
nRBC: 0 % (ref 0.0–0.2)

## 2020-01-02 LAB — CBG MONITORING, ED: Glucose-Capillary: 124 mg/dL — ABNORMAL HIGH (ref 70–99)

## 2020-01-02 NOTE — Telephone Encounter (Signed)
[facility] - Client TELEPHONE ADVICE RECORD [facility] Patient Name: Robin Juarez Gender: Female DOB: 1931-04-16 Age: 86 Y 67 M 13 D Return Phone Number: [phone] (Primary) Address: City/State/Zip: [place] [address] Client [facility] - Client Client Site [facility] - Day Physician Renford Dills - MD Contact Type Call Who Is Calling Patient / Member / Family / Caregiver Call Type Triage / Clinical Relationship To Patient Self Return Phone Number [phone] (Primary) Chief Complaint Weakness, Generalized Reason for Call Symptomatic / Request for Health Information Initial Comment RN from office needs one of our nurses to triage, pt had confusion(scrambled) and weakness and sore suddenly, started prednisone the next day and it did not help, has extreme weakness since, been going on since last week. Michela Pitcher it is worse today than any other day. [place] Translation No Nurse Assessment Nurse: Shelly Coss Date/Time Eilene Ghazi Time): 01/01/2020 5:05:30 PM Confirm and document reason for call. If symptomatic, describe symptoms. ---Caller states she had weakness that started last week. She saw dr last week. She has weakness and sore suddenly. She started prednisone last week and it did not help. She has had extreme weakness since. Michela Pitcher it is worse today than any other day. Has the patient had close contact with a person known or suspected to have the novel coronavirus illness OR traveled / lives in area with major community spread (including international travel) in the last 14 days from the onset of symptoms? * If Asymptomatic, screen for exposure and travel within the last 14 days. ---No Does the patient have any new or worsening symptoms? ---Yes Will a triage be completed? ---Yes Related visit to physician within the last 2 weeks? ---Yes Does the PT have any  chronic conditions? (i.e. diabetes, asthma, this includes High risk factors for pregnancy, etc.) ---Yes List chronic conditions. ---HTN Is this a behavioral health or substance abuse call? ---No PLEASE NOTE: All timestamps contained within this report are represented as [place] Standard Time. CONFIDENTIALTY NOTICE: This fax transmission is intended only for the addressee. It contains information that is legally privileged, confidential or otherwise protected from use or disclosure. If you are not the intended recipient, you are strictly prohibited from reviewing, disclosing, copying using or disseminating any of this information or taking any action in reliance on or regarding this information. If you have received this fax in error, please notify us immediately by telephone so that we can arrange for its return to [place]. Phone: [phone], Toll-Free: [phone], Fax: [phone] Page: 2 of 2 Call Id: [id number] Guidelines Guideline Title Affirmed Question Affirmed Notes Nurse Date/Time Eilene Ghazi Time) Weakness (Generalized) and Fatigue [1] MODERATE weakness (i.e., interferes with work, school, normal activities) AND [2] cause unknown (Exceptions: weakness with acute minor illness, or weakness from poor fluid intake) Shelly Coss 01/01/2020 5:08:55 PM Disp. Time Eilene Ghazi Time) Disposition Final User 01/01/2020 5:20:39 PM See HCP within 4 Hours (or PCP triage) Yes [place] Disagree/Comply Comply Caller Understands Yes PreDisposition Call Doctor Care Advice Given Per Guideline SEE HCP WITHIN 4 HOURS (OR PCP TRIAGE): * IF OFFICE WILL BE OPEN: You need to be seen within the next 3 or 4 hours. Call your doctor (or NP/PA) now or as soon as the office opens. BRING MEDICINES: * Please bring a list of your current medicines when you go to see the doctor. CALL BACK IF: * You become worse. Comments User: Shelly Coss Date/Time Eilene Ghazi  Time): 01/01/2020 5:21:36 PM Pt  checked bp while on the phone and it was 151/80 sitting and 75 pulse. Referrals GO TO FACILITY OTHER - SPECIFY

## 2020-01-02 NOTE — Telephone Encounter (Signed)
Noted.  Will await the ER notes.  Thanks.

## 2020-01-02 NOTE — ED Provider Notes (Signed)
[facility] EMERGENCY DEPARTMENT Provider Note   CSN: [id number] Arrival date & time: 01/02/20  7425     History Chief Complaint  Patient presents with  . Weakness    Robin Juarez is a 86 y.o. female.  HPI Patient presents for evaluation of ongoing weakness, at least a week.  She was in the ED yesterday but left prior to being evaluated.  The patient is worried that she has had a stroke because 9 days ago she was at home and had a sudden feeling of difficulty thinking, moving and walking, that lasted about 24 hours.  She describes this as "a funny feeling in my head."  Since that time she has had generalized whole body weakness, arms and legs, characterized by some difficulty getting around and doing usual activities.  She has had periods of constipation but not currently.  She denies dysuria.  She denies fever, chills, cough, chest pain, abdominal pain.  She is taking her usual medications.  She denies anorexia.  There are no other known modifying factors.    Past Medical History:  Diagnosis Date  . Arthritis    knee and hip OA- prev injection by Dr. Nelva Bush  . Blood transfusion without reported diagnosis    37 yrs ago -s/p back surgery  . Chest pain   . Coronary artery disease    50% mid LAD by cath January 2016  . Depression 1993, 2000  . Fibromyalgia   . GERD (gastroesophageal reflux disease)   . H/O hiatal hernia   . Hyperlipidemia   . Hypertension   . OSA (obstructive sleep apnea)    off tx as of 2020, no sx as of 2020  . Osteoporosis   . Positive cardiac stress test   . Stroke ([place])   . Urinary incontinence   . UTI (lower urinary tract infection)     Patient Active Problem List   Diagnosis Date Noted  . Health care maintenance 05/22/2019  . Vitamin D deficiency 05/22/2019  . Cough 04/02/2019  . Chest pain 06/14/2017  . Left hip pain 12/20/2016  . Skin change 03/03/2016  . Leg length discrepancy 11/19/2014  . Dysuria 10/25/2014  .  Positive cardiac stress test   . Essential hypertension 08/19/2014  . Atypical chest pain 08/18/2014  . CVA (cerebral infarction) 08/18/2014  . Balance problem 07/14/2014  . Elevated blood pressure (not hypertension) 06/10/2014  . Advance care planning 03/02/2014  . Spinal stenosis, lumbar region, with neurogenic claudication 05/01/2013  . Low back pain 04/17/2013  . Paresthesia 08/21/2012  . Tremor 08/21/2012  . OSA (obstructive sleep apnea) 08/07/2012  . Medicare annual wellness visit, subsequent 01/12/2012  . Thumb pain 08/25/2011  . Fatigue 04/27/2011  . HYPERGLYCEMIA, MILD 09/12/2010  . DISORDER OF BONE AND CARTILAGE UNSPECIFIED 05/23/2010  . Anxiety state 03/01/2010  . ESOPHAGEAL SPASM 03/01/2010  . HIATAL HERNIA WITH REFLUX 03/01/2010  . GENERALIZED OSTEOARTHROSIS UNSPECIFIED SITE 03/01/2010  . RHEUMATISM UNSPECIFIED AND FIBROSITIS 03/01/2010  . Myalgia 03/01/2010  . OSTEOPOROSIS 03/01/2010  . HLD (hyperlipidemia) 02/25/2010  . MDD (major depressive disorder) 02/25/2010  . GERD 02/25/2010  . URINARY INCONTINENCE 02/25/2010  . UTI'S, HX OF 02/25/2010    Past Surgical History:  Procedure Laterality Date  . ABDOMINAL HYSTERECTOMY  1976  . APPENDECTOMY  1964  . Back fusions  1977   missing vertebra  . BACK SURGERY     fusions lumbar area  . BREAST BIOPSY  1995   x 2  .  BREAST BIOPSY  07/10/2012   Procedure: BREAST BIOPSY WITH NEEDLE LOCALIZATION;  Surgeon: Rolm Bookbinder, MD;  Location: [facility];  Service: General;  Laterality: Right;  . CARDIOVASCULAR STRESS TEST  2009   Normal, Dr. Radford Pax  . CATARACT EXTRACTION, BILATERAL Bilateral 2005  . Childbirth  [phone]  . CYSTOSCOPY N/A 11/25/2012   Procedure: CYSTOSCOPY;  Surgeon: Reece Packer, MD;  Location: [facility];  Service: Urology;  Laterality: N/A;  . dilatation and currettage  1964  . LEFT HEART CATH AND CORONARY ANGIOGRAPHY N/A 06/15/2017   Procedure: LEFT  HEART CATH AND CORONARY ANGIOGRAPHY;  Surgeon: Troy Sine, MD;  Location: [facility];  Service: Cardiovascular;  Laterality: N/A;  . LEFT HEART CATHETERIZATION WITH CORONARY ANGIOGRAM N/A 09/03/2014   Procedure: LEFT HEART CATHETERIZATION WITH CORONARY ANGIOGRAM;  Surgeon: Lorretta Harp, MD;  Location: [facility] C[facility];  Service: Cardiovascular;  Laterality: N/A;  . LUMBAR LAMINECTOMY/DECOMPRESSION MICRODISCECTOMY N/A 05/01/2013   Procedure: COMPLETE DECOMPRESSIVE LUMBAR LAMINECTOMY L3-L4 MICRODISCECTOMY L3-L4 ON THE LEFT;  Surgeon: Tobi Bastos, MD;  Location: [facility];  Service: Orthopedics;  Laterality: N/A;  . OVARIAN CYST REMOVAL  1964  . PUBOVAGINAL SLING N/A 11/25/2012   Procedure: Gaynelle Arabian;  Surgeon: Reece Packer, MD;  Location: [facility];  Service: Urology;  Laterality: N/A;     OB History   No obstetric history on file.     Family History  Problem Relation Age of Onset  . Arthritis Mother   . Stroke Father   . Hypertension Father   . Heart disease Brother        Died during CABG  . Hypertension Sister   . Dementia Sister   . Aneurysm Sister        Brain  . Parkinsonism Sister   . Kidney disease Maternal Grandmother   . Colon cancer Neg Hx   . Breast cancer Neg Hx     Social History   Tobacco Use  . Smoking status: Never Smoker  . Smokeless tobacco: Never Used  . Tobacco comment: a small amount when 86 yo  Substance Use Topics  . Alcohol use: No    Alcohol/week: 0.0 standard drinks  . Drug use: No    Home Medications Prior to Admission medications   Medication Sig Start Date End Date Taking? Authorizing Provider  Alirocumab (PRALUENT) 75 MG/ML SOAJ Inject 75 mg into the skin every 14 (fourteen) days. 09/01/19   Lorretta Harp, MD  Baclofen 5 MG TABS Take 2.5-5 mg by mouth 2 (two) times daily as needed. 08/08/19   Tonia Ghent, MD  cholecalciferol (VITAMIN D) 1000 UNITS tablet Take 2,000 Units by mouth daily.      [provider]  Coenzyme Q10 (CO Q 10 PO) Take 1 tablet by mouth daily.    [provider]  diclofenac sodium (VOLTAREN) 1 % GEL Apply 2 g topically 4 (four) times daily as needed (for joint aches). 07/23/17   Tonia Ghent, MD  DULoxetine (CYMBALTA) 30 MG capsule Take 90 mg by mouth daily.     [provider]  folic acid (FOLVITE) 827 MCG tablet Take 800 mcg by mouth daily.     [provider]  gabapentin (NEURONTIN) 300 MG capsule Take 1-3 capsules (300-900 mg total) by mouth See admin instructions. 300 mg at lunch and supper and 900 mg in the evening 05/20/19   Tonia Ghent, MD  KRILL OIL PO  Take 100 mg by mouth daily.    [provider]  lamoTRIgine (LAMICTAL) 100 MG tablet Take 1 tablet (100 mg total) by mouth daily after supper. 05/20/19   Tonia Ghent, MD  lisinopril (ZESTRIL) 5 MG tablet Take 1 tablet (5 mg total) by mouth daily. 02/26/19   Lorretta Harp, MD  metoprolol succinate (TOPROL-XL) 25 MG 24 hr tablet TAKE 1 TABLET BY MOUTH EVERY DAY 05/27/19   Lorretta Harp, MD  OLANZapine (ZYPREXA) 2.5 MG tablet Take 1 tablet (2.5 mg total) by mouth at bedtime. 08/05/15   Tonia Ghent, MD  predniSONE (DELTASONE) 10 MG tablet 5 tabs, 4 tabs, 3 tabs, 2 tabs, 1 tab daily taper 08/08/19   Tonia Ghent, MD    Allergies    Digoxin, Nitrofurantoin, Alendronate sodium, Cephalosporins, Chlordiazepoxide hcl, Phenobarbital-belladonna alk, Ritalin [methylphenidate hcl], Statins, Chlordiazepoxide, Clavulanic acid, Omeprazole, Oxcarbazepine, Sulfa antibiotics, Tetracycline, and Zetia [ezetimibe]  Review of Systems   Review of Systems  All other systems reviewed and are negative.   Physical Exam Updated Vital Signs BP (!) 132/99 (BP Location: Left Arm)   Pulse 78   Temp 98.2 F (36.8 C) (Oral)   Resp 16   Ht '5\' 1"'$  (1.549 m)   Wt 77.1 kg   SpO2 93%   BMI 32.12 kg/m   Physical Exam Vitals and nursing note reviewed.   Constitutional:      General: She is not in acute distress.    Appearance: Normal appearance. She is well-developed. She is not ill-appearing, toxic-appearing or diaphoretic.  HENT:     Head: Normocephalic and atraumatic.     Nose: No congestion or rhinorrhea.     Mouth/Throat:     Mouth: Mucous membranes are moist.     Pharynx: No oropharyngeal exudate or posterior oropharyngeal erythema.  Eyes:     Conjunctiva/sclera: Conjunctivae normal.     Pupils: Pupils are equal, round, and reactive to light.  Neck:     Trachea: Phonation normal.  Cardiovascular:     Rate and Rhythm: Normal rate and regular rhythm.  Pulmonary:     Effort: Pulmonary effort is normal. No respiratory distress.     Breath sounds: Normal breath sounds. No stridor.  Chest:     Chest wall: No tenderness.  Abdominal:     General: There is no distension.     Palpations: Abdomen is soft.     Tenderness: There is no abdominal tenderness. There is no guarding.  Musculoskeletal:        General: Normal range of motion.     Cervical back: Normal range of motion and neck supple.  Skin:    General: Skin is warm and dry.  Neurological:     Mental Status: She is alert and oriented to person, place, and time.     Motor: No abnormal muscle tone.     Comments: No dysarthria or aphasia.  No nystagmus.  No pronator drift.  No ataxia.  Psychiatric:        Mood and Affect: Mood normal.        Behavior: Behavior normal.        Thought Content: Thought content normal.        Judgment: Judgment normal.     ED Results / Procedures / Treatments   Labs (all labs ordered are listed, but only abnormal results are displayed) Labs Reviewed  BASIC METABOLIC PANEL - Abnormal; Notable for the following components:      Result Value  GFR calc non Af Amer 57 (*)    All other components within normal limits  CBC - Abnormal; Notable for the following components:   WBC 12.6 (*)    Hemoglobin 15.2 (*)    HCT 47.3 (*)    MCV 100.2  (*)    All other components within normal limits  CBG MONITORING, ED - Abnormal; Notable for the following components:   Glucose-Capillary 124 (*)    All other components within normal limits  URINALYSIS, ROUTINE W REFLEX MICROSCOPIC    EKG EKG Interpretation  Date/Time:  Friday Jan 02 2020 10:04:44 EDT Ventricular Rate:  78 PR Interval:  138 QRS Duration: 76 QT Interval:  364 QTC Calculation: 414 R Axis:   50 Text Interpretation: Sinus rhythm Cannot rule out Anterior infarct , age undetermined Abnormal ECG since last tracing no significant change Confirmed by Daleen Bo [phone]) on 01/02/2020 6:54:29 PM   Radiology CT HEAD WO CONTRAST  Result Date: 01/02/2020 CLINICAL DATA:  Headache, dizziness, weakness EXAM: CT HEAD WITHOUT CONTRAST TECHNIQUE: Contiguous axial images were obtained from the base of the skull through the vertex without intravenous contrast. COMPARISON:  None. FINDINGS: Brain: There is atrophy and chronic small vessel disease changes. No acute intracranial abnormality. Specifically, no hemorrhage, hydrocephalus, mass lesion, acute infarction, or significant intracranial injury. Vascular: No hyperdense vessel or unexpected calcification. Skull: No acute calvarial abnormality. Sinuses/Orbits: Visualized paranasal sinuses and mastoids clear. Orbital soft tissues unremarkable. Other: None IMPRESSION: Atrophy, chronic microvascular disease. No acute intracranial abnormality. Electronically Signed   By: Rolm Baptise M.D.   On: 01/02/2020 13:36    Procedures .Critical Care Performed by: Daleen Bo, MD Authorized by: Daleen Bo, MD   Critical care provider statement:    Critical care time (minutes):  35   Critical care start time:  01/02/2020 6:05 PM   Critical care end time:  01/02/2020 8:25 PM   Critical care time was exclusive of:  Separately billable procedures and treating other patients   Critical care was necessary to treat or prevent imminent or  life-threatening deterioration of the following conditions:  CNS failure or compromise   Critical care was time spent personally by me on the following activities:  Blood draw for specimens, development of treatment plan with patient or surrogate, discussions with consultants, evaluation of patient's response to treatment, examination of patient, obtaining history from patient or surrogate, ordering and performing treatments and interventions, ordering and review of laboratory studies, pulse oximetry, re-evaluation of patient's condition, review of old charts and ordering and review of radiographic studies   (including critical care time)  Medications Ordered in ED Medications - No data to display  ED Course  I have reviewed the triage vital signs and the nursing notes.  Pertinent labs & imaging results that were available during my care of the patient were reviewed by me and considered in my medical decision making (see chart for details).  Clinical Course as of Jan 01 2021  Fri Jan 02, 2020  1811 Elevated  CBG monitoring, ED(!) [EW]  1812 Normal except GFR low  Basic metabolic panel(!) [EW]  6659 Normal except white count high, MCV high  CBC(!) [EW]  1812 Per radiologist, no acute abnormality.  CT HEAD WO CONTRAST [EW]  2016 MR BRAIN WO CONTRAST [EW]  2016 Per radiologist no acute infarct.  Small vessel disease is present.  MR BRAIN WO CONTRAST [EW]    Clinical Course User Index [EW] Daleen Bo, MD   MDM Rules/Calculators/A&P  Patient Vitals for the past 24 hrs:  BP Temp Temp src Pulse Resp SpO2 Height Weight  01/02/20 1528 (!) 132/99 98.2 F (36.8 C) Oral 78 16 93 % -- --  01/02/20 1220 118/73 98.7 F (37.1 C) Oral 71 17 95 % -- --  01/02/20 1013 -- -- -- -- -- -- '5\' 1"'$  (1.549 m) 77.1 kg  01/02/20 0958 131/78 98.4 F (36.9 C) Oral 77 16 93 % -- --    8:22 PM Reevaluation with update and discussion. After initial assessment and treatment, an  updated evaluation reveals no change in clinical status.  Findings discussed with patient and her husband at the bedside, all questions were answered. Daleen Bo   Medical Decision Making:  This patient is presenting for evaluation of generalized weakness, which does require a range of treatment options, and is a complaint that involves a high risk of morbidity and mortality. The differential diagnoses include muscle weakness, spinal disorder, brain disorder, serious infection, metabolic instability. I decided to review old records, and in summary elderly female with chronic medical conditions, that has been stable, with presentation of nonspecific disorder characterized primarily by weakness.  I obtained additional historical information from husband.  Clinical Laboratory Tests Ordered, included CBC, Metabolic panel and Urinalysis. Review indicates Mild elevation white count. Radiologic Tests Ordered, included CT brain.  I independently Visualized: Radiographic images, which show normal brain on CT images  Cardiac Monitor Tracing which shows normal sinus rhythm    Critical Interventions-clinical evaluation, laboratory testing, CT imaging, MRI imaging, observation and reevaluation.  After These Interventions, the Patient was reevaluated and was found screening evaluation for stroke with CT and MRI negative.  No evidence for metabolic instability or pending vascular collapse.  Suspect nonspecific weakness, likely aging related.  She does have chronic microvascular disease of the brain, likely secondary to an ongoing metabolic process.  CRITICAL CARE-yes Performed by: Daleen Bo  Nursing Notes Reviewed/ Care Coordinated Applicable Imaging Reviewed Interpretation of Laboratory Data incorporated into ED treatment  The patient appears reasonably screened and/or stabilized for discharge and I doubt any other medical condition or other [facility] requiring further screening, evaluation, or  treatment in the ED at this time prior to discharge.  Plan: Home Medications-continue usual; Home Treatments-rest, fluids, gradual advance activity; return here if the recommended treatment, does not improve the symptoms; Recommended follow up-PCP follow-up 1 week, consider rehab, and arrange for ongoing management of risk factors for stroke.     Final Clinical Impression(s) / ED Diagnoses Final diagnoses:  Weakness    Rx / DC Orders ED Discharge Orders    None       Daleen Bo, MD 01/02/20 2026

## 2020-01-02 NOTE — ED Triage Notes (Signed)
Patient sent by PCP for stroke work up. Reports weakness, headache and light headedness since last week starting Wednesday night. No neuro deficits noted in triage.

## 2020-01-02 NOTE — Telephone Encounter (Signed)
Per chart review tab pt is at . 

## 2020-01-02 NOTE — Telephone Encounter (Signed)
Pt and pts husband on the phone; last week pt experienced H/A,brain felt scrambled, pt knew where she was but just could not slow her brain down and pt was lightheaded. Pt had chills and all her joints ached at the episode last wk.pt had generalized weakness all over. Pt still has the generalized weakness that has worsened somewhat. Today BP 149/72 P 84. Pt has hx of stroke. Pt went to ED last night but wait was too long and pt went home. Pt has been trying to drink more liquids.Pt's husband said pt will go to [facility] ED for eval and testing. FYI to Dr Para March.

## 2020-01-02 NOTE — ED Notes (Signed)
Pt unable to void pt is aware of needed urine

## 2020-01-02 NOTE — Discharge Instructions (Addendum)
The testing today did not show any serious problems to explain your weakness.  There was no evidence of acute stroke.  Brain MRI did show chronic small vessel ischemic disease, but this does not mean you have had a stroke.  The best treatment for this is to control your risk factors, to help prevent stroke in the future.  Your primary care doctor can help you with that.

## 2020-01-02 NOTE — Telephone Encounter (Signed)
[name] Night - Client Nonclinical Telephone Record AccessNurse Client [name] Night - Client Client Site  [facility] [place] - Night Physician Raechel Ache - MD Contact Type Call Who Is Calling Patient / Member / Family / Caregiver Caller Name [name] Caller Phone Number [phone] Patient Name Robin Juarez Patient DOB 1931-01-28 Call Type Message Only Information Provided Reason for Call Request to Schedule Office Appointment Initial Comment Caller's wife has been on prednisone for a week from her back MD. Previous issues with her head and back. She has been more weak every day. They spoke with an afterhours RN and went to the ER last night and there was a really long wait so they came home last night (she was never seen.) The weakness started even before she began the prednisone. Former stroke pt. Additional Comment Office hours provided. Please call asap to schedule her today if possible. Weakness is getting worse and callers were advised to see Dr. Para March, not specialists. Disp. Time Disposition Final User 01/02/2020 8:10:32 AM General Information Provided Yes Gokounous, Erin Call Closed By: Tomie China Transaction Date/Time: 01/02/2020 8:03:25 AM (ET)

## 2020-01-02 NOTE — ED Notes (Signed)
Pt went to MRI.

## 2020-01-08 ENCOUNTER — Other Ambulatory Visit: Payer: Self-pay | Admitting: Cardiovascular Disease

## 2020-01-13 ENCOUNTER — Encounter: Payer: Self-pay | Admitting: Family Medicine

## 2020-01-13 ENCOUNTER — Ambulatory Visit: Payer: Medicare Other | Admitting: Family Medicine

## 2020-01-13 ENCOUNTER — Other Ambulatory Visit: Payer: Self-pay

## 2020-01-13 DIAGNOSIS — R531 Weakness: Secondary | ICD-10-CM

## 2020-01-13 NOTE — Progress Notes (Signed)
This visit occurred during the SARS-CoV-2 public health emergency.  Safety protocols were in place, including screening questions prior to the visit, additional usage of staff PPE, and extensive cleaning of exam room while observing appropriate contact time as indicated for disinfecting solutions.  The day prior to sx onset, "I didn't feel quite right."  Then went to bed and had diffuse aches and joint pain.  Then she had a "funny feeling in my head likely my brain was scrambled but I could still think".  She couldn't rest and sleep well.  That went on all night.  She had some chills prior to that.  The next AM she was supposed to start on prednisone- she started the med and felt a little better prior to prednisone.  Over the next few days she felt progressively weak while on prednisone.  Then she went to ER.  She was on prednisone for a few days prior to ER eval.    She clearly feels better, gradually returned to normal in the meantime. No FCNAVD.  No rash, no weakness.    Meds, vitals, and allergies reviewed.   ROS: Per HPI unless specifically indicated in ROS section   GEN: nad, alert and oriented HEENT: ncat NECK: supple w/o LA CV: rrr.  PULM: ctab, no inc wob ABD: soft, +bs EXT: no edema SKIN: no acute rash

## 2020-01-13 NOTE — Patient Instructions (Signed)
It is unclear to me why this happened but it doesn't appear that you need to do anything else at this point. If you have more troubles then let me know.  Take care.  Glad to see you.

## 2020-01-14 NOTE — Assessment & Plan Note (Signed)
Generalized weakness without focal weakness.  She did not have a focal neurologic deficit.  She had chills along with joint aches.  She clearly feels better in the meantime.  I do not know if some of her symptoms were related to or exacerbated by prednisone use.  She does have symptoms that clearly started before she was on prednisone.  I do not know she could have had a relatively self-limited and benign viral process that caused some of her symptoms but resolved in the meantime.  At this point still okay for outpatient follow-up.  We talked about a broad differential diagnosis but since she is feeling better it does not appear she needs everything else done at this point.  She agreed.  She will update me as needed.

## 2020-02-02 ENCOUNTER — Telehealth: Payer: Self-pay

## 2020-02-02 NOTE — Telephone Encounter (Signed)
Pt called in stating that they are having severe pain in their abdomen and would like to speak to a pharmd regarding the possibilty of the praluent 75mg  causing it even after being on it over a year. She stated that pain occurs once a year and usually puts her having to go to the hospital. She also stated other doctors seem to think its related to her spine. I will route to the pharmd pool to further address.

## 2020-02-02 NOTE — Telephone Encounter (Signed)
Severe abdominal pain should not be coming from her Praluent, especially since she has been taking it for over a year.  Called pt who states once a year she gets admitted to the hospital for severe abdominal pain in her sides and the MDs there tell her that it's from back pain/arthritis. She has gone 3x to the hospital or ED, most recent that I can see is from 08/05/19. She has taken either Praluent or Repatha since 2018 and reports no issues over the past few years. Advised her that patients who have pain from Praluent typically have joint or muscle pain in their arms, legs or back, but that it occurs the whole time they take the medicine. Since she is having episodes about once a year, this should not be from her injections. Encouraged her to follow up with PCP if needed for better pain control as it does seem to be arthritis related. She verbalized understanding and had no further questions.

## 2020-02-07 ENCOUNTER — Other Ambulatory Visit: Payer: Self-pay | Admitting: Cardiovascular Disease

## 2020-03-16 ENCOUNTER — Telehealth: Payer: Self-pay

## 2020-03-16 NOTE — Telephone Encounter (Signed)
Pt said for couple of months upon exertion or resting on and off randomly pt cannot catch her breath. Pt said it is not like a SOB and does not last long. No CP,H/A,dizziness. Pt has no covid symptoms, no travel and no known exposure to + covid. Pt had last Moderna vaccine on 11/24/19. Pt has had some pain all over on and off and generalized weakness once this wk. Offered appt with another provider today but pt only wants to see Dr Para March and has dental appt today so could not come in today. Pt scheduled in office appt with Dr Para March on 03/18/20 at 11:30 . UC & ED precautions given and pt voiced understanding.

## 2020-03-16 NOTE — Telephone Encounter (Signed)
[facility] Night - Client Nonclinical Telephone Record AccessNurse Client [facility] Night - Client Client Site [place] [facility] [place] - Night Physician Raechel Ache - MD Contact Type Call Who Is Calling Patient / Member / Family / Caregiver Caller Name Jaylei Fuerte Caller Phone Number [phone] Patient Name Robin Juarez Patient DOB [birth year] Call Type Message Only Information Provided Reason for Call Request to Schedule Office Appointment Initial Comment caller wanting to make an appt as she's having to catch her breath for about a month or longer and she's not feeling good and wants an exam -- She refused Triage Disp. Time Disposition Final User 03/15/2020 5:05:18 PM General Information Provided Yes Tiburcio Pea, Lanette Call Closed By: Evette Doffing Transaction Date/Time: 03/15/2020 5:01:41 PM (ET)

## 2020-03-16 NOTE — Telephone Encounter (Signed)
Noted. Thanks.

## 2020-03-18 ENCOUNTER — Other Ambulatory Visit: Payer: Self-pay

## 2020-03-18 ENCOUNTER — Encounter: Payer: Self-pay | Admitting: Family Medicine

## 2020-03-18 ENCOUNTER — Ambulatory Visit: Payer: Medicare Other | Admitting: Family Medicine

## 2020-03-18 ENCOUNTER — Ambulatory Visit (INDEPENDENT_AMBULATORY_CARE_PROVIDER_SITE_OTHER)
Admission: RE | Admit: 2020-03-18 | Discharge: 2020-03-18 | Disposition: A | Discharge status: Home / Self Care | Payer: Medicare Other | Source: Ambulatory Visit | Attending: Family Medicine | Admitting: Family Medicine

## 2020-03-18 VITALS — BP 122/80 | HR 77 | Temp 96.7°F | Wt 176.2 lb

## 2020-03-18 DIAGNOSIS — R0602 Shortness of breath: Secondary | ICD-10-CM | POA: Diagnosis not present

## 2020-03-18 LAB — BASIC METABOLIC PANEL
BUN: 11 mg/dL (ref 6–23)
CO2: 31 mEq/L (ref 19–32)
Calcium: 9.5 mg/dL (ref 8.4–10.5)
Chloride: 104 mEq/L (ref 96–112)
Creatinine, Ser: 0.78 mg/dL (ref 0.40–1.20)
GFR: 69.9 mL/min (ref >60.00)
Glucose, Bld: 94 mg/dL (ref 70–99)
Potassium: 4 mEq/L (ref 3.5–5.1)
Sodium: 139 mEq/L (ref 135–145)

## 2020-03-18 LAB — BRAIN NATRIURETIC PEPTIDE: Pro B Natriuretic peptide (BNP): 63 pg/mL (ref 0.0–100.0)

## 2020-03-18 NOTE — Patient Instructions (Signed)
Go to the lab on the way out.   If you have mychart we'll likely use that to update you.    Don't change your meds for now.  Take care.  Glad to see you. 

## 2020-03-18 NOTE — Progress Notes (Signed)
This visit occurred during the SARS-CoV-2 public health emergency.  Safety protocols were in place, including screening questions prior to the visit, additional usage of staff PPE, and extensive cleaning of exam room while observing appropriate contact time as indicated for disinfecting solutions.  She feels like she needs to take a deep breath, needing to catch her breath.  She wanted this checked out.  No CP.  Going on for about 1 month episodically.  Can happen at rest, laying down, walking.  Recurrent, intermittent, random onset.    No new meds.  No wheeze.  No FCNAVD.  She doesn't feel unwell or lightheaded.  He ability to walk is at baseline.  She has some R hip pain but her exertional capacity isn't dramatically lower.  No BLE edema.  She isn't waking gasping for air.  No heartburn.  She is a little fatigued at baseline, that isn't new.  No throat sx.  No voice changes.  She had occ pill but not food dysphagia. No syncope.    Meds, vitals, and allergies reviewed.   ROS: Per HPI unless specifically indicated in ROS section   GEN: nad, alert and oriented HEENT: ncat NECK: supple w/o LA CV: rrr PULM: ctab, no inc wob ABD: soft, +bs EXT: no edema SKIN: no acute rash

## 2020-03-21 DIAGNOSIS — R0602 Shortness of breath: Secondary | ICD-10-CM | POA: Insufficient documentation

## 2020-03-21 MED ORDER — FAMOTIDINE 20 MG PO TABS: 20.0000 mg | ORAL_TABLET | Freq: Every day | ORAL | Status: DC

## 2020-03-21 NOTE — Assessment & Plan Note (Signed)
Episodically noted, no clear trigger.  It can happen at rest or laying down or walking.  She has recurrent, intermittent symptoms with seemingly random onset.  Still okay for outpatient follow-up.  See orders and results.  Discussed with patient.  She agrees with plan.

## 2020-03-31 ENCOUNTER — Other Ambulatory Visit: Payer: Self-pay | Admitting: Family Medicine

## 2020-03-31 ENCOUNTER — Other Ambulatory Visit: Payer: Self-pay

## 2020-03-31 ENCOUNTER — Other Ambulatory Visit: Payer: Self-pay | Admitting: Cardiovascular Disease

## 2020-03-31 MED ORDER — METOPROLOL SUCCINATE ER 25 MG PO TB24: 25.0000 mg | ORAL_TABLET | Freq: Every day | ORAL | 0 refills | Status: DC

## 2020-04-01 NOTE — Telephone Encounter (Signed)
Electronic refill request. Baclofen Last office visit:   03/18/2020 Last Filled:   08/08/2019 Please advise.

## 2020-04-02 NOTE — Telephone Encounter (Signed)
Sent. Thanks.   

## 2020-05-03 ENCOUNTER — Other Ambulatory Visit: Payer: Self-pay | Admitting: Cardiovascular Disease

## 2020-05-07 ENCOUNTER — Telehealth: Payer: Self-pay | Admitting: Pharmacist

## 2020-05-07 ENCOUNTER — Telehealth: Payer: Self-pay | Admitting: *Deleted

## 2020-05-07 NOTE — Telephone Encounter (Signed)
Please see what details you can get and let me know re: this RX. Thanks.

## 2020-05-07 NOTE — Telephone Encounter (Signed)
Patient talked to her neighbor "that is very book smart" and was told her cholesterol is too low.  She is calling to clarify if she needs any therapy adjustment.  Most recent lipid panel  CHO 153 TG 117 HDL 59 LDL 70  *I reassure her most recent lipid panel is within normal limits and we will re-assess any time her LDL drops under 25. At this timet we have no concerns, and will she should repeat lipid panel with her wellness visit.

## 2020-05-07 NOTE — Telephone Encounter (Signed)
Patient states [facility] is taking over their medications now but she did not submit a refill request for Prednisone.  Request denial faxed.

## 2020-05-07 NOTE — Telephone Encounter (Signed)
Requesting Prednisone Tab to [facility]. Last office visit:   03/18/2020 Last Filled:   08/08/2019   15 tabs

## 2020-05-11 ENCOUNTER — Telehealth: Payer: Self-pay | Admitting: Family Medicine

## 2020-05-11 ENCOUNTER — Other Ambulatory Visit: Payer: Self-pay | Admitting: *Deleted

## 2020-05-11 ENCOUNTER — Telehealth: Payer: Self-pay | Admitting: Cardiovascular Disease

## 2020-05-11 MED ORDER — VITAMIN D3 25 MCG (1000 UNIT) PO TABS: 2000.0000 [IU] | ORAL_TABLET | Freq: Every day | ORAL | 3 refills | Status: DC

## 2020-05-11 MED ORDER — METOPROLOL SUCCINATE ER 25 MG PO TB24: 25.0000 mg | ORAL_TABLET | Freq: Every day | ORAL | 1 refills | Status: DC

## 2020-05-11 NOTE — Telephone Encounter (Signed)
Called [facility].  They are requesting Rx's for Baclofen and Vitamin D.  Requested refill on Baclofen sent to Dr. Para March.  Vitamin D sent electronically.

## 2020-05-11 NOTE — Telephone Encounter (Signed)
Needs refill sent to [facility] for metoprolol succ.    Done

## 2020-05-11 NOTE — Telephone Encounter (Signed)
Electronic refill request. Baclofen Last office visit:   03/18/2020 Last Filled:     90 tablet 1 04/02/2020  Patient has switched pharmacies to [facility] and the pharmacy called for a new Rx of Baclofen.

## 2020-05-11 NOTE — Telephone Encounter (Signed)
Baxter Hire, can you please address this issue thank you

## 2020-05-11 NOTE — Telephone Encounter (Signed)
    Pt c/o medication issue:  1. Name of Medication: all heart meds  2. How are you currently taking this medication (dosage and times per day)?   3. Are you having a reaction (difficulty breathing--STAT)?   4. What is your medication issue? Pt said if Dr. Allyson Sabal can call the [facility] line to discuss all her meds Dr. Allyson Sabal prescribed she gave [facility] MD line [phone]

## 2020-05-11 NOTE — Telephone Encounter (Signed)
Pt called stating she is changed pharmacy to [facility] order.    She stated [facility] wanted medical assistant to call the MD line [phone] They have questions regarding pt meds

## 2020-05-12 MED ORDER — BACLOFEN 5 MG PO TABS: ORAL_TABLET | ORAL | 1 refills | Status: DC

## 2020-05-12 NOTE — Telephone Encounter (Signed)
Sent. Thanks.   

## 2020-05-13 ENCOUNTER — Other Ambulatory Visit: Payer: Self-pay | Admitting: *Deleted

## 2020-05-13 MED ORDER — VITAMIN D3 25 MCG (1000 UNIT) PO TABS: 2000.0000 [IU] | ORAL_TABLET | Freq: Every day | ORAL | 3 refills | Status: DC

## 2020-05-16 ENCOUNTER — Other Ambulatory Visit: Payer: Self-pay | Admitting: Family Medicine

## 2020-05-16 DIAGNOSIS — E559 Vitamin D deficiency, unspecified: Secondary | ICD-10-CM

## 2020-05-16 DIAGNOSIS — M810 Age-related osteoporosis without current pathological fracture: Secondary | ICD-10-CM

## 2020-05-16 DIAGNOSIS — E782 Mixed hyperlipidemia: Secondary | ICD-10-CM

## 2020-05-17 ENCOUNTER — Ambulatory Visit: Payer: Medicare Other

## 2020-05-17 ENCOUNTER — Other Ambulatory Visit: Payer: Medicare Other

## 2020-05-18 ENCOUNTER — Other Ambulatory Visit: Payer: Medicare Other

## 2020-05-18 ENCOUNTER — Ambulatory Visit (INDEPENDENT_AMBULATORY_CARE_PROVIDER_SITE_OTHER): Payer: Medicare Other

## 2020-05-18 ENCOUNTER — Other Ambulatory Visit: Payer: Self-pay

## 2020-05-18 VITALS — Wt 173.0 lb

## 2020-05-18 DIAGNOSIS — Z0000 Encounter for general adult medical examination without abnormal findings: Secondary | ICD-10-CM

## 2020-05-18 NOTE — Progress Notes (Signed)
PCP notes:  Health Maintenance: Flu- due   Abnormal Screenings: none   Patient concerns: none   Nurse concerns: none   Next PCP appt.: 05/31/2020 @ 10 am

## 2020-05-18 NOTE — Patient Instructions (Signed)
Robin Juarez , Thank you for taking time to come for your Medicare Wellness Visit. I appreciate your ongoing commitment to your health goals. Please review the following plan we discussed and let me know if I can assist you in the future.   Screening recommendations/referrals: Colonoscopy: no longer required Mammogram: no longer required Bone Density: declined  Recommended yearly ophthalmology/optometry visit for glaucoma screening and checkup Recommended yearly dental visit for hygiene and checkup  Vaccinations: Influenza vaccine: due, will get at office visit  Pneumococcal vaccine: Completed series Tdap vaccine: Up to date, completed 04/21/2014, due 04/2024 Shingles vaccine: Completed series   Covid-19:Completed series  Advanced directives: Please bring a copy of your POA (Power of Attorney) and/or Living Will to your next appointment.  Conditions/risks identified: hypertension  Next appointment: Follow up in one year for your annual wellness visit    Preventive Care 65 Years and Older, Female Preventive care refers to lifestyle choices and visits with your health care provider that can promote health and wellness. What does preventive care include?  A yearly physical exam. This is also called an annual well check.  Dental exams once or twice a year.  Routine eye exams. Ask your health care provider how often you should have your eyes checked.  Personal lifestyle choices, including:  Daily care of your teeth and gums.  Regular physical activity.  Eating a healthy diet.  Avoiding tobacco and drug use.  Limiting alcohol use.  Practicing safe sex.  Taking low-dose aspirin every day.  Taking vitamin and mineral supplements as recommended by your health care provider. What happens during an annual well check? The services and screenings done by your health care provider during your annual well check will depend on your age, overall health, lifestyle risk factors, and  family history of disease. Counseling  Your health care provider may ask you questions about your:  Alcohol use.  Tobacco use.  Drug use.  Emotional well-being.  Home and relationship well-being.  Sexual activity.  Eating habits.  History of falls.  Memory and ability to understand (cognition).  Work and work Astronomer.  Reproductive health. Screening  You may have the following tests or measurements:  Height, weight, and BMI.  Blood pressure.  Lipid and cholesterol levels. These may be checked every 5 years, or more frequently if you are over 84 years old.  Skin check.  Lung cancer screening. You may have this screening every year starting at age 84 if you have a 30-pack-year history of smoking and currently smoke or have quit within the past 15 years.  Fecal occult blood test (FOBT) of the stool. You may have this test every year starting at age 84.  Flexible sigmoidoscopy or colonoscopy. You may have a sigmoidoscopy every 5 years or a colonoscopy every 10 years starting at age 84.  Hepatitis C blood test.  Hepatitis B blood test.  Sexually transmitted disease (STD) testing.  Diabetes screening. This is done by checking your blood sugar (glucose) after you have not eaten for a while (fasting). You may have this done every 1-3 years.  Bone density scan. This is done to screen for osteoporosis. You may have this done starting at age 84.  Mammogram. This may be done every 1-2 years. Talk to your health care provider about how often you should have regular mammograms. Talk with your health care provider about your test results, treatment options, and if necessary, the need for more tests. Vaccines  Your health care provider may recommend  certain vaccines, such as:  Influenza vaccine. This is recommended every year.  Tetanus, diphtheria, and acellular pertussis (Tdap, Td) vaccine. You may need a Td booster every 10 years.  Zoster vaccine. You may need this  after age 84.  Pneumococcal 13-valent conjugate (PCV13) vaccine. One dose is recommended after age 84.  Pneumococcal polysaccharide (PPSV23) vaccine. One dose is recommended after age 84. Talk to your health care provider about which screenings and vaccines you need and how often you need them. This information is not intended to replace advice given to you by your health care provider. Make sure you discuss any questions you have with your health care provider. Document Released: 09/03/2015 Document Revised: 04/26/2016 Document Reviewed: 06/08/2015 Elsevier Interactive Patient Education  2017 [place] Prevention in the Home Falls can cause injuries. They can happen to people of all ages. There are many things you can do to make your home safe and to help prevent falls. What can I do on the outside of my home?  Regularly fix the edges of walkways and driveways and fix any cracks.  Remove anything that might make you trip as you walk through a door, such as a raised step or threshold.  Trim any bushes or trees on the path to your home.  Use bright outdoor lighting.  Clear any walking paths of anything that might make someone trip, such as rocks or tools.  Regularly check to see if handrails are loose or broken. Make sure that both sides of any steps have handrails.  Any raised decks and porches should have guardrails on the edges.  Have any leaves, snow, or ice cleared regularly.  Use sand or salt on walking paths during winter.  Clean up any spills in your garage right away. This includes oil or grease spills. What can I do in the bathroom?  Use night lights.  Install grab bars by the toilet and in the tub and shower. Do not use towel bars as grab bars.  Use non-skid mats or decals in the tub or shower.  If you need to sit down in the shower, use a plastic, non-slip stool.  Keep the floor dry. Clean up any water that spills on the floor as soon as it  happens.  Remove soap buildup in the tub or shower regularly.  Attach bath mats securely with double-sided non-slip rug tape.  Do not have throw rugs and other things on the floor that can make you trip. What can I do in the bedroom?  Use night lights.  Make sure that you have a light by your bed that is easy to reach.  Do not use any sheets or blankets that are too big for your bed. They should not hang down onto the floor.  Have a firm chair that has side arms. You can use this for support while you get dressed.  Do not have throw rugs and other things on the floor that can make you trip. What can I do in the kitchen?  Clean up any spills right away.  Avoid walking on wet floors.  Keep items that you use a lot in easy-to-reach places.  If you need to reach something above you, use a strong step stool that has a grab bar.  Keep electrical cords out of the way.  Do not use floor polish or wax that makes floors slippery. If you must use wax, use non-skid floor wax.  Do not have throw rugs and other  things on the floor that can make you trip. What can I do with my stairs?  Do not leave any items on the stairs.  Make sure that there are handrails on both sides of the stairs and use them. Fix handrails that are broken or loose. Make sure that handrails are as long as the stairways.  Check any carpeting to make sure that it is firmly attached to the stairs. Fix any carpet that is loose or worn.  Avoid having throw rugs at the top or bottom of the stairs. If you do have throw rugs, attach them to the floor with carpet tape.  Make sure that you have a light switch at the top of the stairs and the bottom of the stairs. If you do not have them, ask someone to add them for you. What else can I do to help prevent falls?  Wear shoes that:  Do not have high heels.  Have rubber bottoms.  Are comfortable and fit you well.  Are closed at the toe. Do not wear sandals.  If you  use a stepladder:  Make sure that it is fully opened. Do not climb a closed stepladder.  Make sure that both sides of the stepladder are locked into place.  Ask someone to hold it for you, if possible.  Clearly mark and make sure that you can see:  Any grab bars or handrails.  First and last steps.  Where the edge of each step is.  Use tools that help you move around (mobility aids) if they are needed. These include:  Canes.  Walkers.  Scooters.  Crutches.  Turn on the lights when you go into a dark area. Replace any light bulbs as soon as they burn out.  Set up your furniture so you have a clear path. Avoid moving your furniture around.  If any of your floors are uneven, fix them.  If there are any pets around you, be aware of where they are.  Review your medicines with your doctor. Some medicines can make you feel dizzy. This can increase your chance of falling. Ask your doctor what other things that you can do to help prevent falls. This information is not intended to replace advice given to you by your health care provider. Make sure you discuss any questions you have with your health care provider. Document Released: 06/03/2009 Document Revised: 01/13/2016 Document Reviewed: 09/11/2014 Elsevier Interactive Patient Education  2017 [employer].

## 2020-05-18 NOTE — Progress Notes (Signed)
Subjective:   Robin Juarez is a 86 y.o. female who presents for Medicare Annual (Subsequent) preventive examination.  Review of Systems: N/A     I connected with the patient today by telephone and verified that I am speaking with the correct person using two identifiers. Location patient: home Location nurse: work Persons participating in the telephone visit: patient, nurse.   I discussed the limitations, risks, security and privacy concerns of performing an evaluation and management service by telephone and the availability of in person appointments. I also discussed with the patient that there may be a patient responsible charge related to this service. The patient expressed understanding and verbally consented to this telephonic visit.        Cardiac Risk Factors include: advanced age (>68mn, >>34women);hypertension     Objective:    Today's Vitals   05/18/20 1524  Weight: 173 lb (78.5 kg)   Body mass index is 33.79 kg/m.  Advanced Directives 05/18/2020 08/05/2019 09/04/2018 09/03/2018 04/29/2018 09/10/2017 06/14/2017  Does Patient Have a Medical Advance Directive? Yes No Yes Yes No Yes Yes  Type of AParamedicof ARaven[place]Living will - Living will Living will - [place] will [place]aton EstatesLiving will  Does patient want to make changes to medical advance directive? - - No - Patient declined - - - No - Patient declined  Copy of [place] Chart? No - copy requested - - - - No - copy requested No - copy requested  Would patient like information on creating a medical advance directive? - No - Patient declined - - - - -  Pre-existing out of facility DNR order (yellow form or pink MOST form) - - - - - - -    Current Medications (verified) Outpatient Encounter Medications as of 05/18/2020  Medication Sig  . Alirocumab (PRALUENT) 75 MG/ML SOAJ Inject 75 mg into the skin every 14 (fourteen)  days.  . Baclofen 5 MG TABS TAKE 1 TABLET (5 MG) BY MOUTH DAILY AS NEEDED (FOR MUSCLE SPASMS).  . busPIRone (BUSPAR) 10 MG tablet as needed.  . cholecalciferol (VITAMIN D) 25 MCG (1000 UNIT) tablet Take 2 tablets (2,000 Units total) by mouth daily.  . Coenzyme Q10 (CO Q 10 PO) Take 1 tablet by mouth daily.  . diclofenac sodium (VOLTAREN) 1 % GEL Apply 2 g topically 4 (four) times daily as needed (for joint aches).  . DULoxetine (CYMBALTA) 30 MG capsule Take 90 mg by mouth daily.   . famotidine (PEPCID) 20 MG tablet Take 1 tablet (20 mg total) by mouth daily.  . folic acid (FOLVITE) 8034MCG tablet Take 800 mcg by mouth daily.   .Marland Kitchengabapentin (NEURONTIN) 300 MG capsule Take 1-3 capsules (300-900 mg total) by mouth See admin instructions. 300 mg at lunch and supper and 900 mg in the evening  . KRILL OIL PO Take 100 mg by mouth daily.  .Marland KitchenlamoTRIgine (LAMICTAL) 100 MG tablet Take 1 tablet (100 mg total) by mouth daily after supper.  .Marland Kitchenlisinopril (ZESTRIL) 5 MG tablet TAKE 1 TABLET (5 MG TOTAL) BY MOUTH DAILY. NEED OV.  . metoprolol succinate (TOPROL-XL) 25 MG 24 hr tablet Take 1 tablet (25 mg total) by mouth daily.  .Marland KitchenOLANZapine (ZYPREXA) 2.5 MG tablet Take 1 tablet (2.5 mg total) by mouth at bedtime.   No facility-administered encounter medications on file as of 05/18/2020.    Allergies (verified) Digoxin, Nitrofurantoin, Alendronate sodium, Cephalosporins, Chlordiazepoxide  hcl, Phenobarbital-belladonna alk, Ritalin [methylphenidate hcl], Statins, Chlordiazepoxide, Clavulanic acid, Omeprazole, Oxcarbazepine, Sulfa antibiotics, Tetracycline, and Zetia [ezetimibe]   History: Past Medical History:  Diagnosis Date  . Arthritis    knee and hip OA- prev injection by Dr. Nelva Bush  . Blood transfusion without reported diagnosis    37 yrs ago -s/p back surgery  . Chest pain   . Coronary artery disease    50% mid LAD by cath January 2016  . Depression 1993, 2000  . Fibromyalgia   . GERD  (gastroesophageal reflux disease)   . H/O hiatal hernia   . Hyperlipidemia   . Hypertension   . OSA (obstructive sleep apnea)    off tx as of 2020, no sx as of 2020  . Osteoporosis   . Positive cardiac stress test   . Stroke ([place])   . Urinary incontinence   . UTI (lower urinary tract infection)    Past Surgical History:  Procedure Laterality Date  . ABDOMINAL HYSTERECTOMY  1976  . APPENDECTOMY  1964  . Back fusions  1977   missing vertebra  . BACK SURGERY     fusions lumbar area  . BREAST BIOPSY  1995   x 2  . BREAST BIOPSY  07/10/2012   Procedure: BREAST BIOPSY WITH NEEDLE LOCALIZATION;  Surgeon: Rolm Bookbinder, MD;  Location: [facility];  Service: General;  Laterality: Right;  . CARDIOVASCULAR STRESS TEST  2009   Normal, Dr. Radford Pax  . CATARACT EXTRACTION, BILATERAL Bilateral 2005  . Childbirth  [phone]  . CYSTOSCOPY N/A 11/25/2012   Procedure: CYSTOSCOPY;  Surgeon: Reece Packer, MD;  Location: [facility];  Service: Urology;  Laterality: N/A;  . dilatation and currettage  1964  . LEFT HEART CATH AND CORONARY ANGIOGRAPHY N/A 06/15/2017   Procedure: LEFT HEART CATH AND CORONARY ANGIOGRAPHY;  Surgeon: Troy Sine, MD;  Location: [facility];  Service: Cardiovascular;  Laterality: N/A;  . LEFT HEART CATHETERIZATION WITH CORONARY ANGIOGRAM N/A 09/03/2014   Procedure: LEFT HEART CATHETERIZATION WITH CORONARY ANGIOGRAM;  Surgeon: Lorretta Harp, MD;  Location: [facility];  Service: Cardiovascular;  Laterality: N/A;  . LUMBAR LAMINECTOMY/DECOMPRESSION MICRODISCECTOMY N/A 05/01/2013   Procedure: COMPLETE DECOMPRESSIVE LUMBAR LAMINECTOMY L3-L4 MICRODISCECTOMY L3-L4 ON THE LEFT;  Surgeon: Tobi Bastos, MD;  Location: [facility];  Service: Orthopedics;  Laterality: N/A;  . OVARIAN CYST REMOVAL  1964  . PUBOVAGINAL SLING N/A 11/25/2012   Procedure: Gaynelle Arabian;  Surgeon: Reece Packer, MD;  Location: [facility];  Service: Urology;  Laterality: N/A;   Family History  Problem Relation Age of Onset  . Arthritis Mother   . Stroke Father   . Hypertension Father   . Heart disease Brother        Died during CABG  . Hypertension Sister   . Dementia Sister   . Aneurysm Sister        Brain  . Parkinsonism Sister   . Kidney disease Maternal Grandmother   . Colon cancer Neg Hx   . Breast cancer Neg Hx    Social History   Socioeconomic History  . Marital status: Married    Spouse name: Not on file  . Number of children: 4  . Years of education: Not on file  . Highest education level: Not on file  Occupational History  . Occupation: RETIRED    Employer: RETIRED  Tobacco Use  . Smoking status: Never Smoker  . Smokeless tobacco: Never  Used  . Tobacco comment: a small amount when 86 yo  Vaping Use  . Vaping Use: Never used  Substance and Sexual Activity  . Alcohol use: No    Alcohol/week: 0.0 standard drinks  . Drug use: No  . Sexual activity: Not Currently  Other Topics Concern  . Not on file  Social History Narrative   Married 1953, husband is well.   5 pregnancies, 4 live births, all well (5th was unexpect pregnancy and patient needed to terminate due to concurrent medical problems at the time)   11 grandchildren.  9 great grandchildren   Regular exercise:  No   Social Determinants of Health   Financial Resource Strain: Low Risk   . Difficulty of Paying Living Expenses: Not hard at all  Food Insecurity: No Food Insecurity  . Worried About Charity fundraiser in the Last Year: Never true  . Ran Out of Food in the Last Year: Never true  Transportation Needs: No Transportation Needs  . Lack of Transportation (Medical): No  . Lack of Transportation (Non-Medical): No  Physical Activity: Inactive  . Days of Exercise per Week: 0 days  . Minutes of Exercise per Session: 0 min  Stress: No Stress Concern Present  . Feeling of Stress : Not at all  Social Connections:    . Frequency of Communication with Friends and Family: Not on file  . Frequency of Social Gatherings with Friends and Family: Not on file  . Attends Religious Services: Not on file  . Active Member of Clubs or Organizations: Not on file  . Attends Archivist Meetings: Not on file  . Marital Status: Not on file    Tobacco Counseling Counseling given: Not Answered Comment: a small amount when 86 yo   Clinical Intake:  Pre-visit preparation completed: Yes  Pain : No/denies pain     Nutritional Risks: None Diabetes: No  How often do you need to have someone help you when you read instructions, pamphlets, or other written materials from your doctor or pharmacy?: 1 - Never What is the last grade level you completed in school?: 12th  Diabetic: No Nutrition Risk Assessment:  Has the patient had any N/V/D within the last 2 months?  No  Does the patient have any non-healing wounds?  No  Has the patient had any unintentional weight loss or weight gain?  No   Diabetes:  Is the patient diabetic?  No  If diabetic, was a CBG obtained today?  N/A Did the patient bring in their glucometer from home?  N/A How often do you monitor your CBG's? N/A.   Financial Strains and Diabetes Management:  Are you having any financial strains with the device, your supplies or your medication? N/A.  Does the patient want to be seen by Chronic Care Management for management of their diabetes?  N/A Would the patient like to be referred to a Nutritionist or for Diabetic Management?  N/A    Interpreter Needed?: No  Information entered by :: CJohnson, LPN   Activities of Daily Living In your present state of health, do you have any difficulty performing the following activities: 05/18/2020  Hearing? Y  Comment does not want hearing aids right now  Vision? N  Difficulty concentrating or making decisions? N  Walking or climbing stairs? N  Dressing or bathing? N  Doing errands,  shopping? N  Preparing Food and eating ? N  Using the Toilet? N  In the past six months, have  you accidently leaked urine? Y  Comment wears pads and depends  Do you have problems with loss of bowel control? Y  Comment wears pads and depends  Managing your Medications? N  Managing your Finances? N  Housekeeping or managing your Housekeeping? N  Some recent data might be hidden    Patient Care Team: Tonia Ghent, MD as PCP - General Brayton Caves, MD as Consulting Physician (Psychiatry) Lorretta Harp, MD as Consulting Physician (Cardiology) Evaristo Bury, MD as Consulting Physician (Obstetrics and Gynecology) Agapito Games as Referring Physician (Optometry) Altha Harm, Crowder as Referring Physician (Dentistry)  Indicate any recent Medical Services you may have received from other than [facility] providers in the past year (date may be approximate).     Assessment:   This is a routine wellness examination for Amali.  Hearing/Vision screen  Hearing Screening   _0  _1  _2  _3  _4  _5  _6  _7  _8   Right ear:           Left ear:           Vision Screening Comments: Patient gets annual eye exams   Dietary issues and exercise activities discussed: Current Exercise Habits: The patient does not participate in regular exercise at present, Exercise limited by: None identified  Goals    . LIFESTYLE - EAT BREAKFAST     Starting 09/10/2017, I will continue to drink a healthy smoothie for breakfast.     . Patient Stated     05/18/2020, I will maintain and continue medications as prescribed.       Depression Screen PHQ 2/9 Scores 05/18/2020 09/10/2017 08/05/2015 03/02/2014 03/24/2013  PHQ - 2 Score 0 0 0 0 0  PHQ- 9 Score 0 0 - - -    Fall Risk Fall Risk  05/18/2020 09/10/2017 08/05/2015 03/02/2014 03/24/2013  Falls in the past year? 0 No No Yes No  Number falls in past yr: 0 - - - -  Injury with Fall? 0 - - - -  Risk for fall due to : Medication side  effect - - - -  Follow up Falls evaluation completed;Falls prevention discussed - - - -    Any stairs in or around the home? Yes  If so, are there any without handrails? No  Home free of loose throw rugs in walkways, pet beds, electrical cords, etc? Yes  Adequate lighting in your home to reduce risk of falls? Yes   ASSISTIVE DEVICES UTILIZED TO PREVENT FALLS:  Life alert? No  Use of a cane, walker or w/c? No  Grab bars in the bathroom? Yes  Shower chair or bench in shower? Yes  Elevated toilet seat or a handicapped toilet? No   TIMED UP AND GO:  Was the test performed? N/A, telephonic visit .    Cognitive Function: MMSE - Mini Mental State Exam 05/18/2020 09/10/2017  Orientation to time 5 5  Orientation to Place 5 5  Registration 3 3  Attention/ Calculation 5 0  Recall 3 3  Language- name 2 objects - 0  Language- repeat 1 1  Language- follow 3 step command - 3  Language- read & follow direction - 0  Write a sentence - 0  Copy design - 0  Total score - 20  Mini Cog  Mini-Cog screen was completed. Maximum score is 22. A value of 0 denotes this part of the MMSE was not completed or the patient failed this part of the Mini-Cog screening.       Immunizations  Immunization History  Administered Date(s) Administered  . Fluad Quad(high Dose 65+) 05/22/2019  . Influenza Split 06/01/2011, 05/03/2012  . Influenza Whole 06/15/2010  . Influenza,inj,Quad PF,6+ Mos 06/10/2013, 06/18/2014, 05/25/2015, 05/23/2016, 06/06/2017, 06/13/2018  . Influenza,inj,quad, With Preservative 05/22/2019  . Moderna SARS-COVID-2 Vaccination 11/03/2019, 11/24/2019  . Pneumococcal Conjugate-13 11/17/2014  . Pneumococcal Polysaccharide-23 08/21/1998  . Td 08/22/2003  . Tdap 04/21/2014  . Zoster 08/22/2007  . Zoster Recombinat (Shingrix) 05/01/2019, 07/29/2019    TDAP status: Up to date Flu Vaccine status: due, will get at upcoming office visit  Pneumococcal vaccine status: Up to date Covid-19  vaccine status: Completed vaccines  Qualifies for Shingles Vaccine? Yes   Zostavax completed Yes   Shingrix Completed?: Yes  Screening Tests Health Maintenance  Topic Date Due  . INFLUENZA VACCINE  03/21/2020  . TETANUS/TDAP  04/21/2024  . DEXA SCAN  Completed  . COVID-19 Vaccine  Completed  . PNA vac Low Risk Adult  Completed    Health Maintenance  Health Maintenance Due  Topic Date Due  . INFLUENZA VACCINE  03/21/2020    Colorectal cancer screening: No longer required.  Mammogram status: No longer required.  Bone Density status: declined  Lung Cancer Screening: (Low Dose CT Chest recommended if Age 54-80 years, 30 pack-year currently smoking OR have quit w/in 15 years.) does not qualify.    Additional Screening:  Hepatitis C Screening: does not qualify; Completed N/A  Vision Screening: Recommended annual ophthalmology exams for early detection of glaucoma and other disorders of the eye. Is the patient up to date with their annual eye exam?  Yes  Who is the provider or what is the name of the office in which the patient attends annual eye exams? [facility] in [place] If pt is not established with a provider, would they like to be referred to a provider to establish care? No .   Dental Screening: Recommended annual dental exams for proper oral hygiene  Community Resource Referral / Chronic Care Management: CRR required this visit?  No   CCM required this visit?  No      Plan:     I have personally reviewed and noted the following in the patient's chart:   . Medical and social history . Use of alcohol, tobacco or illicit drugs  . Current medications and supplements . Functional ability and status . Nutritional status . Physical activity . Advanced directives . List of other physicians . Hospitalizations, surgeries, and ER visits in previous 12 months . Vitals . Screenings to include cognitive, depression, and falls . Referrals and appointments  In  addition, I have reviewed and discussed with patient certain preventive protocols, quality metrics, and best practice recommendations. A written personalized care plan for preventive services as well as general preventive health recommendations were provided to patient.   Due to this being a telephonic visit, the after visit summary with patients personalized plan was offered to patient via office or my-chart. Patient preferred to pick up at office at next visit or via mychart.   Andrez Grime, LPN   0/12/1100

## 2020-05-24 ENCOUNTER — Other Ambulatory Visit (INDEPENDENT_AMBULATORY_CARE_PROVIDER_SITE_OTHER): Payer: Medicare Other

## 2020-05-24 ENCOUNTER — Encounter: Payer: Medicare Other | Admitting: Family Medicine

## 2020-05-24 ENCOUNTER — Other Ambulatory Visit: Payer: Self-pay

## 2020-05-24 DIAGNOSIS — M810 Age-related osteoporosis without current pathological fracture: Secondary | ICD-10-CM

## 2020-05-24 DIAGNOSIS — E559 Vitamin D deficiency, unspecified: Secondary | ICD-10-CM | POA: Diagnosis not present

## 2020-05-24 DIAGNOSIS — E782 Mixed hyperlipidemia: Secondary | ICD-10-CM | POA: Diagnosis not present

## 2020-05-24 LAB — VITAMIN D 25 HYDROXY (VIT D DEFICIENCY, FRACTURES): VITD: 25.02 ng/mL — ABNORMAL LOW (ref 30.00–100.00)

## 2020-05-24 LAB — TSH: TSH: 3.43 u[IU]/mL (ref 0.35–4.50)

## 2020-05-24 LAB — CBC WITH DIFFERENTIAL/PLATELET
Basophils Absolute: 0.1 10*3/uL (ref 0.0–0.1)
Basophils Relative: 0.9 % (ref 0.0–3.0)
Eosinophils Absolute: 0.2 10*3/uL (ref 0.0–0.7)
Eosinophils Relative: 2.6 % (ref 0.0–5.0)
HCT: 42.3 % (ref 36.0–46.0)
Hemoglobin: 14.3 g/dL (ref 12.0–15.0)
Lymphocytes Relative: 30.7 % (ref 12.0–46.0)
Lymphs Abs: 2.6 10*3/uL (ref 0.7–4.0)
MCHC: 33.9 g/dL (ref 30.0–36.0)
MCV: 96.5 fl (ref 78.0–100.0)
Monocytes Absolute: 0.5 10*3/uL (ref 0.1–1.0)
Monocytes Relative: 5.9 % (ref 3.0–12.0)
Neutro Abs: 5 10*3/uL (ref 1.4–7.7)
Neutrophils Relative %: 59.9 % (ref 43.0–77.0)
Platelets: 237 10*3/uL (ref 150.0–400.0)
RBC: 4.38 Mil/uL (ref 3.87–5.11)
RDW: 13.6 % (ref 11.5–15.5)
WBC: 8.4 10*3/uL (ref 4.0–10.5)

## 2020-05-24 LAB — COMPREHENSIVE METABOLIC PANEL
ALT: 14 U/L (ref 0–35)
AST: 16 U/L (ref 0–37)
Albumin: 4.4 g/dL (ref 3.5–5.2)
Alkaline Phosphatase: 85 U/L (ref 39–117)
BUN: 19 mg/dL (ref 6–23)
CO2: 32 mEq/L (ref 19–32)
Calcium: 9.5 mg/dL (ref 8.4–10.5)
Chloride: 102 mEq/L (ref 96–112)
Creatinine, Ser: 0.9 mg/dL (ref 0.40–1.20)
GFR: 59.24 mL/min — ABNORMAL LOW (ref >60.00)
Glucose, Bld: 88 mg/dL (ref 70–99)
Potassium: 4.5 mEq/L (ref 3.5–5.1)
Sodium: 142 mEq/L (ref 135–145)
Total Bilirubin: 0.7 mg/dL (ref 0.2–1.2)
Total Protein: 6.8 g/dL (ref 6.0–8.3)

## 2020-05-24 LAB — LIPID PANEL
Cholesterol: 155 mg/dL (ref 0–200)
HDL: 61.7 mg/dL (ref >39.00)
LDL Cholesterol: 73 mg/dL (ref 0–99)
NonHDL: 93.71
Total CHOL/HDL Ratio: 3
Triglycerides: 106 mg/dL (ref 0.0–149.0)
VLDL: 21.2 mg/dL (ref 0.0–40.0)

## 2020-05-31 ENCOUNTER — Encounter: Payer: Medicare Other | Admitting: Family Medicine

## 2020-06-02 ENCOUNTER — Other Ambulatory Visit: Payer: Self-pay

## 2020-06-02 ENCOUNTER — Ambulatory Visit (INDEPENDENT_AMBULATORY_CARE_PROVIDER_SITE_OTHER): Payer: Medicare Other

## 2020-06-02 DIAGNOSIS — Z23 Encounter for immunization: Secondary | ICD-10-CM

## 2020-06-10 ENCOUNTER — Encounter: Payer: Self-pay | Admitting: Family Medicine

## 2020-06-10 ENCOUNTER — Ambulatory Visit (INDEPENDENT_AMBULATORY_CARE_PROVIDER_SITE_OTHER): Payer: Medicare Other | Admitting: Family Medicine

## 2020-06-10 ENCOUNTER — Other Ambulatory Visit: Payer: Self-pay

## 2020-06-10 VITALS — BP 130/72 | HR 75 | Temp 96.8°F | Ht 60.0 in | Wt 176.4 lb

## 2020-06-10 DIAGNOSIS — I10 Essential (primary) hypertension: Secondary | ICD-10-CM

## 2020-06-10 DIAGNOSIS — Z7189 Other specified counseling: Secondary | ICD-10-CM

## 2020-06-10 DIAGNOSIS — E559 Vitamin D deficiency, unspecified: Secondary | ICD-10-CM

## 2020-06-10 DIAGNOSIS — F339 Major depressive disorder, recurrent, unspecified: Secondary | ICD-10-CM

## 2020-06-10 DIAGNOSIS — E782 Mixed hyperlipidemia: Secondary | ICD-10-CM | POA: Diagnosis not present

## 2020-06-10 DIAGNOSIS — Z0000 Encounter for general adult medical examination without abnormal findings: Secondary | ICD-10-CM

## 2020-06-10 MED ORDER — VITAMIN D 50 MCG (2000 UT) PO CAPS: 4000.0000 [IU] | ORAL_CAPSULE | Freq: Every day | ORAL | Status: DC

## 2020-06-10 MED ORDER — GABAPENTIN 300 MG PO CAPS: 300.0000 mg | ORAL_CAPSULE | ORAL | Status: DC

## 2020-06-10 NOTE — Patient Instructions (Addendum)
You can call for a mammogram at [facility] [address] [place]  Increase your vitamin D and update me as needed.  Take care.  Glad to see you.

## 2020-06-10 NOTE — Progress Notes (Signed)
This visit occurred during the SARS-CoV-2 public health emergency.  Safety protocols were in place, including screening questions prior to the visit, additional usage of staff PPE, and extensive cleaning of exam room while observing appropriate contact time as indicated for disinfecting solutions.  Mood d/w pt.  Per outside clinic.  Dr. Wynonia Lawman retired and she is seeing a new provider.  Mood is good.  Sleeping well.  Compliant with current medications.  I will defer.  She agrees.  Elevated Cholesterol: Using medications without problems: yes Muscle aches:  Some occ cramping at rest.  No recent use of baclofen.  Diet compliance: yes, diet d/w pt.  Exercise: encouraged.   Still on praluent.  No ADE on med.    Hypertension:    Using medication without problems or lightheadedness: yes Chest pain with exertion:no Edema:no Short of breath:no Labs d/w pt.    Low vit D d/w pt.  D/w pt.    Tetanus 2015 Flu shot 2021 PNA shot up to date.   shingrix utd covid vaccine d/w pt.   Colon screening not due.  Pap not due.   DXA declined.  D/w pt.   Mammogram d/w pt.  She is considering if she would want to go through with treatment if screen were abnormal.  See avs.   Advance directive d/w pt. Husband designated if patient were incapacitated. If he is also incapacitated, then her kids are equally designated.  PMH and SH reviewed ROS: Per HPI unless specifically indicated in ROS section   Meds, vitals, and allergies reviewed.   GEN: nad, alert and oriented HEENT: ncat NECK: supple w/o LA CV: rrr. PULM: ctab, no inc wob ABD: soft, +bs EXT: no edema SKIN: no acute rash

## 2020-06-13 NOTE — Assessment & Plan Note (Signed)
Would continue lisinopril and metoprolol.  Labs discussed with patient.

## 2020-06-13 NOTE — Assessment & Plan Note (Signed)
Mood d/w pt.  Per outside clinic.  Dr. Wynonia Lawman retired and she is seeing a new provider.  Mood is good.  Sleeping well.  Compliant with current medications.  I will defer.  She agrees.

## 2020-06-13 NOTE — Assessment & Plan Note (Signed)
Would increase vitamin D to 4000 units daily.  She agrees.

## 2020-06-13 NOTE — Assessment & Plan Note (Signed)
Using medications without problems: yes Muscle aches:  Some occ cramping at rest.  No recent use of baclofen.  Diet compliance: yes, diet d/w pt.  Exercise: encouraged.   Still on praluent.  No ADE on med.   Would continue Praluent.

## 2020-06-13 NOTE — Assessment & Plan Note (Signed)
Advance directive d/w pt. Husband designated if patient were incapacitated.  If he is also incapacitated, then her kids are equally designated.   

## 2020-06-13 NOTE — Assessment & Plan Note (Signed)
Tetanus 2015 Flu shot 2021 PNA shot up to date.   shingrix utd covid vaccine d/w pt.   Colon screening not due.  Pap not due.   DXA declined.  D/w pt.   Mammogram d/w pt.  She is considering if she would want to go through with treatment if screen were abnormal.  See avs.   Advance directive d/w pt. Husband designated if patient were incapacitated. If he is also incapacitated, then her kids are equally designated.

## 2020-07-18 ENCOUNTER — Other Ambulatory Visit: Payer: Self-pay | Admitting: Cardiovascular Disease

## 2020-07-19 ENCOUNTER — Other Ambulatory Visit: Payer: Self-pay | Admitting: Cardiovascular Disease

## 2020-07-19 MED ORDER — LISINOPRIL 5 MG PO TABS
5.0000 mg | ORAL_TABLET | Freq: Every day | ORAL | 3 refills | Status: DC
Start: 2020-07-19 — End: 2020-09-30

## 2020-07-19 NOTE — Telephone Encounter (Signed)
 *  STAT* If patient is at the pharmacy, call can be transferred to refill team.   1. Which medications need to be refilled? (please list name of each medication and dose if known)   lisinopril (ZESTRIL) 5 MG tablet  2. Which pharmacy/location (including street and city if local pharmacy) is medication to be sent to?  [facility] - [place], [place] - [address], [address]  3. Do they need a 30 day or 90 day supply? 90 day  Patient has appt made for earliest available on 09/21/20. Advised patient she must keep appointment for future refills.

## 2020-07-20 ENCOUNTER — Encounter: Payer: Self-pay | Admitting: Family Medicine

## 2020-07-26 ENCOUNTER — Telehealth: Payer: Self-pay

## 2020-07-26 NOTE — Telephone Encounter (Signed)
Called and spoke with patient regarding normal mammogram results. Patient verbalized understanding.

## 2020-09-08 ENCOUNTER — Telehealth: Payer: Self-pay

## 2020-09-08 MED ORDER — BACLOFEN 5 MG PO TABS: ORAL_TABLET | ORAL | 1 refills | Status: DC

## 2020-09-08 NOTE — Telephone Encounter (Signed)
Sent. Thanks.   

## 2020-09-08 NOTE — Telephone Encounter (Signed)
Mr [facility] signed) left v/m that pt mixed new and old baclofen (muscle relaxer together) and pharmacist advised if cannot tell difference in pills should throw away all pills and request a refill from PCP. I spoke with pt; pt does have new rx for baclofen that she can take but pt was requesting another refill to take the place of the refill available that pt is having to throw away.Pt is requesting one refill of [facility]en [facility]. Pt said she has about # 85 of baclofen she can take at this time but wants the refill [facility] so when this # 85 runs out she can have an available refill. Pt is on auto refill with [facility] and pt does not take med every day and that is why has extra pills now.   Name of Medication: baclofen 5 mg [facility] of Pharmacy[facility] Last Fill or Written Date and Quantity: # 90 x 1 on 05/12/20 Last Office Visit and Type:06/10/20 annual  Next Office Visit and Type:none scheduled

## 2020-09-08 NOTE — Telephone Encounter (Signed)
Patient aware new rx was sent to pharmacy 

## 2020-09-21 ENCOUNTER — Other Ambulatory Visit: Payer: Self-pay

## 2020-09-21 ENCOUNTER — Encounter: Payer: Self-pay | Admitting: Cardiovascular Disease

## 2020-09-21 ENCOUNTER — Ambulatory Visit: Payer: Medicare Other | Admitting: Cardiovascular Disease

## 2020-09-21 VITALS — BP 128/80 | HR 76 | Ht 61.0 in | Wt 186.0 lb

## 2020-09-21 DIAGNOSIS — I10 Essential (primary) hypertension: Secondary | ICD-10-CM | POA: Diagnosis not present

## 2020-09-21 DIAGNOSIS — I208 Other forms of angina pectoris: Secondary | ICD-10-CM

## 2020-09-21 DIAGNOSIS — I2089 Other forms of angina pectoris: Secondary | ICD-10-CM

## 2020-09-21 DIAGNOSIS — E782 Mixed hyperlipidemia: Secondary | ICD-10-CM

## 2020-09-21 DIAGNOSIS — I2510 Atherosclerotic heart disease of native coronary artery without angina pectoris: Secondary | ICD-10-CM | POA: Insufficient documentation

## 2020-09-21 NOTE — Assessment & Plan Note (Signed)
History of noncritical CAD by cardiac cath performed by myself 09/03/2014 with a 50% mid LAD stenosis after a moderate sized diagonal branch with otherwise normal coronary arteries.  I thought her pain was noncardiac.  She did have a CT FFR that suggested disease in the LAD territory underwent cardiac catheterization the following day on 06/15/2017 by Dr. Tresa Endo which again showed noncritical CAD.  She currently denies chest pain.

## 2020-09-21 NOTE — Patient Instructions (Signed)

## 2020-09-21 NOTE — Assessment & Plan Note (Signed)
History of hyperlipidemia on Praluent with lipid profile performed 05/24/2020 revealing total cholesterol of 155, LDL 73 and HDL of 61.

## 2020-09-21 NOTE — Assessment & Plan Note (Signed)
History of essential hypertension a blood pressure measured today 140/90.  She is on metoprolol and lisinopril.

## 2020-09-21 NOTE — Progress Notes (Signed)
09/21/2020 Robin Juarez   [birth year]  [id number]  Primary Physician Tonia Ghent, MD Primary Cardiologist: Lorretta Harp MD Robin Juarez, [place]  HPI:  Robin Juarez is a 87 y.o.    moderately overweight married Caucasian female mother of 4 children, grandmother of 26 grandchildren who self-referred for evaluation of chest pain. I last saw her in the office  09/25/2018.Her primary care physician is Dr. Elsie Stain. She took up in by one of her daughters Robin Juarez who I know. Her cardiac risk factors are notable for family history with brother does have coronary artery bypass grafting. She probably does have hyperlipidemia and hypertension which is not treated. She began having chest pain 2 weeks ago which is left precordial and radiates to her back. There are no other associated symptoms. She also initially had strokelike symptoms and had an MRI that showed a subacute stroke in the corona radiata. She had a Myoview stress test that was low risk but that show subtle anteroapical ischemia. I performed cardiac catheterization on her 09/03/14 through the right femoral approach revealing at most 50% mid LAD stenosis after a moderate sized diagonal branch with otherwise normal coronary arteries and normal LV function. I thought her chest pain was noncardiac. Since I saw her in the office a year ago she's remained clinically stableuntil recently when she was admitted to [facility] on 06/13/17 for chest pain. She ruled out for myocardial infarction. Her echo was normal with grade 1 diastolic dysfunction. A CT FFR suggested disease in the IV territory which led to a cardiac catheterization by Dr. Claiborne Billings the same day revealing noncritical CAD.  Since I saw her  in the office 2 years ago she continues to do well.  She denies chest pain or shortness of breath.  H.   Current Meds  Medication Sig  . Alirocumab (PRALUENT) 75 MG/ML SOAJ Inject 75 mg into the skin every 14  (fourteen) days.  . Baclofen 5 MG TABS TAKE 1 TABLET (5 MG) BY MOUTH DAILY AS NEEDED (FOR MUSCLE SPASMS).  . busPIRone (BUSPAR) 10 MG tablet as needed.  . Cholecalciferol (VITAMIN D) 50 MCG (2000 UT) CAPS Take 2 capsules (4,000 Units total) by mouth daily.  . Coenzyme Q10 (CO Q 10 PO) Take 1 tablet by mouth daily.  . diclofenac sodium (VOLTAREN) 1 % GEL Apply 2 g topically 4 (four) times daily as needed (for joint aches).  . DULoxetine (CYMBALTA) 30 MG capsule Take 90 mg by mouth daily.   . folic acid (FOLVITE) 979 MCG tablet Take 800 mcg by mouth daily.   [name] gabapentin (NEURONTIN) 300 MG capsule Take 1-2 capsules (300-600 mg total) by mouth See admin instructions. 300 mg at lunch and supper and 600 mg in the evening  . KRILL OIL PO Take 100 mg by mouth daily.  [name] lamoTRIgine (LAMICTAL) 100 MG tablet Take 1 tablet (100 mg total) by mouth daily after supper.  [name] lisinopril (ZESTRIL) 5 MG tablet Take 1 tablet (5 mg total) by mouth daily.  . metoprolol succinate (TOPROL-XL) 25 MG 24 hr tablet TAKE 1 TABLET BY MOUTH EVERY DAY  . OLANZapine (ZYPREXA) 2.5 MG tablet Take 1 tablet (2.5 mg total) by mouth at bedtime.     Allergies  Allergen Reactions  . Digoxin Other (See Comments)    Felt weak, tired, "awful"  . Nitrofurantoin Other (See Comments)    Sharp pain back of head  . Alendronate Sodium Other (See  Comments)    cramps  . Cephalosporins     She "felt bad" while taking multiple cephalosporins but has been able tolerate amoxicillin previously.  No clear cephalosporin allergy.  . Chlordiazepoxide Hcl Other (See Comments)  . Phenobarbital-Belladonna Alk Other (See Comments)  . Ritalin [Methylphenidate Hcl]     intolerant  . Statins     Myalgia.  Intolerant of multiple statins, including pravastatin and simvastatin.  . Chlordiazepoxide Other (See Comments)  . Clavulanic Acid Other (See Comments)    Not an allergy.  Tolerates plain amoxil but not augmentin- diarrhea, etc with augmentin  .  Omeprazole Rash    rash  . Oxcarbazepine Other (See Comments)    unknown  . Sulfa Antibiotics Other (See Comments)    Sharp pain in back of head  . Tetracycline Itching    itching  . Zetia [Ezetimibe] Other (See Comments)    possible cause of aches and abnormal dreams, stopped 05/2014    Social History   Socioeconomic History  . Marital status: Married    Spouse name: Not on file  . Number of children: 4  . Years of education: Not on file  . Highest education level: Not on file  Occupational History  . Occupation: RETIRED    Employer: RETIRED  Tobacco Use  . Smoking status: Never Smoker  . Smokeless tobacco: Never Used  . Tobacco comment: a small amount when 87 yo  Vaping Use  . Vaping Use: Never used  Substance and Sexual Activity  . Alcohol use: No    Alcohol/week: 0.0 standard drinks  . Drug use: No  . Sexual activity: Not Currently  Other Topics Concern  . Not on file  Social History Narrative   Married 1953, husband is well.   5 pregnancies, 4 live births, all well (5th was unexpect pregnancy and patient needed to terminate due to concurrent medical problems at the time)   11 grandchildren.  9 great grandchildren   Regular exercise:  No   Social Determinants of Health   Financial Resource Strain: Low Risk   . Difficulty of Paying Living Expenses: Not hard at all  Food Insecurity: No Food Insecurity  . Worried About Charity fundraiser in the Last Year: Never true  . Ran Out of Food in the Last Year: Never true  Transportation Needs: No Transportation Needs  . Lack of Transportation (Medical): No  . Lack of Transportation (Non-Medical): No  Physical Activity: Inactive  . Days of Exercise per Week: 0 days  . Minutes of Exercise per Session: 0 min  Stress: No Stress Concern Present  . Feeling of Stress : Not at all  Social Connections: Not on file  Intimate Partner Violence: Not At Risk  . Fear of Current or Ex-Partner: No  . Emotionally Abused: No  .  Physically Abused: No  . Sexually Abused: No     Review of Systems: General: negative for chills, fever, night sweats or weight changes.  Cardiovascular: negative for chest pain, dyspnea on exertion, edema, orthopnea, palpitations, paroxysmal nocturnal dyspnea or shortness of breath Dermatological: negative for rash Respiratory: negative for cough or wheezing Urologic: negative for hematuria Abdominal: negative for nausea, vomiting, diarrhea, bright red blood per rectum, melena, or hematemesis Neurologic: negative for visual changes, syncope, or dizziness All other systems reviewed and are otherwise negative except as noted above.    Blood pressure 140/90, pulse 76, height _0  (1.549 m), weight 186 lb (84.4 kg).  General appearance: alert and  no distress Neck: no adenopathy, no carotid bruit, no JVD, supple, symmetrical, trachea midline and thyroid not enlarged, symmetric, no tenderness/mass/nodules Lungs: clear to auscultation bilaterally Heart: regular rate and rhythm, S1, S2 normal, no murmur, click, rub or gallop Extremities: extremities normal, atraumatic, no cyanosis or edema Pulses: 2+ and symmetric Skin: Skin color, texture, turgor normal. No rashes or lesions Neurologic: Alert and oriented X 3, normal strength and tone. Normal symmetric reflexes. Normal coordination and gait  EKG sinus rhythm at 76 without ST or T wave changes.  Personally reviewed this EKG.  ASSESSMENT AND PLAN:   HLD (hyperlipidemia) History of hyperlipidemia on Praluent with lipid profile performed 05/24/2020 revealing total cholesterol of 155, LDL 73 and HDL of 61.  Essential hypertension History of essential hypertension a blood pressure measured today 140/90.  She is on metoprolol and lisinopril.  Coronary artery disease History of noncritical CAD by cardiac cath performed by myself 09/03/2014 with a 50% mid LAD stenosis after a moderate sized diagonal branch with otherwise normal coronary  arteries.  I thought her pain was noncardiac.  She did have a CT FFR that suggested disease in the LAD territory underwent cardiac catheterization the following day on 06/15/2017 by Dr. Claiborne Billings which again showed noncritical CAD.  She currently denies chest pain.      Lorretta Harp MD FACP,FACC,FAHA, [facility] 09/21/2020 1:43 PM

## 2020-09-30 ENCOUNTER — Other Ambulatory Visit: Payer: Self-pay | Admitting: Cardiovascular Disease

## 2020-09-30 ENCOUNTER — Telehealth: Payer: Self-pay | Admitting: Cardiovascular Disease

## 2020-09-30 MED ORDER — LISINOPRIL 5 MG PO TABS: 5.0000 mg | ORAL_TABLET | Freq: Every day | ORAL | 3 refills | Status: DC

## 2020-09-30 NOTE — Telephone Encounter (Signed)
*  STAT* If patient is at the pharmacy, call can be transferred to refill team.   1. Which medications need to be refilled? (please list name of each medication and dose if known)  metoprolol succinate (TOPROL-XL) 25 MG 24 hr tablet lisinopril (ZESTRIL) 5 MG tablet  2. Which pharmacy/location (including street and city if local pharmacy) is medication to be sent to? [facility] - [place], [place] - [address], [address]  3. Do they need a 30 day or 90 day supply? 90 day supply

## 2020-11-18 ENCOUNTER — Other Ambulatory Visit: Payer: Self-pay

## 2020-11-18 MED ORDER — PRALUENT 75 MG/ML ~~LOC~~ SOAJ: 75.0000 mg | SUBCUTANEOUS | 3 refills | Status: DC

## 2020-12-15 ENCOUNTER — Telehealth: Payer: Self-pay

## 2020-12-15 NOTE — Telephone Encounter (Signed)
If her symptoms have resolved, I would make sure she stays well-hydrated and seek evaluation if she has recurrent symptoms.  She could have had relatively low blood pressure at the time when she was symptomatic and she may need to decrease her lisinopril in the future, if that is the issue.  Thanks.

## 2020-12-15 NOTE — Telephone Encounter (Signed)
[facility] - Client TELEPHONE ADVICE RECORD [facility] Patient Name: Robin Juarez [facility] Gender: Female DOB: 04-Dec-1930 Age: 87 Y 5 M 28 D Return Phone Number: [phone] (Primary), [phone] (Secondary) Address: City/ State/ Zip: [place] [address] Client [facility] - Client Client Site [facility] - Day Physician Raechel Ache - MD Contact Type Call Who Is Calling Patient / Member / Family / Caregiver Call Type Triage / Clinical Relationship To Patient Self Return Phone Number [phone] (Primary) Chief Complaint Diarrhea Reason for Call Symptomatic / Request for Health Information Initial Comment Caller states she is not feeling well. She has had diarrhea, and she had a strange feeling that has not gone away. She has not been feeling well for 4 days. She almost fainted, and has not felt right since. She said her head feels like something is wrong, but no headache. Translation No Nurse Assessment Nurse: Stefano Gaul, RN, Dwana Curd Date/Time (Eastern Time): 12/15/2020 2:39:40 PM Confirm and document reason for call. If symptomatic, describe symptoms. ---Caller states she has not been herself for 4 days. had diarrhea on Monday x 2 and x 1 today. no abd pain. She was a little lightheaded yesterday. She was hot and sweaty on Monday after spending time outside. She felt faint on Monday and did not actually pass out. no headache. no cough or congestion. She has been weak. no fever. Does the patient have any new or worsening symptoms? ---Yes Will a triage be completed? ---Yes Related visit to physician within the last 2 weeks? ---No Does the PT have any chronic conditions? (i.e. diabetes, asthma, this includes High risk factors for pregnancy, etc.) ---Yes List chronic conditions. ---HTN Is this a behavioral health or substance abuse call? ---No Guidelines Guideline Title Affirmed Question  Affirmed Notes Nurse Date/Time Lamount Cohen Time) Weakness (Generalized) and Fatigue Pale skin (pallor) Stefano Gaul, RN, Dwana Curd 12/15/2020 2:46:47 PM PLEASE NOTE: All timestamps contained within this report are represented as [place] Standard Time. CONFIDENTIALTY NOTICE: This fax transmission is intended only for the addressee. It contains information that is legally privileged, confidential or otherwise protected from use or disclosure. If you are not the intended recipient, you are strictly prohibited from reviewing, disclosing, copying using or disseminating any of this information or taking any action in reliance on or regarding this information. If you have received this fax in error, please notify us immediately by telephone so that we can arrange for its return to [place]. Phone: [phone], Toll-Free: [phone], Fax: [phone] Page: 2 of 2 Call Id: [id number] Disp. Time Lamount Cohen Time) Disposition Final User 12/15/2020 2:21:55 PM Attempt made - no message left Wallace Going 12/15/2020 2:23:23 PM Attempt made - message left Stefano Gaul, RN, Dwana Curd 12/15/2020 2:52:41 PM See HCP within 4 Hours (or PCP triage) Yes Stefano Gaul, RN, Clerance Lav Disagree/Comply Disagree Caller Understands Yes PreDisposition Call Doctor Care Advice Given Per Guideline SEE HCP (OR PCP TRIAGE) WITHIN 4 HOURS: * IF OFFICE WILL BE OPEN: You need to be seen within the next 3 or 4 hours. Call your doctor (or NP/PA) now or as soon as the office opens. CALL BACK IF: * You become worse CARE ADVICE given per Weakness and Fatigue (Adult) guideline. Comments User: Art Buff, RN Date/Time Lamount Cohen Time): 12/15/2020 2:21:41 PM Called primary number with no answer to the phone. Will try secondary number. User: Art Buff, RN Date/Time Lamount Cohen Time): 12/15/2020 2:22:52 PM Called secondary number and left message. will try primary number again in  a few min. User: Art Buff, RN Date/Time Lamount Cohen Time): 12/15/2020  2:56:13 PM called office and gave report to [facility] that pt has a 4 hr outcome. No appts available for today. pt does not want to go to urgent care or the ER Referrals GO TO FACILITY REFUSED

## 2020-12-15 NOTE — Telephone Encounter (Signed)
I spoke with pt; pt said on 12/13/20 pt was outside and planting a small tree; pt did digging and after tree was planted pt did not feel right and went inside because she felt like she might pass out. Pt sat down and rested and cooled off. Pt said she actually did not feel right a day or two before planting the tree on Mon. Pt can not describe the feeling in her stomach or head that does not feel right. Pt said she has had no CP or h/a or abd pain and no SOB. Pt said she has just not felt right in her stomach or head. pts daughter was going to take her to [facility] hospital this morning and pt said the feeling she cannot describe was gone this morning so pt did not go to ED. Pt did say EMS came out on 12/14/20 not to take pt to ED but just to ck pt out. Pt said the BP was slightly elevated;could not remember value and pt has not missed taking her BP meds. EKG,O2 level and T were normal. Pt said she was slightly lightheaded for short period on 12/14/20 but that went away. Pt has felt generalized weakness but no weakness in arms or legs. Now BP is 156/73 and P 72. UC & ED precautions given and pt voiced understanding but said she did not want to go to UC or ED at this time; if pt condition changes or worsens pt will go to ED. No available appts at [facility] on 12/15/20 or 12/16/20 and offered pt appt on 12/17/20 but pt said she has previous appt already scheduled on 12/17/20. Pt said to send note to Dr Para March to see if he had any ideas of what pt should do. Sending note to Dr Para March who is out of office, Dr Reece Agar and Shanda Bumps CMA.

## 2020-12-15 NOTE — Telephone Encounter (Signed)
Tried to call patient and phone rang and no answering machine. Will have to try and call patient back later.

## 2020-12-20 NOTE — Telephone Encounter (Signed)
Spoke with patient to get an update on how she is feeling. Patient states she is feeling much much better now. She has not had any more sx or issues since she called our office.

## 2021-01-24 ENCOUNTER — Telehealth: Payer: Self-pay

## 2021-01-24 NOTE — Telephone Encounter (Signed)
Patient called stating that she has had pain in her right side radiating to the back on the right the past week. Sometimes it is a sharp/grabbing pain in the right side of her chest-off and on. The past 3 days pain has gotten better but today is more intense. Patient did just finish taking antibiotic for urinary infection 2 days ago.

## 2021-01-24 NOTE — Telephone Encounter (Signed)
Pt c/o R sharp flank pain, unable to rate on a scale for 4 days. She was unable to rate on a scale but did say it was "prettry bad today." Also intermittent R upper chest pain, rated at an 8 and described as "hard." pt reports this occurring yesterday 2-3 times and today once, and could not advise to how long the pain lasted. Pt denied SOB, radiating arm or jaw pain. Pt reports she did start a new medication the end of last week for OAB, mybetriq. She contacted her urologist to see if it may be the medication and he advised to stop taking it over the weekend and if it got better it was the med and if it did not then she can restart the med. Pt reports no change with stopping the med in symptoms, so she restarted the med today.  Advised pt it would be best to be examined today and not wait to see PCP tomorrow. Advised pt to go to UC. Gave pt and her husband instructions for UC in [place]. Pt agreed to go to UC today. Advised PCP would be able to see notes from UC and if anything else is needed to contact the office. Advised if any symptoms changed or worsened to go the ER. Pt verbalized understanding.

## 2021-01-25 NOTE — Telephone Encounter (Signed)
Noted. Thanks. Will await UC notes.  

## 2021-01-27 ENCOUNTER — Ambulatory Visit: Payer: Medicare Other | Admitting: Family Medicine

## 2021-01-28 ENCOUNTER — Ambulatory Visit: Payer: Medicare Other | Admitting: Family Medicine

## 2021-01-31 ENCOUNTER — Encounter (HOSPITAL_COMMUNITY): Payer: Self-pay

## 2021-01-31 ENCOUNTER — Ambulatory Visit: Payer: Medicare Other | Admitting: Family Medicine

## 2021-01-31 ENCOUNTER — Emergency Department (HOSPITAL_COMMUNITY)
Admission: EM | Admit: 2021-01-31 | Discharge: 2021-01-31 | Disposition: A | Discharge status: Home / Self Care | Payer: Medicare Other | Attending: Emergency Medicine | Admitting: Emergency Medicine

## 2021-01-31 ENCOUNTER — Other Ambulatory Visit: Payer: Self-pay

## 2021-01-31 ENCOUNTER — Emergency Department (HOSPITAL_COMMUNITY): Payer: Medicare Other

## 2021-01-31 DIAGNOSIS — R1011 Right upper quadrant pain: Secondary | ICD-10-CM | POA: Diagnosis not present

## 2021-01-31 DIAGNOSIS — I2510 Atherosclerotic heart disease of native coronary artery without angina pectoris: Secondary | ICD-10-CM | POA: Insufficient documentation

## 2021-01-31 DIAGNOSIS — Z79899 Other long term (current) drug therapy: Secondary | ICD-10-CM | POA: Diagnosis not present

## 2021-01-31 DIAGNOSIS — M549 Dorsalgia, unspecified: Secondary | ICD-10-CM | POA: Insufficient documentation

## 2021-01-31 DIAGNOSIS — R109 Unspecified abdominal pain: Secondary | ICD-10-CM | POA: Diagnosis present

## 2021-01-31 DIAGNOSIS — K219 Gastro-esophageal reflux disease without esophagitis: Secondary | ICD-10-CM | POA: Diagnosis not present

## 2021-01-31 DIAGNOSIS — I10 Essential (primary) hypertension: Secondary | ICD-10-CM | POA: Insufficient documentation

## 2021-01-31 DIAGNOSIS — K8020 Calculus of gallbladder without cholecystitis without obstruction: Secondary | ICD-10-CM

## 2021-01-31 LAB — COMPREHENSIVE METABOLIC PANEL
ALT: 17 U/L (ref 0–44)
AST: 27 U/L (ref 15–41)
Albumin: 4.2 g/dL (ref 3.5–5.0)
Alkaline Phosphatase: 81 U/L (ref 38–126)
Anion gap: 8 (ref 5–15)
BUN: 19 mg/dL (ref 8–23)
CO2: 29 mmol/L (ref 22–32)
Calcium: 9.5 mg/dL (ref 8.9–10.3)
Chloride: 103 mmol/L (ref 98–111)
Creatinine, Ser: 0.98 mg/dL (ref 0.44–1.00)
GFR, Estimated: 56 mL/min — ABNORMAL LOW (ref >60)
Glucose, Bld: 104 mg/dL — ABNORMAL HIGH (ref 70–99)
Potassium: 4.6 mmol/L (ref 3.5–5.1)
Sodium: 140 mmol/L (ref 135–145)
Total Bilirubin: 0.3 mg/dL (ref 0.3–1.2)
Total Protein: 8 g/dL (ref 6.5–8.1)

## 2021-01-31 LAB — CBC WITH DIFFERENTIAL/PLATELET
Abs Immature Granulocytes: 0.18 10*3/uL — ABNORMAL HIGH (ref 0.00–0.07)
Basophils Absolute: 0.1 10*3/uL (ref 0.0–0.1)
Basophils Relative: 1 %
Eosinophils Absolute: 0 10*3/uL (ref 0.0–0.5)
Eosinophils Relative: 0 %
HCT: 46.7 % — ABNORMAL HIGH (ref 36.0–46.0)
Hemoglobin: 15.3 g/dL — ABNORMAL HIGH (ref 12.0–15.0)
Immature Granulocytes: 2 %
Lymphocytes Relative: 11 %
Lymphs Abs: 1.3 10*3/uL (ref 0.7–4.0)
MCH: 32.4 pg (ref 26.0–34.0)
MCHC: 32.8 g/dL (ref 30.0–36.0)
MCV: 98.9 fL (ref 80.0–100.0)
Monocytes Absolute: 0.4 10*3/uL (ref 0.1–1.0)
Monocytes Relative: 4 %
Neutro Abs: 9.8 10*3/uL — ABNORMAL HIGH (ref 1.7–7.7)
Neutrophils Relative %: 82 %
Platelets: 269 10*3/uL (ref 150–400)
RBC: 4.72 MIL/uL (ref 3.87–5.11)
RDW: 13.3 % (ref 11.5–15.5)
WBC: 11.8 10*3/uL — ABNORMAL HIGH (ref 4.0–10.5)
nRBC: 0 % (ref 0.0–0.2)

## 2021-01-31 LAB — URINALYSIS, ROUTINE W REFLEX MICROSCOPIC
Bilirubin Urine: NEGATIVE
Glucose, UA: NEGATIVE mg/dL
Hgb urine dipstick: NEGATIVE
Ketones, ur: NEGATIVE mg/dL
Nitrite: NEGATIVE
Protein, ur: NEGATIVE mg/dL
Specific Gravity, Urine: 1.008 (ref 1.005–1.030)
pH: 7 (ref 5.0–8.0)

## 2021-01-31 LAB — LIPASE, BLOOD: Lipase: 26 U/L (ref 11–51)

## 2021-01-31 MED ORDER — ONDANSETRON 8 MG PO TBDP
8.0000 mg | ORAL_TABLET | Freq: Once | ORAL | Status: AC
  Administered 2021-01-31: 8 mg via ORAL
  Filled 2021-01-31: qty 1

## 2021-01-31 MED ORDER — HYDROCODONE-ACETAMINOPHEN 5-325 MG PO TABS
1.0000 | ORAL_TABLET | Freq: Once | ORAL | Status: AC
  Administered 2021-01-31: 1 via ORAL
  Filled 2021-01-31: qty 1

## 2021-01-31 MED ORDER — HYDROCODONE-ACETAMINOPHEN 5-325 MG PO TABS: 1.0000 | ORAL_TABLET | Freq: Four times a day (QID) | ORAL | 0 refills | Status: DC | PRN

## 2021-01-31 NOTE — ED Triage Notes (Signed)
Patient BIB [facility] from home. Patient woke up and has bilateral leg numbness and right sided flank pain. Patient alert and oriented x4.   EMS vitals 180/100 HR 67 116 CBG 18 RR 98% Room Air

## 2021-01-31 NOTE — ED Provider Notes (Signed)
[facility] DEPT Provider Note   CSN: [id number] Arrival date & time: 01/31/21  0426     History Chief Complaint  Patient presents with   Flank Pain    Robin Juarez is a 87 y.o. female.  The history is provided by the patient.  Flank Pain This is a new problem. The current episode started more than 2 days ago. The problem occurs daily. The problem has been gradually worsening. Associated symptoms include abdominal pain. Pertinent negatives include no chest pain and no shortness of breath. The symptoms are aggravated by walking (movement). The symptoms are relieved by rest.  Patient with history of fibromyalgia, hypertension, hyperlipidemia presents with multiple complaints.  Patient reports the past 3 days she has had right-sided flank pain.  At times she will have some abdominal pain.  She reports a similar previous episode where she required admission for back pain.  She reports being seen at our walk-in clinic and was given prednisone and a muscle relaxant.  She reports the pain continues.  She also reports she recently started myrbetriq for urinary incontinence about a month ago.  She reports recently treated for  a UTI and was on Cipro.  No fevers or vomiting.  She reports tonight she woke up and she just "did not feel well" and had numbness in bilateral thighs.  She also reports that her flank pain was worsening.  Patient is concerned that her medications are causing an adverse reaction    Past Medical History:  Diagnosis Date   Arthritis    knee and hip OA- prev injection by Dr. Nelva Bush   Blood transfusion without reported diagnosis    37 yrs ago -s/p back surgery   Chest pain    Coronary artery disease    50% mid LAD by cath January 2016   Depression 1993, 2000   Fibromyalgia    GERD (gastroesophageal reflux disease)    H/O hiatal hernia    Hyperlipidemia    Hypertension    OSA (obstructive sleep apnea)    off tx as of 2020, no sx  as of 2020   Osteoporosis    Positive cardiac stress test    Stroke [facility])    Urinary incontinence    UTI (lower urinary tract infection)     Patient Active Problem List   Diagnosis Date Noted   Coronary artery disease 09/21/2020   SOB (shortness of breath) 03/21/2020   Health care maintenance 05/22/2019   Vitamin D deficiency 05/22/2019   Cough 04/02/2019   Chest pain 06/14/2017   Left hip pain 12/20/2016   Skin change 03/03/2016   Leg length discrepancy 11/19/2014   Dysuria 10/25/2014   Positive cardiac stress test    Essential hypertension 08/19/2014   Atypical chest pain 08/18/2014   CVA (cerebral infarction) 08/18/2014   Balance problem 07/14/2014   Elevated blood pressure (not hypertension) 06/10/2014   Advance care planning 03/02/2014   Spinal stenosis, lumbar region, with neurogenic claudication 05/01/2013   Low back pain 04/17/2013   Paresthesia 08/21/2012   Tremor 08/21/2012   OSA (obstructive sleep apnea) 08/07/2012   Medicare annual wellness visit, subsequent 01/12/2012   Thumb pain 08/25/2011   Weakness 04/27/2011   HYPERGLYCEMIA, MILD 09/12/2010   DISORDER OF BONE AND CARTILAGE UNSPECIFIED 05/23/2010   Anxiety state 03/01/2010   ESOPHAGEAL SPASM 03/01/2010   HIATAL HERNIA WITH REFLUX 03/01/2010   GENERALIZED OSTEOARTHROSIS UNSPECIFIED SITE 03/01/2010   RHEUMATISM UNSPECIFIED AND FIBROSITIS 03/01/2010   Myalgia 03/01/2010  OSTEOPOROSIS 03/01/2010   HLD (hyperlipidemia) 02/25/2010   MDD (major depressive disorder) 02/25/2010   GERD 02/25/2010   URINARY INCONTINENCE 02/25/2010   UTI'S, HX OF 02/25/2010    Past Surgical History:  Procedure Laterality Date   [place]   Back fusions  1977   missing vertebra   BACK SURGERY     fusions lumbar area   [place]   x 2   BREAST BIOPSY  07/10/2012   Procedure: BREAST BIOPSY WITH NEEDLE LOCALIZATION;  Surgeon: Rolm Bookbinder, MD;  Location: [facility];  Service: General;  Laterality: Right;   CARDIOVASCULAR STRESS TEST  2009   Normal, Dr. Radford Pax   CATARACT EXTRACTION, BILATERAL Bilateral 2005   Childbirth  1952,1956,1957,1960   CYSTOSCOPY N/A 11/25/2012   Procedure: CYSTOSCOPY;  Surgeon: Reece Packer, MD;  Location: [facility];  Service: Urology;  Laterality: N/A;   dilatation and currettage  1964   LEFT HEART CATH AND CORONARY ANGIOGRAPHY N/A 06/15/2017   Procedure: LEFT HEART CATH AND CORONARY ANGIOGRAPHY;  Surgeon: Troy Sine, MD;  Location: [facility];  Service: Cardiovascular;  Laterality: N/A;   LEFT HEART CATHETERIZATION WITH CORONARY ANGIOGRAM N/A 09/03/2014   Procedure: LEFT HEART CATHETERIZATION WITH CORONARY ANGIOGRAM;  Surgeon: Lorretta Harp, MD;  Location: [facility] CATH LAB;  Service: Cardiovascular;  Laterality: N/A;   LUMBAR LAMINECTOMY/DECOMPRESSION MICRODISCECTOMY N/A 05/01/2013   Procedure: COMPLETE DECOMPRESSIVE LUMBAR LAMINECTOMY L3-L4 MICRODISCECTOMY L3-L4 ON THE LEFT;  Surgeon: Tobi Bastos, MD;  Location: [facility];  Service: Orthopedics;  Laterality: N/A;   OVARIAN CYST REMOVAL  1964   PUBOVAGINAL SLING N/A 11/25/2012   Procedure: Gaynelle Arabian;  Surgeon: Reece Packer, MD;  Location: [facility];  Service: Urology;  Laterality: N/A;     OB History   No obstetric history on file.     Family History  Problem Relation Age of Onset   Arthritis Mother    Stroke Father    Hypertension Father    Heart disease Brother        Died during CABG   Hypertension Sister    Dementia Sister    Aneurysm Sister        Brain   Parkinsonism Sister    Kidney disease Maternal Grandmother    Colon cancer Neg Hx    Breast cancer Neg Hx     Social History   Tobacco Use   Smoking status: Never   Smokeless tobacco: Never   Tobacco comments:    a small amount when 87 yo  Vaping Use   Vaping Use: Never used  Substance Use Topics   Alcohol use: No     Alcohol/week: 0.0 standard drinks   Drug use: No    Home Medications Prior to Admission medications   Medication Sig Start Date End Date Taking? Authorizing Provider  HYDROcodone-acetaminophen (NORCO/VICODIN) 5-325 MG tablet Take 1 tablet by mouth every 6 (six) hours as needed for severe pain. 01/31/21  Yes Ripley Fraise, MD  Alirocumab (PRALUENT) 75 MG/ML SOAJ Inject 75 mg into the skin every 14 (fourteen) days. 11/18/20   Lorretta Harp, MD  Baclofen 5 MG TABS TAKE 1 TABLET (5 MG) BY MOUTH DAILY AS NEEDED (FOR MUSCLE SPASMS). 09/08/20   Tonia Ghent, MD  busPIRone (BUSPAR) 10 MG tablet as needed.    [provider]  Cholecalciferol (VITAMIN D) 50 MCG (2000 UT) CAPS Take  2 capsules (4,000 Units total) by mouth daily. 06/10/20   Tonia Ghent, MD  Coenzyme Q10 (CO Q 10 PO) Take 1 tablet by mouth daily.    [provider]  diclofenac sodium (VOLTAREN) 1 % GEL Apply 2 g topically 4 (four) times daily as needed (for joint aches). 07/23/17   Tonia Ghent, MD  DULoxetine (CYMBALTA) 30 MG capsule Take 90 mg by mouth daily.     [provider]  folic acid (FOLVITE) 767 MCG tablet Take 800 mcg by mouth daily.     [provider]  gabapentin (NEURONTIN) 300 MG capsule Take 1-2 capsules (300-600 mg total) by mouth See admin instructions. 300 mg at lunch and supper and 600 mg in the evening 06/10/20   Tonia Ghent, MD  KRILL OIL PO Take 100 mg by mouth daily.    [provider]  lamoTRIgine (LAMICTAL) 100 MG tablet Take 1 tablet (100 mg total) by mouth daily after supper. 05/20/19   Tonia Ghent, MD  lisinopril (ZESTRIL) 5 MG tablet Take 1 tablet (5 mg total) by mouth daily. 09/30/20   Lorretta Harp, MD  metoprolol succinate (TOPROL-XL) 25 MG 24 hr tablet TAKE 1 TABLET BY MOUTH  DAILY 09/30/20   Lorretta Harp, MD  OLANZapine (ZYPREXA) 2.5 MG tablet Take 1 tablet (2.5 mg total) by mouth at bedtime. 08/05/15   Tonia Ghent, MD     Allergies    Digoxin, Nitrofurantoin, Alendronate sodium, Cephalosporins, Chlordiazepoxide hcl, Phenobarbital-belladonna alk, Ritalin [methylphenidate hcl], Statins, Chlordiazepoxide, Clavulanic acid, Omeprazole, Oxcarbazepine, Sulfa antibiotics, Tetracycline, and Zetia [ezetimibe]  Review of Systems   Review of Systems  Constitutional:  Negative for fever.  Respiratory:  Negative for shortness of breath.   Cardiovascular:  Negative for chest pain.  Gastrointestinal:  Positive for abdominal pain.  Genitourinary:  Positive for flank pain.  All other systems reviewed and are negative.  Physical Exam Updated Vital Signs BP (!) 152/79   Pulse 72   Temp 98 F (36.7 C) (Oral)   Resp 11   Ht 1.549 m ($Remove'5\' 1"'DvAfBSA$ )   Wt 84.4 kg   SpO2 90%   BMI 35.16 kg/m   Physical Exam CONSTITUTIONAL: Elderly, no acute distress HEAD: Normocephalic/atraumatic EYES: EOMI/PERRL ENMT: Mucous membranes moist NECK: supple no meningeal signs SPINE/BACK:entire spine nontender  CV: S1/S2 noted, no murmurs/rubs/gallops noted LUNGS: Lungs are clear to auscultation bilaterally, no apparent distress ABDOMEN: soft, nontender, no rebound or guarding GU:no cva tenderness, no rash noted to flank NEURO: Awake/alert,  equal motor 5/5 strength noted with the following: hip flexion/knee flexion/extension, foot dorsi/plantar flexion, great toe extension intact bilaterally, no sensory deficit EXTREMITIES: pulses normal, full ROM SKIN: warm, color normal PSYCH: no abnormalities of mood noted, alert and oriented to situation  ED Results / Procedures / Treatments   Labs (all labs ordered are listed, but only abnormal results are displayed) Labs Reviewed  CBC WITH DIFFERENTIAL/PLATELET - Abnormal; Notable for the following components:      Result Value   WBC 11.8 (*)    Hemoglobin 15.3 (*)    HCT 46.7 (*)    Neutro Abs 9.8 (*)    Abs Immature Granulocytes 0.18 (*)    All other components within normal limits   COMPREHENSIVE METABOLIC PANEL - Abnormal; Notable for the following components:   Glucose, Bld 104 (*)    GFR, Estimated 56 (*)    All other components within normal limits  URINE CULTURE  URINALYSIS, ROUTINE W  REFLEX MICROSCOPIC  LIPASE, BLOOD    EKG EKG Interpretation  Date/Time:  Monday January 31 2021 05:21:40 EDT Ventricular Rate:  72 PR Interval:  156 QRS Duration: 94 QT Interval:  393 QTC Calculation: 431 R Axis:   5 Text Interpretation: Sinus rhythm Consider left atrial enlargement LVH by voltage Confirmed by Ripley Fraise [phone]) on 01/31/2021 5:32:38 AM  Radiology No results found.  Procedures Procedures   Medications Ordered in ED Medications  HYDROcodone-acetaminophen (NORCO/VICODIN) 5-325 MG per tablet 1 tablet (1 tablet Oral Given 01/31/21 0519)  ondansetron (ZOFRAN-ODT) disintegrating tablet 8 mg (8 mg Oral Given 01/31/21 5883)    ED Course  I have reviewed the triage vital signs and the nursing notes.  Pertinent labs   results that were available during my care of the patient were reviewed by me and considered in my medical decision making (see chart for details).    MDM Rules/Calculators/A&P                          7:17 AM Patient presents with right flank pain.  Apparently this is actually been intermittent for several months.  It did become worse over the past 3 days.  Tonight she woke up and did not feel well and felt like she "might die" so she called 911.  Daughter is at bedside now who helps with history.  She reports many of these  issues are chronic, but the flank pain has been worsening.  She is also been having difficulty with urinary incontinence which has been worked up by urology. Will order home health in case she is discharged to assist with back pain  Patient with mild tenderness right upper quadrant on repeat exam. Previous imaging has revealed gallstones. Although ureteral stone is still possible, as well as pyelo 7:19 AM At signout  to Dr Zenia Resides, f/u on CT renal study, lipase and urinalysis   Final Clinical Impression(s) / ED Diagnoses Final diagnoses:  None    Rx / DC Orders ED Discharge Orders          Ordered    HYDROcodone-acetaminophen (NORCO/VICODIN) 5-325 MG tablet  Every 6 hours PRN        01/31/21 0717             Ripley Fraise, MD 01/31/21 0720

## 2021-01-31 NOTE — ED Notes (Signed)
Ambulated patient. Strong, steady gait, patient did not lose balance.

## 2021-01-31 NOTE — ED Provider Notes (Signed)
Patient signed to me by Dr. Bebe Shaggy pending CT results which show no evidence of kidney stone.  Shows gallstones but no concern for infectious process.  Will discharge home   Lorre Nick, MD 01/31/21 [phone]

## 2021-02-01 LAB — URINE CULTURE

## 2021-02-01 NOTE — Progress Notes (Signed)
.  Transition of Care [facility]) - Emergency Department Mini Assessment   Patient Details  Name: Robin Juarez MRN: [id number] Date of Birth: [birth year]  [facility] CM/SW Contact:    Elliot Cousin, RN Phone Number: [phone] 02/01/2021, 4:33 PM   Clinical Narrative: TOC CM contacted pt and offered choice for [facility] She declined HH at this time. She did make follow up appt to see Surgeon. Educated her on the importance of follow up with PCP. States she will call to arrange follow up appt post ED dc. Has RW and cane but does not need to use. Husband is at home to assist with care.    ED Mini Assessment: What brought you to the Emergency Department? : pain  Barriers to Discharge: No Barriers Identified  Barrier interventions: discussed HH  Means of departure: Car  Interventions which prevented an admission or readmission: Home Health Consult or Services    Patient Contact and Communications        ,            CMS Medicare.gov Compare Post Acute Care list provided to:: Patient Choice offered to / list presented to : Patient  Admission diagnosis:  Flank Pain; Leg Numbness Patient Active Problem List   Diagnosis Date Noted   Coronary artery disease 09/21/2020   SOB (shortness of breath) 03/21/2020   Health care maintenance 05/22/2019   Vitamin D deficiency 05/22/2019   Cough 04/02/2019   Chest pain 06/14/2017   Left hip pain 12/20/2016   Skin change 03/03/2016   Leg length discrepancy 11/19/2014   Dysuria 10/25/2014   Positive cardiac stress test    Essential hypertension 08/19/2014   Atypical chest pain 08/18/2014   CVA (cerebral infarction) 08/18/2014   Balance problem 07/14/2014   Elevated blood pressure (not hypertension) 06/10/2014   Advance care planning 03/02/2014   Spinal stenosis, lumbar region, with neurogenic claudication 05/01/2013   Low back pain 04/17/2013   Paresthesia 08/21/2012   Tremor 08/21/2012   OSA (obstructive sleep  apnea) 08/07/2012   Medicare annual wellness visit, subsequent 01/12/2012   Thumb pain 08/25/2011   Weakness 04/27/2011   HYPERGLYCEMIA, MILD 09/12/2010   DISORDER OF BONE AND CARTILAGE UNSPECIFIED 05/23/2010   Anxiety state 03/01/2010   ESOPHAGEAL SPASM 03/01/2010   HIATAL HERNIA WITH REFLUX 03/01/2010   GENERALIZED OSTEOARTHROSIS UNSPECIFIED SITE 03/01/2010   RHEUMATISM UNSPECIFIED AND FIBROSITIS 03/01/2010   Myalgia 03/01/2010   OSTEOPOROSIS 03/01/2010   HLD (hyperlipidemia) 02/25/2010   MDD (major depressive disorder) 02/25/2010   GERD 02/25/2010   URINARY INCONTINENCE 02/25/2010   UTI'S, HX OF 02/25/2010   PCP:  Joaquim Nam, MD Pharmacy:   [phone] - [address], [place] - [address] [place] [address] Phone: [phone] Fax: [phone]  [facility]  [facility]) - [place], [place] - [address] [place] [address] [place], [address] [place], [address] [place] [address] Phone: [phone] Fax: [phone]

## 2021-02-03 ENCOUNTER — Other Ambulatory Visit: Payer: Self-pay | Admitting: Pharmacist Clinician (PhC)/ Clinical Pharmacy Specialist

## 2021-02-03 MED ORDER — PRALUENT 75 MG/ML ~~LOC~~ SOAJ: 75.0000 mg | SUBCUTANEOUS | 3 refills | Status: DC

## 2021-02-03 NOTE — Telephone Encounter (Signed)
Patient called - stated was giving injection in her thigh, but flinched and the medication did not go under her skin.  Will replace pen for her.

## 2021-02-04 ENCOUNTER — Telehealth: Payer: Self-pay | Admitting: *Deleted

## 2021-02-04 NOTE — Telephone Encounter (Signed)
PLEASE NOTE: All timestamps contained within this report are represented as [place] Standard Time. CONFIDENTIALTY NOTICE: This fax transmission is intended only for the addressee. It contains information that is legally privileged, confidential or otherwise protected from use or disclosure. If you are not the intended recipient, you are strictly prohibited from reviewing, disclosing, copying using or disseminating any of this information or taking any action in reliance on or regarding this information. If you have received this fax in error, please notify us immediately by telephone so that we can arrange for its return to [place]. Phone: [phone], Toll-Free: [phone], Fax: [phone] Page: 1 of 2 Call Id: [id number] [facility] Day - Client TELEPHONE ADVICE RECORD AccessNurse Patient Name: Robin Juarez [facility] Gender: Female DOB: [birth year] Age: 87 Y 7 M 18 D Return Phone Number: [phone] (Primary) Address: City/ State/ Zip: [place] [address] Client [facility] - Client Client Site [facility] [employer] - Day Physician Raechel Ache - MD Contact Type Call Who Is Calling Patient / Member / Family / Caregiver Call Type Triage / Clinical Caller Name Tychelle Purkey Relationship To Patient Spouse Return Phone Number [phone] (Primary) Chief Complaint Flank Pain Reason for Call Symptomatic / Request for Health Information Initial Comment Caller states his wife has Sx of extreme abdomina/ flank pain, from stomach into back. GOTO Facility Not Listed Clinic at [facility] Translation No Nurse Assessment Nurse: Clarita Leber, RN, Gavin Pound Date/Time (Eastern Time): 02/04/2021 10:01:25 AM Confirm and document reason for call. If symptomatic, describe symptoms. ---Caller states that his wife is having pain that starts from the back around to the front lower right side. This happened a few years ago. Had steroids. She  mopped the house the day before. Couldn't get in to see the MD. He states that he took her to 3 UCs and they could not see her. Finally saw a PA somewhere who put her on steroids and Flexeril. This has been going on for about a week. Then he thought she was having a stroke and called 911 and it wasn't a stroke. He states that scanned her and could not find any problems. One of her doctors put her on a pill for the overactive bladder. He states that they have been married for 69 years. Last MD that she saw on Monday. And was told that it may be a muscle but didn't find out what was wrong. Finished the steroids. Feels weak. Does the patient have any new or worsening symptoms? ---Yes Will a triage be completed? ---Yes Related visit to physician within the last 2 weeks? ---Yes Does the PT have any chronic conditions? (i.e. diabetes, asthma, this includes High risk factors for pregnancy, etc.) ---Yes List chronic conditions. ---hypertension, incontinence. On 2 beta blockers. Has had a heart cath and everything looked great Is this a behavioral health or substance abuse call? ---No PLEASE NOTE: All timestamps contained within this report are represented as [place] Standard Time. CONFIDENTIALTY NOTICE: This fax transmission is intended only for the addressee. It contains information that is legally privileged, confidential or otherwise protected from use or disclosure. If you are not the intended recipient, you are strictly prohibited from reviewing, disclosing, copying using or disseminating any of this information or taking any action in reliance on or regarding this information. If you have received this fax in error, please notify us immediately by telephone so that we can arrange for its return to [place]. Phone: [phone], Toll-Free: [phone], Fax: [phone]  Page: 2 of 2 Call Id: [id number] Guidelines Guideline Title Affirmed Question Affirmed Notes Nurse Date/Time  Lamount Cohen Time) Weakness (Generalized) and Fatigue [1] MODERATE weakness (i.e., interferes with work, school, normal activities) AND [2] cause unknown (Exceptions: weakness with acute minor illness, or weakness from poor fluid intake) Womble, RN, Gavin Pound 02/04/2021 10:10:38 AM Disp. Time Lamount Cohen Time) Disposition Final User 02/04/2021 10:17:07 AM See HCP within 4 Hours (or PCP triage) Yes Clarita Leber, RN, Jetty Duhamel Disagree/Comply Comply Caller Understands Yes PreDisposition Go to Urgent Care/Walk-In Clinic Care Advice Given Per Guideline SEE HCP (OR PCP TRIAGE) WITHIN 4 HOURS: Referrals GO TO FACILITY OTHER - SPECIFY

## 2021-02-04 NOTE — Telephone Encounter (Signed)
Noted. Thanks.

## 2021-02-04 NOTE — Telephone Encounter (Signed)
See other phone note

## 2021-02-04 NOTE — Telephone Encounter (Signed)
Spoke to patient and was advised that she went to the ER this week and found out that she has small gallstones. Patent stated that she is scheduled to see a surgeon in July. Patient stated that she is having pain when she moves that starts in her spine that goes around to her right side and is a pain level 5. Patient stated that she went to [facility] last week and she is going back there today between 2:00-3:00 pm today. . Patient stated that the PA there was good and she is going back there. Patient stated that the PA told her if the treatment that she previously gave her did not work she would do an x-ray. Patient stated that she had called the office to schedule an appointment with Dr. Para March next week but feels that she wants to go ahead and get checked out today at the [facility]. After speaking with Dr Para March patient was advised that he would support that. Patient was advised to call the office back Monday and schedule an appointment with Dr. Para March if she needs to follow-up with him and she verbalized understanding.

## 2021-02-07 ENCOUNTER — Telehealth: Payer: Self-pay

## 2021-02-07 NOTE — Telephone Encounter (Signed)
Pt called in to the office and stated that she believed that she figured out what has been causing her pain. She stated that she has been injecting the same spot every 2 weeks for pcsk9 therapy instead of rotating as instructed. The pt stated that she wanted help to alleviate the pain and kristin alvstad gave me verbal instruction to have the pt use cold compress and ice. Pt voiced understanding.

## 2021-02-08 ENCOUNTER — Telehealth: Payer: Self-pay | Admitting: Pharmacist

## 2021-02-08 NOTE — Telephone Encounter (Signed)
Patient called ain to check what else it can be done for her flank pain.   She is asking about taking advil, and using heat/cold compress .  Recommendation:  - Use only cold compress for 10  minutes two to three times per day. No need to use heat if not treating a muscle injury   - Okay to take norco as prescribed by ER physician or take Tylenol. Avoid taking Advil every day

## 2021-02-09 ENCOUNTER — Emergency Department (HOSPITAL_COMMUNITY)
Admission: EM | Admit: 2021-02-09 | Discharge: 2021-02-09 | Disposition: A | Discharge status: Home / Self Care | Payer: Medicare Other | Attending: Emergency Medicine | Admitting: Emergency Medicine

## 2021-02-09 ENCOUNTER — Encounter (HOSPITAL_COMMUNITY): Payer: Self-pay | Admitting: Emergency Medicine

## 2021-02-09 ENCOUNTER — Telehealth: Payer: Self-pay

## 2021-02-09 ENCOUNTER — Emergency Department (HOSPITAL_COMMUNITY): Payer: Medicare Other

## 2021-02-09 DIAGNOSIS — M545 Low back pain, unspecified: Secondary | ICD-10-CM | POA: Diagnosis present

## 2021-02-09 DIAGNOSIS — R109 Unspecified abdominal pain: Secondary | ICD-10-CM

## 2021-02-09 DIAGNOSIS — Z79899 Other long term (current) drug therapy: Secondary | ICD-10-CM | POA: Diagnosis not present

## 2021-02-09 DIAGNOSIS — I2510 Atherosclerotic heart disease of native coronary artery without angina pectoris: Secondary | ICD-10-CM | POA: Insufficient documentation

## 2021-02-09 DIAGNOSIS — R1031 Right lower quadrant pain: Secondary | ICD-10-CM | POA: Diagnosis not present

## 2021-02-09 DIAGNOSIS — I119 Hypertensive heart disease without heart failure: Secondary | ICD-10-CM | POA: Insufficient documentation

## 2021-02-09 LAB — COMPREHENSIVE METABOLIC PANEL
ALT: 20 U/L (ref 0–44)
AST: 22 U/L (ref 15–41)
Albumin: 4.2 g/dL (ref 3.5–5.0)
Alkaline Phosphatase: 89 U/L (ref 38–126)
Anion gap: 8 (ref 5–15)
BUN: 9 mg/dL (ref 8–23)
CO2: 27 mmol/L (ref 22–32)
Calcium: 9.5 mg/dL (ref 8.9–10.3)
Chloride: 103 mmol/L (ref 98–111)
Creatinine, Ser: 0.71 mg/dL (ref 0.44–1.00)
GFR, Estimated: >60 mL/min (ref >60)
Glucose, Bld: 96 mg/dL (ref 70–99)
Potassium: 4.2 mmol/L (ref 3.5–5.1)
Sodium: 138 mmol/L (ref 135–145)
Total Bilirubin: 0.8 mg/dL (ref 0.3–1.2)
Total Protein: 7.1 g/dL (ref 6.5–8.1)

## 2021-02-09 LAB — URINALYSIS, ROUTINE W REFLEX MICROSCOPIC
Bilirubin Urine: NEGATIVE
Glucose, UA: NEGATIVE mg/dL
Hgb urine dipstick: NEGATIVE
Ketones, ur: NEGATIVE mg/dL
Leukocytes,Ua: NEGATIVE
Nitrite: NEGATIVE
Protein, ur: NEGATIVE mg/dL
Specific Gravity, Urine: 1.006 (ref 1.005–1.030)
pH: 8 (ref 5.0–8.0)

## 2021-02-09 LAB — CBC
HCT: 45.2 % (ref 36.0–46.0)
Hemoglobin: 14.8 g/dL (ref 12.0–15.0)
MCH: 32.7 pg (ref 26.0–34.0)
MCHC: 32.7 g/dL (ref 30.0–36.0)
MCV: 99.8 fL (ref 80.0–100.0)
Platelets: 217 10*3/uL (ref 150–400)
RBC: 4.53 MIL/uL (ref 3.87–5.11)
RDW: 13.1 % (ref 11.5–15.5)
WBC: 11.1 10*3/uL — ABNORMAL HIGH (ref 4.0–10.5)
nRBC: 0 % (ref 0.0–0.2)

## 2021-02-09 LAB — LIPASE, BLOOD: Lipase: 24 U/L (ref 11–51)

## 2021-02-09 MED ORDER — LIDOCAINE 5 % EX PTCH: 1.0000 | MEDICATED_PATCH | CUTANEOUS | 0 refills | Status: DC

## 2021-02-09 MED ORDER — HYDROCODONE-ACETAMINOPHEN 5-325 MG PO TABS
1.0000 | ORAL_TABLET | Freq: Once | ORAL | Status: AC
  Administered 2021-02-09: 1 via ORAL
  Filled 2021-02-09: qty 1

## 2021-02-09 NOTE — ED Provider Notes (Signed)
[facility] LONG EMERGENCY DEPARTMENT Provider Note  CSN: [id number] Arrival date & time: 02/09/21 1215    History Chief Complaint  Patient presents with   Back Pain     Back Pain  Robin Juarez is a 87 y.o. female has had several weeks of R flank pain, persistent despite several UC and ED visits for same. She was seen in the ED 01/31/21 with workup including labs and CT did not show a cause of her pain but she was noted to have several small non-obstructing gall stones. She has been taking APAP, Motrin, Baclofen and Norco intermittently for pain with some improvement. She woke up this morning and pain had returned. She denies fall to me but triage note mentions a fall. She did not take any of her pain medications today because she 'didn't want to mask it' and called EMS to bring her back to the ED. Denies any change in her symptoms from usual. No fever, vomiting, abdominal pain, dysuria, hematuria. No bowel issues. No CP, SOB. She is scheduled to see her PCP in 2 days.    Past Medical History:  Diagnosis Date   Arthritis    knee and hip OA- prev injection by Dr. Ethelene Hal   Blood transfusion without reported diagnosis    37 yrs ago -s/p back surgery   Chest pain    Coronary artery disease    50% mid LAD by cath January 2016   Depression 1993, 2000   Fibromyalgia    GERD (gastroesophageal reflux disease)    H/O hiatal hernia    Hyperlipidemia    Hypertension    OSA (obstructive sleep apnea)    off tx as of 2020, no sx as of 2020   Osteoporosis    Positive cardiac stress test    Stroke [facility])    Urinary incontinence    UTI (lower urinary tract infection)     Past Surgical History:  Procedure Laterality Date   ABDOMINAL HYSTERECTOMY  1976   APPENDECTOMY  1964   Back fusions  1977   missing vertebra   BACK SURGERY     fusions lumbar area   BREAST BIOPSY  1995   x 2   BREAST BIOPSY  07/10/2012   Procedure: BREAST BIOPSY WITH NEEDLE LOCALIZATION;  Surgeon: Emelia Loron, MD;  Location: [facility];  Service: General;  Laterality: Right;   CARDIOVASCULAR STRESS TEST  2009   Normal, Dr. Mayford Knife   CATARACT EXTRACTION, BILATERAL Bilateral 2005   Childbirth  1954,1956,1957,1960   CYSTOSCOPY N/A 11/25/2012   Procedure: CYSTOSCOPY;  Surgeon: Martina Sinner, MD;  Location: [facility];  Service: Urology;  Laterality: N/A;   dilatation and currettage  1964   LEFT HEART CATH AND CORONARY ANGIOGRAPHY N/A 06/15/2017   Procedure: LEFT HEART CATH AND CORONARY ANGIOGRAPHY;  Surgeon: Lennette Bihari, MD;  Location: [facility] INVASIVE CV LAB;  Service: Cardiovascular;  Laterality: N/A;   LEFT HEART CATHETERIZATION WITH CORONARY ANGIOGRAM N/A 09/03/2014   Procedure: LEFT HEART CATHETERIZATION WITH CORONARY ANGIOGRAM;  Surgeon: Runell Gess, MD;  Location: [facility] CATH LAB;  Service: Cardiovascular;  Laterality: N/A;   LUMBAR LAMINECTOMY/DECOMPRESSION MICRODISCECTOMY N/A 05/01/2013   Procedure: COMPLETE DECOMPRESSIVE LUMBAR LAMINECTOMY L3-L4 MICRODISCECTOMY L3-L4 ON THE LEFT;  Surgeon: Jacki Cones, MD;  Location: [facility];  Service: Orthopedics;  Laterality: N/A;   OVARIAN CYST REMOVAL  1964   PUBOVAGINAL SLING N/A 11/25/2012   Procedure: Leonides Grills;  Surgeon: Martina Sinner, MD;  Location: [place];  Service: Urology;  Laterality: N/A;    Family History  Problem Relation Age of Onset   Arthritis Mother    Stroke Father    Hypertension Father    Heart disease Brother        Died during CABG   Hypertension Sister    Dementia Sister    Aneurysm Sister        Brain   Parkinsonism Sister    Kidney disease Maternal Grandmother    Colon cancer Neg Hx    Breast cancer Neg Hx     Social History   Tobacco Use   Smoking status: Never   Smokeless tobacco: Never   Tobacco comments:    a small amount when 87 yo  Vaping Use   Vaping Use: Never used  Substance Use Topics   Alcohol use: No    Alcohol/week: 0.0  standard drinks   Drug use: No     Home Medications Prior to Admission medications   Medication Sig Start Date End Date Taking? Authorizing Provider  lidocaine (LIDODERM) 5 % Place 1 patch onto the skin daily. Remove & Discard patch within 12 hours or as directed by MD 02/09/21  Yes Truddie Hidden, MD  Alirocumab (PRALUENT) 75 MG/ML SOAJ Inject 75 mg into the skin every 14 (fourteen) days. 02/03/21   Lorretta Harp, MD  Baclofen 5 MG TABS TAKE 1 TABLET (5 MG) BY MOUTH DAILY AS NEEDED (FOR MUSCLE SPASMS). 09/08/20   Tonia Ghent, MD  busPIRone (BUSPAR) 10 MG tablet as needed.    [provider]  Cholecalciferol (VITAMIN D) 50 MCG (2000 UT) CAPS Take 2 capsules (4,000 Units total) by mouth daily. 06/10/20   Tonia Ghent, MD  Coenzyme Q10 (CO Q 10 PO) Take 1 tablet by mouth daily.    [provider]  diclofenac sodium (VOLTAREN) 1 % GEL Apply 2 g topically 4 (four) times daily as needed (for joint aches). 07/23/17   Tonia Ghent, MD  DULoxetine (CYMBALTA) 30 MG capsule Take 90 mg by mouth daily.     [provider]  folic acid (FOLVITE) 263 MCG tablet Take 800 mcg by mouth daily.     [provider]  gabapentin (NEURONTIN) 300 MG capsule Take 1-2 capsules (300-600 mg total) by mouth See admin instructions. 300 mg at lunch and supper and 600 mg in the evening 06/10/20   Tonia Ghent, MD  HYDROcodone-acetaminophen (NORCO/VICODIN) 5-325 MG tablet Take 1 tablet by mouth every 6 (six) hours as needed for severe pain. 01/31/21   Ripley Fraise, MD  KRILL OIL PO Take 100 mg by mouth daily.    [provider]  lamoTRIgine (LAMICTAL) 100 MG tablet Take 1 tablet (100 mg total) by mouth daily after supper. 05/20/19   Tonia Ghent, MD  lisinopril (ZESTRIL) 5 MG tablet Take 1 tablet (5 mg total) by mouth daily. 09/30/20   Lorretta Harp, MD  metoprolol succinate (TOPROL-XL) 25 MG 24 hr tablet TAKE 1 TABLET BY MOUTH  DAILY 09/30/20   Lorretta Harp, MD  OLANZapine (ZYPREXA) 2.5 MG tablet Take 1 tablet (2.5 mg total) by mouth at bedtime. 08/05/15   Tonia Ghent, MD     Allergies    Digoxin, Nitrofurantoin, Alendronate sodium, Cephalosporins, Chlordiazepoxide hcl, Phenobarbital-belladonna alk, Ritalin [methylphenidate hcl], Statins, Chlordiazepoxide, Clavulanic acid, Omeprazole, Oxcarbazepine, Sulfa antibiotics, Tetracycline, and Zetia [ezetimibe]   Review of Systems   Review of Systems  Musculoskeletal:  Positive for back pain.  A comprehensive review of systems was completed and negative except as noted in HPI.    Physical Exam BP (!) 158/62 (BP Location: Left Arm)   Pulse 85   Temp 98.1 F (36.7 C) (Oral)   Resp 18   SpO2 96%   Physical Exam Vitals and nursing note reviewed.  Constitutional:      Appearance: Normal appearance.  HENT:     Head: Normocephalic and atraumatic.     Nose: Nose normal.     Mouth/Throat:     Mouth: Mucous membranes are moist.  Eyes:     Extraocular Movements: Extraocular movements intact.     Conjunctiva/sclera: Conjunctivae normal.  Cardiovascular:     Rate and Rhythm: Normal rate.  Pulmonary:     Effort: Pulmonary effort is normal.     Breath sounds: Normal breath sounds.  Abdominal:     General: Abdomen is flat.     Palpations: Abdomen is soft.     Tenderness: There is no abdominal tenderness.  Musculoskeletal:        General: Tenderness (mild tenderness R lumbar and thoracic paraspinal muscles) present. No swelling. Normal range of motion.     Cervical back: Neck supple.  Skin:    General: Skin is warm and dry.     Findings: No rash.  Neurological:     General: No focal deficit present.     Mental Status: She is alert.     Sensory: No sensory deficit.     Motor: No weakness.  Psychiatric:        Mood and Affect: Mood normal.     ED Results / Procedures / Treatments   Labs (all labs ordered are listed, but only abnormal results are displayed) Labs  Reviewed  URINALYSIS, ROUTINE W REFLEX MICROSCOPIC - Abnormal; Notable for the following components:      Result Value   Color, Urine STRAW (*)    All other components within normal limits  CBC - Abnormal; Notable for the following components:   WBC 11.1 (*)    All other components within normal limits  COMPREHENSIVE METABOLIC PANEL  LIPASE, BLOOD    EKG None  Radiology US Abdomen Limited RUQ (LIVER/GB)  Result Date: 02/09/2021 CLINICAL DATA:  Right upper quadrant abdominal pain EXAM: ULTRASOUND ABDOMEN LIMITED RIGHT UPPER QUADRANT COMPARISON:  CT scan 01/31/2021 FINDINGS: Gallbladder: Multiple gallstones are demonstrated. The largest measures 7 mm. No gallbladder wall thickening, pericholecystic fluid or sonographic Murphy sign to suggest acute cholecystitis. Common bile duct: Diameter: 5.8 mm Liver: Normal echogenicity without focal lesion or biliary dilatation. Portal vein is patent on color Doppler imaging with normal direction of blood flow towards the liver. Other: None. IMPRESSION: 1. Cholelithiasis without sonographic findings for acute cholecystitis. 2. Normal caliber common bile duct. 3. Normal sonographic appearance of the liver. Electronically Signed   By: Marijo Sanes M.D.   On: 02/09/2021 14:15    Procedures Procedures  Medications Ordered in the ED Medications  HYDROcodone-acetaminophen (NORCO/VICODIN) 5-325 MG per tablet 1 tablet (has no administration in time range)     MDM Rules/Calculators/A&P MDM ED workup today done in triage is unremarkable. No signs of biliary disease that would be causing her pain. No other emergent medical condition identified. Advised to continue taking her prescribed pain medications and follow up with her PCP as scheduled for further management. Rx lidoderm patches as an adjunct.   ED Course  I have reviewed the triage vital signs  and the nursing notes.  Pertinent labs & imaging results that were available during my care of the patient  were reviewed by me and considered in my medical decision making (see chart for details).     Final Clinical Impression(s) / ED Diagnoses Final diagnoses:  Abdominal pain  Acute right-sided low back pain without sciatica    Rx / DC Orders ED Discharge Orders          Ordered    lidocaine (LIDODERM) 5 %  Every 24 hours        02/09/21 1542             Truddie Hidden, MD 02/09/21 [phone]

## 2021-02-09 NOTE — Telephone Encounter (Signed)
Noted. Thanks.

## 2021-02-09 NOTE — Telephone Encounter (Signed)
[facility] - Client TELEPHONE ADVICE RECORD AccessNurse Patient Name: Robin Juarez [facility] Gender: Female DOB: 1931-07-10 Age: 87 Y 7 M 23 D Return Phone Number: [phone] (Primary) Address: City/ State/ Zip: [place] [address] Client [facility] - Client Client Site [facility] - Day Physician Raechel Ache - MD Contact Type Call Who Is Calling Patient / Member / Family / Caregiver Call Type Triage / Clinical Relationship To Patient Self Return Phone Number [phone] (Primary) Chief Complaint ABDOMINAL PAIN - Severe and only in abdomen Reason for Call Symptomatic / Request for Health Information Initial Comment Caller states she has sharp pain in side, urgent care/er didn't help. Has appointment Friday. GOTO Facility Not Listed [facility], ED Translation No Nurse Assessment Nurse: Elesa Hacker, RN, Nash Dimmer Date/Time Lamount Cohen Time): 02/09/2021 10:09:32 AM Confirm and document reason for call. If symptomatic, describe symptoms. ---Husband advised that wife is having pain in right side x a week. Advised that she was given medication on Monday. Scan at the ER a week ago. Pain has stayed with her. Had a reaction to the flexeril. Advised that she also had a steroid dose pack and has finished. Wife has had X rays. Caller advised that she is having soreness in her right side. Pain in right side. Can not hardly walk related to the pain. Pain at this time is 10/10 when moving. No temp. Does the patient have any new or worsening symptoms? ---Yes Will a triage be completed? ---Yes Related visit to physician within the last 2 weeks? ---Yes Does the PT have any chronic conditions? (i.e. diabetes, asthma, this includes High risk factors for pregnancy, etc.) ---Yes List chronic conditions. ---HTN Is this a behavioral health or substance abuse call? ---No Guidelines Guideline Title Affirmed Question  Affirmed Notes Nurse Date/Time Lamount Cohen Time) Flank Pain [1] SEVERE pain (e.g., excruciating, Deaton, RN, Nash Dimmer 02/09/2021 10:17:23 AM PLEASE NOTE: All timestamps contained within this report are represented as [place] Standard Time. CONFIDENTIALTY NOTICE: This fax transmission is intended only for the addressee. It contains information that is legally privileged, confidential or otherwise protected from use or disclosure. If you are not the intended recipient, you are strictly prohibited from reviewing, disclosing, copying using or disseminating any of this information or taking any action in reliance on or regarding this information. If you have received this fax in error, please notify us immediately by telephone so that we can arrange for its return to [place]. Phone: [phone], Toll-Free: [phone], Fax: [phone] Page: 2 of 2 Call Id: [id number] Guidelines Guideline Title Affirmed Question Affirmed Notes Nurse Date/Time Lamount Cohen Time) scale 8-10) AND [2] present > 1 hour Disp. Time Lamount Cohen Time) Disposition Final User 02/09/2021 10:06:53 AM Send to Urgent Juel Burrow 02/09/2021 10:22:37 AM Go to ED Now Yes Deaton, RN, Cory Roughen Disagree/Comply Disagree Caller Understands Yes PreDisposition Did not know what to do Care Advice Given Per Guideline GO TO ED NOW: * You need to be seen in the Emergency Department. * Go to the ED at ___________ Hospital. * Leave now. Drive carefully. CARE ADVICE given per Flank Pain (Adult) guideline. ANOTHER ADULT SHOULD DRIVE: * It is better and safer if another adult drives instead of you. Comments User: Wandra Scot, RN Date/Time Lamount Cohen Time): 02/09/2021 10:24:45 AM Caller advised that she was planning on going to the ED advised that they may call the ambulance. User: Wandra Scot, RN Date/Time Lamount Cohen Time): 02/09/2021 10:25:14 AM Tried to notify backline,  no answer. Referrals GO TO FACILITY OTHER - SPECIFY

## 2021-02-09 NOTE — Telephone Encounter (Signed)
Noted  

## 2021-02-09 NOTE — Telephone Encounter (Signed)
I spoke with pts husband and pt is on her way by ambulance to [facility] ED now. Mr Fetters will cb later this week with update. Sending note to Dr Para March who is out of office and Dr Sharen Hones who is in office.

## 2021-02-09 NOTE — ED Triage Notes (Addendum)
Per EMS-states right sided back pain for weeks-unable to get in to see PCP for follow up for another month-has been seen in ED and with PCP several times-diagnosed with gallstones-no dysuria, no N/V/D

## 2021-02-09 NOTE — ED Provider Notes (Signed)
Emergency Medicine Provider Triage Evaluation Note  Robin Juarez 87 y.o. female was evaluated in triage.  Pt complains of back pain, right upper quadrant pain.  She reports is been ongoing for a month.  She states that it intermittently will occur and then get better.  She fell yesterday, was getting slightly better but when she woke up today, the pain is worse.  She has not had any associated nausea, vomiting.  She denies any fevers, urinary complaints.  No chest pain, difficulty breathing.   Review of Systems  Positive: Back pain, right upper quadrant abdominal pain. Negative: Fevers, chest pain, difficulty breathing, nausea/vomiting.  Physical Exam  BP 134/82   Pulse 70   Temp 98.2 F (36.8 C) (Oral)   Resp 18   Ht 5\' 4"  (1.626 m)   Wt 65.8 kg   SpO2 100%   BMI 24.89 kg/m  Gen:   Awake, no distress   HEENT:  Atraumatic  Resp:  Normal effort  Cardiac:  Normal rate  Abd:   Nondistended, tenderness palpation of the right upper quadrant. MSK:   Moves extremities without difficulty  Neuro:  Speech clear   Other:      Medical Decision Making  Medically screening exam initiated at 12:37 PM  Appropriate orders placed.  Robin Juarez was informed that the remainder of the evaluation will be completed by another provider, this initial triage assessment does not replace that evaluation. They are counseled that they will need to remain in the ED until the completion of their workup, including full H&P and results of any tests.  Risks of leaving the emergency department prior to completion of treatment were discussed. Patient was advised to inform ED staff if they are leaving before their treatment is complete. The patient acknowledged these risks and time was allowed for questions.     The patient appears stable so that the remainder of the MSE may be completed by another provider.    Clinical Impression  Right upper quadrant pain   Portions of this note were generated with  Dragon dictation software. Dictation errors may occur despite best attempts at proofreading.     [name], PA-C 02/09/21 1238    02/11/21, MD 02/09/21 [phone]

## 2021-02-11 ENCOUNTER — Telehealth: Payer: Self-pay | Admitting: Family Medicine

## 2021-02-11 ENCOUNTER — Ambulatory Visit: Payer: Medicare Other | Admitting: Family Medicine

## 2021-02-11 ENCOUNTER — Encounter: Payer: Self-pay | Admitting: Family Medicine

## 2021-02-11 ENCOUNTER — Inpatient Hospital Stay: Payer: Medicare Other | Admitting: Family Medicine

## 2021-02-11 ENCOUNTER — Other Ambulatory Visit: Payer: Self-pay

## 2021-02-11 VITALS — BP 124/72 | HR 78 | Temp 98.1°F | Ht 61.0 in | Wt 186.7 lb

## 2021-02-11 DIAGNOSIS — K8020 Calculus of gallbladder without cholecystitis without obstruction: Secondary | ICD-10-CM

## 2021-02-11 NOTE — Progress Notes (Signed)
This visit occurred during the SARS-CoV-2 public health emergency.  Safety protocols were in place, including screening questions prior to the visit, additional usage of staff PPE, and extensive cleaning of exam room while observing appropriate contact time as indicated for disinfecting solutions.  Only taking baclofen prn.  Taking that daily seems to help.  She had initial eval at UC, given rx for prednisone and muscle relaxer.  Then she woke up at night with leg numbness, she was worried about possible CVA and that leg to ER eval.  She was discharge home with pain medicine for R sided flank pain. Unclear if the leg numbness was related taking muscle relaxer relatively close together, ie 3 doses in 12 hours not 3 doses in 24 hours.    Imaging on 01/31/21 with  1. Cholelithiasis. Gallbladder wall does not appear appreciably thickened by CT.  2. No renal or ureteral calculi. No hydronephrosis on either side. Urinary bladder wall thickness within normal limits.  3. Sigmoid diverticula without diverticulitis. No bowel wall thickening or bowel obstruction. No abscess in the abdomen or pelvis. Appendix absent.  4. Aortic Atherosclerosis (ICD10-I70.0).  5. Stable spondylolisthesis of L5 on S1. 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  6. Uterus absent.   ======================  Return of same pain led to ER eval on 02/09/21.    U/s done 02/09/21 with  IMPRESSION: 1. Cholelithiasis without sonographic findings for acute cholecystitis. 2. Normal caliber common bile duct. 3. Normal sonographic appearance of the liver.  ======================   R sided pain is better but still sore locally.  It is noted that her pain started the day after she had been mopping the house. Patient mentioned eval at [facility], with Robin So, PA.  We talked about options.  Meds, vitals, and allergies reviewed.   ROS: Per HPI unless specifically indicated in ROS section    GEN: nad, alert and oriented HEENT: mucous membranes moist NECK: supple w/o LA CV: rrr. PULM: ctab, no inc wob ABD: soft, +bs,  Ttp w/o rash on the R midaxillary line along the ribs but RUQ isn't ttp.   EXT: no edema SKIN: no acute rash

## 2021-02-11 NOTE — Telephone Encounter (Signed)
Patient brought this up on her own at the visit.  See OV note.

## 2021-02-11 NOTE — Telephone Encounter (Signed)
Mrs. Robin Juarez called in wanted to discuss something . The entire family wants her to go to [facility] due to they were rude at the walk in clinic and the hospitals in [place]. But the husband doesn't want her to go. And she has doctor picked out and wanted to know about getting a recommendations.

## 2021-02-11 NOTE — Telephone Encounter (Signed)
And when they come to the appointment the daughter doesn't want it brought up to them.

## 2021-02-11 NOTE — Patient Instructions (Signed)
Take baclofen if needed.  Let me see about getting the gallbladder test set up.  We can always refer to [facility] at any point if needed.  Take care.  Glad to see you.

## 2021-02-13 DIAGNOSIS — K8020 Calculus of gallbladder without cholecystitis without obstruction: Secondary | ICD-10-CM | POA: Insufficient documentation

## 2021-02-13 NOTE — Assessment & Plan Note (Signed)
We talked about options.  This could be musculoskeletal, since baclofen seems to help.  It started after she had been mopping the house.  However she has had recurrent severe pain and she does have gallstones noted.  We talked about getting about a biliary scan set up before we did anything else.  We will work on getting that arranged.  In the meantime use baclofen daily as needed.  We talked about [facility] referral but it likely makes sense to defer that at least until we can get the hepatobiliary scan done.  Rationale for that test discussed with patient and she agreed with plan.  Anatomy discussed.  Okay for outpatient follow-up.  40 minutes were devoted to patient care in this encounter (this includes time spent reviewing the patient's file/history, interviewing and examining the patient, counseling/reviewing plan with patient).

## 2021-02-15 ENCOUNTER — Telehealth: Payer: Self-pay | Admitting: Family Medicine

## 2021-02-15 NOTE — Telephone Encounter (Signed)
Authorization was taking care of earlier this morning. I spoke with pre cert department and re scheduled patient for the appointment she had. I notified schedulers and pre cert that this was cancelled in error and not sure what happened on their end. I called patient and her husband back and let them know that everything was taking care of and she was back on the schedule. I apologized for the confusion and stress this caused the patient.

## 2021-02-15 NOTE — Telephone Encounter (Signed)
Pt's husband called stating Robin Juarez had an appointment tomorrow, but they received a call stating the appointment was cancelled due to not having approval from insurance Pt's husband is wanting a call back ASAP to discuss this and get it fixed.

## 2021-02-15 NOTE — Addendum Note (Signed)
Addended by: Consuella Lose on: 02/15/2021 09:30 AM   Modules accepted: Orders

## 2021-02-16 ENCOUNTER — Other Ambulatory Visit: Payer: Self-pay

## 2021-02-16 ENCOUNTER — Ambulatory Visit (HOSPITAL_COMMUNITY)
Admission: RE | Admit: 2021-02-16 | Discharge: 2021-02-16 | Disposition: A | Discharge status: Home / Self Care | Payer: Medicare Other | Source: Ambulatory Visit | Attending: Family Medicine | Admitting: Family Medicine

## 2021-02-16 ENCOUNTER — Encounter (HOSPITAL_COMMUNITY): Payer: Medicare Other

## 2021-02-16 DIAGNOSIS — K8020 Calculus of gallbladder without cholecystitis without obstruction: Secondary | ICD-10-CM | POA: Insufficient documentation

## 2021-02-16 MED ORDER — TECHNETIUM TC 99M MEBROFENIN IV KIT
5.4000 | PACK | Freq: Once | INTRAVENOUS | Status: AC
  Administered 2021-02-16: 5.4 via INTRAVENOUS

## 2021-02-17 NOTE — Telephone Encounter (Signed)
error 

## 2021-02-18 ENCOUNTER — Other Ambulatory Visit: Payer: Self-pay | Admitting: Family Medicine

## 2021-02-18 ENCOUNTER — Telehealth: Payer: Self-pay | Admitting: Family Medicine

## 2021-02-18 MED ORDER — BACLOFEN 5 MG PO TABS: ORAL_TABLET | ORAL | Status: DC

## 2021-02-18 NOTE — Telephone Encounter (Signed)
Mr. Sadik called in wanted to know about the results his wife had done at the hospital this week, he stated that when he called the hospital they told him that  Dr. Para March should have it due to he under [facility]

## 2021-02-18 NOTE — Telephone Encounter (Signed)
Spoke with patient about results. See result note.  °

## 2021-02-22 ENCOUNTER — Other Ambulatory Visit (HOSPITAL_COMMUNITY): Payer: Medicare Other

## 2021-04-26 ENCOUNTER — Telehealth: Payer: Self-pay

## 2021-04-26 NOTE — Telephone Encounter (Signed)
Returned a call to the pt as they had lmom [place] requesting a call back but they didn't state the reason for the call. I was unable to leave a msg and just heard a constant ringing on the home phone so I then tried to call the mobile phone and got her husband to answer he stated that they they were worried that the pa wsa expiring. I reassured them that I will take care of it and not to worry and they voiced understanding.

## 2021-05-30 ENCOUNTER — Telehealth: Payer: Self-pay

## 2021-05-30 DIAGNOSIS — E782 Mixed hyperlipidemia: Secondary | ICD-10-CM

## 2021-05-30 NOTE — Telephone Encounter (Signed)
Pt called in stated she wanted to talk to a pharmd regarding aches and pains with praluent. Wants to know if there are other options. Routing to pharmd pool

## 2021-05-31 NOTE — Telephone Encounter (Signed)
Patient called.  Reports she has been dealing with increasing muscle pain over the last few months and did not know if it was due to Praluent.  Advised that we have had a few patients reports of muscle pain on PCSK9i but unclear if that was the cause.  Recommended she hold next weeks injection to see if symptoms subside.  Also, patient due for lipid panel.  Placed order and patient aware.

## 2021-06-14 ENCOUNTER — Telehealth: Payer: Self-pay | Admitting: Pharmacist

## 2021-06-14 NOTE — Telephone Encounter (Signed)
Patient called.  Skipped last dose of Praluent and reports no longer has muscle pain.  Plans to discontinue for now.

## 2021-06-15 LAB — LIPID PANEL
Chol/HDL Ratio: 2.8 ratio (ref 0.0–4.4)
Cholesterol, Total: 170 mg/dL (ref 100–199)
HDL: 61 mg/dL (ref >39)
LDL Chol Calc (NIH): 88 mg/dL (ref 0–99)
Triglycerides: 118 mg/dL (ref 0–149)
VLDL Cholesterol Cal: 21 mg/dL (ref 5–40)

## 2021-06-20 ENCOUNTER — Telehealth: Payer: Self-pay | Admitting: Cardiovascular Disease

## 2021-06-20 NOTE — Telephone Encounter (Signed)
   Pt c/o medication issue:  1. Name of Medication: lisinopril (ZESTRIL) 5 MG tablet  2. How are you currently taking this medication (dosage and times per day)? Take 1 tablet (5 mg total) by mouth daily.  3. Are you having a reaction (difficulty breathing--STAT)?   4. What is your medication issue? Pt said she spoke with her pcp and was advised to call Dr. Allyson Sabal because she might having a reaction to lisinopril, she said she gets her lip swell and this morning she was having wheezing with a noise and couldn't go back to sleep because of the noise.

## 2021-06-20 NOTE — Telephone Encounter (Signed)
Pt called reporting she was advised by her pcp to call our office due to having a reaction to lisinopril. She state she woke up this morning with swollen lips and wheezing. She has since took benadryl and symptoms resolved. Nurse advised to hold lisinopril in the morning and will await Dr. Hazle Coca recommendations. Pt verbalized understanding.

## 2021-06-21 MED ORDER — AMLODIPINE BESYLATE 2.5 MG PO TABS: 2.5000 mg | ORAL_TABLET | Freq: Every day | ORAL | 3 refills | Status: DC

## 2021-06-21 NOTE — Telephone Encounter (Signed)
Pt updated with MD's recommendations and verbalized understanding. New orders placed.  Runell Gess, MD  You 44 minutes ago (7:20 AM)   DC lisinopril and add as allergy in Epic. Start amlodipine 2.5 mg and have her keep a 30 day BP log then F/U with Pharm D in 4 weeks

## 2021-06-21 NOTE — Telephone Encounter (Signed)
Informed patient she does not need an appointment with Dr. Allyson Sabal. Explained to patient that she is to keep a log of her b/p for 30 days, taking her b/p at the same time everyday, and to bring the b/p log to her scheduled appointment with Pharm D on 07/21/21 at 2:30 pm. Pt voiced understanding.

## 2021-06-21 NOTE — Telephone Encounter (Signed)
   Pt wanted to ask if she needs to see Dr. Allyson Sabal in 30 days due to med change

## 2021-06-30 ENCOUNTER — Ambulatory Visit: Payer: Medicare Other | Admitting: Podiatry

## 2021-06-30 ENCOUNTER — Other Ambulatory Visit: Payer: Self-pay

## 2021-06-30 ENCOUNTER — Encounter: Payer: Self-pay | Admitting: Podiatry

## 2021-06-30 DIAGNOSIS — M1990 Unspecified osteoarthritis, unspecified site: Secondary | ICD-10-CM | POA: Insufficient documentation

## 2021-06-30 DIAGNOSIS — L600 Ingrowing nail: Secondary | ICD-10-CM | POA: Diagnosis not present

## 2021-06-30 DIAGNOSIS — I639 Cerebral infarction, unspecified: Secondary | ICD-10-CM | POA: Insufficient documentation

## 2021-06-30 MED ORDER — NEOMYCIN-POLYMYXIN-HC 1 % OT SOLN: OTIC | 1 refills | Status: DC

## 2021-06-30 NOTE — Progress Notes (Signed)
Subjective:  Patient ID: Robin Juarez, female    DOB: 1930-12-20,  MRN: [id number] HPI Chief Complaint  Patient presents with   Toe Pain    Hallux right - medial border, tender x 3-4 weeks, had pedicure done and noticed soreness several days later, redness at the tip, tried neosporin   New Patient (Initial Visit)    87 y.o. female presents with the above complaint.   ROS: Denies fever chills nausea vomiting muscle aches pains calf pain back pain chest pain shortness of breath.  Past Medical History:  Diagnosis Date   Arthritis    knee and hip OA- prev injection by Dr. Nelva Bush   Blood transfusion without reported diagnosis    37 yrs ago -s/p back surgery   Chest pain    Coronary artery disease    50% mid LAD by cath January 2016   Depression 1993, 2000   Fibromyalgia    GERD (gastroesophageal reflux disease)    H/O hiatal hernia    Hyperlipidemia    Hypertension    OSA (obstructive sleep apnea)    off tx as of 2020, no sx as of 2020   Osteoporosis    Positive cardiac stress test    Stroke [facility])    Urinary incontinence    UTI (lower urinary tract infection)    Past Surgical History:  Procedure Laterality Date   [place]   Back fusions  1977   missing vertebra   BACK SURGERY     fusions lumbar area   BREAST BIOPSY  1995   x 2   BREAST BIOPSY  07/10/2012   Procedure: BREAST BIOPSY WITH NEEDLE LOCALIZATION;  Surgeon: Rolm Bookbinder, MD;  Location: [place];  Service: General;  Laterality: Right;   CARDIOVASCULAR STRESS TEST  2009   Normal, Dr. Radford Pax   CATARACT EXTRACTION, BILATERAL Bilateral 2005   Childbirth  1954,1956,1957,1960   CYSTOSCOPY N/A 11/25/2012   Procedure: CYSTOSCOPY;  Surgeon: Reece Packer, MD;  Location: [place];  Service: Urology;  Laterality: N/A;   dilatation and currettage  1964   LEFT HEART CATH AND CORONARY ANGIOGRAPHY N/A 06/15/2017   Procedure: LEFT  HEART CATH AND CORONARY ANGIOGRAPHY;  Surgeon: Troy Sine, MD;  Location: [place] [facility];  Service: Cardiovascular;  Laterality: N/A;   LEFT HEART CATHETERIZATION WITH CORONARY ANGIOGRAM N/A 09/03/2014   Procedure: LEFT HEART CATHETERIZATION WITH CORONARY ANGIOGRAM;  Surgeon: Lorretta Harp, MD;  Location: [facility] CATH [facility];  Service: Cardiovascular;  Laterality: N/A;   LUMBAR LAMINECTOMY/DECOMPRESSION MICRODISCECTOMY N/A 05/01/2013   Procedure: COMPLETE DECOMPRESSIVE LUMBAR LAMINECTOMY L3-L4 MICRODISCECTOMY L3-L4 ON THE LEFT;  Surgeon: Tobi Bastos, MD;  Location: [facility];  Service: Orthopedics;  Laterality: N/A;   OVARIAN CYST REMOVAL  1964   PUBOVAGINAL SLING N/A 11/25/2012   Procedure: Gaynelle Arabian;  Surgeon: Reece Packer, MD;  Location: [facility];  Service: Urology;  Laterality: N/A;    Current Outpatient Medications:    NEOMYCIN-POLYMYXIN-HYDROCORTISONE (CORTISPORIN) 1 % SOLN OTIC solution, Apply 1-2 drops to toe BID after soaking, Disp: 10 mL, Rfl: 1   amLODipine (NORVASC) 2.5 MG tablet, Take 1 tablet (2.5 mg total) by mouth daily., Disp: 90 tablet, Rfl: 3   Baclofen 5 MG TABS, TAKE 1 TABLET (5 MG) BY MOUTH TWICE DAILY AS NEEDED (FOR MUSCLE SPASMS)., Disp: , Rfl:    busPIRone (BUSPAR) 10 MG tablet, as needed., Disp: , Rfl:  Cholecalciferol (VITAMIN D) 50 MCG (2000 UT) CAPS, Take 2 capsules (4,000 Units total) by mouth daily., Disp: , Rfl:    Coenzyme Q10 (CO Q 10 PO), Take 1 tablet by mouth daily., Disp: , Rfl:    diclofenac sodium (VOLTAREN) 1 % GEL, Apply 2 g topically 4 (four) times daily as needed (for joint aches)., Disp: 100 g, Rfl: 2   DULoxetine (CYMBALTA) 30 MG capsule, Take 90 mg by mouth daily. , Disp: , Rfl:    folic acid (FOLVITE) 628 MCG tablet, Take 800 mcg by mouth daily. , Disp: , Rfl:    gabapentin (NEURONTIN) 300 MG capsule, Take 1-2 capsules (300-600 mg total) by mouth See admin instructions. 300 mg at lunch and supper and 600 mg in  the evening, Disp: , Rfl:    HYDROcodone-acetaminophen (NORCO/VICODIN) 5-325 MG tablet, Take 1 tablet by mouth every 6 (six) hours as needed for severe pain., Disp: 10 tablet, Rfl: 0   KRILL OIL PO, Take 100 mg by mouth daily., Disp: , Rfl:    lamoTRIgine (LAMICTAL) 100 MG tablet, Take 1 tablet (100 mg total) by mouth daily after supper., Disp: , Rfl:    lidocaine (LIDODERM) 5 %, Place 1 patch onto the skin daily. Remove & Discard patch within 12 hours or as directed by MD, Disp: 30 patch, Rfl: 0   metoprolol succinate (TOPROL-XL) 25 MG 24 hr tablet, TAKE 1 TABLET BY MOUTH  DAILY, Disp: 90 tablet, Rfl: 3   MYRBETRIQ 50 MG TB24 tablet, Take 50 mg by mouth daily., Disp: , Rfl:    nystatin-triamcinolone (MYCOLOG II) cream, APPLY TO AFFECTED AREA 3 TIMES A DAY, Disp: , Rfl:    OLANZapine (ZYPREXA) 2.5 MG tablet, Take 1 tablet (2.5 mg total) by mouth at bedtime., Disp: , Rfl:   Allergies  Allergen Reactions   Digoxin Other (See Comments)    Felt weak, tired, "awful"   Nitrofurantoin Other (See Comments)    Sharp pain back of head   Alendronate Sodium Other (See Comments)    cramps   Cephalosporins     She "felt bad" while taking multiple cephalosporins but has been able tolerate amoxicillin previously.  No clear cephalosporin allergy.   Chlordiazepoxide Hcl Other (See Comments)   Cyclobenzaprine     Other reaction(s): caused patient to panic   Lisinopril Itching and Swelling   Phenobarbital-Belladonna Alk Other (See Comments)   Praluent [Alirocumab]     myalgia   Ritalin [Methylphenidate Hcl]     intolerant   Statins     Myalgia.  Intolerant of multiple statins, including pravastatin and simvastatin.   Chlordiazepoxide Other (See Comments)   Clavulanic Acid Other (See Comments)    Not an allergy.  Tolerates plain amoxil but not augmentin- diarrhea, etc with augmentin   Omeprazole Rash    rash   Oxcarbazepine Other (See Comments)    unknown   Sulfa Antibiotics Other (See Comments)     Sharp pain in back of head   Tetracycline Itching    itching   Zetia [Ezetimibe] Other (See Comments)    possible cause of aches and abnormal dreams, stopped 05/2014   Review of Systems Objective:  There were no vitals filed for this visit.  General: Well developed, nourished, in no acute distress, alert and oriented x3   Dermatological: Skin is warm, dry and supple bilateral. Nails x 10 are well maintained; remaining integument appears unremarkable at this time. There are no open sores, no preulcerative lesions, no rash or  signs of infection present.  Vascular: Dorsalis Pedis artery and Posterior Tibial artery pedal pulses are 2/4 bilateral with immedate capillary fill time. Pedal hair growth present. No varicosities and no lower extremity edema present bilateral.   Neruologic: Grossly intact via light touch bilateral. Vibratory intact via tuning fork bilateral. Protective threshold with Semmes Wienstein monofilament intact to all pedal sites bilateral. Patellar and Achilles deep tendon reflexes 2+ bilateral. No Babinski or clonus noted bilateral.   Musculoskeletal: No gross boney pedal deformities bilateral. No pain, crepitus, or limitation noted with foot and ankle range of motion bilateral. Muscular strength 5/5 in all groups tested bilateral.  Gait: Unassisted, Nonantalgic.    Radiographs:  None taken  Assessment & Plan:   Assessment: Ingrown toenail tibial border hallux right  Plan: Chemical matricectomy was performed today tibial border hallux right after local anesthetic was administered in a sterile fashion.  She tolerated procedure well without complications.  She also received a prescription for Cortisporin Otic to be applied twice daily after soaking received both oral and written home-going instruction for the soaking twice daily I will follow-up with her in 2 weeks.     Zaria Taha T. [place], [place]

## 2021-06-30 NOTE — Patient Instructions (Signed)

## 2021-07-11 ENCOUNTER — Other Ambulatory Visit: Payer: Self-pay

## 2021-07-11 MED ORDER — AMLODIPINE BESYLATE 2.5 MG PO TABS: 2.5000 mg | ORAL_TABLET | Freq: Every day | ORAL | 0 refills | Status: DC

## 2021-07-11 MED ORDER — AMLODIPINE BESYLATE 2.5 MG PO TABS: 2.5000 mg | ORAL_TABLET | Freq: Every day | ORAL | 3 refills | Status: DC

## 2021-07-11 NOTE — Telephone Encounter (Signed)
The patient called today and requested a refill of her Norvasc rx be sent over to [facility]. She states she has contacted them today and was told they have not heard back from our office.   I advised patient that I would send the refill request to the [facility] office for them to refill her rx. She voiced understanding.

## 2021-07-19 ENCOUNTER — Other Ambulatory Visit: Payer: Self-pay

## 2021-07-19 ENCOUNTER — Encounter: Payer: Self-pay | Admitting: Podiatry

## 2021-07-19 ENCOUNTER — Ambulatory Visit: Payer: Medicare Other | Admitting: Podiatry

## 2021-07-19 DIAGNOSIS — L600 Ingrowing nail: Secondary | ICD-10-CM

## 2021-07-19 DIAGNOSIS — Z9889 Other specified postprocedural states: Secondary | ICD-10-CM

## 2021-07-20 NOTE — Progress Notes (Signed)
She presents today for follow-up of her nail procedure right.  States that is still little sore I switched to Epson salt soaks.  Objective: Vital signs are stable she is alert and oriented x3.  Pulses are palpable.  Hallux right demonstrates a small amount of fibrin deposition along the tibial border.  I cleaned that out for her today there is no purulence no malodor tenderness along the medial border is very minimal in comparison to what it was.  Assessment: Slowly resolving matrixectomy.  Plan: Continue to soak every other day as stronger Epson salts during the day and leave open at bedtime.  Otherwise she will cover it.  Follow-up with me in 2 weeks if not improved

## 2021-07-21 ENCOUNTER — Ambulatory Visit: Payer: Medicare Other

## 2021-08-16 ENCOUNTER — Ambulatory Visit: Payer: Medicare Other

## 2021-08-23 ENCOUNTER — Ambulatory Visit: Payer: Medicare Other

## 2021-09-03 ENCOUNTER — Other Ambulatory Visit: Payer: Self-pay

## 2021-09-03 ENCOUNTER — Emergency Department (HOSPITAL_BASED_OUTPATIENT_CLINIC_OR_DEPARTMENT_OTHER)
Admission: EM | Admit: 2021-09-03 | Discharge: 2021-09-03 | Disposition: A | Discharge status: Home / Self Care | Payer: Medicare Other | Attending: Emergency Medicine | Admitting: Emergency Medicine

## 2021-09-03 ENCOUNTER — Encounter (HOSPITAL_BASED_OUTPATIENT_CLINIC_OR_DEPARTMENT_OTHER): Payer: Self-pay | Admitting: Emergency Medicine

## 2021-09-03 DIAGNOSIS — I10 Essential (primary) hypertension: Secondary | ICD-10-CM | POA: Diagnosis not present

## 2021-09-03 DIAGNOSIS — R112 Nausea with vomiting, unspecified: Secondary | ICD-10-CM | POA: Diagnosis present

## 2021-09-03 DIAGNOSIS — R197 Diarrhea, unspecified: Secondary | ICD-10-CM | POA: Insufficient documentation

## 2021-09-03 LAB — CBC WITH DIFFERENTIAL/PLATELET
Abs Immature Granulocytes: 0.03 10*3/uL (ref 0.00–0.07)
Basophils Absolute: 0 10*3/uL (ref 0.0–0.1)
Basophils Relative: 0 %
Eosinophils Absolute: 0 10*3/uL (ref 0.0–0.5)
Eosinophils Relative: 0 %
HCT: 45.6 % (ref 36.0–46.0)
Hemoglobin: 15.3 g/dL — ABNORMAL HIGH (ref 12.0–15.0)
Immature Granulocytes: 0 %
Lymphocytes Relative: 5 %
Lymphs Abs: 0.6 10*3/uL — ABNORMAL LOW (ref 0.7–4.0)
MCH: 31.9 pg (ref 26.0–34.0)
MCHC: 33.6 g/dL (ref 30.0–36.0)
MCV: 95 fL (ref 80.0–100.0)
Monocytes Absolute: 0.2 10*3/uL (ref 0.1–1.0)
Monocytes Relative: 2 %
Neutro Abs: 10.3 10*3/uL — ABNORMAL HIGH (ref 1.7–7.7)
Neutrophils Relative %: 93 %
Platelets: 228 10*3/uL (ref 150–400)
RBC: 4.8 MIL/uL (ref 3.87–5.11)
RDW: 13.1 % (ref 11.5–15.5)
WBC: 11.1 10*3/uL — ABNORMAL HIGH (ref 4.0–10.5)
nRBC: 0 % (ref 0.0–0.2)

## 2021-09-03 LAB — COMPREHENSIVE METABOLIC PANEL
ALT: 15 U/L (ref 0–44)
AST: 16 U/L (ref 15–41)
Albumin: 4.5 g/dL (ref 3.5–5.0)
Alkaline Phosphatase: 79 U/L (ref 38–126)
Anion gap: 10 (ref 5–15)
BUN: 15 mg/dL (ref 8–23)
CO2: 28 mmol/L (ref 22–32)
Calcium: 9.3 mg/dL (ref 8.9–10.3)
Chloride: 101 mmol/L (ref 98–111)
Creatinine, Ser: 0.8 mg/dL (ref 0.44–1.00)
GFR, Estimated: >60 mL/min (ref >60)
Glucose, Bld: 152 mg/dL — ABNORMAL HIGH (ref 70–99)
Potassium: 3.5 mmol/L (ref 3.5–5.1)
Sodium: 139 mmol/L (ref 135–145)
Total Bilirubin: 0.9 mg/dL (ref 0.3–1.2)
Total Protein: 7.4 g/dL (ref 6.5–8.1)

## 2021-09-03 LAB — LACTIC ACID, PLASMA: Lactic Acid, Venous: 1.8 mmol/L (ref 0.5–1.9)

## 2021-09-03 LAB — LIPASE, BLOOD: Lipase: <10 U/L — ABNORMAL LOW (ref 11–51)

## 2021-09-03 MED ORDER — ONDANSETRON HCL 4 MG/2ML IJ SOLN
4.0000 mg | Freq: Once | INTRAMUSCULAR | Status: AC
  Administered 2021-09-03: 4 mg via INTRAVENOUS
  Filled 2021-09-03: qty 2

## 2021-09-03 MED ORDER — ONDANSETRON 4 MG PO TBDP: 4.0000 mg | ORAL_TABLET | Freq: Three times a day (TID) | ORAL | 0 refills | Status: DC | PRN

## 2021-09-03 MED ORDER — SODIUM CHLORIDE 0.9 % IV BOLUS
1000.0000 mL | Freq: Once | INTRAVENOUS | Status: AC
  Administered 2021-09-03: 1000 mL via INTRAVENOUS

## 2021-09-03 NOTE — ED Triage Notes (Signed)
°  Patient comes in by EMS for diarrhea and nausea.  Patient states she ate dinner around 1600 yesterday and began to get sick around 2000 last night.  Patient endorses about 7 episodes of diarrhea and a lot of dry heaving.  Patient states she has intermittent nausea and "awful feeling".  No pain at this time.

## 2021-09-03 NOTE — ED Notes (Signed)
Pt tolerating PO intake with no complications or report of nausea

## 2021-09-03 NOTE — ED Provider Notes (Signed)
Emergency Department Provider Note   I have reviewed the triage vital signs and the nursing notes.   HISTORY  Chief Complaint Diarrhea and Nausea   HPI GABRIELLA WOODHEAD is a 88 y.o. female with PMH reviewed below presents to the ED with acute onset dry heaves, severe nausea, and diarrhea. Patient denies actual vomiting. Denies abdominal pain currently but describes an intermittent "awful" feeling throughout the abdomen. From time to time. She denies other sick contacts. She reports that her family got takeout and she got something different from everyone else. Denies any CP or pressure. No SOB. No fever.     Past Medical History:  Diagnosis Date   Arthritis    knee and hip OA- prev injection by Dr. Ethelene Hal   Blood transfusion without reported diagnosis    37 yrs ago -s/p back surgery   Chest pain    Coronary artery disease    50% mid LAD by cath January 2016   Depression 1993, 2000   Fibromyalgia    GERD (gastroesophageal reflux disease)    H/O hiatal hernia    Hyperlipidemia    Hypertension    OSA (obstructive sleep apnea)    off tx as of 2020, no sx as of 2020   Osteoporosis    Positive cardiac stress test    Stroke [facility])    Urinary incontinence    UTI (lower urinary tract infection)     Review of Systems  Constitutional: No fever/chills Eyes: No visual changes. ENT: No sore throat. Cardiovascular: Denies chest pain. Respiratory: Denies shortness of breath. Gastrointestinal: Intermittent abdominal pain. Positive nausea, no vomiting. Positive diarrhea.  No constipation. Musculoskeletal: Negative for back pain. Skin: Negative for rash. Neurological: Negative for headaches.  ____________________________________________   PHYSICAL EXAM:  VITAL SIGNS: ED Triage Vitals [09/03/21 0522]  Enc Vitals Group     BP (!) 171/71     Pulse Rate 86     Resp 18     Temp 98.1 F (36.7 C)     Temp Source Oral     SpO2 97 %     Weight 186 lb (84.4 kg)     Height  5\' 1"  (1.549 m)   Constitutional: Alert and oriented. Well appearing and in no acute distress. Eyes: Conjunctivae are normal. Head: Atraumatic. Nose: No congestion/rhinnorhea. Mouth/Throat: Mucous membranes are dry.  Neck: No stridor.  Cardiovascular: Normal rate, regular rhythm. Good peripheral circulation. Grossly normal heart sounds.   Respiratory: Normal respiratory effort.  No retractions. Lungs CTAB. Gastrointestinal: Soft and nontender. No distention.  Musculoskeletal: No gross deformities of extremities. Neurologic:  Normal speech and language. Skin:  Skin is warm, dry and intact. No rash noted.   ____________________________________________   LABS (all labs ordered are listed, but only abnormal results are displayed)  Labs Reviewed  COMPREHENSIVE METABOLIC PANEL - Abnormal; Notable for the following components:      Result Value   Glucose, Bld 152 (*)    All other components within normal limits  CBC WITH DIFFERENTIAL/PLATELET - Abnormal; Notable for the following components:   WBC 11.1 (*)    Hemoglobin 15.3 (*)    Neutro Abs 10.3 (*)    Lymphs Abs 0.6 (*)    All other components within normal limits  LIPASE, BLOOD - Abnormal; Notable for the following components:   Lipase <10 (*)    All other components within normal limits  LACTIC ACID, PLASMA   ____________________________________________   PROCEDURES  Procedure(s) performed:   Procedures  None ____________________________________________   INITIAL IMPRESSION / ASSESSMENT AND PLAN / ED COURSE  Pertinent labs & imaging results that were available during my care of the patient were reviewed by me and considered in my medical decision making (see chart for details).   This patient is Presenting for Evaluation of nausea and abdominal pain, which does require a range of treatment options, and is a complaint that involves a high risk of morbidity and mortality.  The Differential Diagnoses includes but is  not exclusive to acute cholecystitis, intrathoracic causes for epigastric abdominal pain, gastritis, duodenitis, pancreatitis, small bowel or large bowel obstruction, abdominal aortic aneurysm, hernia, gastritis, etc.   Critical Interventions- IVF and nausea medications.    Reassessment after intervention: Patient is feeling much better. Nausea has resolved. No pain in the abdomen or chest.    I did Additional Historical Information from EMS. Confirm history as provided. No vital sign abnormalities. No treatment en route.   I decided to review pertinent External Data, and in summary no recent ED visits for similar symptoms. Last seen in June 2022 for back pain.    Clinical Laboratory Tests Ordered, included CBC, CMP, Lipase, and lactic acid. Normal lactic acid. No AKI. Normal electrolytes. Normal lipase.   Radiologic Tests: Considered abdominal imaging, however, patient has a non-tender abdomen with largely unremarkable labs and much improved after Zofran and IVF. Will defer imaging.  Cardiac Monitor Tracing which shows NSR.  Medical Decision Making: Summary:  Patient presents emergency department female with acute onset dry heaving and intermittent abdominal pain/cramping.  Patient feels like this is related to what she ate for dinner and symptoms occurred shortly after eating.  Her abdominal exam is fairly unimpressive.  We will start with IV fluids, labs, nausea medication.  No radiation of symptoms into the chest or other symptoms to strongly suspect ACS, PE, dissection, AAA.  Ischemic bowel is much lower my differential.  May consider advanced abdominal imaging but will follow labs and reassess after initial treatment.   06:45 AM  Patient up and ambulatory to the bathroom without assistance. Tolerating PO. Plan for discharge with Rx sent for Zofran.   Disposition: discharge   ____________________________________________  FINAL CLINICAL IMPRESSION(S) / ED DIAGNOSES  Final diagnoses:   Nausea and vomiting, unspecified vomiting type     MEDICATIONS GIVEN DURING THIS VISIT:  Medications  sodium chloride 0.9 % bolus 1,000 mL (0 mLs Intravenous Stopped 09/03/21 0637)  ondansetron (ZOFRAN) injection 4 mg (4 mg Intravenous Given 09/03/21 0536)     NEW OUTPATIENT MEDICATIONS STARTED DURING THIS VISIT:  New Prescriptions   ONDANSETRON (ZOFRAN-ODT) 4 MG DISINTEGRATING TABLET    Take 1 tablet (4 mg total) by mouth every 8 (eight) hours as needed.    Note:  This document was prepared using Dragon voice recognition software and may include unintentional dictation errors.  Alona Bene, MD, [facility] Emergency Medicine    Jarrad Mclees, Arlyss Repress, MD 09/03/21 [id number]

## 2021-09-03 NOTE — Discharge Instructions (Addendum)

## 2021-09-05 ENCOUNTER — Encounter (HOSPITAL_BASED_OUTPATIENT_CLINIC_OR_DEPARTMENT_OTHER): Payer: Self-pay | Admitting: Emergency Medicine

## 2021-09-05 ENCOUNTER — Telehealth: Payer: Self-pay | Admitting: Family Medicine

## 2021-09-05 ENCOUNTER — Emergency Department (HOSPITAL_BASED_OUTPATIENT_CLINIC_OR_DEPARTMENT_OTHER)
Admission: EM | Admit: 2021-09-05 | Discharge: 2021-09-05 | Disposition: A | Discharge status: Home / Self Care | Payer: Medicare Other | Attending: Emergency Medicine | Admitting: Emergency Medicine

## 2021-09-05 ENCOUNTER — Other Ambulatory Visit: Payer: Self-pay

## 2021-09-05 DIAGNOSIS — E876 Hypokalemia: Secondary | ICD-10-CM | POA: Diagnosis not present

## 2021-09-05 DIAGNOSIS — Z79891 Long term (current) use of opiate analgesic: Secondary | ICD-10-CM | POA: Insufficient documentation

## 2021-09-05 DIAGNOSIS — R197 Diarrhea, unspecified: Secondary | ICD-10-CM | POA: Diagnosis not present

## 2021-09-05 LAB — URINALYSIS, ROUTINE W REFLEX MICROSCOPIC
Bilirubin Urine: NEGATIVE
Glucose, UA: NEGATIVE mg/dL
Hgb urine dipstick: NEGATIVE
Ketones, ur: NEGATIVE mg/dL
Leukocytes,Ua: NEGATIVE
Nitrite: NEGATIVE
Protein, ur: NEGATIVE mg/dL
Specific Gravity, Urine: <1.005 — ABNORMAL LOW (ref 1.005–1.030)
pH: 6.5 (ref 5.0–8.0)

## 2021-09-05 LAB — COMPREHENSIVE METABOLIC PANEL
ALT: 30 U/L (ref 0–44)
AST: 39 U/L (ref 15–41)
Albumin: 4.1 g/dL (ref 3.5–5.0)
Alkaline Phosphatase: 53 U/L (ref 38–126)
Anion gap: 9 (ref 5–15)
BUN: 8 mg/dL (ref 8–23)
CO2: 27 mmol/L (ref 22–32)
Calcium: 8.6 mg/dL — ABNORMAL LOW (ref 8.9–10.3)
Chloride: 101 mmol/L (ref 98–111)
Creatinine, Ser: 0.8 mg/dL (ref 0.44–1.00)
GFR, Estimated: >60 mL/min (ref >60)
Glucose, Bld: 100 mg/dL — ABNORMAL HIGH (ref 70–99)
Potassium: 3.2 mmol/L — ABNORMAL LOW (ref 3.5–5.1)
Sodium: 137 mmol/L (ref 135–145)
Total Bilirubin: 0.5 mg/dL (ref 0.3–1.2)
Total Protein: 6.6 g/dL (ref 6.5–8.1)

## 2021-09-05 LAB — CBC
HCT: 40.3 % (ref 36.0–46.0)
Hemoglobin: 13.7 g/dL (ref 12.0–15.0)
MCH: 31.9 pg (ref 26.0–34.0)
MCHC: 34 g/dL (ref 30.0–36.0)
MCV: 93.9 fL (ref 80.0–100.0)
Platelets: 226 10*3/uL (ref 150–400)
RBC: 4.29 MIL/uL (ref 3.87–5.11)
RDW: 13.2 % (ref 11.5–15.5)
WBC: 9.2 10*3/uL (ref 4.0–10.5)
nRBC: 0 % (ref 0.0–0.2)

## 2021-09-05 LAB — LIPASE, BLOOD: Lipase: <10 U/L — ABNORMAL LOW (ref 11–51)

## 2021-09-05 MED ORDER — SODIUM CHLORIDE 0.9 % IV SOLN: 1.0000 g | Freq: Once | INTRAVENOUS | Status: DC

## 2021-09-05 MED ORDER — SODIUM CHLORIDE 0.9 % IV BOLUS
1000.0000 mL | Freq: Once | INTRAVENOUS | Status: AC
  Administered 2021-09-05: 1000 mL via INTRAVENOUS

## 2021-09-05 MED ORDER — POTASSIUM CHLORIDE CRYS ER 20 MEQ PO TBCR
40.0000 meq | EXTENDED_RELEASE_TABLET | Freq: Once | ORAL | Status: AC
  Administered 2021-09-05: 40 meq via ORAL
  Filled 2021-09-05: qty 2

## 2021-09-05 MED ORDER — CALCIUM GLUCONATE-NACL 1-0.675 GM/50ML-% IV SOLN
1.0000 g | Freq: Once | INTRAVENOUS | Status: AC
  Administered 2021-09-05: 1000 mg via INTRAVENOUS

## 2021-09-05 MED ORDER — ONDANSETRON HCL 4 MG/2ML IJ SOLN
4.0000 mg | Freq: Once | INTRAMUSCULAR | Status: AC
Start: 2021-09-05 — End: 2021-09-05
  Administered 2021-09-05: 4 mg via INTRAVENOUS
  Filled 2021-09-05: qty 2

## 2021-09-05 NOTE — ED Triage Notes (Signed)
Returning to ER (came Friday night for same) for nausea, diarrhea. Was seen by PCP today and instructed to return to ER. Endorses mild "cramping" generalized abd pain, >6x diarrhea today, weakness at home, lightheadedness. . Has tried imodium, pepto, Pedialyte at home, eating bland foods. Has not been on abx within 3 mo.

## 2021-09-05 NOTE — Telephone Encounter (Signed)
Lori with [facility] called stating that pt needed to be seen within 3 to 4 hrs because of Diarrhea. Cecille Rubin would like for something to be call in. Please advise.

## 2021-09-05 NOTE — Telephone Encounter (Signed)
I appreciate them asking for input.  If she has profuse diarrhea to the point of having dizziness and dry mouth (in spite of drinking pedialyte), then my concern is that she is going to need IV fluids.  We don't have that at the office.  The only option I know of at this point would be the ER.  That is the best I can think of- I am worried that if she waits she could get worse.  Thanks.

## 2021-09-05 NOTE — Telephone Encounter (Signed)
Mr. Leim Fabry called in for his wife due to she is having diarrhea and she went to the hospital and the medication is not working. And she was triage.

## 2021-09-05 NOTE — Telephone Encounter (Signed)
See other phone note from [place].  Response there.

## 2021-09-05 NOTE — Telephone Encounter (Signed)
Spoke to husband. Looks like pt went to ER 09-03-21 for N/V/D. Started with imodium. Then tried Pepto Saturday night. Diarrhea stopped for awhile. Came back last evening and having it now. Neighbor suggested a medicine.

## 2021-09-05 NOTE — ED Notes (Signed)
Pt verbalizes understanding of discharge instructions. Opportunity for questioning and answers were provided. Pt discharged from ED to home with daughter.    

## 2021-09-05 NOTE — Telephone Encounter (Signed)
Called and spoke with patient's husband. He said that patient does not have dizziness or dry mouth he was wrong about that. States she is very weak though. I advised them to go to the hospital per Dr. Damita Dunnings as patient could be dehydrated and need IV fluids. He refused and stated patient would not go. I discussed this again with Dr. Damita Dunnings and he advised again that patient needs to be seen at ER.

## 2021-09-05 NOTE — Discharge Instructions (Signed)
Call your primary care doctor or specialist as discussed in the next 2-3 days.   Return immediately back to the ER if:  Your symptoms worsen within the next 12-24 hours. You develop new symptoms such as new fevers, persistent vomiting, new pain, shortness of breath, or new weakness or numbness, or if you have any other concerns.  

## 2021-09-05 NOTE — Telephone Encounter (Signed)
Phone note has not come from access nurse yet. Pt seen [facility] ED on 09/03/21 pt was not having diarrhea then per pts husband. Access nurse told Suzette Battiest pt needed to be seen within 4 hrs. No available appt s and if need be seen then needs to go back to ED for eval. I spoke with Mr Cornman and she has had watery diarrhea 6 times in last hour. Immodium not helping. Diarrhea started on 09/04/21.Pt has been taking pepto bismol and pt having tarry dark stools. Pt is dizzy and dry mouth and no abd pain. Pt should go back to ED for eval for possible dehydration. Pt has been trying to drink pedalyte and gatorade but not sure how much she is drinking now. Mr Louissaint said he does not think pt will go to ED due to wait time and wants med called in by Dr Para March. I spoke with Shanda Bumps CMA and Dr Para March an she are still in office and I am sending note now and Mr Roh request cb after Dr Para March reviews note. [facility].

## 2021-09-05 NOTE — ED Provider Notes (Signed)
[facility] EMERGENCY DEPT Provider Note   CSN: [id number] Arrival date & time: 09/05/21  1845     History  Chief Complaint  Patient presents with   Nausea    Robin Juarez is a 88 y.o. female.  Patient presents ER chief complaint of persistent diarrhea.  She was seen in the ER few days ago and work-up for diarrhea and nausea at the time and ultimately discharged home.  She states that she does not have any vomiting but she has persistent multiple episodes of diarrhea nonbloody nonbilious.  She spoke with her doctor they are concerned that she may be getting too dehydrated and sent her back to the ER.  Patient states that she has been urinating several times a day still perhaps not as much as she normally does but still urinating 2-3 times in the last 24 hours.  Denies any pain denies any chest pain or belly pain.  No reports of fevers or cough.  She has been taking Imodium at home without significant improvement of her diarrhea.  Denies any recent travel or recent antibiotic use.      Home Medications Prior to Admission medications   Medication Sig Start Date End Date Taking? Authorizing Provider  amLODipine (NORVASC) 2.5 MG tablet Take 1 tablet (2.5 mg total) by mouth daily. 07/11/21 10/09/21  Lorretta Harp, MD  Baclofen 5 MG TABS TAKE 1 TABLET (5 MG) BY MOUTH TWICE DAILY AS NEEDED (FOR MUSCLE SPASMS). 02/18/21   Tonia Ghent, MD  busPIRone (BUSPAR) 10 MG tablet as needed.    [provider]  Cholecalciferol (VITAMIN D) 50 MCG (2000 UT) CAPS Take 2 capsules (4,000 Units total) by mouth daily. 06/10/20   Tonia Ghent, MD  Coenzyme Q10 (CO Q 10 PO) Take 1 tablet by mouth daily.    [provider]  diclofenac sodium (VOLTAREN) 1 % GEL Apply 2 g topically 4 (four) times daily as needed (for joint aches). 07/23/17   Tonia Ghent, MD  DULoxetine (CYMBALTA) 30 MG capsule Take 90 mg by mouth daily.     [provider]  folic acid  (FOLVITE) 916 MCG tablet Take 800 mcg by mouth daily.     [provider]  gabapentin (NEURONTIN) 300 MG capsule Take 1-2 capsules (300-600 mg total) by mouth See admin instructions. 300 mg at lunch and supper and 600 mg in the evening 06/10/20   Tonia Ghent, MD  HYDROcodone-acetaminophen (NORCO/VICODIN) 5-325 MG tablet Take 1 tablet by mouth every 6 (six) hours as needed for severe pain. 01/31/21   Ripley Fraise, MD  KRILL OIL PO Take 100 mg by mouth daily.    [provider]  lamoTRIgine (LAMICTAL) 100 MG tablet Take 1 tablet (100 mg total) by mouth daily after supper. 05/20/19   Tonia Ghent, MD  lidocaine (LIDODERM) 5 % Place 1 patch onto the skin daily. Remove & Discard patch within 12 hours or as directed by MD 02/09/21   Truddie Hidden, MD  metoprolol succinate (TOPROL-XL) 25 MG 24 hr tablet TAKE 1 TABLET BY MOUTH  DAILY 09/30/20   Lorretta Harp, MD  MYRBETRIQ 50 MG TB24 tablet Take 50 mg by mouth daily. 01/18/21   [provider]  NEOMYCIN-POLYMYXIN-HYDROCORTISONE (CORTISPORIN) 1 % SOLN OTIC solution Apply 1-2 drops to toe BID after soaking 06/30/21   Hyatt, Max T, DPM  nystatin-triamcinolone (MYCOLOG II) cream APPLY TO AFFECTED AREA 3 TIMES A DAY 12/29/20   [provider]  OLANZapine (ZYPREXA) 2.5 MG tablet Take 1 tablet (2.5 mg total) by mouth at bedtime. 08/05/15   Tonia Ghent, MD  ondansetron (ZOFRAN-ODT) 4 MG disintegrating tablet Take 1 tablet (4 mg total) by mouth every 8 (eight) hours as needed. 09/03/21   Long, Wonda Olds, MD      Allergies    Digoxin, Nitrofurantoin, Alendronate sodium, Cephalosporins, Chlordiazepoxide hcl, Cyclobenzaprine, Lisinopril, Phenobarbital-belladonna alk, Praluent [alirocumab], Ritalin [methylphenidate hcl], Statins, Chlordiazepoxide, Clavulanic acid, Omeprazole, Oxcarbazepine, Sulfa antibiotics, Tetracycline, and Zetia [ezetimibe]    Review of Systems   Review of Systems  Constitutional:  Negative  for fever.  HENT:  Negative for ear pain.   Eyes:  Negative for pain.  Respiratory:  Negative for cough.   Cardiovascular:  Negative for chest pain.  Gastrointestinal:  Negative for abdominal pain.  Genitourinary:  Negative for flank pain.  Musculoskeletal:  Negative for back pain.  Skin:  Negative for rash.  Neurological:  Negative for headaches.   Physical Exam Updated Vital Signs BP (!) 152/74    Pulse 69    Temp 97.9 F (36.6 C)    Resp 20    Wt 79.8 kg    SpO2 100%    BMI 33.25 kg/m  Physical Exam Constitutional:      General: She is not in acute distress.    Appearance: Normal appearance.  HENT:     Head: Normocephalic.     Nose: Nose normal.  Eyes:     Extraocular Movements: Extraocular movements intact.  Cardiovascular:     Rate and Rhythm: Normal rate.  Pulmonary:     Effort: Pulmonary effort is normal.  Abdominal:     Tenderness: There is no abdominal tenderness. There is no guarding or rebound.  Musculoskeletal:        General: Normal range of motion.     Cervical back: Normal range of motion.  Neurological:     General: No focal deficit present.     Mental Status: She is alert. Mental status is at baseline.    ED Results / Procedures / Treatments   Labs (all labs ordered are listed, but only abnormal results are displayed) Labs Reviewed  LIPASE, BLOOD - Abnormal; Notable for the following components:      Result Value   Lipase <10 (*)    All other components within normal limits  COMPREHENSIVE METABOLIC PANEL - Abnormal; Notable for the following components:   Potassium 3.2 (*)    Glucose, Bld 100 (*)    Calcium 8.6 (*)    All other components within normal limits  CBC  URINALYSIS, ROUTINE W REFLEX MICROSCOPIC    EKG EKG Interpretation  Date/Time:  Monday September 05 2021 19:21:17 EST Ventricular Rate:  69 PR Interval:  152 QRS Duration: 88 QT Interval:  392 QTC Calculation: 420 R Axis:   -8 Text Interpretation: Normal sinus rhythm Minimal  voltage criteria for LVH, may be normal variant ( R in aVL ) Cannot rule out Anterior infarct , age undetermined Abnormal ECG When compared with ECG of 31-Jan-2021 05:21, PREVIOUS ECG IS PRESENT Confirmed by Thamas Jaegers ([id number]) on 09/05/2021 9:02:36 PM  Radiology No results found.  Procedures Procedures    Medications Ordered in ED Medications  calcium gluconate 1 g/ 50 mL sodium chloride IVPB (1,000 mg Intravenous New Bag/Given 09/05/21 2153)  sodium chloride 0.9 % bolus 1,000 mL (1,000 mLs Intravenous New Bag/Given 09/05/21 2151)  ondansetron (ZOFRAN) injection 4 mg (4 mg Intravenous Given 09/05/21 2150)  potassium chloride SA (KLOR-CON M) CR tablet 40 mEq (40 mEq Oral Given 09/05/21 2140)    ED Course/ Medical Decision Making/ A&P                           Medical Decision Making 88 year old female presents with persistent diarrhea recently evaluated with negative work-up.    Amount and/or Complexity of Data Reviewed Independent Historian: caregiver External Data Reviewed: labs.    Details: Recent visit lab values with reviewed and appear unremarkable. Labs: ordered. Discussion of management or test interpretation with external provider(s): Patient appears comfortable and in no acute distress not in any pain.  Donnazyme is benign.  Labs show mild hypokalemia and hypocalcemia which was repleted here in the ER.  Patient given IV fluids agitation as well.  Vital signs remained stable she continues to make urine.  Will recommend continued outpatient follow-up with her doctor within the week.  Recommending return if she has significant decreased urine output, pain, other signs of dehydration such as dry mouth or inability tolerate any oral intake to return immediately to the ER.  Otherwise continue outpatient follow-up with her doctor.           Final Clinical Impression(s) / ED Diagnoses Final diagnoses:  Diarrhea, unspecified type    Rx / DC Orders ED Discharge Orders      None         Luna Fuse, MD 09/05/21 2238

## 2021-09-06 NOTE — Telephone Encounter (Signed)
Spoke with patient and see is doing much better today. She has been eating a little more today and has not had diarrhea today.

## 2021-09-06 NOTE — Telephone Encounter (Signed)
Noted.  If the diarrhea doesn't improve in the next 24 hours, then please let me know.  I saw that she went in and got IV fluids.  I hope she feels better soon.  Thanks.

## 2021-09-06 NOTE — Telephone Encounter (Signed)
[facility] - Client TELEPHONE ADVICE RECORD [facility]urse Patient Name: Robin Juarez [facility] Gender: Female DOB: 1931/02/21 Age: 88 Y 2 M 17 D Return Phone Number: [phone] (Primary), [phone] (Secondary) Address: [place]/ [place]/ [place] [address] Client [facility] - Client Client Site [facility] - Day Provider Raechel Ache - MD Contact Type Call Who Is Calling Patient / Member / Family / Caregiver Call Type Triage / Clinical Caller Name Anndee Connett Relationship To Patient Spouse Return Phone Number [phone] (Primary) Chief Complaint Diarrhea Reason for Call Symptomatic / Request for Health Information Initial Comment Caller states his wife went to the hospital for diarrhea on Saturday morning. She wants to know if there is something else she can take because the stuff from the hospital is to work. Translation No Nurse Assessment Nurse: Harlon Ditty, RN, Vernona Rieger Date/Time Lamount Cohen Time): 09/05/2021 4:05:22 PM Confirm and document reason for call. If symptomatic, describe symptoms. ---Caller states his wife went to the hospital for diarrhea on Saturday morning was d/c with a med for diarrhea, but did not working anymore. Has had 6 episodes in 1 hr., is lightheaded and dizzy when she gets up. No fever, is able to drink fluids, no vomiting but has nausea. Does the patient have any new or worsening symptoms? ---Yes Will a triage be completed? ---Yes Related visit to physician within the last 2 weeks? ---Yes Does the PT have any chronic conditions? (i.e. diabetes, asthma, this includes High risk factors for pregnancy, etc.) ---Yes List chronic conditions. ---depression, HTN, Is this a behavioral health or substance abuse call? ---No Guidelines Guideline Title Affirmed Question Affirmed Notes Nurse Date/Time (Eastern Time) Diarrhea [1] SEVERE diarrhea (e.g., 7 or more times /  day more than normal) AND [2] age > 79 years Audery Amel 09/05/2021 4:10:45 PM PLEASE NOTE: All timestamps contained within this report are represented as [place] Standard Time. CONFIDENTIALTY NOTICE: This fax transmission is intended only for the addressee. It contains information that is legally privileged, confidential or otherwise protected from use or disclosure. If you are not the intended recipient, you are strictly prohibited from reviewing, disclosing, copying using or disseminating any of this information or taking any action in reliance on or regarding this information. If you have received this fax in error, please notify us immediately by telephone so that we can arrange for its return to [place]. Phone: [phone], Toll-Free: [phone], Fax: [phone] Page: 2 of 2 Call Id: [id number] Disp. Time Lamount Cohen Time) Disposition Final User 09/05/2021 4:26:34 PM See HCP within 4 Hours (or PCP triage) Yes Harlon Ditty, RN, Durenda Guthrie Disagree/Comply Comply Caller Understands Yes PreDisposition Call Doctor Care Advice Given Per Guideline SEE HCP (OR PCP TRIAGE) WITHIN 4 HOURS: * IF OFFICE WILL BE OPEN: You need to be seen within the next 3 or 4 hours. Call your doctor (or NP/PA) now or as soon as the office opens. CARE ADVICE given per Diarrhea (Adult) guideline. CALL BACK IF: * You become worse Comments User: Eulah Citizen, RN Date/Time Lamount Cohen Time): 09/05/2021 4:25:48 PM warm transferred caller to [place] on back line. Referrals Warm transfer to backlin

## 2021-09-06 NOTE — Telephone Encounter (Signed)
Per chart review tab pt was seen at [facility] ED. Sending note to DR Damita Dunnings and Janett Billow CMA.

## 2021-09-09 ENCOUNTER — Other Ambulatory Visit: Payer: Self-pay | Admitting: Cardiovascular Disease

## 2021-09-13 NOTE — Telephone Encounter (Signed)
Spoke with patient and given Dr. Josefine Class comments.   She is aware if sxs worsen/diarrhea returns or any new concerns to please call us back.  Patient thanks [place] for our follow up.

## 2021-09-13 NOTE — Telephone Encounter (Signed)
I suspect the issue wasn't from medication wash or malabsorption.  It was more likely from fluid/electrolyte loss with the diarrhea and unfortunately it can take days to get back to baseline.  If she is not having progressive/escalating diarrhea, then I would continue with fluids and she should gradually get better in the next few days.  Thanks.

## 2021-09-13 NOTE — Telephone Encounter (Signed)
Pt husband called stating that pt was feeling better but now pt is feeling weak. Pt husband states there is no more diarrhea but feeling very fatigue. Please advise.

## 2021-09-13 NOTE — Telephone Encounter (Signed)
Triaged patient's complaint of weakness:  C/O:  in past 2 days started feeling weaker and more fatigued than last week. "Just not feeling quite myself".  Cannot put her finger on anything   GI/GU:  Reports 1 loose stool yesterday only none since.  No signs of blood or abnormal color, odor etc.  No abdominal or pelvic pain, urination WNL.  Patient states she is eating and drinking normally, appetite isn't the best but she is pushing Gatorade and Water.    RESP:  Denies any SOB CARD:  Denies any chest pain or other abnormality.   NEURO: patient alert, oriented, strong in speech and appropriate with responses.  Denies any numbness/Tingling or changes in gait.  MUSCULOSKELETAL:  No falls, states ambulating WNL no feelings of weakness or SOB with ambulation.    She is wondering if her general fatigue/malaise feeling is due to having likely lost a lot of her medications through her loose bowels last week?  She cannot identify any other particular symptoms.    She has been taking all of her medications as prescribed with only the one episode of diarrhea in recent days.  Otherwise, she states she is doing okay.   Will forward to Dr. Para March for further tx advice.

## 2021-09-15 ENCOUNTER — Ambulatory Visit: Payer: Medicare Other | Admitting: Pharmacist Clinician (PhC)/ Clinical Pharmacy Specialist

## 2021-09-15 ENCOUNTER — Other Ambulatory Visit: Payer: Self-pay

## 2021-09-15 VITALS — BP 140/82 | HR 78 | Resp 15 | Ht 61.0 in | Wt 179.0 lb

## 2021-09-15 DIAGNOSIS — E782 Mixed hyperlipidemia: Secondary | ICD-10-CM | POA: Diagnosis not present

## 2021-09-15 DIAGNOSIS — I10 Essential (primary) hypertension: Secondary | ICD-10-CM

## 2021-09-15 NOTE — Progress Notes (Signed)
09/16/2021 Diasha Castleman [place] 1931/04/09 [id number]   HPI:  Robin Juarez is a 88 y.o. female patient of Dr Gwenlyn Found, with a [place] below who presents today for hypertension clinic evaluation.  She has been followed by our clinic in the past for cholesterol management, but has been intolerant to multiple medications.  Currently for her BP she takes just amlodipine 2.5 mg daily along with metoprolol succ 25 mg.  Last fall she called in with a concern that she was having an allergic reaction to lisinopril.  She was advised to discontinue and instead take amlodipine 2.5 mg daily and follow up with CVRR in 4 weeks.    Today she is in the office with her husband.  She has been monitoring her BP at home and brings in a list of 37 readings, but no dates.  States that the last reading was done this morning prior to coming into the office.  No concerns with her current medications and states compliance with dosing.     Past Medical History: hyperlipidemia 10/22 LDL 88 - no meds  CAD Non-critical, 50% stenosis of 1st diag, several others 20-40%  CVA CT shows older periventricular lacunar infarct     Blood Pressure Goal:  130/80  Current Medications: amlodipine 2.5 mg qd, metoprolol succ 25 mg qd  Social Hx: no tobacco, no alcohol, coffee mix of 1/2 and 1/2 - 1 to 2 cups per day, some decaf tea, occasional diet cola (caffeine fee)  Diet: some eating out, more sit down ,meat and veggies; some added salt with cooking, occasionally at table; no beef or pork, mostly fish and chicken; no snacking much - occasional peanut butter crackers  Exercise: no, balance not good  Home BP readings: has 37 readings with her that average 134/74 (range 118-155/62-82  Intolerances: see allergy list  Labs: 1/23:  Na 137, K 3.2, Glu 100, BUN 8, SCr 0.8, GFR > 60   Wt Readings from Last 3 Encounters:  09/15/21 179 lb (81.2 kg)  09/05/21 176 lb (79.8 kg)  09/03/21 186 lb (84.4 kg)   BP Readings from Last 3  Encounters:  09/15/21 140/82  09/05/21 (!) 160/77  09/03/21 (!) 150/75   Pulse Readings from Last 3 Encounters:  09/15/21 78  09/05/21 70  09/03/21 89    Current Outpatient Medications  Medication Sig Dispense Refill   amLODipine (NORVASC) 2.5 MG tablet Take 1 tablet (2.5 mg total) by mouth daily. 90 tablet 3   Baclofen 5 MG TABS TAKE 1 TABLET (5 MG) BY MOUTH TWICE DAILY AS NEEDED (FOR MUSCLE SPASMS).     Cholecalciferol (VITAMIN D) 50 MCG (2000 UT) CAPS Take 2 capsules (4,000 Units total) by mouth daily.     Coenzyme Q10 (CO Q 10 PO) Take 1 tablet by mouth daily.     diclofenac sodium (VOLTAREN) 1 % GEL Apply 2 g topically 4 (four) times daily as needed (for joint aches). 100 g 2   DULoxetine (CYMBALTA) 30 MG capsule Take 90 mg by mouth daily.      folic acid (FOLVITE) 621 MCG tablet Take 800 mcg by mouth daily.      gabapentin (NEURONTIN) 300 MG capsule Take 1-2 capsules (300-600 mg total) by mouth See admin instructions. 300 mg at lunch and supper and 600 mg in the evening (Patient taking differently: Take 300 mg by mouth in the morning, at noon, in the evening, and at bedtime.)     HYDROcodone-acetaminophen (NORCO/VICODIN) 5-325 MG tablet  Take 1 tablet by mouth every 6 (six) hours as needed for severe pain. 10 tablet 0   KRILL OIL PO Take 100 mg by mouth daily.     lamoTRIgine (LAMICTAL) 100 MG tablet Take 1 tablet (100 mg total) by mouth daily after supper.     metoprolol succinate (TOPROL-XL) 25 MG 24 hr tablet TAKE 1 TABLET BY MOUTH  DAILY 90 tablet 3   OLANZapine (ZYPREXA) 2.5 MG tablet Take 1 tablet (2.5 mg total) by mouth at bedtime.     Red Yeast Rice Extract (RED YEAST RICE PO) Take by mouth.     busPIRone (BUSPAR) 10 MG tablet as needed.     lidocaine (LIDODERM) 5 % Place 1 patch onto the skin daily. Remove & Discard patch within 12 hours or as directed by MD (Patient not taking: Reported on 09/15/2021) 30 patch 0   MYRBETRIQ 50 MG TB24 tablet Take 50 mg by mouth daily.  (Patient not taking: Reported on 09/15/2021)     NEOMYCIN-POLYMYXIN-HYDROCORTISONE (CORTISPORIN) 1 % SOLN OTIC solution Apply 1-2 drops to toe BID after soaking (Patient not taking: Reported on 09/15/2021) 10 mL 1   nystatin-triamcinolone (MYCOLOG II) cream APPLY TO AFFECTED AREA 3 TIMES A DAY (Patient not taking: Reported on 09/15/2021)     ondansetron (ZOFRAN-ODT) 4 MG disintegrating tablet Take 1 tablet (4 mg total) by mouth every 8 (eight) hours as needed. (Patient not taking: Reported on 09/15/2021) 20 tablet 0   No current facility-administered medications for this visit.    Allergies  Allergen Reactions   Digoxin Other (See Comments)    Felt weak, tired, "awful"   Nitrofurantoin Other (See Comments)    Sharp pain back of head   Alendronate Sodium Other (See Comments)    cramps   Cephalosporins     She "felt bad" while taking multiple cephalosporins but has been able tolerate amoxicillin previously.  No clear cephalosporin allergy.   Chlordiazepoxide Hcl Other (See Comments)   Cyclobenzaprine     Other reaction(s): caused patient to panic   Lisinopril Itching and Swelling   Phenobarbital-Belladonna Alk Other (See Comments)   Praluent [Alirocumab]     myalgia   Repatha [Evolocumab]     myalgias   Ritalin [Methylphenidate Hcl]     intolerant   Statins     Myalgia.  Intolerant of multiple statins, including pravastatin and simvastatin.   Trimethoprim     Wheezing lip swelling   Chlordiazepoxide Other (See Comments)   Clavulanic Acid Other (See Comments)    Not an allergy.  Tolerates plain amoxil but not augmentin- diarrhea, etc with augmentin   Omeprazole Rash    rash   Oxcarbazepine Other (See Comments)    unknown   Sulfa Antibiotics Other (See Comments)    Sharp pain in back of head   Tetracycline Itching    itching   Zetia [Ezetimibe] Other (See Comments)    possible cause of aches and abnormal dreams, stopped 05/2014    Past Medical History:  Diagnosis Date    Arthritis    knee and hip OA- prev injection by Dr. Ethelene Hal   Blood transfusion without reported diagnosis    37 yrs ago -s/p back surgery   Chest pain    Coronary artery disease    50% mid LAD by cath January 2016   Depression 1993, 2000   Fibromyalgia    GERD (gastroesophageal reflux disease)    H/O hiatal hernia    Hyperlipidemia    Hypertension  OSA (obstructive sleep apnea)    off tx as of 2020, no sx as of 2020   Osteoporosis    Positive cardiac stress test    Stroke [facility])    Urinary incontinence    UTI (lower urinary tract infection)     Blood pressure 140/82, pulse 78, resp. rate 15, height $RemoveBe'5\' 1"'[id number]$  (1.549 m), weight 179 lb (81.2 kg), SpO2 97 %.  Essential hypertension Patient with essential hypertension, doing well overall on amlodipine 2.5 mg.  Discussed increasing dose to 5 mg, however with her advanced age, do not wish to induce hypotension.  Asked that she continue with routine monitoring at home 3-4 days per week and let us know if she has any concerns in the future.     Tommy Medal PharmD CPP [facility] [address] [place], [place] [address] [phone]

## 2021-09-15 NOTE — Patient Instructions (Signed)
°  Your blood pressure today is 138/78   Check your blood pressure at home 3-4 days per week and keep record of the readings.  Take your BP meds as follows:  Continue with current medications  Go to the lab in mid-April to mid-May to check cholesterol  Bring all of your meds, your BP cuff and your record of home blood pressures to your next appointment.  Exercise as youre able, try to walk approximately 30 minutes per day.  Keep salt intake to a minimum, especially watch canned and prepared boxed foods.  Eat more fresh fruits and vegetables and fewer canned items.  Avoid eating in fast food restaurants.    HOW TO TAKE YOUR BLOOD PRESSURE: Rest 5 minutes before taking your blood pressure.  Dont smoke or drink caffeinated beverages for at least 30 minutes before. Take your blood pressure before (not after) you eat. Sit comfortably with your back supported and both feet on the floor (dont cross your legs). Elevate your arm to heart level on a table or a desk. Use the proper sized cuff. It should fit smoothly and snugly around your bare upper arm. There should be enough room to slip a fingertip under the cuff. The bottom edge of the cuff should be 1 inch above the crease of the elbow. Ideally, take 3 measurements at one sitting and record the average.

## 2021-09-16 ENCOUNTER — Encounter: Payer: Self-pay | Admitting: Pharmacist Clinician (PhC)/ Clinical Pharmacy Specialist

## 2021-09-16 NOTE — Assessment & Plan Note (Signed)
Patient with essential hypertension, doing well overall on amlodipine 2.5 mg.  Discussed increasing dose to 5 mg, however with her advanced age, do not wish to induce hypotension.  Asked that she continue with routine monitoring at home 3-4 days per week and let us know if she has any concerns in the future.

## 2021-10-03 ENCOUNTER — Encounter: Payer: Self-pay | Admitting: Family Medicine

## 2021-10-03 ENCOUNTER — Ambulatory Visit: Payer: Medicare Other | Admitting: Family Medicine

## 2021-10-03 ENCOUNTER — Other Ambulatory Visit: Payer: Self-pay

## 2021-10-03 DIAGNOSIS — M545 Low back pain, unspecified: Secondary | ICD-10-CM

## 2021-10-03 MED ORDER — GABAPENTIN 300 MG PO CAPS: 300.0000 mg | ORAL_CAPSULE | Freq: Four times a day (QID) | ORAL | Status: DC

## 2021-10-03 MED ORDER — PREDNISONE 10 MG PO TABS: ORAL_TABLET | ORAL | 0 refills | Status: DC

## 2021-10-03 NOTE — Progress Notes (Signed)
This visit occurred during the SARS-CoV-2 public health emergency.  Safety protocols were in place, including screening questions prior to the visit, additional usage of staff PPE, and extensive cleaning of exam room while observing appropriate contact time as indicated for disinfecting solutions.  Prev diarrhea resolved.  She has some days at baseline but she is having to monitor her diet as some foods cause some abd pain.  She is having abd pain episodically but that is better in the meantime.    She has a separate R sided flank pain that wraps around the side of the thorax.  That is separate from the above.  This is intermittent but sore to the touch.  This pain isn't worse with eating.   She has had about 3 episodes overall, including last summer. Her pain doesn't cross the midline and she doesn't have a h/o dermatomal rash.  Prednisone helped prev.  No pain down the legs.    Also with R sided hip pain.  That is better now.  Unclear trigger per patient report.  Hip pain started around the time of the GI sx as above.    No fevers.  No vomiting.  No dysuria now- she had prev UTI treated outside of clinic.  No blood in urine.  No L sided abd pain.    Discussed with patient about previous imaging results.  Prev u/s with IMPRESSION: 1. Cholelithiasis without sonographic findings for acute cholecystitis. 2. Normal caliber common bile duct. 3. Normal sonographic appearance of the liver.  Prev HBS scan with IMPRESSION: Normal hepatic biliary scan.  Normal gallbladder ejection fraction.  H/o CT with stable spondylolisthesis of L5 on S1. 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   Meds, vitals, and allergies reviewed.   ROS: Per HPI unless specifically indicated in ROS section   GEN: nad, alert and oriented HEENT: ncat NECK: supple w/o LA CV: rrr.   PULM: ctab, no inc wob ABD: soft, +bs EXT: no edema SKIN: Well-perfused. Back nontender to palpation in  the midline.    31 minutes were devoted to patient care in this encounter (this includes time spent reviewing the patient's file/history, interviewing and examining the patient, counseling/reviewing plan with patient).

## 2021-10-03 NOTE — Patient Instructions (Addendum)
We can xray your back if the pain is worse.  Try taking baclofen and use heat as needed for back pain.  See if that helps.   If the pain is worse, then start prednisone with food and update me.   Take care.  Glad to see you. Update me as needed.

## 2021-10-08 NOTE — Assessment & Plan Note (Signed)
She has intermittent pain that wraps around the right side of the back but does not cross the midline.  This is in the setting of her having known degenerative changes in the lower thoracic and lumbar spine.  I suspect that she is having intermittent pain flares, potentially radicular in nature, related to degenerative changes in her back.  Discussed options.  Still okay for outpatient follow-up.  We can xray her back if the pain is worse but it would be reasonable to defer that today. I asked her to try taking baclofen and use heat as needed for back pain.  She can see if that helps.   If the pain is worse, then start prednisone with food and update me.  Steroid cautions discussed with patient.  She agrees with plan.

## 2021-10-17 ENCOUNTER — Emergency Department (HOSPITAL_BASED_OUTPATIENT_CLINIC_OR_DEPARTMENT_OTHER): Payer: Medicare Other

## 2021-10-17 ENCOUNTER — Other Ambulatory Visit: Payer: Self-pay

## 2021-10-17 ENCOUNTER — Telehealth: Payer: Self-pay | Admitting: Family Medicine

## 2021-10-17 ENCOUNTER — Encounter (HOSPITAL_BASED_OUTPATIENT_CLINIC_OR_DEPARTMENT_OTHER): Payer: Self-pay

## 2021-10-17 ENCOUNTER — Emergency Department (HOSPITAL_BASED_OUTPATIENT_CLINIC_OR_DEPARTMENT_OTHER)
Admission: EM | Admit: 2021-10-17 | Discharge: 2021-10-18 | Disposition: A | Discharge status: Home / Self Care | Payer: Medicare Other | Attending: Emergency Medicine | Admitting: Emergency Medicine

## 2021-10-17 DIAGNOSIS — R531 Weakness: Secondary | ICD-10-CM | POA: Insufficient documentation

## 2021-10-17 DIAGNOSIS — G319 Degenerative disease of nervous system, unspecified: Secondary | ICD-10-CM | POA: Diagnosis not present

## 2021-10-17 DIAGNOSIS — Z79899 Other long term (current) drug therapy: Secondary | ICD-10-CM | POA: Insufficient documentation

## 2021-10-17 DIAGNOSIS — T50905S Adverse effect of unspecified drugs, medicaments and biological substances, sequela: Secondary | ICD-10-CM

## 2021-10-17 DIAGNOSIS — R1031 Right lower quadrant pain: Secondary | ICD-10-CM | POA: Diagnosis not present

## 2021-10-17 LAB — CBC
HCT: 44.2 % (ref 36.0–46.0)
Hemoglobin: 14.3 g/dL (ref 12.0–15.0)
MCH: 31.8 pg (ref 26.0–34.0)
MCHC: 32.4 g/dL (ref 30.0–36.0)
MCV: 98.4 fL (ref 80.0–100.0)
Platelets: 277 10*3/uL (ref 150–400)
RBC: 4.49 MIL/uL (ref 3.87–5.11)
RDW: 14 % (ref 11.5–15.5)
WBC: 11 10*3/uL — ABNORMAL HIGH (ref 4.0–10.5)
nRBC: 0 % (ref 0.0–0.2)

## 2021-10-17 LAB — BASIC METABOLIC PANEL
Anion gap: 12 (ref 5–15)
BUN: 18 mg/dL (ref 8–23)
CO2: 25 mmol/L (ref 22–32)
Calcium: 9.4 mg/dL (ref 8.9–10.3)
Chloride: 102 mmol/L (ref 98–111)
Creatinine, Ser: 0.74 mg/dL (ref 0.44–1.00)
GFR, Estimated: >60 mL/min (ref >60)
Glucose, Bld: 153 mg/dL — ABNORMAL HIGH (ref 70–99)
Potassium: 3.9 mmol/L (ref 3.5–5.1)
Sodium: 139 mmol/L (ref 135–145)

## 2021-10-17 LAB — URINALYSIS, ROUTINE W REFLEX MICROSCOPIC
Bilirubin Urine: NEGATIVE
Glucose, UA: NEGATIVE mg/dL
Hgb urine dipstick: NEGATIVE
Ketones, ur: NEGATIVE mg/dL
Leukocytes,Ua: NEGATIVE
Nitrite: NEGATIVE
Protein, ur: NEGATIVE mg/dL
Specific Gravity, Urine: 1.028 (ref 1.005–1.030)
pH: 6 (ref 5.0–8.0)

## 2021-10-17 LAB — CBG MONITORING, ED: Glucose-Capillary: 145 mg/dL — ABNORMAL HIGH (ref 70–99)

## 2021-10-17 NOTE — Telephone Encounter (Signed)
Would go ahead and stop it early, ie now.  Did she improve in the meantime?  Please let me know.  Thanks.

## 2021-10-17 NOTE — Telephone Encounter (Signed)
[facility] - Client TELEPHONE ADVICE RECORD AccessNurse Patient Name: Robin Juarez [facility] Gender: Female DOB: 01-14-1931 Age: 88 Y 3 M 28 D Return Phone Number: [phone] (Primary) Address: City/ State/ Zip: [place] [address] Client [facility] - Client Client Site [facility] - Day Provider Raechel Ache - MD Contact Type Call Who Is Calling Patient / Member / Family / Caregiver Call Type Triage / Clinical Relationship To Patient Self Return Phone Number [phone] (Primary) Chief Complaint Medication reaction Reason for Call Symptomatic / Request for Health Information Initial Comment Caller states the patient is on predisone but she is starting to feel shaky and uneasy. She only has two pills left and would like to know if she can just stop taking them. Translation No Nurse Assessment Nurse: Johnna Acosta, RN, Helmut Muster Date/Time (Eastern Time): 10/17/2021 4:14:48 PM Confirm and document reason for call. If symptomatic, describe symptoms. ---Caller states the patient is on Predisone but she is starting to feel shaky and uneasy. Caller states she only has two pills left and would like to know if she can just stop taking them. Caller states the symptoms started yesterday and not feeling good, like she's in a fog. Caller states she feels weak and shaky for most of the day yesterday but during the night she improved. Caller states this morning she started feeling like that again. Does the patient have any new or worsening symptoms? ---Yes Will a triage be completed? ---Yes Related visit to physician within the last 2 weeks? ---Yes Does the PT have any chronic conditions? (i.e. diabetes, asthma, this includes High risk factors for pregnancy, etc.) ---Yes List chronic conditions. ---HTN, depression, high cholesterol Is this a behavioral health or substance abuse call? ---No Guidelines Guideline  Title Affirmed Question Affirmed Notes Nurse Date/Time (Eastern Time) Muscle Jerks - Tics - Shudders [1] Muscle rigidity or tightness AND [2] present now Theodore Demark 10/17/2021 4:18:50 PM PLEASE NOTE: All timestamps contained within this report are represented as [place] Standard Time. CONFIDENTIALTY NOTICE: This fax transmission is intended only for the addressee. It contains information that is legally privileged, confidential or otherwise protected from use or disclosure. If you are not the intended recipient, you are strictly prohibited from reviewing, disclosing, copying using or disseminating any of this information or taking any action in reliance on or regarding this information. If you have received this fax in error, please notify us immediately by telephone so that we can arrange for its return to [place]. Phone: [phone], Toll-Free: [phone], Fax: [phone] Page: 2 of 2 Call Id: [id number] Disp. Time Lamount Cohen Time) Disposition Final User 10/17/2021 4:25:58 PM Go to ED Now (or PCP triage) Yes Johnna Acosta, RN, Melina Fiddler Disagree/Comply Disagree Caller Understands Yes PreDisposition InappropriateToAsk Care Advice Given Per Guideline GO TO ED NOW (OR PCP TRIAGE): * IF NO PCP (PRIMARY CARE PROVIDER) SECOND-LEVEL TRIAGE: You need to be seen within the next hour. Go to the ED/UCC at _____________ Hospital. Leave as soon as you can. CARE ADVICE given per Muscle Jerks - Tics - Shudders (Adult) guideline. Comments User: Quenten Raven, RN Date/Time Lamount Cohen Time): 10/17/2021 4:29:40 PM Spoke with Suzette Battiest on backline and advised her of caller's symptoms and request to stop Prednisone. Suzette Battiest advised that the office will call the caller back. Referrals GO TO FACILITY REFUSE

## 2021-10-17 NOTE — Telephone Encounter (Signed)
Robin Juarez Access Nurse called with pt on line stating that medication Prednisone is  making pt shaky and uneasy. Pt states she has 2 pills left and wanting to know if she can stop taking them. Please advise.

## 2021-10-17 NOTE — ED Triage Notes (Signed)
Patient here POV from Home with Weakness.  Patient endorses taking Prednisone recently for Soreness to Right Lateral Torso. States Pain has been present for years.  Patient states she has almost completed Prednisone and asked PCP to see if she could. PCP stated discontinuing Prednisone is okay but wanted ED Evaluation.   No Fevers. Weakness since yesterday. No N/V/D. No Pain otherwise.  NAD Noted during Triage. A&Ox4. GCS 15. BIB Wheelchair.

## 2021-10-17 NOTE — Telephone Encounter (Addendum)
Pt said that she took a muscle relaxant and feels slightly better. Pt said she still feels uneasy and feels rigid. I advised pt of what Dr Para March said but I also advised if she is feeling uneasy and rigis should be eval. Pt does not want to go to [facility] but will go to [facility] ED. Sending note to Dr Para March and pt will call with update on Tues. Sending note to Dr Para March and Shanda Bumps CMA.

## 2021-10-18 LAB — TROPONIN I (HIGH SENSITIVITY): Troponin I (High Sensitivity): 3 ng/L (ref <18)

## 2021-10-18 NOTE — Telephone Encounter (Signed)
I saw the Er note. Please get update on patient.  Thanks.

## 2021-10-18 NOTE — ED Provider Notes (Signed)
[facility] Provider Note   CSN: [id number] Arrival date & time: 10/17/21  1856     History  Chief Complaint  Patient presents with   Weakness    Robin Juarez is a 88 y.o. female.  The history is provided by the patient.  Illness Location:  Abdomen, and whole body Quality:  Felt rigid after stopping muscle relaxant.  Also felt fatigue after stepping down on steroids and was told to come in by nurse line.  No changes in vision or speech, no weaknes no numbness Severity:  Moderate Onset quality:  Gradual Duration:  1 day Timing:  Constant Progression:  Partially resolved Context:  Stepping down off steroids and off muscle relaxants Relieved by:  Nothing Worsened by:  Nothing Ineffective treatments:  None tried Associated symptoms: no abdominal pain, no chest pain, no congestion, no cough, no diarrhea, no ear pain, no fever, no headaches, no loss of consciousness, no myalgias, no nausea, no rash, no rhinorrhea, no shortness of breath, no sore throat, no vomiting and no wheezing   Patient with chronic abdominal pain sent in by nurse line for feeling stiff and fatigued after stepping down on steroids and stopping muscle relaxants.  No f/c/r.  No CP, no SOB.      Home Medications Prior to Admission medications   Medication Sig Start Date End Date Taking? Authorizing Provider  amLODipine (NORVASC) 2.5 MG tablet Take 1 tablet (2.5 mg total) by mouth daily. 07/11/21 10/09/21  Lorretta Harp, MD  Baclofen 5 MG TABS TAKE 1 TABLET (5 MG) BY MOUTH TWICE DAILY AS NEEDED (FOR MUSCLE SPASMS). 02/18/21   Tonia Ghent, MD  busPIRone (BUSPAR) 10 MG tablet as needed.    [provider]  Cholecalciferol (VITAMIN D) 50 MCG (2000 UT) CAPS Take 2 capsules (4,000 Units total) by mouth daily. 06/10/20   Tonia Ghent, MD  Coenzyme Q10 (CO Q 10 PO) Take 1 tablet by mouth daily.    [provider]  diclofenac sodium (VOLTAREN) 1 % GEL Apply 2  g topically 4 (four) times daily as needed (for joint aches). 07/23/17   Tonia Ghent, MD  DULoxetine (CYMBALTA) 30 MG capsule Take 90 mg by mouth daily.     [provider]  folic acid (FOLVITE) 542 MCG tablet Take 800 mcg by mouth daily.     [provider]  gabapentin (NEURONTIN) 300 MG capsule Take 1 capsule (300 mg total) by mouth in the morning, at noon, in the evening, and at bedtime. 10/03/21   Tonia Ghent, MD  KRILL OIL PO Take 100 mg by mouth daily.    [provider]  lamoTRIgine (LAMICTAL) 100 MG tablet Take 1 tablet (100 mg total) by mouth daily after supper. 05/20/19   Tonia Ghent, MD  metoprolol succinate (TOPROL-XL) 25 MG 24 hr tablet TAKE 1 TABLET BY MOUTH  DAILY 09/09/21   Lorretta Harp, MD  MYRBETRIQ 50 MG TB24 tablet Take 50 mg by mouth daily. 01/18/21   [provider]  OLANZapine (ZYPREXA) 2.5 MG tablet Take 1 tablet (2.5 mg total) by mouth at bedtime. 08/05/15   Tonia Ghent, MD  ondansetron (ZOFRAN-ODT) 4 MG disintegrating tablet Take 1 tablet (4 mg total) by mouth every 8 (eight) hours as needed. 09/03/21   Long, Wonda Olds, MD  predniSONE (DELTASONE) 10 MG tablet Take 2 a day for 5 days, then 1 a day for 5 days, with food. Don't take with  aleve/ibuprofen. 10/03/21   Tonia Ghent, MD  Red Yeast Rice Extract (RED YEAST RICE PO) Take by mouth. Patient not taking: Reported on 10/03/2021    [provider]      Allergies    Digoxin, Nitrofurantoin, Alendronate sodium, Cephalosporins, Chlordiazepoxide hcl, Cyclobenzaprine, Lisinopril, Phenobarbital-belladonna alk, Praluent [alirocumab], Repatha [evolocumab], Ritalin [methylphenidate hcl], Statins, Trimethoprim, Chlordiazepoxide, Clavulanic acid, Omeprazole, Oxcarbazepine, Sulfa antibiotics, Tetracycline, and Zetia [ezetimibe]    Review of Systems   Review of Systems  Constitutional:  Negative for fever.  HENT:  Negative for congestion, ear pain, rhinorrhea and sore  throat.   Eyes:  Negative for redness.  Respiratory:  Negative for cough, shortness of breath and wheezing.   Cardiovascular:  Negative for chest pain.  Gastrointestinal:  Negative for abdominal pain, diarrhea, nausea and vomiting.  Genitourinary:  Negative for difficulty urinating.  Musculoskeletal:  Negative for back pain and myalgias.  Skin:  Negative for rash.  Neurological:  Negative for loss of consciousness and headaches.  Psychiatric/Behavioral:  Negative for agitation.   All other systems reviewed and are negative.  Physical Exam Updated Vital Signs BP (!) 118/53    Pulse 63    Temp 98.8 F (37.1 C)    Resp 16    Ht _0  (1.549 m)    Wt 80.3 kg    SpO2 95%    BMI 33.45 kg/m  Physical Exam Vitals and nursing note reviewed.  Constitutional:      General: She is not in acute distress.    Appearance: Normal appearance.  HENT:     Head: Normocephalic and atraumatic.     Nose: Nose normal.     Mouth/Throat:     Mouth: Mucous membranes are moist.     Pharynx: Oropharynx is clear.  Eyes:     Extraocular Movements: Extraocular movements intact.     Conjunctiva/sclera: Conjunctivae normal.     Pupils: Pupils are equal, round, and reactive to light.  Cardiovascular:     Rate and Rhythm: Normal rate and regular rhythm.     Pulses: Normal pulses.     Heart sounds: Normal heart sounds.  Pulmonary:     Effort: Pulmonary effort is normal.     Breath sounds: Normal breath sounds.  Abdominal:     General: Abdomen is flat. Bowel sounds are normal.     Palpations: Abdomen is soft.     Tenderness: There is no abdominal tenderness. There is no guarding.  Musculoskeletal:        General: Normal range of motion.     Cervical back: Normal range of motion and neck supple.  Skin:    General: Skin is warm and dry.     Capillary Refill: Capillary refill takes less than 2 seconds.  Neurological:     General: No focal deficit present.     Mental Status: She is alert and oriented to  person, place, and time.     Cranial Nerves: No cranial nerve deficit.     Sensory: No sensory deficit.     Motor: No weakness.     Coordination: Coordination normal.     Gait: Gait normal.     Deep Tendon Reflexes: Reflexes normal.  Psychiatric:        Mood and Affect: Mood normal.        Behavior: Behavior normal.    ED Results / Procedures / Treatments   Labs (all labs ordered are listed, but only abnormal results are displayed) Results for orders placed  or performed during the hospital encounter of 24/58/09  Basic metabolic panel  Result Value Ref Range   Sodium 139 135 - 145 mmol/L   Potassium 3.9 3.5 - 5.1 mmol/L   Chloride 102 98 - 111 mmol/L   CO2 25 22 - 32 mmol/L   Glucose, Bld 153 (H) 70 - 99 mg/dL   BUN 18 8 - 23 mg/dL   Creatinine, Ser 0.74 0.44 - 1.00 mg/dL   Calcium 9.4 8.9 - 10.3 mg/dL   GFR, Estimated >60 >60 mL/min   Anion gap 12 5 - 15  CBC  Result Value Ref Range   WBC 11.0 (H) 4.0 - 10.5 K/uL   RBC 4.49 3.87 - 5.11 MIL/uL   Hemoglobin 14.3 12.0 - 15.0 g/dL   HCT 44.2 36.0 - 46.0 %   MCV 98.4 80.0 - 100.0 fL   MCH 31.8 26.0 - 34.0 pg   MCHC 32.4 30.0 - 36.0 g/dL   RDW 14.0 11.5 - 15.5 %   Platelets 277 150 - 400 K/uL   nRBC 0.0 0.0 - 0.2 %  Urinalysis, Routine w reflex microscopic  Result Value Ref Range   Color, Urine YELLOW YELLOW   APPearance CLEAR CLEAR   Specific Gravity, Urine 1.028 1.005 - 1.030   pH 6.0 5.0 - 8.0   Glucose, UA NEGATIVE NEGATIVE mg/dL   Hgb urine dipstick NEGATIVE NEGATIVE   Bilirubin Urine NEGATIVE NEGATIVE   Ketones, ur NEGATIVE NEGATIVE mg/dL   Protein, ur NEGATIVE NEGATIVE mg/dL   Nitrite NEGATIVE NEGATIVE   Leukocytes,Ua NEGATIVE NEGATIVE  CBG monitoring, ED  Result Value Ref Range   Glucose-Capillary 145 (H) 70 - 99 mg/dL  Troponin I (High Sensitivity)  Result Value Ref Range   Troponin I (High Sensitivity) 3 <18 ng/L   CT Head Wo Contrast  Result Date: 10/17/2021 CLINICAL DATA:  Weakness.  Polytrauma,  blunt EXAM: CT HEAD WITHOUT CONTRAST TECHNIQUE: Contiguous axial images were obtained from the base of the skull through the vertex without intravenous contrast. RADIATION DOSE REDUCTION: This exam was performed according to the departmental dose-optimization program which includes automated exposure control, adjustment of the mA and/or kV according to patient size and/or use of iterative reconstruction technique. COMPARISON:  01/02/2020 FINDINGS: Brain: There is atrophy and chronic small vessel disease changes. No acute intracranial abnormality. Specifically, no hemorrhage, hydrocephalus, mass lesion, acute infarction, or significant intracranial injury. Vascular: No hyperdense vessel or unexpected calcification. Skull: No acute calvarial abnormality. Sinuses/Orbits: No acute findings Other: None IMPRESSION: Atrophy, chronic microvascular disease. No acute intracranial abnormality. Electronically Signed   By: Rolm Baptise M.D.   On: 10/17/2021 23:43   DG Chest Portable 1 View  Result Date: 10/17/2021 CLINICAL DATA:  Weakness. EXAM: PORTABLE CHEST 1 VIEW COMPARISON:  Chest radiograph dated 03/18/2020. FINDINGS: No focal consolidation, pleural effusion, pneumothorax. The cardiac silhouette is within limits. Atherosclerotic calcification of the aorta. No acute osseous pathology. IMPRESSION: No active cardiopulmonary disease. Electronically Signed   By: Anner Crete M.D.   On: 10/17/2021 23:20   CT Renal Stone Study  Result Date: 10/17/2021 CLINICAL DATA:  Right flank pain EXAM: CT ABDOMEN AND PELVIS WITHOUT CONTRAST TECHNIQUE: Multidetector CT imaging of the abdomen and pelvis was performed following the standard protocol without IV contrast. RADIATION DOSE REDUCTION: This exam was performed according to the departmental dose-optimization program which includes automated exposure control, adjustment of the mA and/or kV according to patient size and/or use of iterative reconstruction technique. COMPARISON:   01/31/2021 FINDINGS: Lower  chest: No acute abnormality. Small gallstone within the gallbladder. No biliary ductal dilatation. No focal hepatic abnormality. Hepatobiliary: Pancreas: No focal abnormality or ductal dilatation. Spleen: No focal abnormality.  Normal size. Adrenals/Urinary Tract: Low-density lesion in the midpole of the left kidney measures 13 mm, difficult to characterize on this noncontrast study but unchanged since prior study and likely cyst. Adrenal glands unremarkable. No renal or ureteral stones. No hydronephrosis. Urinary bladder decompressed, grossly unremarkable. Stomach/Bowel: Scattered colonic diverticulosis, most pronounced in the sigmoid colon. No diverticulitis. Moderate stool burden throughout the colon. Stomach and small bowel decompressed. Vascular/Lymphatic: Aortoiliac atherosclerosis. No evidence of aneurysm or adenopathy. Reproductive: Prior hysterectomy.  No adnexal masses. Other: No free fluid or free air. Musculoskeletal: No acute bony abnormality. Degenerative changes in the lumbar spine. Postoperative changes at the lumbosacral junction. 8 mm of anterolisthesis of L5 on S1. IMPRESSION: No renal or ureteral stones.  No hydronephrosis. Aortoiliac atherosclerosis. Colonic diverticulosis. Cholelithiasis. Electronically Signed   By: Rolm Baptise M.D.   On: 10/17/2021 23:46    EKG  EKG Interpretation  Date/Time:  Monday October 17 2021 19:19:40 EST Ventricular Rate:  86 PR Interval:  132 QRS Duration: 88 QT Interval:  370 QTC Calculation: 442 R Axis:   2 Text Interpretation: Normal sinus rhythm with sinus arrhythmia Confirmed by Dory Horn) on 10/18/2021 12:52:39 AM         Radiology CT Head Wo Contrast  Result Date: 10/17/2021 CLINICAL DATA:  Weakness.  Polytrauma, blunt EXAM: CT HEAD WITHOUT CONTRAST TECHNIQUE: Contiguous axial images were obtained from the base of the skull through the vertex without intravenous contrast. RADIATION DOSE REDUCTION:  This exam was performed according to the departmental dose-optimization program which includes automated exposure control, adjustment of the mA and/or kV according to patient size and/or use of iterative reconstruction technique. COMPARISON:  01/02/2020 FINDINGS: Brain: There is atrophy and chronic small vessel disease changes. No acute intracranial abnormality. Specifically, no hemorrhage, hydrocephalus, mass lesion, acute infarction, or significant intracranial injury. Vascular: No hyperdense vessel or unexpected calcification. Skull: No acute calvarial abnormality. Sinuses/Orbits: No acute findings Other: None IMPRESSION: Atrophy, chronic microvascular disease. No acute intracranial abnormality. Electronically Signed   By: Rolm Baptise M.D.   On: 10/17/2021 23:43   DG Chest Portable 1 View  Result Date: 10/17/2021 CLINICAL DATA:  Weakness. EXAM: PORTABLE CHEST 1 VIEW COMPARISON:  Chest radiograph dated 03/18/2020. FINDINGS: No focal consolidation, pleural effusion, pneumothorax. The cardiac silhouette is within limits. Atherosclerotic calcification of the aorta. No acute osseous pathology. IMPRESSION: No active cardiopulmonary disease. Electronically Signed   By: Anner Crete M.D.   On: 10/17/2021 23:20   CT Renal Stone Study  Result Date: 10/17/2021 CLINICAL DATA:  Right flank pain EXAM: CT ABDOMEN AND PELVIS WITHOUT CONTRAST TECHNIQUE: Multidetector CT imaging of the abdomen and pelvis was performed following the standard protocol without IV contrast. RADIATION DOSE REDUCTION: This exam was performed according to the departmental dose-optimization program which includes automated exposure control, adjustment of the mA and/or kV according to patient size and/or use of iterative reconstruction technique. COMPARISON:  01/31/2021 FINDINGS: Lower chest: No acute abnormality. Small gallstone within the gallbladder. No biliary ductal dilatation. No focal hepatic abnormality. Hepatobiliary: Pancreas: No  focal abnormality or ductal dilatation. Spleen: No focal abnormality.  Normal size. Adrenals/Urinary Tract: Low-density lesion in the midpole of the left kidney measures 13 mm, difficult to characterize on this noncontrast study but unchanged since prior study and likely cyst. Adrenal glands unremarkable. No renal or ureteral  stones. No hydronephrosis. Urinary bladder decompressed, grossly unremarkable. Stomach/Bowel: Scattered colonic diverticulosis, most pronounced in the sigmoid colon. No diverticulitis. Moderate stool burden throughout the colon. Stomach and small bowel decompressed. Vascular/Lymphatic: Aortoiliac atherosclerosis. No evidence of aneurysm or adenopathy. Reproductive: Prior hysterectomy.  No adnexal masses. Other: No free fluid or free air. Musculoskeletal: No acute bony abnormality. Degenerative changes in the lumbar spine. Postoperative changes at the lumbosacral junction. 8 mm of anterolisthesis of L5 on S1. IMPRESSION: No renal or ureteral stones.  No hydronephrosis. Aortoiliac atherosclerosis. Colonic diverticulosis. Cholelithiasis. Electronically Signed   By: Rolm Baptise M.D.   On: 10/17/2021 23:46    Procedures Procedures    Medications Ordered in ED Medications - No data to display  ED Course/ Medical Decision Making/ A&P                           Medical Decision Making Patient with stiffness and fatigue following stopping robaxin and stepping down on steroids.    Amount and/or Complexity of Data Reviewed Independent Historian: spouse    Details: see above External Data Reviewed: notes. Labs: ordered.    Details: All labs reviewed by me, troponin is negative at 3, normal electrolytes, white count slightly elevated at 11 likely secondary to steroids, negative covid and flu Radiology: ordered.    Details: Reviewed by me: negative cxr, negative head CT and negative abdominal CT ECG/medicine tests: ordered and independent interpretation performed.  Risk Decision  regarding hospitalization. Risk Details: I considered admission by exam and vitals and labs and imaging are benign and reassuring.  Patient has not had a stroke, no signs of ACS and abdomen is negative on exam and on CT.   I suspect this is the effect of stepping down on steroids and stopping muscle relaxants.  Follow up with your PMD for ongoing care.      Final Clinical Impression(s) / ED Diagnoses Final diagnoses:  None   Return for intractable cough, coughing up blood, fevers > 100.4 unrelieved by medication, shortness of breath, intractable vomiting, chest pain, shortness of breath, weakness, numbness, changes in speech, facial asymmetry, abdominal pain, passing out, Inability to tolerate liquids or food, cough, altered mental status or any concerns. No signs of systemic illness or infection. The patient is nontoxic-appearing on exam and vital signs are within normal limits. I have reviewed the triage vital signs and the nursing notes. Pertinent labs & imaging results that were available during my care of the patient were reviewed by me and considered in my medical decision making (see chart for details). After history, exam, and medical workup I feel the patient has been appropriately medically screened and is safe for discharge home. Pertinent diagnoses were discussed with the patient. Patient was given return precautions. Rx / DC Orders ED Discharge Orders     None         Netty Sullivant, MD 10/18/21 [id number]

## 2021-10-18 NOTE — ED Notes (Signed)
EMT-P provided AVS using Teachback Method. Patient verbalizes understanding of Discharge Instructions. Opportunity for Questioning and Answers were provided by EMT-P. Patient Discharged from ED.  ? ?

## 2021-10-19 NOTE — Telephone Encounter (Signed)
Spoke to patient and she is doing much better. Patient states they believe it was the lower dose of prednisone that made her feel that way. I advised patient if she needs anything else to let us know. She appreciated the call.  ?

## 2021-10-19 NOTE — Telephone Encounter (Signed)
Noted.  Thanks.  Glad to hear.  ? ?

## 2021-10-26 ENCOUNTER — Telehealth: Payer: Self-pay | Admitting: Cardiovascular Disease

## 2021-10-26 NOTE — Telephone Encounter (Signed)
STAT if HR is under 50 or over 120 ?(normal HR is 60-100 beats per minute) ? ?What is your heart rate? 121/83 hr 98; 152/69 hr 100; 139/74 hr 102 ? ?Do you have a log of your heart rate readings (document readings)?   ? ?Do you have any other symptoms? Heart was skipping beat.  ? ? ? ? ?

## 2021-10-26 NOTE — Telephone Encounter (Signed)
Returned call to patient who states that her heart rate has been elevated for the last 2 days. Patient reports that her HR was been around 98-102 since Monday, patient states that she has not been consuming more caffeine than normal. Patient states that on her BP cuff her Heart rates does sound irregular. Patient states that she feels well, patient denies any lightheadedness, shortness of breath, chest pain or any other symptoms. Patient will also cut down caffeine intake. Scheduled patient an appointment to see Azalee Course, PA-C next Thursday 11/03/21 at 245pm. Made patient aware of ED precautions should new or worsening symptoms develop. Patient verbalized understanding.  ? ?Will forward to MD for review as well.  ?

## 2021-11-03 ENCOUNTER — Ambulatory Visit: Payer: Medicare Other | Admitting: Physician Assistant

## 2021-11-03 ENCOUNTER — Encounter: Payer: Self-pay | Admitting: Physician Assistant

## 2021-11-03 ENCOUNTER — Other Ambulatory Visit: Payer: Self-pay

## 2021-11-03 ENCOUNTER — Ambulatory Visit: Payer: Medicare Other

## 2021-11-03 VITALS — BP 130/80 | HR 87 | Ht 61.0 in | Wt 182.0 lb

## 2021-11-03 DIAGNOSIS — R011 Cardiac murmur, unspecified: Secondary | ICD-10-CM | POA: Diagnosis not present

## 2021-11-03 DIAGNOSIS — I2510 Atherosclerotic heart disease of native coronary artery without angina pectoris: Secondary | ICD-10-CM

## 2021-11-03 DIAGNOSIS — Z8673 Personal history of transient ischemic attack (TIA), and cerebral infarction without residual deficits: Secondary | ICD-10-CM

## 2021-11-03 DIAGNOSIS — R002 Palpitations: Secondary | ICD-10-CM | POA: Diagnosis not present

## 2021-11-03 DIAGNOSIS — E785 Hyperlipidemia, unspecified: Secondary | ICD-10-CM

## 2021-11-03 DIAGNOSIS — I10 Essential (primary) hypertension: Secondary | ICD-10-CM

## 2021-11-03 NOTE — Progress Notes (Unsigned)
Enrolled for [employer] to mail a ZIO XT long term holter monitor to the patients address on file.  

## 2021-11-03 NOTE — Patient Instructions (Addendum)
Medication Instructions:  ?TAKE Metoprolol Succinate (Toprol-XL) in the mornings  ?TAKE Amlodipine at lunch time ? ?*If you need a refill on your cardiac medications before your next appointment, please call your pharmacy* ? ?Lab Work: ?NONE ordered at this time of appointment  ? ?If you have labs (blood work) drawn today and your tests are completely normal, you will receive your results only by: ?MyChart Message (if you have MyChart) OR ?A paper copy in the mail ?If you have any lab test that is abnormal or we need to change your treatment, we will call you to review the results. ? ?Testing/Procedures: ?Your physician has requested that you have an echocardiogram. Echocardiography is a painless test that uses sound waves to create images of your heart. It provides your doctor with information about the size and shape of your heart and how well your heart?s chambers and valves are working. This procedure takes approximately one hour. There are no restrictions for this procedure. ? ?Please schedule for 1-2 weeks at [facility] office  ? ?ZIO XT- Long Term Monitor Instructions ? ?Your physician has requested you wear a ZIO patch monitor for 3 days.  ?This is a single patch monitor. [employer] supplies one patch monitor per enrollment. Additional ?stickers are not available. Please do not apply patch if you will be having a Nuclear Stress Test,  ?Echocardiogram, Cardiac CT, MRI, or Chest Xray during the period you would be wearing the  ?monitor. The patch cannot be worn during these tests. You cannot remove and re-apply the  ?ZIO XT patch monitor.  ?Your ZIO patch monitor will be mailed 3 day USPS to your address on file. It may take 3-5 days  ?to receive your monitor after you have been enrolled.  ?Once you have received your monitor, please review the enclosed instructions. Your monitor  ?has already been registered assigning a specific monitor serial # to you. ? ?Billing and Patient Assistance Program Information ? ?We  have supplied [employer] with any of your insurance information on file for billing purposes. ?[employer] offers a sliding scale Patient Assistance Program for patients that do not have  ?insurance, or whose insurance does not completely cover the cost of the ZIO monitor.  ?You must apply for the Patient Assistance Program to qualify for this discounted rate.  ?To apply, please call [employer] at [phone], select option 4, select option 2, ask to apply for  ?Patient Assistance Program. Meredeth Ide will ask your household income, and how many people  ?are in your household. They will quote your out-of-pocket cost based on that information.  ?[employer] will also be able to set up a 63-month, interest-free payment plan if needed. ? ?Applying the monitor ?  ?Shave hair from upper left chest.  ?Hold abrader disc by orange tab. Rub abrader in 40 strokes over the upper left chest as  ?indicated in your monitor instructions.  ?Clean area with 4 enclosed alcohol pads. Let dry.  ?Apply patch as indicated in monitor instructions. Patch will be placed under collarbone on left  ?side of chest with arrow pointing upward.  ?Rub patch adhesive wings for 2 minutes. Remove white label marked "1". Remove the white  ?label marked "2". Rub patch adhesive wings for 2 additional minutes.  ?While looking in a mirror, press and release button in center of patch. A small green light will  ?flash 3-4 times. This will be your only indicator that the monitor has been turned on.  ?Do not shower for the first 24 hours.  You may shower after the first 24 hours.  ?Press the button if you feel a symptom. You will hear a small click. Record Date, Time and  ?Symptom in the Patient Logbook.  ?When you are ready to remove the patch, follow instructions on the last 2 pages of Patient  ?Logbook. Stick patch monitor onto the last page of Patient Logbook.  ?Place Patient Logbook in the blue and white box. Use locking tab on box and tape box closed  ?securely. The blue  and white box has prepaid postage on it. Please place it in the mailbox as  ?soon as possible. Your physician should have your test results approximately 7 days after the  ?monitor has been mailed back to [facility].  ?Call [facility] at [phone] if you have questions regarding  ?your ZIO XT patch monitor. Call them immediately if you see an orange light blinking on your  ?monitor.  ?If your monitor falls off in less than 4 days, contact our Monitor department at [phone].  ?If your monitor becomes loose or falls off after 4 days call [employer] at [phone] for  ?suggestions on securing your monitor ? ?Follow-Up: ?At [facility], you and your health needs are our priority.  As part of our continuing mission to provide you with exceptional heart care, we have created designated Provider Care Teams.  These Care Teams include your primary Cardiologist (physician) and Advanced Practice Providers (APPs -  Physician Assistants and Nurse Practitioners) who all work together to provide you with the care you need, when you need it. ? ?We recommend signing up for the patient portal called "MyChart".  Sign up information is provided on this After Visit Summary.  MyChart is used to connect with patients for Virtual Visits (Telemedicine).  Patients are able to view lab/test results, encounter notes, upcoming appointments, etc.  Non-urgent messages can be sent to your provider as well.   ?To learn more about what you can do with MyChart, go to [email or web address].   ? ?Your next appointment:   ?2 month(s) ? ?The format for your next appointment:   ?In Person ? ?Provider:   ?Azalee Course, PA-C      ? ?Other Instructions ? ? ?

## 2021-11-03 NOTE — Progress Notes (Signed)
?Cardiology Office Note:   ? ?Date:  11/04/2021  ? ?ID:  Robin Juarez, DOB [birth year], MRN [id number] ? ?PCP:  Tonia Ghent, MD ?  ?[facility] Providers ?Cardiologist:  Quay Burow, MD    ? ?Referring MD: Tonia Ghent, MD  ? ?Chief Complaint  ?Patient presents with  ? Follow-up  ?  Seen for Dr. Gwenlyn Found  ? ? ?History of Present Illness:   ? ?Robin Juarez is a 88 y.o. female with a hx of CAD, fibromyalgia, hypertension, hyperlipidemia, and a history of CVA.  She has significant family history of CAD with her brother having bypass surgery.  Myoview in 2016 was abnormal.  She ultimately underwent cardiac catheterization on 09/03/2014 that showed 50% mid LAD stenosis, otherwise normal coronary arteries.  Her chest pain was felt to be noncardiac.  She was readmitted in October 2018 with chest pain.  Echocardiogram showed normal EF with grade 1 DD.  Coronary CT obtained on 06/14/2017 demonstrated 25 to 50% proximal LAD, greater than 70% mid LAD lesion, 25 to 50% left circumflex lesion.  This was followed by repeat cardiac catheterization study on 06/15/2017 that showed 20% ostial LAD, 40% proximal LAD, 50% ostial D1, 30% ostial to proximal LAD, 20% proximal to mid left circumflex lesion.  No culprit lesion was identified.  Medical therapy was recommended.  Patient was last seen by Dr. Gwenlyn Found in February 2022 at which time she was doing well.  More recently, she was seen at the hypertension clinic for blood pressure and cholesterol control.  Unfortunately she is intolerant of multiple medications.  During the most recent follow-up on 09/15/2021, amlodipine was increased to 5 mg daily.  More recently, she got over a stomach virus. ? ?She called cardiology service on 10/26/2021 complaining of elevated heart rate.  She presents today for evaluation of elevated heart rate.  She denies any chest pain, shortness of breath or dizziness.  She says she typically notices her heart rate being elevated on the blood  pressure machine in the morning, her blood pressure machine also shows that her heart was skipping beats.  She denies any feeling of passing out.  I recommended a 3-day heart monitor.  On physical exam, she also has a significant murmur, I recommended repeat echocardiogram.  Last echocardiogram was back in 2018.  It appears her heart rate is typically elevated early in the morning after she woke up.  She says she takes the amlodipine in the morning and take the metoprolol succinate around lunchtime.  I asked her to switch the timing of the 2 medication around and start taking the metoprolol succinate early in the morning to help control her heart rate.  She should check her blood pressure and heart rate roughly 2 hours after she takes the metoprolol.  Otherwise, she can follow-up in 2 months. ? ?Past Medical History:  ?Diagnosis Date  ? Arthritis   ? knee and hip OA- prev injection by Dr. Nelva Bush  ? Blood transfusion without reported diagnosis   ? 37 yrs ago -s/p back surgery  ? Chest pain   ? Coronary artery disease   ? 50% mid LAD by cath January 2016  ? Depression 1993, 2000  ? Fibromyalgia   ? GERD (gastroesophageal reflux disease)   ? H/O hiatal hernia   ? Hyperlipidemia   ? Hypertension   ? OSA (obstructive sleep apnea)   ? off tx as of 2020, no sx as of 2020  ? Osteoporosis   ?  Positive cardiac stress test   ? Stroke [facility])   ? Urinary incontinence   ? UTI (lower urinary tract infection)   ? ? ?Past Surgical History:  ?Procedure Laterality Date  ? ABDOMINAL HYSTERECTOMY  1976  ? APPENDECTOMY  1964  ? Back fusions  1977  ? missing vertebra  ? BACK SURGERY    ? fusions lumbar area  ? BREAST BIOPSY  1995  ? x 2  ? BREAST BIOPSY  07/10/2012  ? Procedure: BREAST BIOPSY WITH NEEDLE LOCALIZATION;  Surgeon: Rolm Bookbinder, MD;  Location: [place];  Service: General;  Laterality: Right;  ? CARDIOVASCULAR STRESS TEST  2009  ? Normal, Dr. Radford Pax  ? CATARACT EXTRACTION, BILATERAL Bilateral 2005  ?  Childbirth  [phone]  ? CYSTOSCOPY N/A 11/25/2012  ? Procedure: CYSTOSCOPY;  Surgeon: Reece Packer, MD;  Location: [facility];  Service: Urology;  Laterality: N/A;  ? dilatation and currettage  1964  ? LEFT HEART CATH AND CORONARY ANGIOGRAPHY N/A 06/15/2017  ? Procedure: LEFT HEART CATH AND CORONARY ANGIOGRAPHY;  Surgeon: Troy Sine, MD;  Location: [place] [facility];  Service: Cardiovascular;  Laterality: N/A;  ? LEFT HEART CATHETERIZATION WITH CORONARY ANGIOGRAM N/A 09/03/2014  ? Procedure: LEFT HEART CATHETERIZATION WITH CORONARY ANGIOGRAM;  Surgeon: Lorretta Harp, MD;  Location: [facility] C[facility];  Service: Cardiovascular;  Laterality: N/A;  ? LUMBAR LAMINECTOMY/DECOMPRESSION MICRODISCECTOMY N/A 05/01/2013  ? Procedure: COMPLETE DECOMPRESSIVE LUMBAR LAMINECTOMY L3-L4 MICRODISCECTOMY L3-L4 ON THE LEFT;  Surgeon: Tobi Bastos, MD;  Location: [facility];  Service: Orthopedics;  Laterality: N/A;  ? OVARIAN CYST REMOVAL  1964  ? PUBOVAGINAL SLING N/A 11/25/2012  ? Procedure: Gaynelle Arabian;  Surgeon: Reece Packer, MD;  Location: [facility];  Service: Urology;  Laterality: N/A;  ? ? ?Current Medications: ?Current Meds  ?Medication Sig  ? amLODipine (NORVASC) 2.5 MG tablet Take 1 tablet (2.5 mg total) by mouth daily.  ? Baclofen 5 MG TABS TAKE 1 TABLET (5 MG) BY MOUTH TWICE DAILY AS NEEDED (FOR MUSCLE SPASMS).  ? busPIRone (BUSPAR) 10 MG tablet as needed.  ? Cholecalciferol (VITAMIN D) 50 MCG (2000 UT) CAPS Take 2 capsules (4,000 Units total) by mouth daily.  ? Coenzyme Q10 (CO Q 10 PO) Take 1 tablet by mouth daily.  ? DULoxetine (CYMBALTA) 30 MG capsule Take 90 mg by mouth daily.   ? folic acid (FOLVITE) 244 MCG tablet Take 800 mcg by mouth daily.   ? gabapentin (NEURONTIN) 300 MG capsule Take 1 capsule (300 mg total) by mouth in the morning, at noon, in the evening, and at bedtime. (Patient taking differently: Take 1,200 mg by mouth 3 (three) times daily. 1 tab  noon, 1 tab evening, 2 tabs bedtime (4 tabs total))  ? KRILL OIL PO Take 100 mg by mouth daily.  ? lamoTRIgine (LAMICTAL) 100 MG tablet Take 1 tablet (100 mg total) by mouth daily after supper.  ? metoprolol succinate (TOPROL-XL) 25 MG 24 hr tablet TAKE 1 TABLET BY MOUTH  DAILY  ? OLANZapine (ZYPREXA) 2.5 MG tablet Take 1 tablet (2.5 mg total) by mouth at bedtime.  ? Red Yeast Rice Extract (RED YEAST RICE PO) Take by mouth.  ?  ? ?Allergies:   Digoxin, Nitrofurantoin, Alendronate sodium, Cephalosporins, Chlordiazepoxide hcl, Cyclobenzaprine, Lisinopril, Phenobarbital-belladonna alk, Praluent [alirocumab], Repatha [evolocumab], Ritalin [methylphenidate hcl], Statins, Trimethoprim, Chlordiazepoxide, Clavulanic acid, Omeprazole, Oxcarbazepine, Sulfa antibiotics, Tetracycline, and Zetia [ezetimibe]  ? ?Social History  ? ?Socioeconomic History  ?  Marital status: Married  ?  Spouse name: Not on file  ? Number of children: 4  ? Years of education: Not on file  ? Highest education level: Not on file  ?Occupational History  ? Occupation: RETIRED  ?  Employer: RETIRED  ?Tobacco Use  ? Smoking status: Never  ? Smokeless tobacco: Never  ? Tobacco comments:  ?  a small amount when 88 yo  ?Vaping Use  ? Vaping Use: Never used  ?Substance and Sexual Activity  ? Alcohol use: No  ?  Alcohol/week: 0.0 standard drinks  ? Drug use: No  ? Sexual activity: Not Currently  ?Other Topics Concern  ? Not on file  ?Social History Narrative  ? Married 1953, husband is well.  ? 5 pregnancies, 4 live births, all well (5th was unexpect pregnancy and patient needed to terminate due to concurrent medical problems at the time)  ? 11 grandchildren.  9 great grandchildren  ? Regular exercise:  No  ? ?Social Determinants of Health  ? ?Financial Resource Strain: Not on file  ?Food Insecurity: Not on file  ?Transportation Needs: Not on file  ?Physical Activity: Not on file  ?Stress: Not on file  ?Social Connections: Not on file  ?  ? ?Family History: ?The  patient's family history includes Aneurysm in her sister; Arthritis in her mother; Dementia in her sister; Heart disease in her brother; Hypertension in her father and sister; Kidney disease in her maternal

## 2021-11-04 ENCOUNTER — Encounter: Payer: Self-pay | Admitting: Physician Assistant

## 2021-11-14 NOTE — Progress Notes (Signed)
? ?I, Robin Juarez, LAT, ATC, am serving as scribe for Dr. [name]. ? ?Subjective:   ? ?CC: Bilat shoulder pain ? ?HPI: Pt is an 88 y/o female c/o bilat shoulder pain x one month w/ no known MOI. Pt locates pain to her B ant shoulder w/ radiating pain into her B upper arm. ? ?Neck pain: no ?Radiates: yes into her B upper arm ?Mechanical symptoms: no ?Aggravates: laying on her side; pushing up off her R arm; pulling motions ?Treatments tried: R shoulder injection 2 months ago at Dr Ermelinda Das office; Advil; Tylenol; Lidocaine cream ? ?She does not drive. ? ?Pertinent review of Systems: No fevers or chills ? ?Relevant historical information: Osteoporosis.  Hypertension. ? ? ?Objective:   ? ?Vitals:  ? 11/15/21 1315  ?BP: 120/80  ?Pulse: 71  ?SpO2: 94%  ? ?General: Well Developed, well nourished, and in no acute distress.  ? ?MSK:  ?Right shoulder: Normal appearing ?Decreased range of motion.  Abduction 130 degrees.  External rotation 30 degrees beyond the neutral rotation.  Internal rotation lumbar spine. ?Strength: 4/5 abduction external and internal rotation. ?Mildly positive Hawkins and Neer's test. ?Mildly positive Yergason's and speeds test. ? ? ?Left shoulder: Decreased range of motion ?Abduction 130 degrees.  External rotation 30 degrees beyond the neutral position.  Rotation to the lumbar spine. ?Strength 4/5 abduction external and internal rotation. ?Mildly positive Hawkins and Neer's test and mildly positive Yergason's and speeds test. ? ? ?Lab and Radiology Results ? ?Procedure: Real-time Ultrasound Guided Injection of right shoulder glenohumeral joint posterior approach ?Device: Philips Affiniti 50G ?Images permanently stored and available for review in PACS ?Ultrasound evaluation prior to injection shows no retracted full-thickness rotator cuff tear.  Long head of the biceps tendon is difficult to visualize question tear. ?Verbal informed consent obtained.  Discussed risks and benefits of procedure.  Warned about infection bleeding damage to structures skin hypopigmentation and fat atrophy among others. ?Patient expresses understanding and agreement ?Time-out conducted.   ?Noted no overlying erythema, induration, or other signs of local infection.   ?Skin prepped in a sterile fashion.   ?Local anesthesia: Topical Ethyl chloride.   ?With sterile technique and under real time ultrasound guidance: 40 mg of Depo-Medrol and 2 mL of Marcaine injected into the glenohumeral joint. Fluid seen entering the joint capsule.   ?Completed without difficulty   ?Pain immediately resolved suggesting accurate placement of the medication.   ?Advised to call if fevers/chills, erythema, induration, drainage, or persistent bleeding.   ?Images permanently stored and available for review in the ultrasound unit.  ?Impression: Technically successful ultrasound guided injection. ? ? ? ?Procedure: Real-time Ultrasound Guided Injection of left shoulder glenohumeral joint posterior approach ?Device: Philips Affiniti 50G ?Images permanently stored and available for review in PACS ?Ultrasound evaluation prior to injection shows no retracted full-thickness rotator cuff tear. ?Verbal informed consent obtained.  Discussed risks and benefits of procedure. Warned about infection bleeding damage to structures skin hypopigmentation and fat atrophy among others. ?Patient expresses understanding and agreement ?Time-out conducted.   ?Noted no overlying erythema, induration, or other signs of local infection.   ?Skin prepped in a sterile fashion.   ?Local anesthesia: Topical Ethyl chloride.   ?With sterile technique and under real time ultrasound guidance: 40 mg of Depo-Medrol and 2 mL of Marcaine injected into glenohumeral joint. Fluid seen entering the joint capsule.   ?Completed without difficulty   ?Pain immediately resolved suggesting accurate placement of the medication.   ?Advised to call if fevers/chills,  erythema, induration, drainage, or  persistent bleeding.   ?Images permanently stored and available for review in the ultrasound unit.  ?Impression: Technically successful ultrasound guided injection. ? ?X-ray images bilateral shoulders obtained today personally independently interpreted. ? ?Right shoulder: Low riding humeral head.  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 ?Moderate AC DJD.  No acute fractures. ? ?Left shoulder: Moderate to severe glenohumeral DJD.  Moderate AC DJD. ? ?Await formal radiology review ? ? ?Impression and Recommendations:   ? ?Assessment and Plan: ?88 y.o. female with bilateral shoulder pain thought to be due to DJD based on her x-ray.  Plan for bilateral glenohumeral injection today as well as physical therapy.  She does not drive so we will use home health physical therapy.  Recheck in 6 weeks.. ? ?PDMP not reviewed this encounter. ?Orders Placed This Encounter  ?Procedures  ? [place] LIMITED JOINT SPACE STRUCTURES UP BILAT(NO LINKED CHARGES)  ?  Order Specific Question:   Reason for Exam (SYMPTOM  OR DIAGNOSIS REQUIRED)  ?  Answer:   B shoulder pain  ?  Order Specific Question:   Preferred imaging location?  ?  Answer:   [facility]  ? DG Shoulder Left  ?  Standing Status:   Future  ?  Standing Expiration Date:   12/16/2021  ?  Order Specific Question:   Reason for Exam (SYMPTOM  OR DIAGNOSIS REQUIRED)  ?  Answer:   L shoulder pain  ?  Order Specific Question:   Preferred imaging location?  ?  Answer:   [name]  ? DG Shoulder Right  ?  Standing Status:   Future  ?  Standing Expiration Date:   12/16/2021  ?  Order Specific Question:   Reason for Exam (SYMPTOM  OR DIAGNOSIS REQUIRED)  ?  Answer:   R shoulder pain  ?  Order Specific Question:   Preferred imaging location?  ?  Answer:   [name]  ? Ambulatory referral to Home Health  ?  Referral Priority:   Routine  ?  Referral Type:   Home Health Care  ?  Referral Reason:   Specialty Services Required  ?  Requested Specialty:    Home Health Services  ?  Number of Visits Requested:   1  ? ?No orders of the defined types were placed in this encounter. ? ? ?Discussed warning signs or symptoms. Please see discharge instructions. Patient expresses understanding. ? ? ?The above documentation has been reviewed and is accurate and complete [name], M.D. ? ?

## 2021-11-15 ENCOUNTER — Encounter: Payer: Self-pay | Admitting: Family Medicine

## 2021-11-15 ENCOUNTER — Other Ambulatory Visit: Payer: Self-pay

## 2021-11-15 ENCOUNTER — Ambulatory Visit (INDEPENDENT_AMBULATORY_CARE_PROVIDER_SITE_OTHER): Payer: Medicare Other

## 2021-11-15 ENCOUNTER — Ambulatory Visit: Payer: Medicare Other | Admitting: Family Medicine

## 2021-11-15 ENCOUNTER — Ambulatory Visit: Payer: Self-pay

## 2021-11-15 VITALS — BP 120/80 | HR 71 | Ht 61.0 in | Wt 183.6 lb

## 2021-11-15 DIAGNOSIS — M25511 Pain in right shoulder: Secondary | ICD-10-CM | POA: Diagnosis not present

## 2021-11-15 DIAGNOSIS — M25512 Pain in left shoulder: Secondary | ICD-10-CM

## 2021-11-15 DIAGNOSIS — M19012 Primary osteoarthritis, left shoulder: Secondary | ICD-10-CM | POA: Insufficient documentation

## 2021-11-15 DIAGNOSIS — M19011 Primary osteoarthritis, right shoulder: Secondary | ICD-10-CM | POA: Insufficient documentation

## 2021-11-15 NOTE — Patient Instructions (Addendum)
Nice to meet you today. ? ?You had B shoulder injections.  Call or go to the ER if you develop a large red swollen joint with extreme pain or oozing puss.  ? ?I've referred you to Home Health PT.  They will call you to schedule but please let us know if you haven't heard from them in one week regarding scheduling. ? ?Please get an Xray today before you leave. ? ?Follow-up: 6 weeks ? ?

## 2021-11-16 ENCOUNTER — Telehealth: Payer: Self-pay | Admitting: Family Medicine

## 2021-11-16 NOTE — Telephone Encounter (Signed)
Pt's husband dropped off paperwork for Dr. Damita Dunnings to sign/fill out. It is for disability parking. I will put it in his box!  ?

## 2021-11-16 NOTE — Telephone Encounter (Signed)
Form placed in Dr. Josefine Class inbox to sign when he returns to clinic ?

## 2021-11-16 NOTE — Telephone Encounter (Signed)
I will work on the hardcopy.  Thanks. 

## 2021-11-17 ENCOUNTER — Ambulatory Visit (INDEPENDENT_AMBULATORY_CARE_PROVIDER_SITE_OTHER): Payer: Medicare Other

## 2021-11-17 DIAGNOSIS — R011 Cardiac murmur, unspecified: Secondary | ICD-10-CM

## 2021-11-17 LAB — ECHOCARDIOGRAM COMPLETE
AR max vel: 1.52 cm2
AV Area VTI: 1.55 cm2
AV Area mean vel: 1.44 cm2
AV Mean grad: 8 mmHg
AV Peak grad: 13.1 mmHg
Ao pk vel: 1.81 m/s
Area-P 1/2: 2.93 cm2
Calc EF: 64.8 %
S' Lateral: 2.34 cm
Single Plane A2C EF: 71.9 %
Single Plane A4C EF: 54.6 %

## 2021-11-17 NOTE — Progress Notes (Signed)
Right shoulder xray shows some spurs and arthritis changes in the shoulder joint.

## 2021-11-17 NOTE — Progress Notes (Signed)
Left shoulder x-ray shows some arthritis changes.

## 2021-11-23 ENCOUNTER — Telehealth: Payer: Self-pay

## 2021-11-23 NOTE — Telephone Encounter (Addendum)
Called and left a voice message asking patient to give our office a call back for her recent Echocardiogram results. Will try calling again. ? ?----- Message from Azalee Course, [place] sent at 11/22/2021  4:44 PM EDT ----- ?Normal pumping function of heart, mild aortic valve stenosis which is not unexpected given age and should not cause any symptom. This mild valve issue likely responsible for the heart murmur I heard on physical exam, however it does not need to be fixed.  ?

## 2021-12-13 ENCOUNTER — Encounter: Payer: Self-pay | Admitting: Family Medicine

## 2021-12-13 ENCOUNTER — Ambulatory Visit (INDEPENDENT_AMBULATORY_CARE_PROVIDER_SITE_OTHER): Payer: Medicare Other | Admitting: Family Medicine

## 2021-12-13 VITALS — BP 130/86 | HR 79 | Ht 61.0 in | Wt 183.6 lb

## 2021-12-13 DIAGNOSIS — M25512 Pain in left shoulder: Secondary | ICD-10-CM

## 2021-12-13 DIAGNOSIS — M19011 Primary osteoarthritis, right shoulder: Secondary | ICD-10-CM

## 2021-12-13 DIAGNOSIS — M25511 Pain in right shoulder: Secondary | ICD-10-CM | POA: Diagnosis not present

## 2021-12-13 DIAGNOSIS — G8929 Other chronic pain: Secondary | ICD-10-CM | POA: Diagnosis not present

## 2021-12-13 DIAGNOSIS — M19012 Primary osteoarthritis, left shoulder: Secondary | ICD-10-CM | POA: Diagnosis not present

## 2021-12-13 NOTE — Patient Instructions (Addendum)
Good to see you today. ? ?Follow-up: about 1 week before your party on June 17th (around June 12?) ? ?Take tylenol arthritis 2 pills before bedtime.  ? ? ?

## 2021-12-13 NOTE — Progress Notes (Signed)
? ?I, Robin Juarez, LAT, ATC acting as a scribe for Robin Graham, MD. ? ?ARNETTE Juarez is a 88 y.o. female who presents to [facility] at [facility] today for f/u bilat shoulder pain thought to be due to DJD based on her x-ray. Pt does not drive. She was last seen by Dr. Denyse Juarez on 11/15/21 and was given bilat GH steroid injections and was referred for home health PT. Today, pt reports that her pain has returned.  She got some relief from the shoulder injections for about 2 weeks but the pain has returned.  She never from home health PT. ? ?Dx imaging: 11/15/21 R & L shoulder XR ? ?Pertinent review of systems: No fevers or chills ? ?Relevant historical information: Hypertension.  History of CVA.  Osteoporosis. ?She has her 72th wedding anniversary party on June 17. ? ? ?Exam:  ?BP 130/86 (BP Location: Right Arm, Patient Position: Sitting, Cuff Size: Normal)   Pulse 79   Ht 5\' 1"  (1.549 m)   Wt 183 lb 9.6 oz (83.3 kg)   SpO2 93%   BMI 34.69 kg/m?  ?General: Well Developed, well nourished, and in no acute distress.  ? ?MSK: Bilateral shoulders decreased range of motion. ? ? ? ?Lab and Radiology Results ?EXAM: ?LEFT SHOULDER - 2+ VIEW ?  ?COMPARISON:  None. ?  ?FINDINGS: ?No recent fracture or dislocation is seen. Degenerative changes are ?noted with bony spurs in the shoulder joint. No abnormal soft tissue ?calcifications are seen. ?  ?IMPRESSION: ?No recent fracture is seen in the left shoulder. Degenerative ?changes are noted with bony spurs in the left shoulder joint, ?especially in the inferior aspect. ?  ?  ?Electronically Signed ?  By: M.D. ?  On: 11/16/2021 20:33 ? ?EXAM: ?RIGHT SHOULDER - 2+ VIEW ?  ?COMPARISON:  None. ?  ?FINDINGS: ?No recent fracture or dislocation is seen. There are no abnormal ?soft tissue calcifications. Degenerative changes are noted with bony ?spurs in the inferior aspect of glenohumeral joint. ?  ?IMPRESSION: ?No recent fracture is seen.  There are no abnormal soft tissue ?calcifications. Degenerative changes are noted with bony spurs ?especially in the inferior aspect of right glenohumeral joint. ?  ?  ?Electronically Signed ?  By: 11/18/2021 M.D. ?  On: 11/16/2021 20:37 ?I, 11/18/2021, personally (independently) visualized and performed the interpretation of the images attached in this note. ? ? ? ? ? ?Assessment and Plan: ?88 y.o. 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  Unfortunately glenohumeral injection did not provide very lasting benefit. ?At the last visit about a month ago she was referred to home health physical therapy and over the last month our referral coordinator has tried 4 different home health services and is struggling to find one that will accept her insurance and is currently available. ? ?Plan now to keep searching for home health physical therapy and use [facility] [address] in [place] as a [place].  She does not drive and uses her husband for transportation.  He is 25 years old and does not drive much either.  So home health physical therapy is the best option if available. ? ?I am not optimistic that further injections are going to be long-lasting but we could use the right before her 72th anniversary party as that would provide at least temporary relief. ? ?Plan for PT and if not better surgical  consultation to discuss total shoulder replacement may be an option.  She is not a great surgical candidate but that is probably her best orthopedic option. ? ? ? ?Discussed warning signs or symptoms. Please see discharge instructions. Patient expresses understanding. ? ? ?The above documentation has been reviewed and is accurate and complete Robin Juarez, M.D. ? ? ?

## 2021-12-27 ENCOUNTER — Ambulatory Visit: Payer: Medicare Other | Admitting: Family Medicine

## 2021-12-28 ENCOUNTER — Ambulatory Visit (INDEPENDENT_AMBULATORY_CARE_PROVIDER_SITE_OTHER): Payer: Medicare Other

## 2021-12-28 VITALS — Wt 183.0 lb

## 2021-12-28 DIAGNOSIS — Z0000 Encounter for general adult medical examination without abnormal findings: Secondary | ICD-10-CM

## 2021-12-28 NOTE — Patient Instructions (Signed)
[name] , ?Thank you for taking time to come for your Medicare Wellness Visit. I appreciate your ongoing commitment to your health goals. Please review the following plan we discussed and let me know if I can assist you in the future.  ? ?Screening recommendations/referrals: ?Colonoscopy: aged out ?Mammogram: aged out ?Bone Density: aged out  ?Recommended yearly ophthalmology/optometry visit for glaucoma screening and checkup ?Recommended yearly dental visit for hygiene and checkup ? ?Vaccinations: ?Influenza vaccine: 06/07/21 ?Pneumococcal vaccine: 11/17/14 ?Tdap vaccine: 04/21/14 ?Shingles vaccine: Shingrix 05/01/19, 07/29/19  Zostavax 08/22/07   ?Covid-19:11/03/19, 11/26/19, 06/24/20, 05/02/21 ? ?Advanced directives: no ? ?Conditions/risks identified: none ? ?Next appointment: Follow up in one year for your annual wellness visit 01/01/23 @ 1:15pm by phone ? ? ?Preventive Care 9 Years and Older, Female ?Preventive care refers to lifestyle choices and visits with your health care provider that can promote health and wellness. ?What does preventive care include? ?A yearly physical exam. This is also called an annual well check. ?Dental exams once or twice a year. ?Routine eye exams. Ask your health care provider how often you should have your eyes checked. ?Personal lifestyle choices, including: ?Daily care of your teeth and gums. ?Regular physical activity. ?Eating a healthy diet. ?Avoiding tobacco and drug use. ?Limiting alcohol use. ?Practicing safe sex. ?Taking low-dose aspirin every day. ?Taking vitamin and mineral supplements as recommended by your health care provider. ?What happens during an annual well check? ?The services and screenings done by your health care provider during your annual well check will depend on your age, overall health, lifestyle risk factors, and family history of disease. ?Counseling  ?Your health care provider may ask you questions about your: ?Alcohol use. ?Tobacco use. ?Drug  use. ?Emotional well-being. ?Home and relationship well-being. ?Sexual activity. ?Eating habits. ?History of falls. ?Memory and ability to understand (cognition). ?Work and work Statistician. ?Reproductive health. ?Screening  ?You may have the following tests or measurements: ?Height, weight, and BMI. ?Blood pressure. ?Lipid and cholesterol levels. These may be checked every 5 years, or more frequently if you are over 73 years old. ?Skin check. ?Lung cancer screening. You may have this screening every year starting at age 74 if you have a 30-pack-year history of smoking and currently smoke or have quit within the past 15 years. ?Fecal occult blood test (FOBT) of the stool. You may have this test every year starting at age 40. ?Flexible sigmoidoscopy or colonoscopy. You may have a sigmoidoscopy every 5 years or a colonoscopy every 10 years starting at age 57. ?Hepatitis C blood test. ?Hepatitis B blood test. ?Sexually transmitted disease (STD) testing. ?Diabetes screening. This is done by checking your blood sugar (glucose) after you have not eaten for a while (fasting). You may have this done every 1-3 years. ?Bone density scan. This is done to screen for osteoporosis. You may have this done starting at age 56. ?Mammogram. This may be done every 1-2 years. Talk to your health care provider about how often you should have regular mammograms. ?Talk with your health care provider about your test results, treatment options, and if necessary, the need for more tests. ?Vaccines  ?Your health care provider may recommend certain vaccines, such as: ?Influenza vaccine. This is recommended every year. ?Tetanus, diphtheria, and acellular pertussis (Tdap, Td) vaccine. You may need a Td booster every 10 years. ?Zoster vaccine. You may need this after age 74. ?Pneumococcal 13-valent conjugate (PCV13) vaccine. One dose is recommended after age 27. ?Pneumococcal polysaccharide (PPSV23) vaccine. One dose is  recommended after age  75. ?Talk to your health care provider about which screenings and vaccines you need and how often you need them. ?This information is not intended to replace advice given to you by your health care provider. Make sure you discuss any questions you have with your health care provider. ?Document Released: 09/03/2015 Document Revised: 04/26/2016 Document Reviewed: 06/08/2015 ?Elsevier Interactive Patient Education ? 2017 [place]. ? ?Fall Prevention in the Home ?Falls can cause injuries. They can happen to people of all ages. There are many things you can do to make your home safe and to help prevent falls. ?What can I do on the outside of my home? ?Regularly fix the edges of walkways and driveways and fix any cracks. ?Remove anything that might make you trip as you walk through a door, such as a raised step or threshold. ?Trim any bushes or trees on the path to your home. ?Use bright outdoor lighting. ?Clear any walking paths of anything that might make someone trip, such as rocks or tools. ?Regularly check to see if handrails are loose or broken. Make sure that both sides of any steps have handrails. ?Any raised decks and porches should have guardrails on the edges. ?Have any leaves, snow, or ice cleared regularly. ?Use sand or salt on walking paths during winter. ?Clean up any spills in your garage right away. This includes oil or grease spills. ?What can I do in the bathroom? ?Use night lights. ?Install grab bars by the toilet and in the tub and shower. Do not use towel bars as grab bars. ?Use non-skid mats or decals in the tub or shower. ?If you need to sit down in the shower, use a plastic, non-slip stool. ?Keep the floor dry. Clean up any water that spills on the floor as soon as it happens. ?Remove soap buildup in the tub or shower regularly. ?Attach bath mats securely with double-sided non-slip rug tape. ?Do not have throw rugs and other things on the floor that can make you trip. ?What can I do in the  bedroom? ?Use night lights. ?Make sure that you have a light by your bed that is easy to reach. ?Do not use any sheets or blankets that are too big for your bed. They should not hang down onto the floor. ?Have a firm chair that has side arms. You can use this for support while you get dressed. ?Do not have throw rugs and other things on the floor that can make you trip. ?What can I do in the kitchen? ?Clean up any spills right away. ?Avoid walking on wet floors. ?Keep items that you use a lot in easy-to-reach places. ?If you need to reach something above you, use a strong step stool that has a grab bar. ?Keep electrical cords out of the way. ?Do not use floor polish or wax that makes floors slippery. If you must use wax, use non-skid floor wax. ?Do not have throw rugs and other things on the floor that can make you trip. ?What can I do with my stairs? ?Do not leave any items on the stairs. ?Make sure that there are handrails on both sides of the stairs and use them. Fix handrails that are broken or loose. Make sure that handrails are as long as the stairways. ?Check any carpeting to make sure that it is firmly attached to the stairs. Fix any carpet that is loose or worn. ?Avoid having throw rugs at the top or bottom of the stairs. If  you do have throw rugs, attach them to the floor with carpet tape. ?Make sure that you have a light switch at the top of the stairs and the bottom of the stairs. If you do not have them, ask someone to add them for you. ?What else can I do to help prevent falls? ?Wear shoes that: ?Do not have high heels. ?Have rubber bottoms. ?Are comfortable and fit you well. ?Are closed at the toe. Do not wear sandals. ?If you use a stepladder: ?Make sure that it is fully opened. Do not climb a closed stepladder. ?Make sure that both sides of the stepladder are locked into place. ?Ask someone to hold it for you, if possible. ?Clearly mark and make sure that you can see: ?Any grab bars or  handrails. ?First and last steps. ?Where the edge of each step is. ?Use tools that help you move around (mobility aids) if they are needed. These include: ?Canes. ?Walkers. ?Scooters. ?Crutches. ?Turn on the lights when you go into a

## 2021-12-28 NOTE — Progress Notes (Signed)
?Virtual Visit via Telephone Note ? ?I connected with  Robin Juarez on 12/28/21 at  1:00 PM EDT by telephone and verified that I am speaking with the correct person using two identifiers. ? ?Location: ?Patient: home ?Provider: [facility] ?Persons participating in the virtual visit: patient/Nurse Health Advisor ?  ?I discussed the limitations, risks, security and privacy concerns of performing an evaluation and management service by telephone and the availability of in person appointments. The patient expressed understanding and agreed to proceed. ? ?Interactive audio and video telecommunications were attempted between this nurse and patient, however failed, due to patient having technical difficulties OR patient did not have access to video capability.  We continued and completed visit with audio only. ? ?Some vital signs may be absent or patient reported.  ? ?Dionisio David, LPN  ?Subjective:  ? Robin Juarez is a 88 y.o. female who presents for Medicare Annual (Subsequent) preventive examination. ? ?Review of Systems    ? ?  ? ?   ?Objective:  ?  ?There were no vitals filed for this visit. ?There is no height or weight on file to calculate BMI. ? ? ?  10/17/2021  ?  7:17 PM 09/05/2021  ?  7:14 PM 09/03/2021  ?  5:24 AM 01/31/2021  ?  4:34 AM 05/18/2020  ?  3:36 PM 08/05/2019  ? 10:18 AM 09/04/2018  ?  1:30 AM  ?Advanced Directives  ?Does Patient Have a Medical Advance Directive? No Yes Yes Yes Yes No Yes  ?Type of Corporate treasurer of [place];Living will [place];Living will [place];Living will  Living will  ?Does patient want to make changes to medical advance directive?  No - Patient declined  No - Patient declined   No - Patient declined  ?Copy of [place] in Chart?  No - copy requested No - copy requested Yes - validated most recent copy scanned in chart (See row information) No - copy  requested    ?Would patient like information on creating a medical advance directive? No - Patient declined     No - Patient declined   ? ? ?Current Medications (verified) ?Outpatient Encounter Medications as of 12/28/2021  ?Medication Sig  ? amLODipine (NORVASC) 2.5 MG tablet Take 1 tablet (2.5 mg total) by mouth daily.  ? Baclofen 5 MG TABS TAKE 1 TABLET (5 MG) BY MOUTH TWICE DAILY AS NEEDED (FOR MUSCLE SPASMS).  ? busPIRone (BUSPAR) 10 MG tablet as needed.  ? Cholecalciferol (VITAMIN D) 50 MCG (2000 UT) CAPS Take 2 capsules (4,000 Units total) by mouth daily.  ? Coenzyme Q10 (CO Q 10 PO) Take 1 tablet by mouth daily.  ? DULoxetine (CYMBALTA) 30 MG capsule Take 90 mg by mouth daily.   ? folic acid (FOLVITE) 277 MCG tablet Take 800 mcg by mouth daily.   ? gabapentin (NEURONTIN) 300 MG capsule Take 1 capsule (300 mg total) by mouth in the morning, at noon, in the evening, and at bedtime. (Patient taking differently: Take 1,200 mg by mouth 3 (three) times daily. 1 tab noon, 1 tab evening, 2 tabs bedtime (4 tabs total))  ? KRILL OIL PO Take 100 mg by mouth daily.  ? lamoTRIgine (LAMICTAL) 100 MG tablet Take 1 tablet (100 mg total) by mouth daily after supper.  ? metoprolol succinate (TOPROL-XL) 25 MG 24 hr tablet TAKE 1 TABLET BY MOUTH  DAILY  ? OLANZapine (ZYPREXA)  2.5 MG tablet Take 1 tablet (2.5 mg total) by mouth at bedtime.  ? Red Yeast Rice Extract (RED YEAST RICE PO) Take by mouth.  ? ?No facility-administered encounter medications on file as of 12/28/2021.  ? ? ?Allergies (verified) ?Digoxin, Nitrofurantoin, Alendronate sodium, Cephalosporins, Chlordiazepoxide hcl, Cyclobenzaprine, Lisinopril, Phenobarbital-belladonna alk, Praluent [alirocumab], Repatha [evolocumab], Ritalin [methylphenidate hcl], Statins, Trimethoprim, Chlordiazepoxide, Clavulanic acid, Omeprazole, Oxcarbazepine, Sulfa antibiotics, Tetracycline, and Zetia [ezetimibe]  ? ?History: ?Past Medical History:  ?Diagnosis Date  ? Arthritis   ? knee and  hip OA- prev injection by Dr. Nelva Bush  ? Blood transfusion without reported diagnosis   ? 37 yrs ago -s/p back surgery  ? Chest pain   ? Coronary artery disease   ? 50% mid LAD by cath January 2016  ? Depression 1993, 2000  ? Fibromyalgia   ? GERD (gastroesophageal reflux disease)   ? H/O hiatal hernia   ? Hyperlipidemia   ? Hypertension   ? OSA (obstructive sleep apnea)   ? off tx as of 2020, no sx as of 2020  ? Osteoporosis   ? Positive cardiac stress test   ? Stroke [facility])   ? Urinary incontinence   ? UTI (lower urinary tract infection)   ? ?Past Surgical History:  ?Procedure Laterality Date  ? ABDOMINAL HYSTERECTOMY  1976  ? APPENDECTOMY  1964  ? Back fusions  1977  ? missing vertebra  ? BACK SURGERY    ? fusions lumbar area  ? BREAST BIOPSY  1995  ? x 2  ? BREAST BIOPSY  07/10/2012  ? Procedure: BREAST BIOPSY WITH NEEDLE LOCALIZATION;  Surgeon: Rolm Bookbinder, MD;  Location: [facility];  Service: General;  Laterality: Right;  ? CARDIOVASCULAR STRESS TEST  2009  ? Normal, Dr. Radford Pax  ? CATARACT EXTRACTION, BILATERAL Bilateral 2005  ? Childbirth  [phone]  ? CYSTOSCOPY N/A 11/25/2012  ? Procedure: CYSTOSCOPY;  Surgeon: Reece Packer, MD;  Location: [facility];  Service: Urology;  Laterality: N/A;  ? dilatation and currettage  1964  ? LEFT HEART CATH AND CORONARY ANGIOGRAPHY N/A 06/15/2017  ? Procedure: LEFT HEART CATH AND CORONARY ANGIOGRAPHY;  Surgeon: Troy Sine, MD;  Location: [facility];  Service: Cardiovascular;  Laterality: N/A;  ? LEFT HEART CATHETERIZATION WITH CORONARY ANGIOGRAM N/A 09/03/2014  ? Procedure: LEFT HEART CATHETERIZATION WITH CORONARY ANGIOGRAM;  Surgeon: Lorretta Harp, MD;  Location: [facility] CATH LAB;  Service: Cardiovascular;  Laterality: N/A;  ? LUMBAR LAMINECTOMY/DECOMPRESSION MICRODISCECTOMY N/A 05/01/2013  ? Procedure: COMPLETE DECOMPRESSIVE LUMBAR LAMINECTOMY L3-L4 MICRODISCECTOMY L3-L4 ON THE LEFT;  Surgeon: Tobi Bastos, MD;   Location: [facility];  Service: Orthopedics;  Laterality: N/A;  ? OVARIAN CYST REMOVAL  1964  ? PUBOVAGINAL SLING N/A 11/25/2012  ? Procedure: Gaynelle Arabian;  Surgeon: Reece Packer, MD;  Location: [facility];  Service: Urology;  Laterality: N/A;  ? ?Family History  ?Problem Relation Age of Onset  ? Arthritis Mother   ? Stroke Father   ? Hypertension Father   ? Heart disease Brother   ?     Died during CABG  ? Hypertension Sister   ? Dementia Sister   ? Aneurysm Sister   ?     Brain  ? Parkinsonism Sister   ? Kidney disease Maternal Grandmother   ? Colon cancer Neg Hx   ? Breast cancer Neg Hx   ? ?Social History  ? ?Socioeconomic History  ? Marital status: Married  ?  Spouse name: Not on file  ? Number of children: 4  ? Years of education: Not on file  ? Highest education level: Not on file  ?Occupational History  ? Occupation: RETIRED  ?  Employer: RETIRED  ?Tobacco Use  ? Smoking status: Never  ? Smokeless tobacco: Never  ? Tobacco comments:  ?  a small amount when 88 yo  ?Vaping Use  ? Vaping Use: Never used  ?Substance and Sexual Activity  ? Alcohol use: No  ?  Alcohol/week: 0.0 standard drinks  ? Drug use: No  ? Sexual activity: Not Currently  ?Other Topics Concern  ? Not on file  ?Social History Narrative  ? Married 1953, husband is well.  ? 5 pregnancies, 4 live births, all well (5th was unexpect pregnancy and patient needed to terminate due to concurrent medical problems at the time)  ? 11 grandchildren.  9 great grandchildren  ? Regular exercise:  No  ? ?Social Determinants of Health  ? ?Financial Resource Strain: Not on file  ?Food Insecurity: Not on file  ?Transportation Needs: Not on file  ?Physical Activity: Not on file  ?Stress: Not on file  ?Social Connections: Not on file  ? ? ?Tobacco Counseling ?Counseling given: Not Answered ?Tobacco comments: a small amount when 88 yo ? ? ?Clinical Intake: ? ?Pre-visit preparation completed: Yes ? ?Pain : No/denies pain ? ?  ? ?Nutritional  Risks: None ?Diabetes: No ? ?How often do you need to have someone help you when you read instructions, pamphlets, or other written materials from your doctor or pharmacy?: 1 - Never ? ?Diabetic?no ? ?Inter

## 2022-01-04 LAB — LIPID PANEL
Chol/HDL Ratio: 3.2 ratio (ref 0.0–4.4)
Cholesterol, Total: 211 mg/dL — ABNORMAL HIGH (ref 100–199)
HDL: 65 mg/dL (ref >39)
LDL Chol Calc (NIH): 121 mg/dL — ABNORMAL HIGH (ref 0–99)
Triglycerides: 142 mg/dL (ref 0–149)
VLDL Cholesterol Cal: 25 mg/dL (ref 5–40)

## 2022-01-09 ENCOUNTER — Other Ambulatory Visit: Payer: Self-pay

## 2022-01-09 MED ORDER — BACLOFEN 5 MG PO TABS
ORAL_TABLET | ORAL | 0 refills | Status: DC
Start: 2022-01-09 — End: 2022-05-28

## 2022-01-10 ENCOUNTER — Ambulatory Visit: Payer: Medicare Other | Admitting: Family Medicine

## 2022-01-10 VITALS — BP 130/78 | HR 74 | Temp 97.5°F | Ht 61.0 in | Wt 183.1 lb

## 2022-01-10 DIAGNOSIS — M25511 Pain in right shoulder: Secondary | ICD-10-CM

## 2022-01-10 DIAGNOSIS — R2681 Unsteadiness on feet: Secondary | ICD-10-CM

## 2022-01-10 DIAGNOSIS — I10 Essential (primary) hypertension: Secondary | ICD-10-CM

## 2022-01-10 DIAGNOSIS — G8929 Other chronic pain: Secondary | ICD-10-CM

## 2022-01-10 DIAGNOSIS — R251 Tremor, unspecified: Secondary | ICD-10-CM | POA: Diagnosis not present

## 2022-01-10 DIAGNOSIS — M1990 Unspecified osteoarthritis, unspecified site: Secondary | ICD-10-CM

## 2022-01-10 DIAGNOSIS — M25512 Pain in left shoulder: Secondary | ICD-10-CM

## 2022-01-10 DIAGNOSIS — I878 Other specified disorders of veins: Secondary | ICD-10-CM

## 2022-01-10 NOTE — Progress Notes (Signed)
Subjective:     Robin Juarez is a 88 y.o. female presenting for Skin Discoloration (Around lower legs x 1 week. Denies any itching or pain )     HPI  #Rash on the legs - started 1 week ago - looking better today - left leg - with dark red patch initially - treated with neosporin with some improvement - right let was bright red - has had some patches in the leg for a long time - would gradually go away on its own - no itching - no burning/stinging - no fever/chills  Told she needs shoulder surgery due to arthritis Significant pain with this  Taking tylenol arthritis 2 at nighttime  Review of Systems  Constitutional:  Negative for chills and fever.  Respiratory:  Negative for shortness of breath.   Cardiovascular:  Negative for chest pain.  Gastrointestinal:  Negative for diarrhea, nausea and vomiting.    Social History   Tobacco Use  Smoking Status Never  Smokeless Tobacco Never  Tobacco Comments   a small amount when 88 yo        Objective:    BP Readings from Last 3 Encounters:  01/10/22 130/78  12/13/21 130/86  11/15/21 120/80   Wt Readings from Last 3 Encounters:  01/10/22 183 lb 2 oz (83.1 kg)  12/28/21 183 lb (83 kg)  12/13/21 183 lb 9.6 oz (83.3 kg)    BP 130/78   Pulse 74   Temp (!) 97.5 F (36.4 C) (Temporal)   Ht 5\' 1"  (1.549 m)   Wt 183 lb 2 oz (83.1 kg)   SpO2 95%   BMI 34.60 kg/m    Physical Exam Constitutional:      General: She is not in acute distress.    Appearance: She is well-developed. She is not diaphoretic.  HENT:     Right Ear: External ear normal.     Left Ear: External ear normal.     Nose: Nose normal.  Eyes:     Conjunctiva/sclera: Conjunctivae normal.  Cardiovascular:     Rate and Rhythm: Normal rate and regular rhythm.     Pulses:          Dorsalis pedis pulses are 2+ on the right side and 2+ on the left side.       Posterior tibial pulses are 2+ on the right side and 2+ on the left side.   Pulmonary:     Effort: Pulmonary effort is normal. No respiratory distress.     Breath sounds: Normal breath sounds. No wheezing.  Musculoskeletal:     Cervical back: Neck supple.     Comments: Bilateral slight ankle edema  Skin:    General: Skin is warm and dry.     Capillary Refill: Capillary refill takes less than 2 seconds.     Comments: Blanching faint erythematous macular rash with telangiosis appearance. Some decreased erythema with elevation of the leg   Neurological:     Mental Status: She is alert. Mental status is at baseline.  Psychiatric:        Mood and Affect: Mood normal.        Behavior: Behavior normal.          Assessment & Plan:   Problem List Items Addressed This Visit       Cardiovascular and Mediastinum   Essential hypertension    BP controled. Does have some ankle swelling - not particularly bothersome. Likely 2/2 to amlodipine. Cont at this time. Cont  metoprolol       Venous stasis of both lower extremities - Primary    Advised elevation daily as well as exercise. As unstable on feet - will get PT referral to help with stability and get exercise started. Appears to be early and no dermatitis so no need for anti-itch or lotion at this time.        Relevant Orders   Ambulatory referral to Home Health     Musculoskeletal and Integument   Osteoarthritis    Pt with generalized arthritis and noting worsening stability and balance. This impacts her ability to leave the house for appointments. Discussed PT to work on balance training and will do HH due to difficulty leaving the house       Relevant Orders   Ambulatory referral to Home Health     Other   Tremor    Minimal impact to daily function. Cont to monitor.        Relevant Orders   Ambulatory referral to Home Health   Chronic pain of both shoulders    Following with ortho. Told she needs surgery, but does not want this. Referral to [facility] to help with strengthening       Relevant  Orders   Ambulatory referral to Home Health   Gait instability    HH PT work on strengthening and balance       Relevant Orders   Ambulatory referral to Home Health     Return if symptoms worsen or fail to improve.  Lynnda Child, MD

## 2022-01-10 NOTE — Patient Instructions (Signed)
Elevate your legs above the heart for 30 minutes a day  Home health physical therapy to work on balance and get you to be able to safely exercise daily

## 2022-01-10 NOTE — Assessment & Plan Note (Signed)
BP controled. Does have some ankle swelling - not particularly bothersome. Likely 2/2 to amlodipine. Cont at this time. Cont metoprolol

## 2022-01-10 NOTE — Assessment & Plan Note (Signed)
HH PT work on Electrical engineer

## 2022-01-10 NOTE — Assessment & Plan Note (Signed)
Pt with generalized arthritis and noting worsening stability and balance. This impacts her ability to leave the house for appointments. Discussed PT to work on balance training and will do [place] due to difficulty leaving the house

## 2022-01-10 NOTE — Assessment & Plan Note (Signed)
Advised elevation daily as well as exercise. As unstable on feet - will get PT referral to help with stability and get exercise started. Appears to be early and no dermatitis so no need for anti-itch or lotion at this time.

## 2022-01-10 NOTE — Assessment & Plan Note (Signed)
Following with ortho. Told she needs surgery, but does not want this. Referral to [facility] to help with strengthening

## 2022-01-10 NOTE — Assessment & Plan Note (Signed)
Minimal impact to daily function. Cont to monitor.

## 2022-01-11 ENCOUNTER — Encounter: Payer: Self-pay | Admitting: Physician Assistant

## 2022-01-11 ENCOUNTER — Ambulatory Visit: Payer: Medicare Other | Admitting: Physician Assistant

## 2022-01-11 ENCOUNTER — Telehealth: Payer: Self-pay

## 2022-01-11 VITALS — BP 130/70 | HR 77 | Ht 60.0 in | Wt 183.1 lb

## 2022-01-11 DIAGNOSIS — I10 Essential (primary) hypertension: Secondary | ICD-10-CM | POA: Diagnosis not present

## 2022-01-11 DIAGNOSIS — E785 Hyperlipidemia, unspecified: Secondary | ICD-10-CM

## 2022-01-11 DIAGNOSIS — R002 Palpitations: Secondary | ICD-10-CM | POA: Diagnosis not present

## 2022-01-11 DIAGNOSIS — I2510 Atherosclerotic heart disease of native coronary artery without angina pectoris: Secondary | ICD-10-CM

## 2022-01-11 DIAGNOSIS — Z8673 Personal history of transient ischemic attack (TIA), and cerebral infarction without residual deficits: Secondary | ICD-10-CM

## 2022-01-11 MED ORDER — ASPIRIN 81 MG PO TBEC: 81.0000 mg | DELAYED_RELEASE_TABLET | Freq: Every day | ORAL | 12 refills | Status: AC

## 2022-01-11 NOTE — Telephone Encounter (Signed)
Patient unable to tolerate PCSK -9 in addition to multiple other cholesterol lowering meds.  She is currently taking red yeast rice and wants to increase that to 2 tabs daily.  Also encouraged eating more oatmeal and almonds.    Pt will re-check cholesterol in 3 months

## 2022-01-11 NOTE — Progress Notes (Signed)
Cardiology Office Note:    Date:  01/13/2022   ID:  Robin Juarez, DOB [birth year], MRN [id number]  PCP:  Tonia Ghent, MD   [facility] Providers Cardiologist:  Quay Burow, MD     Referring MD: Tonia Ghent, MD   Chief Complaint  Patient presents with   Follow-up    Seen for Dr. Gwenlyn Found    History of Present Illness:    Robin Juarez is a 88 y.o. female with a hx of CAD, fibromyalgia, hypertension, hyperlipidemia, and a history of CVA.  She has significant family history of CAD with her brother having bypass surgery.  Myoview in 2016 was abnormal.  She ultimately underwent cardiac catheterization on 09/03/2014 that showed 50% mid LAD stenosis, otherwise normal coronary arteries.  Her chest pain was felt to be noncardiac.  She was readmitted in October 2018 with chest pain.  Echocardiogram showed normal EF with grade 1 DD.  Coronary CT obtained on 06/14/2017 demonstrated 25 to 50% proximal LAD, greater than 70% mid LAD lesion, 25 to 50% left circumflex lesion.  This was followed by repeat cardiac catheterization study on 06/15/2017 that showed 20% ostial LAD, 40% proximal LAD, 50% ostial D1, 30% ostial to proximal LAD, 20% proximal to mid left circumflex lesion.  No culprit lesion was identified.  Medical therapy was recommended.    Patient was last seen by Dr. Gwenlyn Found in February 2022 at which time she was doing well.  More recently, she was seen at the hypertension clinic for blood pressure and cholesterol control.  Unfortunately she is intolerant of multiple medications.  During the most recent follow-up on 09/15/2021, amlodipine was increased to 5 mg daily.  More recently, she got over a stomach virus.  She called her cardiology service on 10/26/2021 complaining of elevated heart rate.  She presented on 3/16 for evaluation of elevated heart rate.  She typically notices her heart rate being elevated in the morning, blood pressure monitor shows that her heart was skipping  beats.  She denied any passing out spell.  I recommended a 3-day heart monitor.  She she also had a significant heart murmur, therefore I recommended a repeat echocardiogram.  Echocardiogram obtained on 11/17/2021 demonstrated EF 63%, no regional wall motion abnormality, no significant valve issue.  Heart monitor showed normal sinus rhythm with PACs.  She presents today for follow-up.  I reviewed the recent heart monitor result.  There was only 2 episode of SVT, both were quite transient.  No further work-up is needed at this time.  I recommended addition of 81 mg aspirin given the prior history of CAD.  She denies any chest pain or worsening dyspnea.  She has no heart failure symptoms.  She can follow-up in 6 months.    Past Medical History:  Diagnosis Date   Arthritis    knee and hip OA- prev injection by Dr. Nelva Bush   Blood transfusion without reported diagnosis    37 yrs ago -s/p back surgery   Chest pain    Coronary artery disease    50% mid LAD by cath January 2016   Depression 1993, 2000   Fibromyalgia    GERD (gastroesophageal reflux disease)    H/O hiatal hernia    Hyperlipidemia    Hypertension    OSA (obstructive sleep apnea)    off tx as of 2020, no sx as of 2020   Osteoporosis    Positive cardiac stress test    Stroke [facility])  Urinary incontinence    UTI (lower urinary tract infection)     Past Surgical History:  Procedure Laterality Date   [employer]   Back fusions  1977   missing vertebra   BACK SURGERY     fusions lumbar area   [place]   x 2   BREAST BIOPSY  07/10/2012   Procedure: BREAST BIOPSY WITH NEEDLE LOCALIZATION;  Surgeon: Rolm Bookbinder, MD;  Location: [place];  Service: General;  Laterality: Right;   CARDIOVASCULAR STRESS TEST  2009   Normal, Dr. Radford Pax   CATARACT EXTRACTION, BILATERAL Bilateral 2005   Childbirth  1954,1956,1957,1960   CYSTOSCOPY N/A 11/25/2012   Procedure:  CYSTOSCOPY;  Surgeon: Reece Packer, MD;  Location: [facility];  Service: Urology;  Laterality: N/A;   dilatation and currettage  1964   LEFT HEART CATH AND CORONARY ANGIOGRAPHY N/A 06/15/2017   Procedure: LEFT HEART CATH AND CORONARY ANGIOGRAPHY;  Surgeon: Troy Sine, MD;  Location: [place] CV LAB;  Service: Cardiovascular;  Laterality: N/A;   LEFT HEART CATHETERIZATION WITH CORONARY ANGIOGRAM N/A 09/03/2014   Procedure: LEFT HEART CATHETERIZATION WITH CORONARY ANGIOGRAM;  Surgeon: Lorretta Harp, MD;  Location: [facility] C[facility] LAB;  Service: Cardiovascular;  Laterality: N/A;   LUMBAR LAMINECTOMY/DECOMPRESSION MICRODISCECTOMY N/A 05/01/2013   Procedure: COMPLETE DECOMPRESSIVE LUMBAR LAMINECTOMY L3-L4 MICRODISCECTOMY L3-L4 ON THE LEFT;  Surgeon: Tobi Bastos, MD;  Location: [facility];  Service: Orthopedics;  Laterality: N/A;   OVARIAN CYST REMOVAL  1964   PUBOVAGINAL SLING N/A 11/25/2012   Procedure: Gaynelle Arabian;  Surgeon: Reece Packer, MD;  Location: [facility];  Service: Urology;  Laterality: N/A;    Current Medications: Current Meds  Medication Sig   amLODipine (NORVASC) 2.5 MG tablet Take 1 tablet (2.5 mg total) by mouth daily.   aspirin EC 81 MG tablet Take 1 tablet (81 mg total) by mouth daily. Swallow whole.   Baclofen 5 MG TABS TAKE 1 TABLET (5 MG) BY MOUTH TWICE DAILY AS NEEDED (FOR MUSCLE SPASMS).   busPIRone (BUSPAR) 10 MG tablet as needed.   Cholecalciferol (VITAMIN D) 50 MCG (2000 UT) CAPS Take 2 capsules (4,000 Units total) by mouth daily.   DULoxetine (CYMBALTA) 30 MG capsule Take 90 mg by mouth daily.    folic acid (FOLVITE) 599 MCG tablet Take 800 mcg by mouth daily.    gabapentin (NEURONTIN) 300 MG capsule Take 1 capsule (300 mg total) by mouth in the morning, at noon, in the evening, and at bedtime. (Patient taking differently: Take 1,200 mg by mouth 3 (three) times daily. 1 tab noon, 1 tab evening, 2 tabs bedtime (4 tabs  total))   KRILL OIL PO Take 100 mg by mouth daily.   lamoTRIgine (LAMICTAL) 100 MG tablet Take 1 tablet (100 mg total) by mouth daily after supper.   metoprolol succinate (TOPROL-XL) 25 MG 24 hr tablet TAKE 1 TABLET BY MOUTH  DAILY   OLANZapine (ZYPREXA) 2.5 MG tablet Take 1 tablet (2.5 mg total) by mouth at bedtime.   Red Yeast Rice Extract (RED YEAST RICE PO) Take by mouth.     Allergies:   Digoxin, Nitrofurantoin, Alendronate sodium, Cephalosporins, Chlordiazepoxide hcl, Cyclobenzaprine, Lisinopril, Phenobarbital-belladonna alk, Praluent [alirocumab], Repatha [evolocumab], Ritalin [methylphenidate hcl], Statins, Trimethoprim, Chlordiazepoxide, Clavulanic acid, Omeprazole, Oxcarbazepine, Sulfa antibiotics, Tetracycline, and Zetia [ezetimibe]   Social History   Socioeconomic History   Marital status: Married  Spouse name: Not on file   Number of children: 4   Years of education: Not on file   Highest education level: Not on file  Occupational History   Occupation: RETIRED    Employer: RETIRED  Tobacco Use   Smoking status: Never   Smokeless tobacco: Never   Tobacco comments:    a small amount when 88 yo  Vaping Use   Vaping Use: Never used  Substance and Sexual Activity   Alcohol use: No    Alcohol/week: 0.0 standard drinks   Drug use: No   Sexual activity: Not Currently  Other Topics Concern   Not on file  Social History Narrative   Married 1953, husband is well.   5 pregnancies, 4 live births, all well (5th was unexpect pregnancy and patient needed to terminate due to concurrent medical problems at the time)   11 grandchildren.  9 great grandchildren   Regular exercise:  No   Social Determinants of Radio broadcast assistant Strain: Low Risk    Difficulty of Paying Living Expenses: Not hard at all  Food Insecurity: No Food Insecurity   Worried About Charity fundraiser in the Last Year: Never true   Ran Out of Food in the Last Year: Never true  Transportation  Needs: No Transportation Needs   Lack of Transportation (Medical): No   Lack of Transportation (Non-Medical): No  Physical Activity: Not on file  Stress: No Stress Concern Present   Feeling of Stress : Not at all  Social Connections: Moderately Integrated   Frequency of Communication with Friends and Family: More than three times a week   Frequency of Social Gatherings with Friends and Family: Once a week   Attends Religious Services: More than 4 times per year   Active Member of [employer] or Organizations: No   Attends Archivist Meetings: Never   Marital Status: Married     Family History: The patient's family history includes Aneurysm in her sister; Arthritis in her mother; Dementia in her sister; Heart disease in her brother; Hypertension in her father and sister; Kidney disease in her maternal grandmother; Parkinsonism in her sister; Stroke in her father. There is no history of Colon cancer or Breast cancer.  ROS:   Please see the history of present illness.     All other systems reviewed and are negative.  EKGs/Labs/Other Studies Reviewed:    The following studies were reviewed today:  Echo 11/17/2021 1. Left ventricular ejection fraction by 3D volume is 63 %. The left  ventricle has normal function. The left ventricle has no regional wall  motion abnormalities. Left ventricular diastolic parameters are  indeterminate.   2. Right ventricular systolic function is normal. The right ventricular  size is normal.   3. The mitral valve is normal in structure. No evidence of mitral valve  regurgitation. No evidence of mitral stenosis.   4. The aortic valve is tricuspid. Aortic valve regurgitation is not  visualized. Mild aortic valve stenosis. Aortic valve area, by VTI measures  1.55 cm. Aortic valve mean gradient measures 8.0 mmHg. Aortic valve Vmax  measures 1.81 m/s.   5. The inferior vena cava is normal in size with greater than 50%  respiratory variability,  suggesting right atrial pressure of 3 mmHg.   Comparison(s): EF 60%.   EKG:  EKG is not ordered today.    Recent Labs: 09/05/2021: ALT 30 10/17/2021: BUN 18; Creatinine, Ser 0.74; Hemoglobin 14.3; Platelets 277; Potassium 3.9; Sodium 139  Recent Lipid Panel    Component Value Date/Time   CHOL 211 (H) 01/04/2022 1001   TRIG 142 01/04/2022 1001   HDL 65 01/04/2022 1001   CHOLHDL 3.2 01/04/2022 1001   CHOLHDL 3 05/24/2020 1048   VLDL 21.2 05/24/2020 1048   LDLCALC 121 (H) 01/04/2022 1001   LDLDIRECT 180.8 03/17/2013 0950     Risk Assessment/Calculations:           Physical Exam:    VS:  BP 130/70   Pulse 77   Ht 5' (1.524 m)   Wt 183 lb 1.6 oz (83.1 kg)   SpO2 93%   BMI 35.76 kg/m     Wt Readings from Last 3 Encounters:  01/11/22 183 lb 1.6 oz (83.1 kg)  01/10/22 183 lb 2 oz (83.1 kg)  12/28/21 183 lb (83 kg)     GEN:  Well nourished, well developed in no acute distress HEENT: Normal NECK: No JVD; No carotid bruits LYMPHATICS: No lymphadenopathy CARDIAC: RRR, no murmurs, rubs, gallops RESPIRATORY:  Clear to auscultation without rales, wheezing or rhonchi  ABDOMEN: Soft, non-tender, non-distended MUSCULOSKELETAL:  No edema; No deformity  SKIN: Warm and dry NEUROLOGIC:  Alert and oriented x 3 PSYCHIATRIC:  Normal affect   ASSESSMENT:    1. Palpitations   2. Coronary artery disease involving native coronary artery of native heart without angina pectoris   3. Essential hypertension   4. Hyperlipidemia LDL goal <70   5. H/O: CVA (cerebrovascular accident)    PLAN:    In order of problems listed above:  Palpitation: She previously noted elevated heart rate in the morning.  Her monitor however shows no significant arrhythmia.  She has rare PVCs and 2 episode of SVT which was quite transient.  CAD: Denies any chest pain  Hypertension: Blood pressure stable  Hyperlipidemia: On Krill oil and red yeast rice.  She is intolerant of statins.  History of CVA:  No recurrence           Medication Adjustments/Labs and Tests Ordered: Current medicines are reviewed at length with the patient today.  Concerns regarding medicines are outlined above.  No orders of the defined types were placed in this encounter.  Meds ordered this encounter  Medications   aspirin EC 81 MG tablet    Sig: Take 1 tablet (81 mg total) by mouth daily. Swallow whole.    Dispense:  30 tablet    Refill:  12    Patient Instructions  Medication Instructions:  START Aspirin 81 daily *If you need a refill on your cardiac medications before your next appointment, please call your pharmacy*  Lab Work: NONE ordered at this time of appointment   If you have labs (blood work) drawn today and your tests are completely normal, you will receive your results only by: [place] (if you have MyChart) OR A paper copy in the mail If you have any lab test that is abnormal or we need to change your treatment, we will call you to review the results.  Testing/Procedures: NONE ordered at this time of appointment   Follow-Up: At [facility], you and your health needs are our priority.  As part of our continuing mission to provide you with exceptional heart care, we have created designated Provider Care Teams.  These Care Teams include your primary Cardiologist (physician) and Advanced Practice Providers (APPs -  Physician Assistants and Nurse Practitioners) who all work together to provide you with the care you need, when you need  it.  We recommend signing up for the patient portal called "MyChart".  Sign up information is provided on this After Visit Summary.  MyChart is used to connect with patients for Virtual Visits (Telemedicine).  Patients are able to view lab/test results, encounter notes, upcoming appointments, etc.  Non-urgent messages can be sent to your provider as well.   To learn more about what you can do with MyChart, go to [email or web address].    Your next  appointment:   6 month(s)  The format for your next appointment:   In Person  Provider:   Quay Burow, MD     Other Instructions   Important Information About Sugar         Hilbert Corrigan, [place]  01/13/2022 11:32 PM    [place]

## 2022-01-11 NOTE — Patient Instructions (Signed)
Medication Instructions:  START Aspirin 81 daily *If you need a refill on your cardiac medications before your next appointment, please call your pharmacy*  Lab Work: NONE ordered at this time of appointment   If you have labs (blood work) drawn today and your tests are completely normal, you will receive your results only by: MyChart Message (if you have MyChart) OR A paper copy in the mail If you have any lab test that is abnormal or we need to change your treatment, we will call you to review the results.  Testing/Procedures: NONE ordered at this time of appointment   Follow-Up: At [facility], you and your health needs are our priority.  As part of our continuing mission to provide you with exceptional heart care, we have created designated Provider Care Teams.  These Care Teams include your primary Cardiologist (physician) and Advanced Practice Providers (APPs -  Physician Assistants and Nurse Practitioners) who all work together to provide you with the care you need, when you need it.  We recommend signing up for the patient portal called "MyChart".  Sign up information is provided on this After Visit Summary.  MyChart is used to connect with patients for Virtual Visits (Telemedicine).  Patients are able to view lab/test results, encounter notes, upcoming appointments, etc.  Non-urgent messages can be sent to your provider as well.   To learn more about what you can do with MyChart, go to [email or web address].    Your next appointment:   6 month(s)  The format for your next appointment:   In Person  Provider:   Nanetta Batty, MD     Other Instructions   Important Information About Sugar

## 2022-01-11 NOTE — Telephone Encounter (Signed)
Pt calling in requesting ldl results and dr. Allyson Sabal commented is she still taking praluent. Pt reported myalgias on both of those. I will route to dr. Johnanna Schneiders to advise

## 2022-01-13 ENCOUNTER — Encounter: Payer: Self-pay | Admitting: Physician Assistant

## 2022-01-30 ENCOUNTER — Encounter: Payer: Self-pay | Admitting: Family Medicine

## 2022-01-30 ENCOUNTER — Ambulatory Visit: Payer: Self-pay

## 2022-01-30 ENCOUNTER — Ambulatory Visit (INDEPENDENT_AMBULATORY_CARE_PROVIDER_SITE_OTHER): Payer: Medicare Other | Admitting: Family Medicine

## 2022-01-30 ENCOUNTER — Telehealth: Payer: Self-pay | Admitting: Family Medicine

## 2022-01-30 VITALS — BP 158/100 | HR 76 | Ht 60.0 in | Wt 182.6 lb

## 2022-01-30 DIAGNOSIS — G8929 Other chronic pain: Secondary | ICD-10-CM | POA: Diagnosis not present

## 2022-01-30 DIAGNOSIS — M25511 Pain in right shoulder: Secondary | ICD-10-CM

## 2022-01-30 DIAGNOSIS — M19011 Primary osteoarthritis, right shoulder: Secondary | ICD-10-CM

## 2022-01-30 DIAGNOSIS — M19012 Primary osteoarthritis, left shoulder: Secondary | ICD-10-CM

## 2022-01-30 DIAGNOSIS — M25512 Pain in left shoulder: Secondary | ICD-10-CM | POA: Diagnosis not present

## 2022-01-30 NOTE — Progress Notes (Signed)
I, Robin Juarez, LAT, ATC, am serving as scribe for Dr. [name].  Robin Juarez is a 88 y.o. female who presents to [facility] today for f/u bilat shoulder pain thought to be due to glenohumeral DJD and possible chronic rotator cuff tear. Prior bilat GH steroid injections given on 11/15/21 were not very beneficial nor long lasting. Pt was last seen by Dr. Denyse Juarez on 12/13/21 and was advised we would continue to search for home health PT options, after 4 different options did not accept her insurance, and use [facility] [address] in [place]. Today, pt reports that her shoulder pain remains about the same, noting that her R shoulder is slightly worse than the L.  She is here to get shoulder injections today prior to her 72th wedding anniversary this Saturday.   Dx imaging: 11/15/21 R & L shoulder XR  Pertinent review of systems: No fevers or chills  Relevant historical information: History of a stroke.  Osteoporosis.   Exam:  BP (!) 158/100 (BP Location: Left Arm, Patient Position: Sitting, Cuff Size: Normal)   Pulse 76   Ht 5' (1.524 m)   Wt 182 lb 9.6 oz (82.8 kg)   SpO2 97%   BMI 35.66 kg/m  General: Well Developed, well nourished, and in no acute distress.   MSK: Shoulders bilaterally decreased range of motion pain with abduction and external rotation and internal rotation.  Lab and Radiology Results  Procedure: Real-time Ultrasound Guided Injection of right shoulder glenohumeral joint posterior approach Device: Philips Affiniti 50G Images permanently stored and available for review in PACS Verbal informed consent obtained.  Discussed risks and benefits of procedure. Warned about infection, bleeding, hyperglycemia damage to structures among others. Patient expresses understanding and agreement Time-out conducted.   Noted no overlying erythema, induration, or other signs of local infection.   Skin prepped in a sterile fashion.    Local anesthesia: Topical Ethyl chloride.   With sterile technique and under real time ultrasound guidance: 40 mg of Kenalog and 2 mL of Marcaine injected into shoulder joint. Fluid seen entering the joint capsule.   Completed without difficulty   Pain immediately resolved suggesting accurate placement of the medication.   Advised to call if fevers/chills, erythema, induration, drainage, or persistent bleeding.   Images permanently stored and available for review in the ultrasound unit.  Impression: Technically successful ultrasound guided injection.    Procedure: Real-time Ultrasound Guided Injection of left shoulder glenohumeral joint posterior approach Device: Philips Affiniti 50G Images permanently stored and available for review in PACS Verbal informed consent obtained.  Discussed risks and benefits of procedure. Warned about infection, bleeding, hyperglycemia damage to structures among others. Patient expresses understanding and agreement Time-out conducted.   Noted no overlying erythema, induration, or other signs of local infection.   Skin prepped in a sterile fashion.   Local anesthesia: Topical Ethyl chloride.   With sterile technique and under real time ultrasound guidance: 40 mg of Kenalog and 2 mL of Marcaine injected into shoulder joint. Fluid seen entering the joint capsule.   Completed without difficulty   Pain immediately resolved suggesting accurate placement of the medication.   Advised to call if fevers/chills, erythema, induration, drainage, or persistent bleeding.   Images permanently stored and available for review in the ultrasound unit.  Impression: Technically successful ultrasound guided injection.         Assessment and Plan: 88 y.o. female with bilateral shoulder pain due to glenohumeral  DJD and chronic rotator cuff tears.  Plan for glenohumeral injection today.  Can repeat this injection every 3 months.  Recheck back in about 3 months or sooner if  needed and will consider repeat injection at that point.[name]     PDMP not reviewed this encounter. Orders Placed This Encounter  Procedures   [place] LIMITED JOINT SPACE STRUCTURES UP BILAT(NO LINKED CHARGES)    Order Specific Question:   Reason for Exam (SYMPTOM  OR DIAGNOSIS REQUIRED)    Answer:   B shoulder pain    Order Specific Question:   Preferred imaging location?    Answer:   [facility] [place]   No orders of the defined types were placed in this encounter.    Discussed warning signs or symptoms. Please see discharge instructions. Patient expresses understanding.   The above documentation has been reviewed and is accurate and complete [name], M.D.

## 2022-01-30 NOTE — Telephone Encounter (Signed)
Husband came in for OV today.  He requested a copy of his living and the same for patient.  Printed at OV, given to him.

## 2022-01-30 NOTE — Patient Instructions (Signed)
Good to see you today.  You had B shoulder injections.  Call or go to the ER if you develop a large red swollen joint with extreme pain or oozing puss.   Follow-up: 3 months  

## 2022-02-06 ENCOUNTER — Telehealth: Payer: Self-pay | Admitting: Cardiovascular Disease

## 2022-02-06 NOTE — Telephone Encounter (Signed)
Patient c/o Palpitations:  High priority if patient c/o lightheadedness, shortness of breath, or chest pain  How long have you had palpitations/irregular HR/ Afib? Are you having the symptoms now? Became aware she was having palpitations yesterday, doesn't believe she is having symptoms now.   Are you currently experiencing lightheadedness, SOB or CP? No   Do you have a history of afib (atrial fibrillation) or irregular heart rhythm? Yes   Have you checked your BP or HR? (document readings if available): Yesterday HR was 106 then dropped to 60 later. Does not remember BP reading said it was a little bit high.   Are you experiencing any other symptoms? No

## 2022-02-06 NOTE — Telephone Encounter (Signed)
Reports yesterday felt something wasn't right (heart racing). BP ranged from 116/62 -139/78, P 65-89.Denies sob/dizziness. She stated she feels heart racing when she eats oatmeal. Appointment made with Verneita Griffes, PA-C

## 2022-02-14 ENCOUNTER — Emergency Department (HOSPITAL_COMMUNITY): Payer: Medicare Other

## 2022-02-14 ENCOUNTER — Emergency Department (HOSPITAL_COMMUNITY)
Admission: EM | Admit: 2022-02-14 | Discharge: 2022-02-14 | Disposition: A | Discharge status: Home / Self Care | Payer: Medicare Other | Attending: Emergency Medicine | Admitting: Emergency Medicine

## 2022-02-14 ENCOUNTER — Other Ambulatory Visit: Payer: Self-pay

## 2022-02-14 DIAGNOSIS — R200 Anesthesia of skin: Secondary | ICD-10-CM | POA: Diagnosis present

## 2022-02-14 DIAGNOSIS — I2510 Atherosclerotic heart disease of native coronary artery without angina pectoris: Secondary | ICD-10-CM | POA: Diagnosis not present

## 2022-02-14 DIAGNOSIS — M79601 Pain in right arm: Secondary | ICD-10-CM | POA: Diagnosis present

## 2022-02-14 DIAGNOSIS — R202 Paresthesia of skin: Secondary | ICD-10-CM

## 2022-02-14 DIAGNOSIS — R002 Palpitations: Secondary | ICD-10-CM

## 2022-02-14 DIAGNOSIS — I10 Essential (primary) hypertension: Secondary | ICD-10-CM | POA: Insufficient documentation

## 2022-02-14 DIAGNOSIS — Z7982 Long term (current) use of aspirin: Secondary | ICD-10-CM | POA: Diagnosis not present

## 2022-02-14 DIAGNOSIS — Z8673 Personal history of transient ischemic attack (TIA), and cerebral infarction without residual deficits: Secondary | ICD-10-CM | POA: Diagnosis not present

## 2022-02-14 DIAGNOSIS — Z79899 Other long term (current) drug therapy: Secondary | ICD-10-CM | POA: Diagnosis not present

## 2022-02-14 LAB — COMPREHENSIVE METABOLIC PANEL
ALT: 18 U/L (ref 0–44)
AST: 18 U/L (ref 15–41)
Albumin: 3.8 g/dL (ref 3.5–5.0)
Alkaline Phosphatase: 74 U/L (ref 38–126)
Anion gap: 10 (ref 5–15)
BUN: 10 mg/dL (ref 8–23)
CO2: 25 mmol/L (ref 22–32)
Calcium: 9.2 mg/dL (ref 8.9–10.3)
Chloride: 107 mmol/L (ref 98–111)
Creatinine, Ser: 0.86 mg/dL (ref 0.44–1.00)
GFR, Estimated: >60 mL/min (ref >60)
Glucose, Bld: 109 mg/dL — ABNORMAL HIGH (ref 70–99)
Potassium: 3.6 mmol/L (ref 3.5–5.1)
Sodium: 142 mmol/L (ref 135–145)
Total Bilirubin: 0.3 mg/dL (ref 0.3–1.2)
Total Protein: 6.1 g/dL — ABNORMAL LOW (ref 6.5–8.1)

## 2022-02-14 LAB — DIFFERENTIAL
Abs Immature Granulocytes: 0.04 10*3/uL (ref 0.00–0.07)
Basophils Absolute: 0.1 10*3/uL (ref 0.0–0.1)
Basophils Relative: 1 %
Eosinophils Absolute: 0.2 10*3/uL (ref 0.0–0.5)
Eosinophils Relative: 3 %
Immature Granulocytes: 1 %
Lymphocytes Relative: 26 %
Lymphs Abs: 2.2 10*3/uL (ref 0.7–4.0)
Monocytes Absolute: 0.6 10*3/uL (ref 0.1–1.0)
Monocytes Relative: 7 %
Neutro Abs: 5.4 10*3/uL (ref 1.7–7.7)
Neutrophils Relative %: 62 %

## 2022-02-14 LAB — URINALYSIS, ROUTINE W REFLEX MICROSCOPIC
Bilirubin Urine: NEGATIVE
Glucose, UA: NEGATIVE mg/dL
Hgb urine dipstick: NEGATIVE
Ketones, ur: NEGATIVE mg/dL
Leukocytes,Ua: NEGATIVE
Nitrite: NEGATIVE
Protein, ur: NEGATIVE mg/dL
Specific Gravity, Urine: 1.005 (ref 1.005–1.030)
pH: 6 (ref 5.0–8.0)

## 2022-02-14 LAB — CBC
HCT: 39.8 % (ref 36.0–46.0)
Hemoglobin: 13.4 g/dL (ref 12.0–15.0)
MCH: 33.5 pg (ref 26.0–34.0)
MCHC: 33.7 g/dL (ref 30.0–36.0)
MCV: 99.5 fL (ref 80.0–100.0)
Platelets: 202 10*3/uL (ref 150–400)
RBC: 4 MIL/uL (ref 3.87–5.11)
RDW: 13.2 % (ref 11.5–15.5)
WBC: 8.5 10*3/uL (ref 4.0–10.5)
nRBC: 0 % (ref 0.0–0.2)

## 2022-02-14 LAB — RAPID URINE DRUG SCREEN, HOSP PERFORMED
Amphetamines: NOT DETECTED
Barbiturates: NOT DETECTED
Benzodiazepines: NOT DETECTED
Cocaine: NOT DETECTED
Opiates: NOT DETECTED
Tetrahydrocannabinol: NOT DETECTED

## 2022-02-14 LAB — TROPONIN I (HIGH SENSITIVITY)
Troponin I (High Sensitivity): 5 ng/L (ref <18)
Troponin I (High Sensitivity): 5 ng/L (ref <18)

## 2022-02-14 LAB — PROTIME-INR
INR: 0.9 (ref 0.8–1.2)
Prothrombin Time: 12.2 seconds (ref 11.4–15.2)

## 2022-02-14 LAB — ETHANOL: Alcohol, Ethyl (B): <10 mg/dL (ref <10)

## 2022-02-14 LAB — APTT: aPTT: 35 seconds (ref 24–36)

## 2022-02-14 NOTE — ED Notes (Signed)
Patient transported to MRI 

## 2022-02-14 NOTE — ED Notes (Signed)
Patient transported to CT 

## 2022-02-15 ENCOUNTER — Ambulatory Visit: Payer: Medicare Other | Admitting: Physician Assistant

## 2022-02-16 ENCOUNTER — Ambulatory Visit: Payer: Medicare Other | Admitting: Physician Assistant

## 2022-02-16 ENCOUNTER — Encounter: Payer: Self-pay | Admitting: Physician Assistant

## 2022-02-16 VITALS — BP 138/66 | HR 75 | Ht 60.0 in | Wt 184.8 lb

## 2022-02-16 DIAGNOSIS — I10 Essential (primary) hypertension: Secondary | ICD-10-CM

## 2022-02-16 DIAGNOSIS — R002 Palpitations: Secondary | ICD-10-CM

## 2022-02-16 DIAGNOSIS — M79601 Pain in right arm: Secondary | ICD-10-CM

## 2022-02-16 DIAGNOSIS — Z8673 Personal history of transient ischemic attack (TIA), and cerebral infarction without residual deficits: Secondary | ICD-10-CM

## 2022-02-16 DIAGNOSIS — I2510 Atherosclerotic heart disease of native coronary artery without angina pectoris: Secondary | ICD-10-CM | POA: Diagnosis not present

## 2022-02-16 NOTE — Patient Instructions (Signed)
Medication Instructions:  May take an extra dose of Metoprolol for increased palpitations    *If you need a refill on your cardiac medications before your next appointment, please call your pharmacy*  Lab Work: NONE ordered at this time of appointment   If you have labs (blood work) drawn today and your tests are completely normal, you will receive your results only by: MyChart Message (if you have MyChart) OR A paper copy in the mail If you have any lab test that is abnormal or we need to change your treatment, we will call you to review the results.  Testing/Procedures: NONE ordered at this time of appointment   Follow-Up: At [facility], you and your health needs are our priority.  As part of our continuing mission to provide you with exceptional heart care, we have created designated Provider Care Teams.  These Care Teams include your primary Cardiologist (physician) and Advanced Practice Providers (APPs -  Physician Assistants and Nurse Practitioners) who all work together to provide you with the care you need, when you need it.  We recommend signing up for the patient portal called "MyChart".  Sign up information is provided on this After Visit Summary.  MyChart is used to connect with patients for Virtual Visits (Telemedicine).  Patients are able to view lab/test results, encounter notes, upcoming appointments, etc.  Non-urgent messages can be sent to your provider as well.   To learn more about what you can do with MyChart, go to [email or web address].    Your next appointment:   As previously scheduled    The format for your next appointment:   In Person  Provider:   Nanetta Batty, MD    Other Instructions   Important Information About Sugar

## 2022-02-16 NOTE — Progress Notes (Signed)
Cardiology Office Note:    Date:  02/17/2022   ID:  Robin Juarez, DOB 25-Dec-1930, MRN [id number]  PCP:  Tonia Ghent, MD   [place] Providers Cardiologist:  Quay Burow, MD     Referring MD: Tonia Ghent, MD   Chief Complaint  Patient presents with   Edema    Bilateral ankle swelling    History of Present Illness:    Robin Juarez is a 88 y.o. female with a hx of CAD, fibromyalgia, hypertension, hyperlipidemia, and a history of CVA.  She has significant family history of CAD with her brother having bypass surgery.  Myoview in 2016 was abnormal.  She ultimately underwent cardiac catheterization on 09/03/2014 that showed 50% mid LAD stenosis, otherwise normal coronary arteries.  Her chest pain was felt to be noncardiac.  She was readmitted in October 2018 with chest pain.  Echocardiogram showed normal EF with grade 1 DD.  Coronary CT obtained on 06/14/2017 demonstrated 25 to 50% proximal LAD, greater than 70% mid LAD lesion, 25 to 50% left circumflex lesion.  This was followed by repeat cardiac catheterization study on 06/15/2017 that showed 20% ostial LAD, 40% proximal LAD, 50% ostial D1, 30% ostial to proximal LAD, 20% proximal to mid left circumflex lesion.  No culprit lesion was identified.  Medical therapy was recommended.     Patient was last seen by Dr. Gwenlyn Found in February 2022 at which time she was doing well.  More recently, she was seen at the hypertension clinic for blood pressure and cholesterol control.  Unfortunately she is intolerant of multiple medications.  During the most recent follow-up on 09/15/2021, amlodipine was increased to 5 mg daily.  Patient had echocardiogram and heart monitor in March 2023 that were reassuring.  Heart monitor was performed due to occasional brief palpitation.  It did reveal 2 transient episode of SVT, neither one lasted very long.  Patient presented to the ED on 02/14/2022 after waking up around 1 AM with right arm  pain and bilateral lower extremity numbness.  Heart rate was in the 62U, systolic blood pressure in the 140s to 150s based on EMS run sheet scanned into epic.  EKG was normal.  Lower extremity numbness quickly improved.  After she arrived at [facility], ED, serial troponin was negative.  CT and MRI of the head were negative.  CBC, CMP, urinalysis were all normal.  Symptom resolved and has not came back since.  She has experienced occasional fluttering sensation however none which has lasted very long.    She presents today along with her daughter, I recommend that she take extra dose of metoprolol on the day she has recurrent palpitation.  Otherwise, as long as the symptoms that occurred 2 days ago does not recur, I would not recommend any further work-up.  Previous cardiac catheterization in 2018 showed nonobstructive CAD and her symptom is inconsistent with ACS.  Otherwise, she can follow-up with Dr. Gwenlyn Found as previously scheduled near the end of this year.   Past Medical History:  Diagnosis Date   Arthritis    knee and hip OA- prev injection by Dr. Nelva Bush   Blood transfusion without reported diagnosis    37 yrs ago -s/p back surgery   Chest pain    Coronary artery disease    50% mid LAD by cath January 2016   Depression 1993, 2000   Fibromyalgia    GERD (gastroesophageal reflux disease)    H/O hiatal hernia  Hyperlipidemia    Hypertension    OSA (obstructive sleep apnea)    off tx as of 2020, no sx as of 2020   Osteoporosis    Positive cardiac stress test    Stroke [facility])    Urinary incontinence    UTI (lower urinary tract infection)     Past Surgical History:  Procedure Laterality Date   [place]   Back fusions  1977   missing vertebra   BACK SURGERY     fusions lumbar area   [place]   x 2   BREAST BIOPSY  07/10/2012   Procedure: BREAST BIOPSY WITH NEEDLE LOCALIZATION;  Surgeon: Rolm Bookbinder, MD;  Location: [place];  Service: General;  Laterality: Right;   CARDIOVASCULAR STRESS TEST  2009   Normal, Dr. Radford Pax   CATARACT EXTRACTION, BILATERAL Bilateral 2005   Childbirth  1954,1956,1957,1960   CYSTOSCOPY N/A 11/25/2012   Procedure: CYSTOSCOPY;  Surgeon: Reece Packer, MD;  Location: [place];  Service: Urology;  Laterality: N/A;   dilatation and currettage  1964   LEFT HEART CATH AND CORONARY ANGIOGRAPHY N/A 06/15/2017   Procedure: LEFT HEART CATH AND CORONARY ANGIOGRAPHY;  Surgeon: Troy Sine, MD;  Location: [place] CV LAB;  Service: Cardiovascular;  Laterality: N/A;   LEFT HEART CATHETERIZATION WITH CORONARY ANGIOGRAM N/A 09/03/2014   Procedure: LEFT HEART CATHETERIZATION WITH CORONARY ANGIOGRAM;  Surgeon: Lorretta Harp, MD;  Location: [facility] CATH LAB;  Service: Cardiovascular;  Laterality: N/A;   LUMBAR LAMINECTOMY/DECOMPRESSION MICRODISCECTOMY N/A 05/01/2013   Procedure: COMPLETE DECOMPRESSIVE LUMBAR LAMINECTOMY L3-L4 MICRODISCECTOMY L3-L4 ON THE LEFT;  Surgeon: Tobi Bastos, MD;  Location: [facility];  Service: Orthopedics;  Laterality: N/A;   OVARIAN CYST REMOVAL  1964   PUBOVAGINAL SLING N/A 11/25/2012   Procedure: Gaynelle Arabian;  Surgeon: Reece Packer, MD;  Location: [facility];  Service: Urology;  Laterality: N/A;    Current Medications: Current Meds  Medication Sig   amLODipine (NORVASC) 2.5 MG tablet Take 1 tablet (2.5 mg total) by mouth daily.   aspirin EC 81 MG tablet Take 1 tablet (81 mg total) by mouth daily. Swallow whole.   Baclofen 5 MG TABS TAKE 1 TABLET (5 MG) BY MOUTH TWICE DAILY AS NEEDED (FOR MUSCLE SPASMS).   busPIRone (BUSPAR) 10 MG tablet as needed.   Cholecalciferol (VITAMIN D) 50 MCG (2000 UT) CAPS Take 2 capsules (4,000 Units total) by mouth daily.   Coenzyme Q10 (CO Q 10 PO) Take 1 tablet by mouth daily.   DULoxetine (CYMBALTA) 30 MG capsule Take 90 mg by mouth daily.    folic acid (FOLVITE) 030 MCG  tablet Take 800 mcg by mouth daily.    gabapentin (NEURONTIN) 300 MG capsule Take 1 capsule (300 mg total) by mouth in the morning, at noon, in the evening, and at bedtime. (Patient taking differently: Take 1,200 mg by mouth 3 (three) times daily. 1 tab noon, 1 tab evening, 2 tabs bedtime (4 tabs total))   KRILL OIL PO Take 100 mg by mouth daily.   lamoTRIgine (LAMICTAL) 100 MG tablet Take 1 tablet (100 mg total) by mouth daily after supper.   metoprolol succinate (TOPROL-XL) 25 MG 24 hr tablet TAKE 1 TABLET BY MOUTH  DAILY   OLANZapine (ZYPREXA) 2.5 MG tablet Take 1 tablet (2.5 mg total) by mouth at bedtime.   Red Yeast Rice Extract (RED YEAST RICE PO)  Take by mouth.     Allergies:   Digoxin, Nitrofurantoin, Alendronate sodium, Cephalosporins, Chlordiazepoxide hcl, Cyclobenzaprine, Lisinopril, Phenobarbital-belladonna alk, Praluent [alirocumab], Repatha [evolocumab], Ritalin [methylphenidate hcl], Statins, Trimethoprim, Chlordiazepoxide, Clavulanic acid, Omeprazole, Oxcarbazepine, Sulfa antibiotics, Tetracycline, and Zetia [ezetimibe]   Social History   Socioeconomic History   Marital status: Married    Spouse name: Not on file   Number of children: 4   Years of education: Not on file   Highest education level: Not on file  Occupational History   Occupation: RETIRED    Employer: RETIRED  Tobacco Use   Smoking status: Never   Smokeless tobacco: Never   Tobacco comments:    a small amount when 88 yo  Vaping Use   Vaping Use: Never used  Substance and Sexual Activity   Alcohol use: No    Alcohol/week: 0.0 standard drinks of alcohol   Drug use: No   Sexual activity: Not Currently  Other Topics Concern   Not on file  Social History Narrative   Married 1953, husband is well.   5 pregnancies, 4 live births, all well (5th was unexpect pregnancy and patient needed to terminate due to concurrent medical problems at the time)   11 grandchildren.  9 great grandchildren   Regular  exercise:  No   Social Determinants of Health   Financial Resource Strain: Low Risk  (12/28/2021)   Overall Financial Resource Strain (CARDIA)    Difficulty of Paying Living Expenses: Not hard at all  Food Insecurity: No Food Insecurity (12/28/2021)   Hunger Vital Sign    Worried About Running Out of Food in the Last Year: Never true    Ran Out of Food in the Last Year: Never true  Transportation Needs: No Transportation Needs (12/28/2021)   PRAPARE - Hydrologist (Medical): No    Lack of Transportation (Non-Medical): No  Physical Activity: Inactive (05/18/2020)   Exercise Vital Sign    Days of Exercise per Week: 0 days    Minutes of Exercise per Session: 0 min  Stress: No Stress Concern Present (12/28/2021)   [place]    Feeling of Stress : Not at all  Social Connections: Moderately Integrated (12/28/2021)   Social Connection and Isolation Panel [NHANES]    Frequency of Communication with Friends and Family: More than three times a week    Frequency of Social Gatherings with Friends and Family: Once a week    Attends Religious Services: More than 4 times per year    Active Member of [employer] or Organizations: No    Attends Archivist Meetings: Never    Marital Status: Married     Family History: The patient's family history includes Aneurysm in her sister; Arthritis in her mother; Dementia in her sister; Heart disease in her brother; Hypertension in her father and sister; Kidney disease in her maternal grandmother; Parkinsonism in her sister; Stroke in her father. There is no history of Colon cancer or Breast cancer.  ROS:   Please see the history of present illness.     All other systems reviewed and are negative.  EKGs/Labs/Other Studies Reviewed:    The following studies were reviewed today:  Echo 11/17/2021  1. Left ventricular ejection fraction by 3D volume is 63 %. The  left  ventricle has normal function. The left ventricle has no regional wall  motion abnormalities. Left ventricular diastolic parameters are  indeterminate.   2. Right  ventricular systolic function is normal. The right ventricular  size is normal.   3. The mitral valve is normal in structure. No evidence of mitral valve  regurgitation. No evidence of mitral stenosis.   4. The aortic valve is tricuspid. Aortic valve regurgitation is not  visualized. Mild aortic valve stenosis. Aortic valve area, by VTI measures  1.55 cm. Aortic valve mean gradient measures 8.0 mmHg. Aortic valve Vmax  measures 1.81 m/s.   5. The inferior vena cava is normal in size with greater than 50%  respiratory variability, suggesting right atrial pressure of 3 mmHg.   Comparison(s): EF 60%.   EKG:  EKG is ordered today.  The ekg ordered today demonstrates normal sinus rhythm, no significant ST-T wave changes  Recent Labs: 02/14/2022: ALT 18; BUN 10; Creatinine, Ser 0.86; Hemoglobin 13.4; Platelets 202; Potassium 3.6; Sodium 142  Recent Lipid Panel    Component Value Date/Time   CHOL 211 (H) 01/04/2022 1001   TRIG 142 01/04/2022 1001   HDL 65 01/04/2022 1001   CHOLHDL 3.2 01/04/2022 1001   CHOLHDL 3 05/24/2020 1048   VLDL 21.2 05/24/2020 1048   LDLCALC 121 (H) 01/04/2022 1001   LDLDIRECT 180.8 03/17/2013 0950     Risk Assessment/Calculations:           Physical Exam:    VS:  BP 138/66   Pulse 75   Ht 5' (1.524 m)   Wt 184 lb 12.8 oz (83.8 kg)   SpO2 91%   BMI 36.09 kg/m     Wt Readings from Last 3 Encounters:  02/16/22 184 lb 12.8 oz (83.8 kg)  02/14/22 180 lb (81.6 kg)  01/30/22 182 lb 9.6 oz (82.8 kg)     GEN:  Well nourished, well developed in no acute distress HEENT: Normal NECK: No JVD; No carotid bruits LYMPHATICS: No lymphadenopathy CARDIAC: RRR, no murmurs, rubs, gallops RESPIRATORY:  Clear to auscultation without rales, wheezing or rhonchi  ABDOMEN: Soft, non-tender,  non-distended MUSCULOSKELETAL:  No edema; No deformity  SKIN: Warm and dry NEUROLOGIC:  Alert and oriented x 3 PSYCHIATRIC:  Normal affect   ASSESSMENT:    1. Palpitations   2. Right arm pain   3. Coronary artery disease involving native coronary artery of native heart without angina pectoris   4. Primary hypertension   5. Hyperlipidemia LDL goal <70   6. H/O: CVA (cerebrovascular accident)    PLAN:    In order of problems listed above:  Palpitation: Patient has occasional palpitation, none of which would last very long.  Previous heart monitor in March 2023 was reassuring with only brief episode of SVT.  She may take extra dose of metoprolol as needed.  Recent echocardiogram was also normal  Right arm pain: She had an episode of right arm pain with bilateral lower extremity numbness that woke her up at 1 AM a few days ago.  She was ruled out of ACS by serial enzymes.  Her symptom is quite atypical.  Previous cardiac catheterization in 2018 showed mild disease.  I recommended continue observation.  CAD: Last cardiac catheterization in October 2018 that showed a 20% ostial LAD, 40% proximal LAD, 50% D1, 30% proximal LAD, 20% proximal and mid left circumflex lesion.  Continue medical therapy  Hypertension: Blood pressure stable  History of CVA: No recent recurrence.           Medication Adjustments/Labs and Tests Ordered: Current medicines are reviewed at length with the patient today.  Concerns regarding medicines are  outlined above.  Orders Placed This Encounter  Procedures   EKG 12-Lead   No orders of the defined types were placed in this encounter.   Patient Instructions  Medication Instructions:  May take an extra dose of Metoprolol for increased palpitations    *If you need a refill on your cardiac medications before your next appointment, please call your pharmacy*  Lab Work: NONE ordered at this time of appointment   If you have labs (blood work) drawn today  and your tests are completely normal, you will receive your results only by: [place] (if you have MyChart) OR A paper copy in the mail If you have any lab test that is abnormal or we need to change your treatment, we will call you to review the results.  Testing/Procedures: NONE ordered at this time of appointment   Follow-Up: At [facility], you and your health needs are our priority.  As part of our continuing mission to provide you with exceptional heart care, we have created designated Provider Care Teams.  These Care Teams include your primary Cardiologist (physician) and Advanced Practice Providers (APPs -  Physician Assistants and Nurse Practitioners) who all work together to provide you with the care you need, when you need it.  We recommend signing up for the patient portal called "MyChart".  Sign up information is provided on this After Visit Summary.  MyChart is used to connect with patients for Virtual Visits (Telemedicine).  Patients are able to view lab/test results, encounter notes, upcoming appointments, etc.  Non-urgent messages can be sent to your provider as well.   To learn more about what you can do with MyChart, go to [email or web address].    Your next appointment:   As previously scheduled    The format for your next appointment:   In Person  Provider:   Quay Burow, MD    Other Instructions   Important Information About Sugar         Signed, Almyra Deforest, [place]  02/17/2022 11:50 PM    [place]

## 2022-03-20 ENCOUNTER — Telehealth: Payer: Self-pay

## 2022-03-20 NOTE — Telephone Encounter (Signed)
Home Health verbal orders  Agency Name: [facility]   Requesting PT  Frequency: 1 once a week for 8 weeks starting today   Please forward to [facility] pool or providers CMA

## 2022-03-21 ENCOUNTER — Telehealth: Payer: Self-pay | Admitting: Family Medicine

## 2022-03-21 NOTE — Telephone Encounter (Signed)
Called Maria back and verbal orders have been given

## 2022-03-21 NOTE — Telephone Encounter (Signed)
Please give the order.  Thanks.   

## 2022-03-21 NOTE — Telephone Encounter (Signed)
LMTCB

## 2022-03-21 NOTE — Telephone Encounter (Signed)
Home Health verbal orders Caller Name:Maria Agency Name: [facility]  Callback number: [phone]  Requesting OT/PT/Skilled nursing/Social Work/Speech:PT  Reason:  Frequency: 1 x a week 8 weeks beginning this week   Please forward to [facility] pool or providers CMA

## 2022-03-21 NOTE — Telephone Encounter (Signed)
Duplicate request. See other phone note 

## 2022-03-27 ENCOUNTER — Other Ambulatory Visit (INDEPENDENT_AMBULATORY_CARE_PROVIDER_SITE_OTHER): Payer: Medicare Other

## 2022-03-27 DIAGNOSIS — E785 Hyperlipidemia, unspecified: Secondary | ICD-10-CM

## 2022-03-28 ENCOUNTER — Other Ambulatory Visit: Payer: Medicare Other

## 2022-03-28 LAB — LIPID PANEL
Chol/HDL Ratio: 3.2 ratio (ref 0.0–4.4)
Cholesterol, Total: 198 mg/dL (ref 100–199)
HDL: 62 mg/dL (ref >39)
LDL Chol Calc (NIH): 114 mg/dL — ABNORMAL HIGH (ref 0–99)
Triglycerides: 125 mg/dL (ref 0–149)
VLDL Cholesterol Cal: 22 mg/dL (ref 5–40)

## 2022-03-28 LAB — SPECIMEN STATUS REPORT

## 2022-04-02 DIAGNOSIS — K219 Gastro-esophageal reflux disease without esophagitis: Secondary | ICD-10-CM

## 2022-04-02 DIAGNOSIS — R21 Rash and other nonspecific skin eruption: Secondary | ICD-10-CM

## 2022-04-02 DIAGNOSIS — M19011 Primary osteoarthritis, right shoulder: Secondary | ICD-10-CM | POA: Diagnosis not present

## 2022-04-02 DIAGNOSIS — M19012 Primary osteoarthritis, left shoulder: Secondary | ICD-10-CM | POA: Diagnosis not present

## 2022-04-02 DIAGNOSIS — Z8673 Personal history of transient ischemic attack (TIA), and cerebral infarction without residual deficits: Secondary | ICD-10-CM

## 2022-04-02 DIAGNOSIS — M1990 Unspecified osteoarthritis, unspecified site: Secondary | ICD-10-CM

## 2022-04-02 DIAGNOSIS — I10 Essential (primary) hypertension: Secondary | ICD-10-CM

## 2022-04-02 DIAGNOSIS — I781 Nevus, non-neoplastic: Secondary | ICD-10-CM

## 2022-04-02 DIAGNOSIS — G8929 Other chronic pain: Secondary | ICD-10-CM | POA: Diagnosis not present

## 2022-04-02 DIAGNOSIS — E785 Hyperlipidemia, unspecified: Secondary | ICD-10-CM

## 2022-04-02 DIAGNOSIS — Z7982 Long term (current) use of aspirin: Secondary | ICD-10-CM

## 2022-04-02 DIAGNOSIS — M810 Age-related osteoporosis without current pathological fracture: Secondary | ICD-10-CM

## 2022-04-02 DIAGNOSIS — I872 Venous insufficiency (chronic) (peripheral): Secondary | ICD-10-CM | POA: Diagnosis not present

## 2022-04-03 ENCOUNTER — Ambulatory Visit (INDEPENDENT_AMBULATORY_CARE_PROVIDER_SITE_OTHER)
Admission: RE | Admit: 2022-04-03 | Discharge: 2022-04-03 | Disposition: A | Discharge status: Home / Self Care | Payer: Medicare Other | Source: Ambulatory Visit | Attending: Family Medicine | Admitting: Family Medicine

## 2022-04-03 ENCOUNTER — Encounter: Payer: Self-pay | Admitting: Family Medicine

## 2022-04-03 ENCOUNTER — Ambulatory Visit (INDEPENDENT_AMBULATORY_CARE_PROVIDER_SITE_OTHER): Payer: Medicare Other | Admitting: Family Medicine

## 2022-04-03 VITALS — BP 110/70 | HR 77 | Temp 97.6°F | Ht 60.0 in | Wt 182.0 lb

## 2022-04-03 DIAGNOSIS — M545 Low back pain, unspecified: Secondary | ICD-10-CM | POA: Diagnosis not present

## 2022-04-03 DIAGNOSIS — M79643 Pain in unspecified hand: Secondary | ICD-10-CM

## 2022-04-03 DIAGNOSIS — E782 Mixed hyperlipidemia: Secondary | ICD-10-CM | POA: Diagnosis not present

## 2022-04-03 DIAGNOSIS — E559 Vitamin D deficiency, unspecified: Secondary | ICD-10-CM

## 2022-04-03 DIAGNOSIS — I10 Essential (primary) hypertension: Secondary | ICD-10-CM | POA: Diagnosis not present

## 2022-04-03 DIAGNOSIS — F339 Major depressive disorder, recurrent, unspecified: Secondary | ICD-10-CM

## 2022-04-03 DIAGNOSIS — M25519 Pain in unspecified shoulder: Secondary | ICD-10-CM

## 2022-04-03 DIAGNOSIS — Z0000 Encounter for general adult medical examination without abnormal findings: Secondary | ICD-10-CM

## 2022-04-03 DIAGNOSIS — M1990 Unspecified osteoarthritis, unspecified site: Secondary | ICD-10-CM

## 2022-04-03 DIAGNOSIS — Z7189 Other specified counseling: Secondary | ICD-10-CM

## 2022-04-03 MED ORDER — GABAPENTIN 300 MG PO CAPS
ORAL_CAPSULE | ORAL | Status: AC
Start: 2022-04-03 — End: ?

## 2022-04-03 MED ORDER — MELOXICAM 7.5 MG PO TABS: 7.5000 mg | ORAL_TABLET | Freq: Two times a day (BID) | ORAL | Status: DC | PRN

## 2022-04-03 MED ORDER — MELOXICAM 7.5 MG PO TABS: 7.5000 mg | ORAL_TABLET | Freq: Two times a day (BID) | ORAL | 2 refills | Status: DC | PRN

## 2022-04-03 NOTE — Progress Notes (Unsigned)
Hypertension:    Using medication without problems or lightheadedness: yes Chest pain with exertion:no Edema: mild BLE edema.  Short of breath: no Labs d/w pt.    L lower back pain, radiates to the L hip.  Noted with walking.  Radiates to the L groin and moves down the L leg.  Better with sitting, quickly better.  No pain laying down.  Going on about 2-3 months intermittently.  We talked about checking plain films today.  See notes on imaging.  H/o vit D def and recheck pending.  She had cut back to 2000 units a day of Vit D but that was a recent change from 4000 units a day.    HLD and she stopped RYR.  Recent labs d/w pt.  She felt better off RYR.    No recent palpitations.  On metoprolol at baseline.  D/w pt about prn BB use, but not needing any extra doses recently.  She has used meloxicam if needed.  Nsaid cautions d/w pt.  She had ortho eval for R wrist pain.  She  had pain on ROM.  No fx per patient report on xrays at ortho.  She has occ positional numbness in the R hand and wrist and forearm.  No fall or trauma.  She was advised by outside clinic to get a referral to rheumatology.    She had psych f/u at baseline.    She had prev shoulder injection per Dr. [name].  Prev injection helped with shoulder pain.  She generally didn't feel well after prev injection but then her joint pain improved.  D/w pt about asking about a lower dose of steroid with next injection.    Tetanus 2015 Flu shot 2021 PNA shot up to date.   shingrix utd covid vaccine d/w pt.   Colon screening not due.  Pap not due.   DXA declined.  D/w pt.   Mammogram declined by patient.   Advance directive d/w pt. Husband designated if patient were incapacitated.  If he is also incapacitated, then her kids are equally designated.    She had her 72th wedding anniversary this year.  Congrats given to patient.    Meds, vitals, and allergies reviewed.   PMH and SH reviewed  ROS: Per HPI unless specifically indicated  in ROS section   GEN: nad, alert and oriented HEENT: ncat NECK: supple w/o LA CV: rrr. PULM: ctab, no inc wob ABD: soft, +bs EXT: trace BLE edema SKIN: no acute rash  L hip greater trochanter not ttp No pain int rotation L hip but SLR positive.   She has mild BLE paresthesia, dec but not absent sensation to light touch at baseline.

## 2022-04-03 NOTE — Patient Instructions (Addendum)
You can ask about a lower dose of steroid with next injection.   Take care.  Glad to see you. Go to the lab on the way out.   If you have mychart we'll likely use that to update you.    Try meloxicam daily with food for about 1 week and see if your hand and lower back/leg feel better.

## 2022-04-04 LAB — VITAMIN D 25 HYDROXY (VIT D DEFICIENCY, FRACTURES): VITD: 27.99 ng/mL — ABNORMAL LOW (ref 30.00–100.00)

## 2022-04-05 DIAGNOSIS — M25519 Pain in unspecified shoulder: Secondary | ICD-10-CM | POA: Insufficient documentation

## 2022-04-05 NOTE — Assessment & Plan Note (Signed)
Advance directive d/w pt. Husband designated if patient were incapacitated.  If he is also incapacitated, then her kids are equally designated.

## 2022-04-05 NOTE — Assessment & Plan Note (Signed)
She has had myalgias on statins, PSK inhibitors, and red yeast rice.  I think it makes sense to stay off all of those medications for now and continue work on diet and exercise.

## 2022-04-05 NOTE — Assessment & Plan Note (Signed)
I put in the referral to rheumatology.  Reasonable to continue as needed meloxicam use.  Routine cautions given to patient.

## 2022-04-05 NOTE — Assessment & Plan Note (Signed)
Tetanus 2015 Flu shot 2021 PNA shot up to date.   shingrix utd covid vaccine d/w pt.   Colon screening not due.  Pap not due.   DXA declined.  D/w pt.   Mammogram declined by patient.   Advance directive d/w pt. Husband designated if patient were incapacitated.  If he is also incapacitated, then her kids are equally designated.

## 2022-04-05 NOTE — Assessment & Plan Note (Signed)
I asked her to try meloxicam with NSAID cautions for about 1 week and see if she improved.  She can update me as needed.

## 2022-04-05 NOTE — Assessment & Plan Note (Signed)
Per psychiatry.  I will defer.  She agrees. 

## 2022-04-05 NOTE — Assessment & Plan Note (Signed)
See notes on labs. 

## 2022-04-05 NOTE — Assessment & Plan Note (Signed)
Blood pressures controlled.  She has not needed extra doses of beta-blocker to control palpitations.

## 2022-04-05 NOTE — Assessment & Plan Note (Signed)
She had improvement in her shoulder pain with injection but she generally did not feel well afterward, for a few days.  I question if this was related to the total dose of steroid and I asked her to check to see if she can get a lower dose with future injections.

## 2022-04-06 ENCOUNTER — Telehealth: Payer: Self-pay | Admitting: Family Medicine

## 2022-04-06 DIAGNOSIS — M545 Low back pain, unspecified: Secondary | ICD-10-CM

## 2022-04-06 NOTE — Telephone Encounter (Signed)
Patient husband Steward Ros called in stating that they contacted Dr. Corliss Skains office and they are booked out until October 30th. He was wondering if there was another office she could be transferred to. He would like for someone to give him a call back regarding this matter. Thank you!

## 2022-04-06 NOTE — Telephone Encounter (Signed)
Looks like results are back but not resulted yet. Send back once done and I will let patient know her results.

## 2022-04-06 NOTE — Telephone Encounter (Signed)
Patient called in regarding her lab results. Thank you! 

## 2022-04-07 ENCOUNTER — Other Ambulatory Visit: Payer: Self-pay | Admitting: Family Medicine

## 2022-04-07 DIAGNOSIS — E559 Vitamin D deficiency, unspecified: Secondary | ICD-10-CM

## 2022-04-07 MED ORDER — VITAMIN D (ERGOCALCIFEROL) 1.25 MG (50000 UNIT) PO CAPS: 50000.0000 [IU] | ORAL_CAPSULE | ORAL | 0 refills | Status: DC

## 2022-04-07 MED ORDER — PREDNISONE 10 MG PO TABS: ORAL_TABLET | ORAL | 0 refills | Status: DC

## 2022-04-07 NOTE — Telephone Encounter (Signed)
I put in the referral ortho and I would try a short course of prednisone with food to see if that helps with arm/hand/back pain. I sent the rx.  Thanks.

## 2022-04-07 NOTE — Telephone Encounter (Signed)
lmtcb

## 2022-04-07 NOTE — Telephone Encounter (Signed)
When I went on phone the pt was on the phone and pt said that her ankles are still swollen, no worse but no better with the swelling. Pt has pain level of 7 with her arm, wrist and hands. Pt notified per Dr Lianne Bushy instruction and pt is OK to wait to hear from [place] about sooner referral with a rheumatologist. Pt said was going to be in and out and try home # [phone] and if pt does not answer call cell [phone]. Pt also requested lab and xray results and pt was given info as instructed per result notes and pt voiced understanding. Pt will pick up Vit D 50,000 units caps from [facility] [place] and pt scheduled non fasting lab appt for Vit D on 07/11/22.Pt does want referral to orthopedic in [facility] re; to back xray results. Pts husband then got on phone and requested info on how to get started with mychart; gave pt's husband my chart # to call [phone]; nothing else needed at this time. Sending note to Dr Para March and Geronimo Running [facility].

## 2022-04-07 NOTE — Telephone Encounter (Signed)
See result note.  Thanks!

## 2022-04-07 NOTE — Telephone Encounter (Signed)
Patient husband Steward Ros called in stating that the patient is in a lot of pain today. He stated that her legs and hands are swelling, and that she is having shoulder pain. He stated that he doesn't want to take her to the ER because they wouldn't be able to do anything. I did explain to Steward Ros that you all where trying to locate another clinic that would be available sooner. Sent him over to talk to [place]. Thank you!

## 2022-04-07 NOTE — Addendum Note (Signed)
Addended by: Joaquim Nam on: 04/07/2022 02:01 PM   Modules accepted: Orders

## 2022-04-07 NOTE — Telephone Encounter (Signed)
Is there another clinic that would be available sooner? I routed this to [facility] for input.  Thanks.

## 2022-04-10 NOTE — Telephone Encounter (Signed)
Patient returned call, states she will be available by her cell phone

## 2022-04-10 NOTE — Telephone Encounter (Signed)
Spoke with patient and advised referral for ortho was put in and she has already started the prednisone.

## 2022-04-25 ENCOUNTER — Telehealth: Payer: Self-pay | Admitting: Family Medicine

## 2022-04-25 NOTE — Telephone Encounter (Signed)
Pt called in wants to know if she should continue tacking meloxicam (MOBIC) 7.5 MG tablet also stated she has over the counter vitamin D wants to know how much should she take . Please Advise [phone]

## 2022-04-25 NOTE — Telephone Encounter (Signed)
Called and spoke with patient. She should have had 13 weeks of caps after chart review. Patient states she took one capsule everyday until they were gone. She did not remember instructions that were given verbally nor did she read the bottle. I advised her not to take any OTC vit D until I sent this message on to Dr. Para March for review. Does she or when does she need to proceed with the OTC Vit D?   Patient states the meloxicam has helped and wanted to continue on with those as needed. I advised patient that was fine if she took them as she needed.

## 2022-04-26 NOTE — Telephone Encounter (Signed)
Patient notified as instructed by telephone and verbalized understanding. Lab appointment scheduled for 05/04/22.

## 2022-04-26 NOTE — Telephone Encounter (Signed)
Should be okay to take meloxicam prn with food.  I wouldn't take any extra vit D now.  I would recheck vit D level in about 10 days.  Order is in EMR.  Please set up lab visit and we'll go from there.  Thanks.

## 2022-04-26 NOTE — Addendum Note (Signed)
Addended by: Joaquim Nam on: 04/26/2022 02:58 PM   Modules accepted: Orders

## 2022-05-01 ENCOUNTER — Ambulatory Visit (INDEPENDENT_AMBULATORY_CARE_PROVIDER_SITE_OTHER): Payer: Medicare Other

## 2022-05-01 ENCOUNTER — Ambulatory Visit: Payer: Self-pay

## 2022-05-01 ENCOUNTER — Ambulatory Visit (INDEPENDENT_AMBULATORY_CARE_PROVIDER_SITE_OTHER): Payer: Medicare Other | Admitting: Sports Medicine

## 2022-05-01 VITALS — BP 136/82 | HR 86

## 2022-05-01 DIAGNOSIS — M25512 Pain in left shoulder: Secondary | ICD-10-CM

## 2022-05-01 DIAGNOSIS — M25552 Pain in left hip: Secondary | ICD-10-CM

## 2022-05-01 DIAGNOSIS — M79641 Pain in right hand: Secondary | ICD-10-CM

## 2022-05-01 DIAGNOSIS — M25511 Pain in right shoulder: Secondary | ICD-10-CM | POA: Diagnosis not present

## 2022-05-01 DIAGNOSIS — M5442 Lumbago with sciatica, left side: Secondary | ICD-10-CM

## 2022-05-01 DIAGNOSIS — G8929 Other chronic pain: Secondary | ICD-10-CM

## 2022-05-01 NOTE — Progress Notes (Signed)
   Procedure Note  Patient: Robin Juarez             Date of Birth: 1930-09-06           MRN: [id number]             Visit Date: 05/01/2022  Procedures: Visit Diagnoses:  1. Chronic pain of both shoulders   2. Pain in right hand   3. Pain of left hip   4. Chronic left-sided low back pain with left-sided sciatica     US-guided glenohumeral joint injection, right shoulder After discussion on risks/benefits/indications, informed verbal consent was obtained. A timeout was then performed. The patient was positioned lying lateral recumbent on examination table. The patient's shoulder was prepped with betadine and multiple alcohol swabs and utilizing ultrasound guidance, the patient's glenohumeral joint was identified on ultrasound. Using ultrasound guidance a 22-gauge, 3.5 inch needle with a mixture of 2:2:1 cc's lidocaine:bupivicaine:depomedrol was directed from a lateral to medial direction via in-plane technique into the glenohumeral joint with visualization of appropriate spread of injectate into the joint. Patient tolerated the procedure well without immediate complications.   -If patient wants to wait about 3-4 weeks we can do the contralateral shoulder if she is interested -Otherwise follow-up with Dr. Magnus Ivan as indicated  Madelyn Brunner, DO Primary Care Sports Medicine Physician  [facility] - Orthopedics  This note was dictated using Dragon naturally speaking software and may contain errors in syntax, spelling, or content which have not been identified prior to signing this note.

## 2022-05-01 NOTE — Addendum Note (Signed)
Addended by: Ellis Savage on: 05/01/2022 03:14 PM   Modules accepted: Orders

## 2022-05-01 NOTE — Progress Notes (Signed)
+  Office Visit Note   Patient: Robin Juarez           Date of Birth: 1931-04-21           MRN: [id number] Visit Date: 05/01/2022              Requested by: Joaquim Nam, MD [address] [place],  [place] [address] PCP: Joaquim Nam, MD   Assessment & Plan: Visit Diagnoses:  1. Pain in right hand   2. Chronic pain of both shoulders   3. Pain of left hip   4. Chronic left-sided low back pain with left-sided sciatica     Plan: I was able to review previous spine films.  She does have severe degenerative changes in her lumbar spine.  I could see her hip joint on both right and left hips and showed no significant arthritic findings.  I did review shoulder x-rays that shows significant glenohumeral arthritis of both shoulders.  She does show signs of trochanteric bursitis with pain over the left hip area.  I did recommend a steroid injection over this area which she tolerated well.  I would like to send her up to Dr. Shon Baton today to see if he can provide an intra-articular injection under ultrasound with a steroid in her right shoulder joint.  She agrees with this treatment plan.  I would like to see her back in 4 weeks to see how she is doing overall.  If she still having left-sided groin pain, we will have a dedicated AP pelvis and a lateral of her left hip.  Follow-Up Instructions: No follow-ups on file.   Orders:  Orders Placed This Encounter  Procedures   XR Hand Complete Right   No orders of the defined types were placed in this encounter.     Procedures: No procedures performed   Clinical Data: No additional findings.   Subjective: Chief Complaint  Patient presents with   Lower Back - Pain  The patient is someone I am seeing for the first time.  She is a pleasant 88 year old female who comes with her husband with multiple orthopedic musculoskeletal issues.  She has had 2 lumbar spine surgeries in the past by a colleague in town.  She has known  glenohumeral joint arthritis of both shoulders.  She has been having some left hip pain as well.  She has also been having right hand pain and swelling.  She is not interested in any type of surgical intervention at this standpoint.  She has had steroid injections in her shoulders in the past but it has been several months and at least over 3 months or more.  She said she has been waking up with right hand swelling but that has been coming down.  She does report some left hip groin pain.  I was able to review her records and her past medical history as well as medication history.  HPI  Review of Systems   Objective: Vital Signs: There were no vitals taken for this visit.  Physical Exam Today there is no listed fever, chills, nausea, vomiting.  She is alert and oriented x3 and in no acute distress Ortho Exam I was able to examine her left hip.  It moves smoothly and fluidly with no blocks to rotation.  She does exhibit a lot of pain over the trochanteric area of that left hip.  Both shoulders have limited abduction with some weakness and pain.  Her right hand  exam today shows no swelling.  There is some basilar thumb joint pain and some stiffness in her IP joints.  Her hand is well-perfused with normal sensation.  There is pain with flexion extension of the lumbar spine. Specialty Comments:  No specialty comments available.  Imaging: No results found.   PMFS History: Patient Active Problem List   Diagnosis Date Noted   Shoulder pain 04/05/2022   Venous stasis of both lower extremities 01/10/2022   Gait instability 01/10/2022   Chronic pain of both shoulders 12/13/2021   Bilateral shoulder region arthritis 11/15/2021   Cerebrovascular accident (HCC) 06/30/2021   Osteoarthritis 06/30/2021   Calculus of gallbladder without cholecystitis without obstruction 02/13/2021   Coronary artery disease 09/21/2020   Health care maintenance 05/22/2019   Vitamin D deficiency 05/22/2019   Cough  04/02/2019   Chest pain 06/14/2017   Left hip pain 12/20/2016   Skin change 03/03/2016   Leg length discrepancy 11/19/2014   Dysuria 10/25/2014   Positive cardiac stress test    Essential hypertension 08/19/2014   Atypical chest pain 08/18/2014   CVA (cerebral infarction) 08/18/2014   Balance problem 07/14/2014   Advance care planning 03/02/2014   Spinal stenosis, lumbar region, with neurogenic claudication 05/01/2013   Low back pain 04/17/2013   Paresthesia 08/21/2012   Tremor 08/21/2012   OSA (obstructive sleep apnea) 08/07/2012   Medicare annual wellness visit, subsequent 01/12/2012   Thumb pain 08/25/2011   Weakness 04/27/2011   HYPERGLYCEMIA, MILD 09/12/2010   DISORDER OF BONE AND CARTILAGE UNSPECIFIED 05/23/2010   Anxiety state 03/01/2010   ESOPHAGEAL SPASM 03/01/2010   HIATAL HERNIA WITH REFLUX 03/01/2010   GENERALIZED OSTEOARTHROSIS UNSPECIFIED SITE 03/01/2010   RHEUMATISM UNSPECIFIED AND FIBROSITIS 03/01/2010   Myalgia 03/01/2010   OSTEOPOROSIS 03/01/2010   HLD (hyperlipidemia) 02/25/2010   MDD (major depressive disorder) 02/25/2010   GERD 02/25/2010   URINARY INCONTINENCE 02/25/2010   UTI'S, HX OF 02/25/2010   Past Medical History:  Diagnosis Date   Arthritis    knee and hip OA- prev injection by Dr. Ethelene Hal   Blood transfusion without reported diagnosis    37 yrs ago -s/p back surgery   Chest pain    Coronary artery disease    50% mid LAD by cath January 2016   Depression 1993, 2000   Fibromyalgia    GERD (gastroesophageal reflux disease)    H/O hiatal hernia    Hyperlipidemia    Hypertension    OSA (obstructive sleep apnea)    off tx as of 2020, no sx as of 2020   Osteoporosis    Positive cardiac stress test    Stroke [facility])    Urinary incontinence    UTI (lower urinary tract infection)     Family History  Problem Relation Age of Onset   Arthritis Mother    Stroke Father    Hypertension Father    Heart disease Brother        Died during CABG    Hypertension Sister    Dementia Sister    Aneurysm Sister        Brain   Parkinsonism Sister    Kidney disease Maternal Grandmother    Colon cancer Neg Hx    Breast cancer Neg Hx     Past Surgical History:  Procedure Laterality Date   ABDOMINAL HYSTERECTOMY  1976   APPENDECTOMY  1964   Back fusions  1977   missing vertebra   BACK SURGERY     fusions lumbar area  BREAST BIOPSY  1995   x 2   BREAST BIOPSY  07/10/2012   Procedure: BREAST BIOPSY WITH NEEDLE LOCALIZATION;  Surgeon: Emelia Loron, MD;  Location: [facility];  Service: General;  Laterality: Right;   CARDIOVASCULAR STRESS TEST  2009   Normal, Dr. Mayford Knife   CATARACT EXTRACTION, BILATERAL Bilateral 2005   Childbirth  [id number]   CYSTOSCOPY N/A 11/25/2012   Procedure: CYSTOSCOPY;  Surgeon: Martina Sinner, MD;  Location: [facility];  Service: Urology;  Laterality: N/A;   dilatation and currettage  1964   LEFT HEART CATH AND CORONARY ANGIOGRAPHY N/A 06/15/2017   Procedure: LEFT HEART CATH AND CORONARY ANGIOGRAPHY;  Surgeon: Lennette Bihari, MD;  Location: [facility] INVASIVE [facility];  Service: Cardiovascular;  Laterality: N/A;   LEFT HEART CATHETERIZATION WITH CORONARY ANGIOGRAM N/A 09/03/2014   Procedure: LEFT HEART CATHETERIZATION WITH CORONARY ANGIOGRAM;  Surgeon: Runell Gess, MD;  Location: [facility] CATH LAB;  Service: Cardiovascular;  Laterality: N/A;   LUMBAR LAMINECTOMY/DECOMPRESSION MICRODISCECTOMY N/A 05/01/2013   Procedure: COMPLETE DECOMPRESSIVE LUMBAR LAMINECTOMY L3-L4 MICRODISCECTOMY L3-L4 ON THE LEFT;  Surgeon: Jacki Cones, MD;  Location: [facility];  Service: Orthopedics;  Laterality: N/A;   OVARIAN CYST REMOVAL  1964   PUBOVAGINAL SLING N/A 11/25/2012   Procedure: Leonides Grills;  Surgeon: Martina Sinner, MD;  Location: [facility];  Service: Urology;  Laterality: N/A;   Social History   Occupational History   Occupation: RETIRED    Employer: RETIRED   Tobacco Use   Smoking status: Never   Smokeless tobacco: Never   Tobacco comments:    a small amount when 88 yo  Vaping Use   Vaping Use: Never used  Substance and Sexual Activity   Alcohol use: No    Alcohol/week: 0.0 standard drinks of alcohol   Drug use: No   Sexual activity: Not Currently

## 2022-05-02 ENCOUNTER — Telehealth: Payer: Self-pay

## 2022-05-02 ENCOUNTER — Telehealth: Payer: Self-pay | Admitting: Sports Medicine

## 2022-05-02 ENCOUNTER — Ambulatory Visit: Payer: Medicare Other | Admitting: Family Medicine

## 2022-05-02 NOTE — Telephone Encounter (Signed)
Pt called requesting a call back concerning her injection she received a few days ago. Has medical questions about her taking prednisone and having the injection. Please call pt at [phone].

## 2022-05-02 NOTE — Telephone Encounter (Signed)
FYI-  Patient stated that she forgot to let Dr. Magnus Ivan and Dr. Shon Baton know that she was prescribed Prednisone. CB# [phone].  Please advise.  Thank you.

## 2022-05-04 ENCOUNTER — Other Ambulatory Visit (INDEPENDENT_AMBULATORY_CARE_PROVIDER_SITE_OTHER): Payer: Medicare Other

## 2022-05-04 DIAGNOSIS — E559 Vitamin D deficiency, unspecified: Secondary | ICD-10-CM

## 2022-05-04 LAB — VITAMIN D 25 HYDROXY (VIT D DEFICIENCY, FRACTURES): VITD: 48.09 ng/mL (ref 30.00–100.00)

## 2022-05-05 ENCOUNTER — Other Ambulatory Visit: Payer: Self-pay

## 2022-05-05 ENCOUNTER — Other Ambulatory Visit: Payer: Self-pay | Admitting: Pharmacist

## 2022-05-05 DIAGNOSIS — I2510 Atherosclerotic heart disease of native coronary artery without angina pectoris: Secondary | ICD-10-CM

## 2022-05-05 DIAGNOSIS — E782 Mixed hyperlipidemia: Secondary | ICD-10-CM

## 2022-05-06 LAB — LIPID PANEL
Chol/HDL Ratio: 3.3 ratio (ref 0.0–4.4)
Cholesterol, Total: 249 mg/dL — ABNORMAL HIGH (ref 100–199)
HDL: 76 mg/dL (ref >39)
LDL Chol Calc (NIH): 158 mg/dL — ABNORMAL HIGH (ref 0–99)
Triglycerides: 90 mg/dL (ref 0–149)
VLDL Cholesterol Cal: 15 mg/dL (ref 5–40)

## 2022-05-09 ENCOUNTER — Other Ambulatory Visit: Payer: Self-pay | Admitting: Cardiovascular Disease

## 2022-05-10 ENCOUNTER — Other Ambulatory Visit: Payer: Self-pay | Admitting: Family Medicine

## 2022-05-10 ENCOUNTER — Other Ambulatory Visit: Payer: Self-pay | Admitting: Pharmacist Clinician (PhC)/ Clinical Pharmacy Specialist

## 2022-05-10 MED ORDER — ROSUVASTATIN CALCIUM 10 MG PO TABS: ORAL_TABLET | ORAL | 6 refills | Status: DC

## 2022-05-10 NOTE — Telephone Encounter (Signed)
LDL elevated again off medication.  Reviewed options.  Patient agrees that her myalgias may be more related to health issues, as they are no different than when she was taking different medications.  Will re-challenge with rosuvastatin 10 mg three days per week

## 2022-05-16 ENCOUNTER — Other Ambulatory Visit: Payer: Self-pay | Admitting: Pharmacist Clinician (PhC)/ Clinical Pharmacy Specialist

## 2022-05-16 MED ORDER — ROSUVASTATIN CALCIUM 10 MG PO TABS: ORAL_TABLET | ORAL | 3 refills | Status: DC

## 2022-05-19 ENCOUNTER — Telehealth: Payer: Self-pay | Admitting: Family Medicine

## 2022-05-19 NOTE — Telephone Encounter (Signed)
Noted. Thanks.

## 2022-05-19 NOTE — Telephone Encounter (Signed)
Please be on the lookout for access nurse note. 

## 2022-05-19 NOTE — Telephone Encounter (Signed)
Patient called in and stated that she is having pain in her shoulders, down her right arm and hand with numbness. Sent over to access nurse.

## 2022-05-19 NOTE — Telephone Encounter (Signed)
Spoke with Jaquelyn Bitter, spouse regarding Access Nurse call.  Pain, numbess and swelling in right arm started 1 month ago. Received a shot in shoulder by Dr. Rolena Infante 2 weeks ago. Pt denies chest pain, SOB or vision changes. Pain is 7-8/10. Pt feels that the pain is coming from her shoulder being "bone on bone."  Has an appt with Dr. Tamera Punt, Ortho Surgeon 06/07/22 but will reach out to see if she can get a sooner appt. If no sooner appt is available she will go to [facility]. UC and ED precautions given, spouse and pt voiced understanding.  Sending note to Dr. Damita Dunnings and Anda Kraft, NP who is covering for Dr. Damita Dunnings.     [place] [facility] - Client TELEPHONE ADVICE RECORD AccessNurse Patient Name: Robin Juarez [facility] Gender: Female DOB: Jun 24, 1931 Age: 88 Y 10 M 30 D Return Phone Number: [phone] (Primary), [phone] (Secondary) Address: City/ State/ Zip: [place] [address] Client [facility] - Client Client Site [place] - Day Provider Renford Dills - MD Contact Type Call Who Is Calling Patient / Member / Family / Caregiver Call Type Triage / Clinical Relationship To Patient Self Return Phone Number [phone] (Primary) Chief Complaint Arm Pain (no known cause) Reason for Call Symptomatic / Request for [place] states she has pain in shoulder and down into right arm with numbness, no recent injury. Translation No Nurse Assessment Nurse: Redmond Pulling, RN, Levada Dy Date/Time Eilene Ghazi Time): 05/19/2022 10:06:09 AM Confirm and document reason for call. If symptomatic, describe symptoms. ---Caller states she has pain in right shoulder and down into right arm with numbness, no recent injury. States both of her shoulders are bone to bone. Hand and arm swell at night. Has been taking Meloxicam. Blood pressure 147/84 this morning. Does the patient have any new or worsening symptoms?  ---Yes Will a triage be completed? ---Yes Related visit to physician within the last 2 weeks? ---Yes Does the PT have any chronic conditions? (i.e. diabetes, asthma, this includes High risk factors for pregnancy, etc.) ---Yes List chronic conditions. ---Depression, HTN Is this a behavioral health or substance abuse call? ---No Guidelines Guideline Title Affirmed Question Affirmed Notes Nurse Date/Time (Eastern Time) Shoulder Pain [1] Age > 40 AND [2] no obvious cause AND [3] pain even when not moving the arm (Exception: Pain is clearly made worse by moving arm or bending neck.) Redmond Pulling, RN, Levada Dy 05/19/2022 10:10:24 AM PLEASE NOTE: All timestamps contained within this report are represented as [place] Standard Time. CONFIDENTIALTY NOTICE: This fax transmission is intended only for the addressee. It contains information that is legally privileged, confidential or otherwise protected from use or disclosure. If you are not the intended recipient, you are strictly prohibited from reviewing, disclosing, copying using or disseminating any of this information or taking any action in reliance on or regarding this information. If you have received this fax in error, please notify us immediately by telephone so that we can arrange for its return to [place]. Phone: [phone], Toll-Free: [phone], Fax: [phone] Page: 2 of 2 Call Id: [id number] [place]. Time Eilene Ghazi Time) Disposition Final User 05/19/2022 10:13:45 AM Go to ED Now Yes Redmond Pulling, RN, Levada Dy Final Disposition 05/19/2022 10:13:45 AM Go to ED Now Yes Redmond Pulling, RN, Marin Shutter Disagree/Comply Disagree Caller Understands Yes PreDisposition Did not know what to do Care Advice Given Per Guideline GO TO ED NOW: * You need to be seen in the Emergency Department. NOTE TO TRIAGER -  DRIVING: * Another adult should drive. Referrals GO TO FACILITY REFUSED

## 2022-05-26 ENCOUNTER — Other Ambulatory Visit: Payer: Self-pay | Admitting: Family Medicine

## 2022-05-29 ENCOUNTER — Ambulatory Visit: Payer: Medicare Other | Admitting: Family Medicine

## 2022-06-06 ENCOUNTER — Ambulatory Visit: Payer: Medicare Other

## 2022-06-07 ENCOUNTER — Ambulatory Visit (INDEPENDENT_AMBULATORY_CARE_PROVIDER_SITE_OTHER): Payer: Medicare Other

## 2022-06-07 DIAGNOSIS — Z23 Encounter for immunization: Secondary | ICD-10-CM

## 2022-06-19 ENCOUNTER — Ambulatory Visit: Payer: Medicare Other | Admitting: Internal Medicine

## 2022-06-29 ENCOUNTER — Ambulatory Visit: Payer: Medicare Other | Admitting: Family Medicine

## 2022-07-09 ENCOUNTER — Other Ambulatory Visit: Payer: Self-pay | Admitting: Family Medicine

## 2022-07-09 DIAGNOSIS — E782 Mixed hyperlipidemia: Secondary | ICD-10-CM

## 2022-07-09 DIAGNOSIS — I10 Essential (primary) hypertension: Secondary | ICD-10-CM

## 2022-07-11 ENCOUNTER — Other Ambulatory Visit (INDEPENDENT_AMBULATORY_CARE_PROVIDER_SITE_OTHER): Payer: Medicare Other

## 2022-07-11 ENCOUNTER — Other Ambulatory Visit: Payer: Self-pay | Admitting: Family Medicine

## 2022-07-11 DIAGNOSIS — I10 Essential (primary) hypertension: Secondary | ICD-10-CM | POA: Diagnosis not present

## 2022-07-11 DIAGNOSIS — E782 Mixed hyperlipidemia: Secondary | ICD-10-CM | POA: Diagnosis not present

## 2022-07-11 DIAGNOSIS — E559 Vitamin D deficiency, unspecified: Secondary | ICD-10-CM

## 2022-07-11 LAB — CBC WITH DIFFERENTIAL/PLATELET
Basophils Absolute: 0.1 10*3/uL (ref 0.0–0.1)
Basophils Relative: 0.8 % (ref 0.0–3.0)
Eosinophils Absolute: 0.2 10*3/uL (ref 0.0–0.7)
Eosinophils Relative: 1.8 % (ref 0.0–5.0)
HCT: 40.3 % (ref 36.0–46.0)
Hemoglobin: 13.6 g/dL (ref 12.0–15.0)
Lymphocytes Relative: 20.4 % (ref 12.0–46.0)
Lymphs Abs: 2 10*3/uL (ref 0.7–4.0)
MCHC: 33.9 g/dL (ref 30.0–36.0)
MCV: 96 fl (ref 78.0–100.0)
Monocytes Absolute: 0.5 10*3/uL (ref 0.1–1.0)
Monocytes Relative: 5.4 % (ref 3.0–12.0)
Neutro Abs: 6.8 10*3/uL (ref 1.4–7.7)
Neutrophils Relative %: 71.6 % (ref 43.0–77.0)
Platelets: 298 10*3/uL (ref 150.0–400.0)
RBC: 4.2 Mil/uL (ref 3.87–5.11)
RDW: 13.8 % (ref 11.5–15.5)
WBC: 9.6 10*3/uL (ref 4.0–10.5)

## 2022-07-11 LAB — COMPREHENSIVE METABOLIC PANEL
ALT: 10 U/L (ref 0–35)
AST: 16 U/L (ref 0–37)
Albumin: 4.3 g/dL (ref 3.5–5.2)
Alkaline Phosphatase: 75 U/L (ref 39–117)
BUN: 7 mg/dL (ref 6–23)
CO2: 31 mEq/L (ref 19–32)
Calcium: 9.6 mg/dL (ref 8.4–10.5)
Chloride: 99 mEq/L (ref 96–112)
Creatinine, Ser: 0.7 mg/dL (ref 0.40–1.20)
GFR: 76.87 mL/min (ref >60.00)
Glucose, Bld: 102 mg/dL — ABNORMAL HIGH (ref 70–99)
Potassium: 4.1 mEq/L (ref 3.5–5.1)
Sodium: 136 mEq/L (ref 135–145)
Total Bilirubin: 0.6 mg/dL (ref 0.2–1.2)
Total Protein: 7.2 g/dL (ref 6.0–8.3)

## 2022-07-11 LAB — LIPID PANEL
Cholesterol: 172 mg/dL (ref 0–200)
HDL: 61.9 mg/dL (ref >39.00)
LDL Cholesterol: 76 mg/dL (ref 0–99)
NonHDL: 109.81
Total CHOL/HDL Ratio: 3
Triglycerides: 170 mg/dL — ABNORMAL HIGH (ref 0.0–149.0)
VLDL: 34 mg/dL (ref 0.0–40.0)

## 2022-07-11 LAB — VITAMIN D 25 HYDROXY (VIT D DEFICIENCY, FRACTURES): VITD: 46.82 ng/mL (ref 30.00–100.00)

## 2022-07-12 ENCOUNTER — Telehealth: Payer: Self-pay | Admitting: Family Medicine

## 2022-07-12 DIAGNOSIS — M545 Low back pain, unspecified: Secondary | ICD-10-CM

## 2022-07-12 MED ORDER — PREDNISONE 10 MG PO TABS: ORAL_TABLET | ORAL | 0 refills | Status: DC

## 2022-07-12 NOTE — Telephone Encounter (Signed)
Duly noted.  Short of trying opiates, and that would increase the risk of side effects, the only other option I can think of would be trying tylenol as needed in the meantime.  I hope she gets relief from her procedures in the near future.  Thanks.

## 2022-07-12 NOTE — Telephone Encounter (Signed)
Patient called asking if she would be able to move her appointment for the shoulder injections up to the first of the week next week? She is currently scheduled for Thursday, November 30th but is in a lot of pain.  Please advise. (Patient would like a call back today if possible.)

## 2022-07-12 NOTE — Telephone Encounter (Signed)
Patient notified as instructed by telephone and verbalized understanding.  Patient stated that her shoulder doctor told her to get some Voltaren cream and to use on her shoulders which has helped some. Patient stated that she has tylenol arthritis and will take that as needed. Patient stated that she appreciates Dr. Lianne Bushy advice.

## 2022-07-12 NOTE — Telephone Encounter (Signed)
I would try taking prednisone in the meantime to see if she can get at least a few days of relief.  I sent the prescription.  Do not take meloxicam ibuprofen or Aleve while she is taking prednisone.  When she does take prednisone, take it with food.  Her labs are stable and her cholesterol has improved.  I am attaching a note to her labs as soon as possible.  Thanks.

## 2022-07-12 NOTE — Telephone Encounter (Signed)
Noted. Thanks.

## 2022-07-12 NOTE — Telephone Encounter (Signed)
Patient notified as instructed by telephone and verbalized understanding. Patient stated that she is not going to take Prednisone now. Patient stated that she has had two rounds of prednisone recently and just does not want to take another round so soon. Patient stated that she is scheduled for hand surgery next week for carpal tunnel. Patient stated that she is also having shots put in her shoulder next week.

## 2022-07-12 NOTE — Telephone Encounter (Signed)
Pt called in stated she need a new RX for pain she can't take  RX meloxicam (MOBIC) 7.5 MG tablet  because it effect her BP . Please advise [phone]

## 2022-07-12 NOTE — Telephone Encounter (Signed)
Spoke to pt and discussed at great length the timing of her shoulder injections. Pt was advised that she was fine to keep her appointment for 11/30 and that possibly using Voltaren gel on her shoulders may be helpful. Pt verbalized understanding.

## 2022-07-20 ENCOUNTER — Other Ambulatory Visit: Payer: Self-pay | Admitting: Cardiovascular Disease

## 2022-07-20 ENCOUNTER — Ambulatory Visit: Payer: Medicare Other | Admitting: Family Medicine

## 2022-07-24 ENCOUNTER — Emergency Department
Admission: EM | Admit: 2022-07-24 | Discharge: 2022-07-24 | Disposition: A | Discharge status: Home / Self Care | Payer: Medicare Other | Attending: Emergency Medicine | Admitting: Emergency Medicine

## 2022-07-24 ENCOUNTER — Telehealth: Payer: Self-pay | Admitting: Family Medicine

## 2022-07-24 ENCOUNTER — Emergency Department: Payer: Medicare Other

## 2022-07-24 ENCOUNTER — Other Ambulatory Visit: Payer: Self-pay

## 2022-07-24 DIAGNOSIS — I2510 Atherosclerotic heart disease of native coronary artery without angina pectoris: Secondary | ICD-10-CM | POA: Diagnosis not present

## 2022-07-24 DIAGNOSIS — U071 COVID-19: Secondary | ICD-10-CM | POA: Diagnosis not present

## 2022-07-24 DIAGNOSIS — R0602 Shortness of breath: Secondary | ICD-10-CM | POA: Diagnosis present

## 2022-07-24 LAB — CBC
HCT: 39.1 % (ref 36.0–46.0)
Hemoglobin: 12.9 g/dL (ref 12.0–15.0)
MCH: 32.2 pg (ref 26.0–34.0)
MCHC: 33 g/dL (ref 30.0–36.0)
MCV: 97.5 fL (ref 80.0–100.0)
Platelets: 227 10*3/uL (ref 150–400)
RBC: 4.01 MIL/uL (ref 3.87–5.11)
RDW: 13.8 % (ref 11.5–15.5)
WBC: 6.5 10*3/uL (ref 4.0–10.5)
nRBC: 0 % (ref 0.0–0.2)

## 2022-07-24 LAB — BASIC METABOLIC PANEL
Anion gap: 8 (ref 5–15)
BUN: 11 mg/dL (ref 8–23)
CO2: 28 mmol/L (ref 22–32)
Calcium: 9.1 mg/dL (ref 8.9–10.3)
Chloride: 101 mmol/L (ref 98–111)
Creatinine, Ser: 0.74 mg/dL (ref 0.44–1.00)
GFR, Estimated: >60 mL/min (ref >60)
Glucose, Bld: 124 mg/dL — ABNORMAL HIGH (ref 70–99)
Potassium: 3.9 mmol/L (ref 3.5–5.1)
Sodium: 137 mmol/L (ref 135–145)

## 2022-07-24 LAB — SARS CORONAVIRUS 2 BY RT PCR: SARS Coronavirus 2 by RT PCR: POSITIVE — AB

## 2022-07-24 MED ORDER — SODIUM CHLORIDE 0.9 % IV BOLUS
1000.0000 mL | Freq: Once | INTRAVENOUS | Status: AC
  Administered 2022-07-24: 1000 mL via INTRAVENOUS

## 2022-07-24 NOTE — ED Triage Notes (Signed)
First Nurse Note:  Arrives from home via [facility].  TEsted positive for COVID one week ago.  Spo2 92% at home, PCP referred patient to ED for eval.  Patient c/o weakness and DOE.  91-92% on RA per EMS. Placed on 2l/ [place] sats improved to 96%.  Vs wnl.

## 2022-07-24 NOTE — Telephone Encounter (Signed)
Patient is currently at ED

## 2022-07-24 NOTE — ED Triage Notes (Signed)
Pt to ED [facility] from home for shob and generalized weakness. Tested positive for COVID one week ago.

## 2022-07-24 NOTE — Telephone Encounter (Signed)
Patient husband Steward Ros called in and stated that Robin Juarez recently had Covid and her pulse oxygen level had been reading low the highest been 94. While on the phone they checked it again and her blood pressure was 117/66 with pulse of 88. Sent over to access nurse.

## 2022-07-24 NOTE — ED Provider Triage Note (Signed)
Emergency Medicine Provider Triage Evaluation Note  Robin Juarez , a 88 y.o. female  was evaluated in triage.  Pt complains of generalized weakness x1 week, since diagnosed ith COVID. Was 92% on RA at home. No CP or leg swelling. No history of PE/DVT   Review of Systems  Positive: SOB, weakness Negative: CP  Physical Exam  BP (!) 109/55   Pulse 71   Temp 98.5 F (36.9 C)   Resp 20   Ht 5' (1.524 m)   Wt 81.6 kg   SpO2 93%   BMI 35.15 kg/m  Gen:   Awake, no distress   Resp:  Normal effort  MSK:   Moves extremities without difficulty  Other:    Medical Decision Making  Medically screening exam initiated at 3:30 PM.  Appropriate orders placed.  Robin Juarez was informed that the remainder of the evaluation will be completed by another provider, this initial triage assessment does not replace that evaluation, and the importance of remaining in the ED until their evaluation is complete.     Jackelyn Hoehn, PA-C 07/24/22 [id number]

## 2022-07-24 NOTE — Telephone Encounter (Signed)
If she needs to be seen today, can we add her on at 4:30?

## 2022-07-24 NOTE — Discharge Instructions (Signed)
   Thank you for choosing us for your health care today!  Please see your primary doctor this week for a follow up appointment.   If you do not have a primary doctor call the following clinics to establish care:  If you have insurance:  [facility] [phone] [address] [place] [address]   [facility]  [phone] [address] [place] [address]   If you do not have insurance:  [facility]  [phone] [address] [place] [address]  Sometimes, in the early stages of certain disease courses it is difficult to detect in the emergency department evaluation -- so, it is important that you continue to monitor your symptoms and call your doctor right away or return to the emergency department if you develop any new or worsening symptoms.  It was my pleasure to care for you today.   Herschel Fleagle S. Tichina Koebel, MD  

## 2022-07-24 NOTE — Telephone Encounter (Signed)
I tried calling pt and pts husband earlier and could not speak with anyone and then had an emergent pt to help that took a while and I see Dr Para March and Shanda Bumps CMA has addressed note. Sending note to Dr Para March and Para March pool.

## 2022-07-24 NOTE — ED Provider Notes (Signed)
[facility] Provider Note    Event Date/Time   First MD Initiated Contact with Patient 07/24/22 1831     (approximate)   History   Shortness of Breath   HPI  Robin Juarez is a 88 y.o. female   Past medical history of CAD, fibromyalgia, depression, arthritis, OSA, prior stroke who presents to the emergency department with 1 week of COVID-positive and upper respiratory viral infectious symptoms, myalgias, generalized weakness who was sent in by her home nurse for hypoxemia and low blood pressure today.  The patient has actually felt on the mend, improving, and has been taking ivermectin and a Z-Pak prescribed by her primary doctor.  She is COVID vaccinated x2.  He has had poor p.o. intake but denies nausea vomiting or diarrhea, denies fevers, denies chest pain or abdominal pain.    History was obtained via patient.   Her husband is present at bedside as an independent historian who states that he himself was diagnosed with COVID and has been suffering with the illness this week. I reviewed external medical notes including a telephone encounter dated 26 earlier today by medical assistant stating the oxygens running low between 92 to 95%     Physical Exam   Triage Vital Signs: ED Triage Vitals  Enc Vitals Group     BP 07/24/22 1522 (!) 109/55     Pulse Rate 07/24/22 1522 71     Resp 07/24/22 1522 20     Temp 07/24/22 1522 98.5 F (36.9 C)     Temp src --      SpO2 07/24/22 1522 93 %     Weight 07/24/22 1524 180 lb (81.6 kg)     Height 07/24/22 1524 5' (1.524 m)     Head Circumference --      Peak Flow --      Pain Score 07/24/22 1524 4     Pain Loc --      Pain Edu? --      Excl. in GC? --     Most recent vital signs: Vitals:   07/24/22 2130 07/24/22 2200  BP: (!) 127/54 (!) 143/77  Pulse: 65 72  Resp: 13 17  Temp:    SpO2: 95% 97%    General: Awake, no distress.  CV:  Appears mildly dehydrated with dry mucous membranes and poor  skin turgor. Resp:  Normal effort.  Abd:  No distention.  Other:  Abdomen soft and nontender lungs are clear to auscultation without wheezing rales or focalities.  Nontoxic-appearing alert and oriented and pleasant.   ED Results / Procedures / Treatments   Labs (all labs ordered are listed, but only abnormal results are displayed) Labs Reviewed  SARS CORONAVIRUS 2 BY RT PCR - Abnormal; Notable for the following components:      Result Value   SARS Coronavirus 2 by RT PCR POSITIVE (*)    All other components within normal limits  BASIC METABOLIC PANEL - Abnormal; Notable for the following components:   Glucose, Bld 124 (*)    All other components within normal limits  CBC     I reviewed labs and they are notable for COVID-positive.  White blood cell count within normal limits.  EKG  ED ECG REPORT I, Pilar Jarvis, the attending physician, personally viewed and interpreted this ECG.   Date: 07/24/2022  EKG Time: 1530  Rate: 70  Rhythm: normal sinus rhythm  Axis: nl  Intervals: None  ST&T Change: No acute  ischemic changes    RADIOLOGY I independently reviewed and interpreted chest x-ray and see no obvious focalities or pneumothorax   PROCEDURES:  Critical Care performed:  no  Procedures   MEDICATIONS ORDERED IN ED: Medications  sodium chloride 0.9 % bolus 1,000 mL (0 mLs Intravenous Stopped 07/24/22 2204)     IMPRESSION / MDM / ASSESSMENT AND PLAN / ED COURSE  I reviewed the triage vital signs and the nursing notes.                              Differential diagnosis includes, but is not limited to, COVID infection, viral URI, bacterial pneumonia, electrolyte disturbance, hypoxemia, dehydration or AKI considered but less likely PE or ACS or sepsis    MDM: Patient with known COVID and symptoms consistent with COVID that has actually been on the mend at home but sent in due to concerns of low blood pressure and hypoxemia.  Looks overall well, labs reassuring  no significant AKI and no bacterial pneumonia on chest x-ray.  She is breathing comfortably no respiratory distress.  Blood pressure is borderline low at 109/55 and she has evidence of mild dehydration so we will give IV crystalloid bolus and reassess blood pressure.  She is tolerating p.o. intake and can rehydrate at home.  We need to recheck her oxygen levels in the emergency department especially while ambulating.  Since blood pressure markedly improved, able to ambulate around the emergency department with no desaturations and had no dyspnea, feels well and would like to go home.  Discharged with PMD follow-up.  Patient's presentation is most consistent with acute presentation with potential threat to life or bodily function.       FINAL CLINICAL IMPRESSION(S) / ED DIAGNOSES   Final diagnoses:  COVID     Rx / DC Orders   ED Discharge Orders     None        Note:  This document was prepared using Dragon voice recognition software and may include unintentional dictation errors.    Pilar Jarvis, MD 07/24/22 2236

## 2022-07-24 NOTE — Telephone Encounter (Signed)
Call received from access nurse after triage was informed to contact providers office. I informed that we have nothing open in our office. Patient oxygen running between 92-95%. Advised that patient be seen at urgent care or walk in she will let patient know.

## 2022-07-24 NOTE — Telephone Encounter (Signed)
[facility] - Client TELEPHONE ADVICE RECORD AccessNurse Patient Name: Robin Juarez [facility] Gender: Female DOB: 08-Feb-1931 Age: 88 Y 1 M 4 D Return Phone Number: [phone] (Primary), [phone] (Secondary) Address: [place] [address] Client [facility] [facility] - Client Client Site [place] - Day Provider Renford Dills - MD Contact Type Call Who Is Calling Patient / Member / Family / Caregiver Call Type Triage / Clinical Relationship To Patient Self Return Phone Number [phone] (Primary) Chief Complaint Blood Pressure Low Reason for Call Symptomatic / Request for [place] states that she recently had Covid. Her O2 stat is 93. Her blood pressure is 118/64. This is a low bp reading for her. Translation No Nurse Assessment Nurse: Cox, RN, Holly Date/Time (Eastern Time): 07/24/2022 12:14:12 PM Confirm and document reason for call. If symptomatic, describe symptoms. ---Caller states that she recently had COVID and is still having cough. States she feels better since the nurse came to visit and gave her some medications. But that she is not feeling 100%. Does the patient have any new or worsening symptoms? ---Yes Will a triage be completed? ---Yes Related visit to physician within the last 2 weeks? ---Yes Does the PT have any chronic conditions? (i.e. diabetes, asthma, this includes High risk factors for pregnancy, etc.) ---Unknown Is this a behavioral health or substance abuse call? ---No Guidelines Guideline Title Affirmed Question Affirmed Notes Nurse Date/Time ([place] Time) COVID-19 - Diagnosed or Suspected Oxygen level (e.g., pulse oximetry) 91 to 94 percent Cox, RN, [facility] 07/24/2022 12:23:52 PM Disp. Time Eilene Ghazi Time) Disposition Final User 07/24/2022 12:37:33 PM Call PCP Now Yes Cox, RN, [facility] Final Disposition 07/24/2022  12:37:33 PM Call PCP Now Yes Cox, RN, Earnest Bailey PLEASE NOTE: All timestamps contained within this report are represented as [place] Standard Time. CONFIDENTIALTY NOTICE: This fax transmission is intended only for the addressee. It contains information that is legally privileged, confidential or otherwise protected from use or disclosure. If you are not the intended recipient, you are strictly prohibited from reviewing, disclosing, copying using or disseminating any of this information or taking any action in reliance on or regarding this information. If you have received this fax in error, please notify us immediately by telephone so that we can arrange for its return to [place]. Phone: [phone], Toll-Free: [phone], Fax: [phone] Page: 2 of 2 Call Id: [id number] Caller Disagree/Comply Disagree Caller Understands Yes PreDisposition Call Doctor Care Advice Given Per Guideline CALL PCP NOW: * I'll page the on-call provider now. If you haven't heard from the provider (or me) within 30 minutes, call again. * You need to discuss this with your doctor (or NP/PA). * Fever, Chills, and Sweats: For fever over 101 F (38.3 C), take acetaminophen every 4 to 6 hours (Adults 650 mg) OR ibuprofen every 6 to 8 hours (Adults 400 mg). Before taking any medicine, read all the instructions on the package. Do not take aspirin unless your doctor has prescribed it for you. Chills can sometimes come before a fever. You may feel cold in your hands and feet. You may have shivering. You may also feel sweaty as your body temperature goes down. * Muscle aches, headache, and other pains: Often this comes and goes with the fever. Take acetaminophen every 4 to 6 hours (Adults 650 mg) OR ibuprofen every 6 to 8 hours (Adults 400 mg). Before taking any medicine, read all the instructions on the package. *  HOME REMEDY - HONEY: This old home remedy has been shown to help decrease coughing at night. The adult dosage is 2 teaspoons  (10 ml) at bedtime. * 95 - 100%: Normal oxygen level. * 91 - 94%: Mildly low oxygen level for most people. It may be normal for some patients with COPD (emphysema) or other chronic lung conditions.. * Remove nail polish from the finger on which pulse oximetry is being performed. * Warm the hand prior to measurement. * Observe readings for 30 to 60 seconds. Identify the most common value. Only use readings that have a strong and regular pulse signal. * Perform the pulse oximetry while at rest, and during quiet breathing. * Perform the pulse oximetry indoors. Avoid bright light. * Measure and record values two to three times per day. CALL BACK IF: * You become worse CARE ADVICE given per COVID-19 - DIAGNOSED OR SUSPECTED (Adult) guideline. Comments User: Desma Paganini, RN Date/Time Lamount Cohen Time): 07/24/2022 12:39:31 PM Called backline for Call PCP NOW disposition, office staff states that they have no appointments available today and to direct patient/caller to UC/ED. Explained what the office staff had told RN to caller who stated "thats going to be a problem, because they are all backed up". Explained to caller that patient may need oxygen and should be evaluated. Referrals REFERRED TO PCP OFFICE Warm transfer to backlin

## 2022-07-25 ENCOUNTER — Telehealth: Payer: Self-pay

## 2022-07-25 ENCOUNTER — Ambulatory Visit: Payer: Medicare Other | Admitting: Cardiovascular Disease

## 2022-07-25 NOTE — Telephone Encounter (Signed)
Noted. Thanks.

## 2022-07-25 NOTE — Telephone Encounter (Signed)
Transition Care Management Follow-up Telephone Call Date of discharge and from where: [facility] ER 07-24-22 Dx: COVID How have you been since you were released from the hospital? Doing ok  Any questions or concerns? BP is running low and oxygen staying at 95%  Items Reviewed: Did the pt receive and understand the discharge instructions provided? Yes  Medications obtained and verified? Yes  Other? No  Any new allergies since your discharge? No  Dietary orders reviewed? Yes Do you have support at home? Yes   Home Care and Equipment/Supplies: Were home health services ordered? not applicable If so, what is the name of the agency? na  Has the agency set up a time to come to the patient's home? not applicable Were any new equipment or medical supplies ordered?  No What is the name of the medical supply agency? na Were you able to get the supplies/equipment? not applicable Do you have any questions related to the use of the equipment or supplies? No  Functional Questionnaire: (I = Independent and D = Dependent) ADLs: I  Bathing/Dressing- I  Meal Prep- I  Eating- I  Maintaining continence- I  Transferring/Ambulation- I  Managing Meds- I  Follow up appointments reviewed:  PCP Hospital f/u appt confirmed? Yes  [facility] No . Are transportation arrangements needed? No  If their condition worsens, is the pt aware to call PCP or go to the Emergency Dept.? Yes Was the patient provided with contact information for the PCP's office or ED? Yes Was to pt encouraged to call back with questions or concerns? Yes

## 2022-07-25 NOTE — Telephone Encounter (Signed)
Spoke with patients husband; patient was asleep when I called. He said she has felt bad all day and has been in the bed. Her O2 was up to 95% today and Bp was good when they checked it today. I advised him to let us know if they need anything further from [place]. Patient has f/u scheduled for 08/01/22.

## 2022-07-25 NOTE — Telephone Encounter (Signed)
Please get update on patient.  I saw the ER note.  We don't have IV fluids here at clinic, so I am glad she went in.  Thanks.

## 2022-07-26 ENCOUNTER — Telehealth: Payer: Self-pay | Admitting: Family Medicine

## 2022-07-26 NOTE — Telephone Encounter (Signed)
Noted. Thanks.

## 2022-07-26 NOTE — Telephone Encounter (Signed)
Pt's husband, Steward Ros requested a call back from [place] regarding getting Home Health Aid for pt. Steward Ros stated their insurance will cover the aid if Para March will sign off on it. Call back # [phone]

## 2022-07-26 NOTE — Telephone Encounter (Signed)
I put the same note in both charts (patient and spouse).  This is not going to get improved without a face-to-face visit dedicated to the issue.  Robin Juarez has a follow-up appointment pending.  I think we should talk about it all at that point.  Thanks. 

## 2022-07-27 NOTE — Telephone Encounter (Signed)
Spoke with Robin Juarez and advised her that they both will need a visit if [facility] is for both of them or whichever needs it. She states they really need this as meds are getting missed and [name] can not pick [name] up barely. They are having a really hard time since she is a fall risk; he is having to do everything and be with her 24/7 as he can not leave her. Daughter would really like to speak with Dr. Para March about off of this if he would call her.

## 2022-07-31 ENCOUNTER — Ambulatory Visit (INDEPENDENT_AMBULATORY_CARE_PROVIDER_SITE_OTHER): Payer: Medicare Other | Admitting: Family Medicine

## 2022-07-31 ENCOUNTER — Encounter: Payer: Self-pay | Admitting: Family Medicine

## 2022-07-31 ENCOUNTER — Ambulatory Visit: Payer: Medicare Other | Admitting: Family Medicine

## 2022-07-31 VITALS — BP 120/66 | HR 85 | Temp 98.0°F

## 2022-07-31 DIAGNOSIS — U071 COVID-19: Secondary | ICD-10-CM | POA: Diagnosis not present

## 2022-07-31 DIAGNOSIS — F339 Major depressive disorder, recurrent, unspecified: Secondary | ICD-10-CM

## 2022-07-31 NOTE — Patient Instructions (Addendum)
Let me check with Henreitta Leber about med options.  Consider home health PT/OT after carpal tunnel surgery.  Take care.  Glad to see you.

## 2022-07-31 NOTE — Progress Notes (Unsigned)
Recheck pulse ox 93%.  Her birthday is June 18, 1931. She is 89.  Her birthday is listed as 06/19/1932 in the EMR.  Discussed.  I will check with staff about this.  Mood is worse, crying.  She feels worse in general.  Has used baclofen for pain.  She took prednisone with some relief of shoulder and hand pain.  Mood changes above may have been related to prednisone use, chronic pain, and recent illness.  She is going to have her R carpal tunnel surgery on 08/11/22.    Covid positive 1 week ago.  She had been sick for about 1 week prior to that.  She was prev given ivermectin, d/w pt.   She is deconditioned from recent covid infection.  Discussed that there was no good evidence to support the use of ivermectin for COVID infection and I would not recommend taking that medication as such in the future.  Some sputum. She is using nasal rinse.    Discussed that she had been seen by nurse, Dewitt Hoes, at home.  I do not have any information about her and her service.  Discussed.  Meds, vitals, and allergies reviewed.   ROS: Per HPI unless specifically indicated in ROS section   Nad Ncat Neck supple, no LA Rrr Ctab Abd soft, not ttp Ext well perfused.   30 minutes were devoted to patient care in this encounter (this includes time spent reviewing the patient's file/history, interviewing and examining the patient, counseling/reviewing plan with patient).

## 2022-08-01 ENCOUNTER — Other Ambulatory Visit: Payer: Self-pay | Admitting: Pharmacist Clinician (PhC)/ Clinical Pharmacy Specialist

## 2022-08-01 ENCOUNTER — Telehealth: Payer: Self-pay

## 2022-08-01 ENCOUNTER — Inpatient Hospital Stay: Payer: Medicare Other | Admitting: Family Medicine

## 2022-08-01 MED ORDER — ROSUVASTATIN CALCIUM 10 MG PO TABS: ORAL_TABLET | ORAL | 3 refills | Status: DC

## 2022-08-01 NOTE — Telephone Encounter (Signed)
Husband called regarding cholesterol medication.  Please call to discuss.

## 2022-08-02 ENCOUNTER — Telehealth: Payer: Self-pay | Admitting: Family Medicine

## 2022-08-02 DIAGNOSIS — U071 COVID-19: Secondary | ICD-10-CM | POA: Insufficient documentation

## 2022-08-02 NOTE — Assessment & Plan Note (Signed)
Discussed options.  We talked about mood and prev hx.  She had talked with psych clinic about not using BZD for anxiety.  Discussed using buspar prn but that didn't help a lot and she didn't feel well taking it.  I need input from Oneta Rack at [place]- specifically about options for anxiety treatment.  We agreed not to change her medications otherwise in the meantime but get input from psychiatry.

## 2022-08-02 NOTE — Assessment & Plan Note (Signed)
She was given IV fluids in the emergency room.  That helped.  Her lungs are clear and she still okay for outpatient follow-up but she is deconditioned.  Discussed not using ivermectin for COVID.

## 2022-08-02 NOTE — Telephone Encounter (Signed)
Please get input from [place].  Patient had talked with psych clinic about not using BZD for anxiety.  Discussed using buspar prn but that didn't help a lot and she didn't feel well taking it.  I need input from Oneta Rack at [place]- specifically about options for anxiety treatment.  We agreed not to change her medications otherwise in the meantime but get input from psychiatry.  Thanks.

## 2022-08-03 NOTE — Telephone Encounter (Signed)
Noted. Thanks. Given that, I'll defer.

## 2022-08-03 NOTE — Telephone Encounter (Signed)
[facility] counseling center and [facility] on their VM.

## 2022-08-03 NOTE — Telephone Encounter (Signed)
Oneta Rack called back and She stated that she saw patient yesterday and patient is taking the full dose of Buspar now. She spoke with her again yesterday afternoon and patient told her she was doing well. Melina Schools states she will be out of the office until Monday starting now if you need to call back about anything she will not be there til next week.

## 2022-08-08 ENCOUNTER — Inpatient Hospital Stay: Payer: Medicare Other | Admitting: Family Medicine

## 2022-08-10 ENCOUNTER — Encounter: Payer: Medicare Other | Admitting: Internal Medicine

## 2022-08-21 HISTORY — PX: CARPAL TUNNEL RELEASE: SHX101

## 2022-08-31 ENCOUNTER — Telehealth: Payer: Self-pay | Admitting: *Deleted

## 2022-08-31 ENCOUNTER — Ambulatory Visit (INDEPENDENT_AMBULATORY_CARE_PROVIDER_SITE_OTHER): Payer: Medicare Other | Admitting: Family Medicine

## 2022-08-31 ENCOUNTER — Ambulatory Visit: Payer: Self-pay

## 2022-08-31 VITALS — BP 110/84 | Ht 60.0 in | Wt 188.0 lb

## 2022-08-31 DIAGNOSIS — M25511 Pain in right shoulder: Secondary | ICD-10-CM

## 2022-08-31 DIAGNOSIS — M25512 Pain in left shoulder: Secondary | ICD-10-CM | POA: Diagnosis not present

## 2022-08-31 DIAGNOSIS — M791 Myalgia, unspecified site: Secondary | ICD-10-CM | POA: Diagnosis not present

## 2022-08-31 DIAGNOSIS — G8929 Other chronic pain: Secondary | ICD-10-CM

## 2022-08-31 LAB — CK: Total CK: 59 U/L (ref 7–177)

## 2022-08-31 LAB — C-REACTIVE PROTEIN: CRP: <1 mg/dL (ref 0.5–20.0)

## 2022-08-31 NOTE — Telephone Encounter (Signed)
Primary Cardiologist:Jonathan Gwenlyn Found, MD   Preoperative team, please contact this patient and set up a phone call appointment or clearance can be addressed at office visit on 10/10/22 for further preoperative risk assessment. Please obtain consent and complete medication review. Thank you for your help.   She is cleared to hold aspirin from a cardiac perspective, but given history of CVA, requesting provider should seek additional clearance from neurology or PCP, who ever prescribes aspirin for her.    Emmaline Life, NP-C  08/31/2022, 1:06 PM [address] Office [phone] Fax [phone]

## 2022-08-31 NOTE — Progress Notes (Signed)
I, Peterson Lombard, LAT, ATC acting as a scribe for Lynne Leader, MD.  Robin Juarez is a 89 y.o. female who presents to [place] at [facility] today for continued chronic bilateral shoulder pain.  Patient was last seen by Dr. Georgina Snell on 01/30/2022 and was given bilateral glenohumeral steroid injections. patient was seen by Dr. Rolena Infante on 05/01/2022 and was given a right glenohumeral steroid injection.  Today, patient reports continued bilateral shoulder pain.  She notes pain across both shoulders and thoracic/trapezius area treated like a cape.  Her primary care provider prescribed prednisone which worked immediately well for this while she was taking it.  She denies any fevers or chills.  She notes that she has an appointment to see rheumatology Dr. Benjamine Mola scheduled on March 20.  Does have some hand pain as well.  Dx imaging: 11/15/21 R & L shoulder XR   Pertinent review of systems: No fevers or chills.  Positive for myalgias and arthralgias.  Relevant historical information: Hyperlipidemia.  Osteoporosis.  Concern rheumatism   Exam:  BP 110/84   Ht 5' (1.524 m)   Wt 188 lb (85.3 kg)   BMI 36.72 kg/m  General: Well Developed, well nourished, and in no acute distress.   MSK: Bilateral shoulders nontender decreased range of motion pain with abduction.  Abduction strength is diminished 4/5.    Lab and Radiology Results  Procedure: Real-time Ultrasound Guided Injection of right shoulder glenohumeral joint posterior approach Device: Philips Affiniti 50G Images permanently stored and available for review in PACS Verbal informed consent obtained.  Discussed risks and benefits of procedure. Warned about infection, bleeding, hyperglycemia damage to structures among others. Patient expresses understanding and agreement Time-out conducted.   Noted no overlying erythema, induration, or other signs of local infection.   Skin prepped in a sterile fashion.   Local anesthesia:  Topical Ethyl chloride.   With sterile technique and under real time ultrasound guidance: 40 mg of Kenalog and 2 mL of Marcaine injected into glenohumeral joint. Fluid seen entering the joint capsule.   Completed without difficulty   Pain immediately resolved suggesting accurate placement of the medication.   Advised to call if fevers/chills, erythema, induration, drainage, or persistent bleeding.   Images permanently stored and available for review in the ultrasound unit.  Impression: Technically successful ultrasound guided injection.    Procedure: Real-time Ultrasound Guided Injection of left shoulder glenohumeral joint posterior approach Device: Philips Affiniti 50G Images permanently stored and available for review in PACS Verbal informed consent obtained.  Discussed risks and benefits of procedure. Warned about infection, bleeding, hyperglycemia damage to structures among others. Patient expresses understanding and agreement Time-out conducted.   Noted no overlying erythema, induration, or other signs of local infection.   Skin prepped in a sterile fashion.   Local anesthesia: Topical Ethyl chloride.   With sterile technique and under real time ultrasound guidance: 40 mg of Kenalog and 2 mL of Marcaine injected into glenohumeral joint. Fluid seen entering the joint capsule.   Completed without difficulty   Pain immediately resolved suggesting accurate placement of the medication.   Advised to call if fevers/chills, erythema, induration, drainage, or persistent bleeding.   Images permanently stored and available for review in the ultrasound unit.  Impression: Technically successful ultrasound guided injection.     Assessment and Plan: 89 y.o. female with bilateral shoulder pain.  Shoulder pain thought to be predominantly due to DJD.  She is done pretty well with intermittent intra-articular steroid injections.  Plan for repeat steroid injection today.  However she has proximal  upper extremity shoulder girdle pain that responded really well to oral steroids.  This is somewhat concerning for polymyalgia rheumatica.  Plan for a limited rheumatologic workup today.  She is already scheduled to see a rheumatologist on March 20 so starting that workup now should be helpful.  If there is enough abnormally positive labs to be suspicious certainly I can discuss the plan with Dr. Benjamine Mola and we can come up with some early treatment or even refine the diagnosis prior to the visit.   Check as needed.  We can repeat the steroid injections every 3 months.  PDMP not reviewed this encounter. Orders Placed This Encounter  Procedures   [place] LIMITED JOINT SPACE STRUCTURES UP BILAT(NO LINKED CHARGES)    Order Specific Question:   Reason for Exam (SYMPTOM  OR DIAGNOSIS REQUIRED)    Answer:   bilateral shoulder pain    Order Specific Question:   Preferred imaging location?    Answer:   [facility]   Sedimentation rate    Standing Status:   Future    Number of Occurrences:   1    Standing Expiration Date:   09/01/2023   C-reactive protein   Rheumatoid factor    Standing Status:   Future    Number of Occurrences:   1    Standing Expiration Date:   2/83/1517   Cyclic citrul peptide antibody, IgG    Standing Status:   Future    Number of Occurrences:   1    Standing Expiration Date:   09/01/2023   ANA    Standing Status:   Future    Number of Occurrences:   1    Standing Expiration Date:   09/01/2023   CK    Standing Status:   Future    Number of Occurrences:   1    Standing Expiration Date:   09/01/2023   No orders of the defined types were placed in this encounter.    Discussed warning signs or symptoms. Please see discharge instructions. Patient expresses understanding.   The above documentation has been reviewed and is accurate and complete Lynne Leader, M.D.

## 2022-08-31 NOTE — Patient Instructions (Signed)
Thank you for coming in today.   Please get labs today before you leave   Call or go to the ER if you develop a large red swollen joint with extreme pain or oozing puss.    You can do the injection again in about 3 months which would be early April.   The labs will come in over this next week.

## 2022-08-31 NOTE — Telephone Encounter (Signed)
   Pre-operative Risk Assessment    Patient Name: Robin Juarez  DOB: [birth year] MRN: [id number]      Request for Surgical Clearance    Procedure:   LEFT CARPAL TUNNEL RELEASE   Date of Surgery:  Clearance TBD                                 Surgeon:  DR. CHARLES BENFIELD Surgeon's Group or Practice Name:  [facility] Phone number:  [phone] ATTN: Dane Fax number:  [phone]   Type of Clearance Requested:   - Medical ; ASA    Type of Anesthesia:  MAC WITH LOCAL   Additional requests/questions:    Jiles Prows   08/31/2022, 12:17 PM

## 2022-09-01 NOTE — Telephone Encounter (Signed)
I s/w the pt about pre op clearance appt. Pt tells me that she has been having some skipped beats and racing heart beats. I then scheduled the pt to come in sooner, an appt with Dr. Gwenlyn Found 09/05/22 @ 2 pm. Pt thanked me for the call and the help today. She tells me that in regard to her surgery, the surgeon can perform the surgery right in his office as well. She is grateful for the sooner appt. We have agreed to not cancel the 09/2022, as DrMarland Kitchen Gwenlyn Found may choose to f/u with the pt still at that time.  Pt has been advised if she is feeling worse over the weekend, I advised she go to to the ED. Pt agreeable to plan of care.

## 2022-09-03 LAB — ANTI-NUCLEAR AB-TITER (ANA TITER)
ANA TITER: 1:40 {titer} — ABNORMAL HIGH
ANA Titer 1: 1:80 {titer} — ABNORMAL HIGH

## 2022-09-03 LAB — RHEUMATOID FACTOR: Rheumatoid fact SerPl-aCnc: <14 IU/mL (ref <14)

## 2022-09-03 LAB — ANA: Anti Nuclear Antibody (ANA): POSITIVE — AB

## 2022-09-03 LAB — CYCLIC CITRUL PEPTIDE ANTIBODY, IGG: Cyclic Citrullin Peptide Ab: <16 UNITS

## 2022-09-05 ENCOUNTER — Ambulatory Visit: Payer: Medicare Other | Attending: Cardiovascular Disease | Admitting: Cardiovascular Disease

## 2022-09-05 ENCOUNTER — Encounter: Payer: Self-pay | Admitting: Cardiovascular Disease

## 2022-09-05 VITALS — BP 128/70 | HR 83 | Ht 60.0 in | Wt 182.0 lb

## 2022-09-05 DIAGNOSIS — I2510 Atherosclerotic heart disease of native coronary artery without angina pectoris: Secondary | ICD-10-CM | POA: Diagnosis not present

## 2022-09-05 DIAGNOSIS — I10 Essential (primary) hypertension: Secondary | ICD-10-CM

## 2022-09-05 DIAGNOSIS — R002 Palpitations: Secondary | ICD-10-CM | POA: Diagnosis not present

## 2022-09-05 DIAGNOSIS — E782 Mixed hyperlipidemia: Secondary | ICD-10-CM

## 2022-09-05 DIAGNOSIS — I350 Nonrheumatic aortic (valve) stenosis: Secondary | ICD-10-CM | POA: Insufficient documentation

## 2022-09-05 NOTE — Progress Notes (Signed)
09/05/2022 Robin Juarez   05-03-1931  [id number]  Primary Physician Robin Ghent, MD Primary Cardiologist: Robin Harp MD Robin Juarez, [place]  HPI:  Robin Juarez is a 89 y.o.   moderately overweight married Caucasian female mother of 4 children, grandmother of 42 grandchildren who self-referred for evaluation of chest pain. I last saw her in the office 09/21/2020.she is accompanied by her husband Robin Juarez today.  Her primary care physician is Dr. Elsie Juarez. She took up in by one of her daughters Robin Juarez who I know. Her cardiac risk factors are notable for family history with brother does have coronary artery bypass grafting. She probably does have hyperlipidemia and hypertension which is not treated. She began having chest pain 2 weeks ago which is left precordial and radiates to her back. There are no other associated symptoms. She also initially had strokelike symptoms and had an MRI that showed a subacute stroke in the corona radiata. She had a Myoview stress test that was low risk but that show subtle anteroapical ischemia. I performed cardiac catheterization on her 09/03/14 through the right femoral approach revealing at most 50% mid LAD stenosis after a moderate sized diagonal branch with otherwise normal coronary arteries and normal LV function. I thought her chest pain was noncardiac. Since I saw her in the office a year ago she's remained clinically stable until recently when she was admitted to [facility] on 06/13/17 for chest pain. She ruled out for myocardial infarction. Her echo was normal with grade 1 diastolic dysfunction. A CT FFR suggested disease in the IV territory which led to a cardiac catheterization by Dr. Claiborne Billings the same day revealing noncritical CAD.   Since I saw her  in the office 2 years ago she continues to do well.  She apparently had COVID 6 weeks ago and she is just getting over this.  She has had some recurrent palpitations.  She  denies chest pain or shortness of breath.  Current Meds  Medication Sig   amLODipine (NORVASC) 2.5 MG tablet TAKE 1 TABLET BY MOUTH DAILY   aspirin EC 81 MG tablet Take 1 tablet (81 mg total) by mouth daily. Swallow whole.   Baclofen 5 MG TABS TAKE 1 TABLET (5 MG) BY MOUTH  TWICE DAILY AS NEEDED (FOR  MUSCLE SPASMS).   busPIRone (BUSPAR) 10 MG tablet as needed.   Cholecalciferol (VITAMIN D) 50 MCG (2000 UT) CAPS Take 2 capsules (4,000 Units total) by mouth daily.   Coenzyme Q10 (CO Q 10 PO) Take 1 tablet by mouth daily.   DULoxetine (CYMBALTA) 30 MG capsule Take 90 mg by mouth daily.    folic acid (FOLVITE) 154 MCG tablet Take 800 mcg by mouth daily.    gabapentin (NEURONTIN) 300 MG capsule 1 tab noon, 1 tab evening, 2 tabs bedtime (4 tabs total)   KRILL OIL PO Take 100 mg by mouth daily.   lamoTRIgine (LAMICTAL) 100 MG tablet Take 1 tablet (100 mg total) by mouth daily after supper.   metoprolol succinate (TOPROL-XL) 25 MG 24 hr tablet TAKE 1 TABLET BY MOUTH ONCE  DAILY   OLANZapine (ZYPREXA) 2.5 MG tablet Take 1 tablet (2.5 mg total) by mouth at bedtime.   rosuvastatin (CRESTOR) 10 MG tablet Take 1 tablet by mouth three days per week (MWF)   [DISCONTINUED] predniSONE (DELTASONE) 10 MG tablet Take 2 a day for 5 days, then 1 a day for 5 days, with food. Don't take  with aleve/ibuprofen/meloxicam     Allergies  Allergen Reactions   Digoxin Other (See Comments)    Felt weak, tired, "awful"   Nitrofurantoin Other (See Comments)    Sharp pain back of head   Alendronate Sodium Other (See Comments)    cramps   Cephalosporins     She "felt bad" while taking multiple cephalosporins but has been able tolerate amoxicillin previously.  No clear cephalosporin allergy.   Chlordiazepoxide Hcl Other (See Comments)   Cyclobenzaprine     Other reaction(s): caused patient to panic   Lisinopril Itching and Swelling   Phenobarbital-Belladonna Alk Other (See Comments)   Praluent [Alirocumab]      myalgia   Repatha [Evolocumab]     myalgias   Ritalin [Methylphenidate Hcl]     intolerant   Statins     Myalgia.  Intolerant of multiple statins, including pravastatin and simvastatin.   Trimethoprim     Wheezing lip swelling   Chlordiazepoxide Other (See Comments)   Clavulanic Acid Other (See Comments)    Not an allergy.  Tolerates plain amoxil but not augmentin- diarrhea, etc with augmentin   Omeprazole Rash    rash   Oxcarbazepine Other (See Comments)    unknown   Sulfa Antibiotics Other (See Comments)    Sharp pain in back of head   Tetracycline Itching    itching   Zetia [Ezetimibe] Other (See Comments)    possible cause of aches and abnormal dreams, stopped 05/2014    Social History   Socioeconomic History   Marital status: Married    Spouse name: Not on file   Number of children: 4   Years of education: Not on file   Highest education level: Not on file  Occupational History   Occupation: RETIRED    Employer: RETIRED  Tobacco Use   Smoking status: Never   Smokeless tobacco: Never   Tobacco comments:    a small amount when 89 yo  Vaping Use   Vaping Use: Never used  Substance and Sexual Activity   Alcohol use: No    Alcohol/week: 0.0 standard drinks of alcohol   Drug use: No   Sexual activity: Not Currently  Other Topics Concern   Not on file  Social History Narrative   Married 1953, husband is well.   5 pregnancies, 4 live births, all well (5th was unexpect pregnancy and patient needed to terminate due to concurrent medical problems at the time)   11 grandchildren.  9 great grandchildren   Regular exercise:  No   Social Determinants of Health   Financial Resource Strain: Low Risk  (12/28/2021)   Overall Financial Resource Strain (CARDIA)    Difficulty of Paying Living Expenses: Not hard at all  Food Insecurity: No Food Insecurity (12/28/2021)   Hunger Vital Sign    Worried About Running Out of Food in the Last Year: Never true    Ran Out of Food in  the Last Year: Never true  Transportation Needs: No Transportation Needs (12/28/2021)   PRAPARE - Administrator, Civil Service (Medical): No    Lack of Transportation (Non-Medical): No  Physical Activity: Inactive (05/18/2020)   Exercise Vital Sign    Days of Exercise per Week: 0 days    Minutes of Exercise per Session: 0 min  Stress: No Stress Concern Present (12/28/2021)   [employer] of Occupational Health - Occupational Stress Questionnaire    Feeling of Stress : Not at all  Social Connections: Moderately  Integrated (12/28/2021)   Social Connection and Isolation Panel [NHANES]    Frequency of Communication with Friends and Family: More than three times a week    Frequency of Social Gatherings with Friends and Family: Once a week    Attends Religious Services: More than 4 times per year    Active Member of [employer] or Organizations: No    Attends Archivist Meetings: Never    Marital Status: Married  Human resources officer Violence: Not At Risk (12/28/2021)   Humiliation, Afraid, Rape, and Kick questionnaire    Fear of Current or Ex-Partner: No    Emotionally Abused: No    Physically Abused: No    Sexually Abused: No     Review of Systems: General: negative for chills, fever, night sweats or weight changes.  Cardiovascular: negative for chest pain, dyspnea on exertion, edema, orthopnea, palpitations, paroxysmal nocturnal dyspnea or shortness of breath Dermatological: negative for rash Respiratory: negative for cough or wheezing Urologic: negative for hematuria Abdominal: negative for nausea, vomiting, diarrhea, bright red blood per rectum, melena, or hematemesis Neurologic: negative for visual changes, syncope, or dizziness All other systems reviewed and are otherwise negative except as noted above.    Blood pressure 128/70, pulse 83, height 5' (1.524 m), weight 182 lb (82.6 kg).  General appearance: alert and no distress Neck: no adenopathy, no carotid  bruit, no JVD, supple, symmetrical, trachea midline, and thyroid not enlarged, symmetric, no tenderness/mass/nodules Lungs: clear to auscultation bilaterally Heart: regular rate and rhythm, S1, S2 normal, no murmur, click, rub or gallop Extremities: extremities normal, atraumatic, no cyanosis or edema Pulses: 2+ and symmetric Skin: Skin color, texture, turgor normal. No rashes or lesions Neurologic: Grossly normal  EKG sinus rhythm at 83 with LVH voltage and poor R wave progression.  Personally reviewed this EKG.  ASSESSMENT AND PLAN:   HLD (hyperlipidemia) History of hyperlipidemia on statin therapy lipid profile performed 07/11/2022 revealing total cholesterol 172, LDL 76 and HDL 61.  Essential hypertension History of essential hypertension a blood pressure measured today at 128/70.  She is on amlodipine and metoprolol.  Coronary artery disease History of mild coronary disease by cardiac catheterization which I performed 09/03/2014.  This was again performed by Dr. Claiborne Billings in 2018 which showed no obstructive disease.  She denies chest pain.  Palpitations History of occasional events which have gotten worse since she had COVID 6 weeks ago.  She did have an event monitor performed a year ago that showed brief episode of SVT.  She was told that she could take an extra metoprolol if she had recurrent palpitation.  Aortic stenosis, mild 2D echo performed 11/17/2021 showed normal LV systolic function with mild aortic stenosis.  Her valve area was 1.55 cm with a mean gradient of 8 mmHg.  This will be repeated on an annual basis.     Robin Harp MD FACP,FACC,FAHA, [facility] 09/05/2022 2:33 PM

## 2022-09-05 NOTE — Assessment & Plan Note (Signed)
History of essential hypertension a blood pressure measured today at 128/70.  She is on amlodipine and metoprolol.

## 2022-09-05 NOTE — Assessment & Plan Note (Signed)
History of mild coronary disease by cardiac catheterization which I performed 09/03/2014.  This was again performed by Dr. Claiborne Billings in 2018 which showed no obstructive disease.  She denies chest pain.

## 2022-09-05 NOTE — Patient Instructions (Signed)
  Testing/Procedures: Echocardiogram: Your physician has requested that you have an echocardiogram in March. Echocardiography is a painless test that uses sound waves to create images of your heart. It provides your doctor with information about the size and shape of your heart and how well your heart's chambers and valves are working. This procedure takes approximately one hour. There are no restrictions for this procedure. Please do NOT wear cologne, perfume, aftershave, or lotions (deodorant is allowed). Please arrive 15 minutes prior to your appointment time.   Follow-Up: At [facility], you and your health needs are our priority.  As part of our continuing mission to provide you with exceptional heart care, we have created designated Provider Care Teams.  These Care Teams include your primary Cardiologist (physician) and Advanced Practice Providers (APPs -  Physician Assistants and Nurse Practitioners) who all work together to provide you with the care you need, when you need it.  We recommend signing up for the patient portal called "MyChart".  Sign up information is provided on this After Visit Summary.  MyChart is used to connect with patients for Virtual Visits (Telemedicine).  Patients are able to view lab/test results, encounter notes, upcoming appointments, etc.  Non-urgent messages can be sent to your provider as well.   To learn more about what you can do with MyChart, go to [email or web address].    Your next appointment:   6 months with Isaac Laud Meng,PA 12 months with Dr Gwenlyn Found

## 2022-09-05 NOTE — Assessment & Plan Note (Signed)
History of occasional events which have gotten worse since she had COVID 6 weeks ago.  She did have an event monitor performed a year ago that showed brief episode of SVT.  She was told that she could take an extra metoprolol if she had recurrent palpitation.

## 2022-09-05 NOTE — Assessment & Plan Note (Signed)
History of hyperlipidemia on statin therapy lipid profile performed 07/11/2022 revealing total cholesterol 172, LDL 76 and HDL 61.

## 2022-09-05 NOTE — Assessment & Plan Note (Signed)
2D echo performed 11/17/2021 showed normal LV systolic function with mild aortic stenosis.  Her valve area was 1.55 cm with a mean gradient of 8 mmHg.  This will be repeated on an annual basis.

## 2022-09-07 NOTE — Progress Notes (Signed)
ANA is just a little bit elevated.  It looks like there was a lab error and one of the labs was never done correctly.  Unfortunately there is probably the most important lab to get done.  Do think it is possible to get your blood drawn?

## 2022-09-08 ENCOUNTER — Other Ambulatory Visit (INDEPENDENT_AMBULATORY_CARE_PROVIDER_SITE_OTHER): Payer: Medicare Other

## 2022-09-08 ENCOUNTER — Encounter: Payer: Self-pay | Admitting: Internal Medicine

## 2022-09-08 ENCOUNTER — Telehealth: Payer: Self-pay | Admitting: Family Medicine

## 2022-09-08 ENCOUNTER — Ambulatory Visit: Payer: Medicare Other | Admitting: Internal Medicine

## 2022-09-08 VITALS — BP 130/74 | HR 66 | Temp 97.3°F | Ht 60.0 in | Wt 186.0 lb

## 2022-09-08 DIAGNOSIS — G8929 Other chronic pain: Secondary | ICD-10-CM

## 2022-09-08 DIAGNOSIS — M791 Myalgia, unspecified site: Secondary | ICD-10-CM | POA: Diagnosis not present

## 2022-09-08 DIAGNOSIS — R300 Dysuria: Secondary | ICD-10-CM

## 2022-09-08 DIAGNOSIS — M25512 Pain in left shoulder: Secondary | ICD-10-CM

## 2022-09-08 DIAGNOSIS — N3000 Acute cystitis without hematuria: Secondary | ICD-10-CM

## 2022-09-08 DIAGNOSIS — M25511 Pain in right shoulder: Secondary | ICD-10-CM | POA: Diagnosis not present

## 2022-09-08 LAB — POC URINALSYSI DIPSTICK (AUTOMATED)
Bilirubin, UA: NEGATIVE
Blood, UA: NEGATIVE
Glucose, UA: NEGATIVE
Ketones, UA: NEGATIVE
Nitrite, UA: NEGATIVE
Protein, UA: NEGATIVE
Spec Grav, UA: 1.01 (ref 1.010–1.025)
Urobilinogen, UA: 0.2 E.U./dL
pH, UA: 7 (ref 5.0–8.0)

## 2022-09-08 LAB — SEDIMENTATION RATE: Sed Rate: 23 mm/hr (ref 0–30)

## 2022-09-08 MED ORDER — CIPROFLOXACIN HCL 250 MG PO TABS: 250.0000 mg | ORAL_TABLET | Freq: Two times a day (BID) | ORAL | 0 refills | Status: AC

## 2022-09-08 MED ORDER — AMOXICILLIN 875 MG PO TABS: 875.0000 mg | ORAL_TABLET | Freq: Two times a day (BID) | ORAL | 0 refills | Status: AC

## 2022-09-08 NOTE — Telephone Encounter (Signed)
I sent the rx for amoxil in the meantime.  Thanks.

## 2022-09-08 NOTE — Addendum Note (Signed)
Addended by: Pilar Grammes on: 09/08/2022 11:31 AM   Modules accepted: Orders

## 2022-09-08 NOTE — Addendum Note (Signed)
Addended by: Tonia Ghent on: 09/08/2022 05:08 PM   Modules accepted: Orders

## 2022-09-08 NOTE — Assessment & Plan Note (Signed)
Sig leukocytes on urinalysis but no nitrites Symptoms are fairly classic Multiple allergies --never had nitrofurantoin Will give cipro 250 bid x 3 days---she has tolerated this multiple times Send culture

## 2022-09-08 NOTE — Progress Notes (Signed)
Subjective:    Patient ID: Robin Juarez, female    DOB: 10-09-1930, 89 y.o.   MRN: [id number]  HPI Here due to urinary symptoms  Thinks she has a UTI Has had burning and stinging--though some better Started 2 days ago No blood No fever  Took azo yesterday--some help Trying to drink more  Current Outpatient Medications on File Prior to Visit  Medication Sig Dispense Refill   amLODipine (NORVASC) 2.5 MG tablet TAKE 1 TABLET BY MOUTH DAILY 90 tablet 2   aspirin EC 81 MG tablet Take 1 tablet (81 mg total) by mouth daily. Swallow whole. 30 tablet 12   Baclofen 5 MG TABS TAKE 1 TABLET (5 MG) BY MOUTH  TWICE DAILY AS NEEDED (FOR  MUSCLE SPASMS). 90 tablet 0   busPIRone (BUSPAR) 10 MG tablet as needed.     Cholecalciferol (VITAMIN D) 50 MCG (2000 UT) CAPS Take 2 capsules (4,000 Units total) by mouth daily.     Coenzyme Q10 (CO Q 10 PO) Take 1 tablet by mouth daily.     DULoxetine (CYMBALTA) 30 MG capsule Take 90 mg by mouth daily.      folic acid (FOLVITE) 998 MCG tablet Take 800 mcg by mouth daily.      gabapentin (NEURONTIN) 300 MG capsule 1 tab noon, 1 tab evening, 2 tabs bedtime (4 tabs total)     KRILL OIL PO Take 100 mg by mouth daily.     lamoTRIgine (LAMICTAL) 100 MG tablet Take 1 tablet (100 mg total) by mouth daily after supper.     metoprolol succinate (TOPROL-XL) 25 MG 24 hr tablet TAKE 1 TABLET BY MOUTH ONCE  DAILY 90 tablet 3   OLANZapine (ZYPREXA) 2.5 MG tablet Take 1 tablet (2.5 mg total) by mouth at bedtime.     rosuvastatin (CRESTOR) 10 MG tablet Take 1 tablet by mouth three days per week (MWF) 36 tablet 3   No current facility-administered medications on file prior to visit.    Allergies  Allergen Reactions   Digoxin Other (See Comments)    Felt weak, tired, "awful"   Nitrofurantoin Other (See Comments)    Sharp pain back of head   Alendronate Sodium Other (See Comments)    cramps   Cephalosporins     She "felt bad" while taking multiple cephalosporins  but has been able tolerate amoxicillin previously.  No clear cephalosporin allergy.   Chlordiazepoxide Hcl Other (See Comments)   Cyclobenzaprine     Other reaction(s): caused patient to panic   Lisinopril Itching and Swelling   Phenobarbital-Belladonna Alk Other (See Comments)   Praluent [Alirocumab]     myalgia   Repatha [Evolocumab]     myalgias   Ritalin [Methylphenidate Hcl]     intolerant   Statins     Myalgia.  Intolerant of multiple statins, including pravastatin and simvastatin.   Trimethoprim     Wheezing lip swelling   Chlordiazepoxide Other (See Comments)   Clavulanic Acid Other (See Comments)    Not an allergy.  Tolerates plain amoxil but not augmentin- diarrhea, etc with augmentin   Omeprazole Rash    rash   Oxcarbazepine Other (See Comments)    unknown   Sulfa Antibiotics Other (See Comments)    Sharp pain in back of head   Tetracycline Itching    itching   Zetia [Ezetimibe] Other (See Comments)    possible cause of aches and abnormal dreams, stopped 05/2014    Past Medical History:  Diagnosis  Date   Arthritis    knee and hip OA- prev injection by Dr. Ethelene Hal   Blood transfusion without reported diagnosis    37 yrs ago -s/p back surgery   Chest pain    Coronary artery disease    50% mid LAD by cath January 2016   Depression 1993, 2000   Fibromyalgia    GERD (gastroesophageal reflux disease)    H/O hiatal hernia    Hyperlipidemia    Hypertension    OSA (obstructive sleep apnea)    off tx as of 2020, no sx as of 2020   Osteoporosis    Positive cardiac stress test    Stroke [facility])    Urinary incontinence    UTI (lower urinary tract infection)     Past Surgical History:  Procedure Laterality Date   ABDOMINAL HYSTERECTOMY  1976   APPENDECTOMY  1964   Back fusions  1977   missing vertebra   BACK SURGERY     fusions lumbar area   BREAST BIOPSY  1995   x 2   BREAST BIOPSY  07/10/2012   Procedure: BREAST BIOPSY WITH NEEDLE LOCALIZATION;  Surgeon:  Emelia Loron, MD;  Location: [facility];  Service: General;  Laterality: Right;   CARDIOVASCULAR STRESS TEST  2009   Normal, Dr. Mayford Knife   CATARACT EXTRACTION, BILATERAL Bilateral 2005   Childbirth  1954,1956,1957,1960   CYSTOSCOPY N/A 11/25/2012   Procedure: CYSTOSCOPY;  Surgeon: Martina Sinner, MD;  Location: [facility];  Service: Urology;  Laterality: N/A;   dilatation and currettage  1964   LEFT HEART CATH AND CORONARY ANGIOGRAPHY N/A 06/15/2017   Procedure: LEFT HEART CATH AND CORONARY ANGIOGRAPHY;  Surgeon: Lennette Bihari, MD;  Location: [facility];  Service: Cardiovascular;  Laterality: N/A;   LEFT HEART CATHETERIZATION WITH CORONARY ANGIOGRAM N/A 09/03/2014   Procedure: LEFT HEART CATHETERIZATION WITH CORONARY ANGIOGRAM;  Surgeon: Runell Gess, MD;  Location: [facility] CATH LAB;  Service: Cardiovascular;  Laterality: N/A;   LUMBAR LAMINECTOMY/DECOMPRESSION MICRODISCECTOMY N/A 05/01/2013   Procedure: COMPLETE DECOMPRESSIVE LUMBAR LAMINECTOMY L3-L4 MICRODISCECTOMY L3-L4 ON THE LEFT;  Surgeon: Jacki Cones, MD;  Location: [facility];  Service: Orthopedics;  Laterality: N/A;   OVARIAN CYST REMOVAL  1964   PUBOVAGINAL SLING N/A 11/25/2012   Procedure: Leonides Grills;  Surgeon: Martina Sinner, MD;  Location: [facility];  Service: Urology;  Laterality: N/A;    Family History  Problem Relation Age of Onset   Arthritis Mother    Stroke Father    Hypertension Father    Heart disease Brother        Died during CABG   Hypertension Sister    Dementia Sister    Aneurysm Sister        Brain   Parkinsonism Sister    Kidney disease Maternal Grandmother    Colon cancer Neg Hx    Breast cancer Neg Hx     Social History   Socioeconomic History   Marital status: Married    Spouse name: Not on file   Number of children: 4   Years of education: Not on file   Highest education level: Not on file  Occupational History    Occupation: RETIRED    Employer: RETIRED  Tobacco Use   Smoking status: Never   Smokeless tobacco: Never   Tobacco comments:    a small amount when 89 yo  Vaping Use   Vaping Use: Never used  Substance  and Sexual Activity   Alcohol use: No    Alcohol/week: 0.0 standard drinks of alcohol   Drug use: No   Sexual activity: Not Currently  Other Topics Concern   Not on file  Social History Narrative   Married 1953, husband is well.   5 pregnancies, 4 live births, all well (5th was unexpect pregnancy and patient needed to terminate due to concurrent medical problems at the time)   11 grandchildren.  9 great grandchildren   Regular exercise:  No   Social Determinants of Health   Financial Resource Strain: Low Risk  (12/28/2021)   Overall Financial Resource Strain (CARDIA)    Difficulty of Paying Living Expenses: Not hard at all  Food Insecurity: No Food Insecurity (12/28/2021)   Hunger Vital Sign    Worried About Running Out of Food in the Last Year: Never true    Ran Out of Food in the Last Year: Never true  Transportation Needs: No Transportation Needs (12/28/2021)   PRAPARE - Hydrologist (Medical): No    Lack of Transportation (Non-Medical): No  Physical Activity: Inactive (05/18/2020)   Exercise Vital Sign    Days of Exercise per Week: 0 days    Minutes of Exercise per Session: 0 min  Stress: No Stress Concern Present (12/28/2021)   [place]    Feeling of Stress : Not at all  Social Connections: Moderately Integrated (12/28/2021)   Social Connection and Isolation Panel [NHANES]    Frequency of Communication with Friends and Family: More than three times a week    Frequency of Social Gatherings with Friends and Family: Once a week    Attends Religious Services: More than 4 times per year    Active Member of [employer] or Organizations: No    Attends Archivist Meetings: Never     Marital Status: Married  Human resources officer Violence: Not At Risk (12/28/2021)   Humiliation, Afraid, Rape, and Kick questionnaire    Fear of Current or Ex-Partner: No    Emotionally Abused: No    Physically Abused: No    Sexually Abused: No   Review of Systems No N/V Eating okay    Objective:   Physical Exam Constitutional:      Appearance: Normal appearance.  Abdominal:     Palpations: Abdomen is soft.     Tenderness: There is no abdominal tenderness. There is no right CVA tenderness or left CVA tenderness.  Neurological:     Mental Status: She is alert.            Assessment & Plan:

## 2022-09-08 NOTE — Telephone Encounter (Signed)
Patient notified rx was changed  

## 2022-09-08 NOTE — Telephone Encounter (Signed)
Left a message advising pt that Dr Silvio Pate has left for the day. I will forward this to Dr Damita Dunnings as he is her PCP. Will see if he will make the change for her.

## 2022-09-08 NOTE — Telephone Encounter (Signed)
Pt called stating she saw letvak today, 1/19, for a uti & was prescribed for the meds, ciprofloxacin (CIPRO) 250 MG tablet. Pt states she's taken 1 pills but after doing research, she would rather take the meds, amoxicillin due to bad reviews on cipro. Call back # [phone]

## 2022-09-11 ENCOUNTER — Telehealth: Payer: Self-pay | Admitting: Family Medicine

## 2022-09-11 LAB — URINE CULTURE
MICRO NUMBER:: 14448270
SPECIMEN QUALITY:: ADEQUATE

## 2022-09-11 NOTE — Telephone Encounter (Signed)
Patient called stating that she has been having trouble sleeping since having the shoulder injections. She asked if this is a normal thing and also wanted to know what was in the injection?  Please advise.

## 2022-09-11 NOTE — Telephone Encounter (Signed)
I agree with Kayla.  I would expect perhaps a day or 2 of trouble sleeping after a cortisone injection but 11 days is not typical.  I would suggest if this is becoming a long-term problem you schedule an appointment with your primary care provider.

## 2022-09-11 NOTE — Telephone Encounter (Signed)
Spoke to pt and informed her. She verbalized understanding.

## 2022-09-11 NOTE — Progress Notes (Signed)
The sedimentation rate inflammation lab is normal.  This is great news.

## 2022-09-11 NOTE — Telephone Encounter (Signed)
Pt called to inquire about her sleeping issues and if it could be related to the steroid injections in her shoulder. Pt was told no, that the systemic effects of the steroid injections would have subsided by now. Discussed her Gabapentin dosage, which has been the same. Please advise.

## 2022-09-22 NOTE — Telephone Encounter (Signed)
Patient was seen 2 weeks ago by Dr. Gwenlyn Found. Dr. Gwenlyn Found, patient has low risk wrist surgery coming up, is she at acceptable risk to proceed from your perspective. A echocardiogram is currently scheduled for March, however does the patient need echo prior to final clearance? Her primary complaint during recent visit was palpitation since COVID 2 month ago.

## 2022-09-22 NOTE — Telephone Encounter (Signed)
   Patient Name: Robin Juarez  DOB: Sep 20, 1930 MRN: [id number]  Primary Cardiologist: Quay Burow, MD  Chart reviewed as part of pre-operative protocol coverage. Given past medical history and time since last visit, based on ACC/AHA guidelines, NEKIA MAXHAM is at acceptable risk for the planned procedure without further cardiovascular testing.   Per Dr. Gwenlyn Found "Pt is at low risk for carpel tunnel  release surgery "  The patient was advised that if she develops new symptoms prior to surgery to contact our office to arrange for a follow-up visit, and she verbalized understanding.  If needed, she may hold aspirin for 5 days prior to the procedure and restart as soon as possible afterward at the surgeon's discretion.  I will route this recommendation to the requesting party via Epic fax function and remove from pre-op pool.  Please call with questions.  [place], [place] 09/22/2022, 1:50 PM

## 2022-10-10 ENCOUNTER — Ambulatory Visit: Payer: Medicare Other | Admitting: Cardiovascular Disease

## 2022-10-10 ENCOUNTER — Ambulatory Visit: Payer: Medicare Other | Admitting: Family Medicine

## 2022-10-11 ENCOUNTER — Telehealth: Payer: Self-pay | Admitting: Family Medicine

## 2022-10-11 NOTE — Telephone Encounter (Signed)
Agree. Thanks

## 2022-10-11 NOTE — Telephone Encounter (Signed)
Please see what details you can get, in case we need to do anything else in addition to her magnesium level.  Please see about her symptoms.  Please let me know.  Thanks.

## 2022-10-11 NOTE — Telephone Encounter (Signed)
Pt called asking Robin Juarez if she can get her magnesium checked? Pt is requesting lab orders. Pt is also requesting cb with [facility]'s respone along with scheduling lab appt. Call back # [phone]

## 2022-10-11 NOTE — Telephone Encounter (Signed)
Spoke with patient and she is wanting lab done because she doesn't feel well, has sore and stiff legs, sore neck and shoulders and jaw pain when she tried to eat anything. Stated that her daughter wanted her to have this checked due to knowing someone had something like this with them and their magnesium was low. I advised patient that we should probably see her first due to everything she has going on and then do labs at her visit if needed. She made appt for 10/19/22 at 2:30 pm.

## 2022-10-19 ENCOUNTER — Ambulatory Visit: Payer: Medicare Other | Admitting: Family Medicine

## 2022-10-19 ENCOUNTER — Encounter: Payer: Self-pay | Admitting: Family Medicine

## 2022-10-19 VITALS — BP 122/62 | HR 75 | Temp 97.0°F | Ht 60.0 in | Wt 190.0 lb

## 2022-10-19 DIAGNOSIS — R609 Edema, unspecified: Secondary | ICD-10-CM

## 2022-10-19 DIAGNOSIS — R202 Paresthesia of skin: Secondary | ICD-10-CM | POA: Diagnosis not present

## 2022-10-19 DIAGNOSIS — R0602 Shortness of breath: Secondary | ICD-10-CM | POA: Diagnosis not present

## 2022-10-19 NOTE — Progress Notes (Signed)
Patient is been having some swelling in the ankles and legs.  Her joints feel sore, including some jaw pain with eating.  She has had some aches in her arms.  Gait has been limited by leg pain.  She is also had some hand numbness attributed to carpal tunnel syndrome.  She occasionally feels lightheaded and she has been fatigued.  She has some occasional stinging in the lower legs and she has had some shortness of breath with exertion, i.e. going up stairs.  She has an echo pending for 10/31/2022.  She is currently on a statin.  She has been taking amlodipine at baseline.  She is not short of breath supine.  Meds, vitals, and allergies reviewed.   ROS: Per HPI unless specifically indicated in ROS section   GEN: nad, alert and oriented HEENT: ncat NECK: supple w/o LA CV: rrr. PULM: ctab, no inc wob ABD: soft, +bs EXT: 1+ BLE edema SKIN: no acute rash but routine healing from carpal tunnel surgery noted on the bilateral wrist. She has right third and fourth finger paresthesia.  Her left hand is slightly puffy as expected with recent left-sided carpal tunnel surgery.  Skin is well-perfused.

## 2022-10-19 NOTE — Patient Instructions (Addendum)
Go to the lab on the way out.   If you have mychart we'll likely use that to update you.    Take care.  Glad to see you.

## 2022-10-20 HISTORY — PX: BILATERAL CARPAL TUNNEL RELEASE: SHX6508

## 2022-10-20 LAB — VITAMIN B12: Vitamin B-12: 1198 pg/mL — ABNORMAL HIGH (ref 211–911)

## 2022-10-20 LAB — TSH: TSH: 2.44 u[IU]/mL (ref 0.35–5.50)

## 2022-10-20 LAB — BRAIN NATRIURETIC PEPTIDE: Pro B Natriuretic peptide (BNP): 47 pg/mL (ref 0.0–100.0)

## 2022-10-20 LAB — MAGNESIUM: Magnesium: 1.9 mg/dL (ref 1.5–2.5)

## 2022-10-22 DIAGNOSIS — R609 Edema, unspecified: Secondary | ICD-10-CM | POA: Insufficient documentation

## 2022-10-22 NOTE — Progress Notes (Signed)
Office Visit Note  Patient: Robin Juarez             Date of Birth: 07-26-1931           MRN: [id number]             PCP: Tonia Ghent, MD Referring: Tonia Ghent, MD Visit Date: 10/23/2022   Subjective:  New Patient (Initial Visit) (Patient states she has pain and swelling in hands, arms, ankles, lower legs. Patient states she has back pain. Patient states her neck is stiff and sore. Patient states she has a hard time walking. )   History of Present Illness: Robin Juarez is a 89 y.o. female here for evaluation with chronic pain in bilateral hands and shoulders.  Symptoms have been chronic and ongoing for years but much more pronounced and limiting of activities in the past 1 year.  Previous shoulder pain had improved after steroid injection though she felt poorly for couple days after steroid dosing.  Back pain radiating to the hip and leg has been worse for months last year though she is also noticing some difficulty on both sides.  Very prolonged morning stiffness lasting hours gets partial relief later in the day.  She was prescribed for intermittent meloxicam use that was partially helpful but has been recommended against prolonged every day use due to side effect risk.  She feels like there is swelling occurring throughout all her extremities.  No associated rashes or skin discoloration.  X-rays of bilateral shoulders last year showing glenohumeral joint osteoarthritis on both sides otherwise pretty unremarkable.  Lumbar spine with severe multilevel degenerative changes.  Gets partial relief with intra-articular or systemic steroid treatment has been the most effective so far but without a long-lasting improvement.   Labs reviewed ANA 1:80 homogenous RF neg CCP neg ESR 23 CRP <1 CK 59   Activities of Daily Living:  Patient reports morning stiffness for 4 hours.   Patient Reports nocturnal pain.  Difficulty dressing/grooming: Reports Difficulty climbing  stairs: Reports Difficulty getting out of chair: Reports Difficulty using hands for taps, buttons, cutlery, and/or writing: Reports  Review of Systems  Constitutional:  Positive for fatigue.  HENT:  Positive for mouth dryness. Negative for mouth sores.   Eyes:  Negative for dryness.  Respiratory:  Positive for shortness of breath.   Cardiovascular:  Negative for chest pain and palpitations.  Gastrointestinal:  Negative for blood in stool, constipation and diarrhea.  Endocrine: Negative for increased urination.  Genitourinary:  Positive for involuntary urination.  Musculoskeletal:  Positive for joint pain, gait problem, joint pain, joint swelling, myalgias, muscle weakness, morning stiffness, muscle tenderness and myalgias.  Skin:  Negative for color change, rash, hair loss and sensitivity to sunlight.  Allergic/Immunologic: Negative for susceptible to infections.  Neurological:  Negative for dizziness and headaches.  Hematological:  Negative for swollen glands.  Psychiatric/Behavioral:  Positive for depressed mood and sleep disturbance. The patient is not nervous/anxious.     PMFS History:  Patient Active Problem List   Diagnosis Date Noted   Positive ANA (antinuclear antibody) 10/23/2022   Edema 10/22/2022   Acute cystitis without hematuria 09/08/2022   Palpitations 09/05/2022   Aortic stenosis, mild 09/05/2022   COVID 08/02/2022   Shoulder pain 04/05/2022   Venous stasis of both lower extremities 01/10/2022   Gait instability 01/10/2022   Chronic pain of both shoulders 12/13/2021   Bilateral shoulder region arthritis 11/15/2021   Cerebrovascular accident [facility]) 06/30/2021   Osteoarthritis  06/30/2021   Calculus of gallbladder without cholecystitis without obstruction 02/13/2021   Coronary artery disease 09/21/2020   Health care maintenance 05/22/2019   Vitamin D deficiency 05/22/2019   Cough 04/02/2019   Chest pain 06/14/2017   Left hip pain 12/20/2016   Skin change  03/03/2016   Leg length discrepancy 11/19/2014   Dysuria 10/25/2014   Positive cardiac stress test    Essential hypertension 08/19/2014   Atypical chest pain 08/18/2014   CVA (cerebral infarction) 08/18/2014   Balance problem 07/14/2014   Advance care planning 03/02/2014   Spinal stenosis, lumbar region, with neurogenic claudication 05/01/2013   Low back pain 04/17/2013   Paresthesia 08/21/2012   Tremor 08/21/2012   OSA (obstructive sleep apnea) 08/07/2012   Medicare annual wellness visit, subsequent 01/12/2012   Thumb pain 08/25/2011   Weakness 04/27/2011   HYPERGLYCEMIA, MILD 09/12/2010   DISORDER OF BONE AND CARTILAGE UNSPECIFIED 05/23/2010   Anxiety state 03/01/2010   ESOPHAGEAL SPASM 03/01/2010   HIATAL HERNIA WITH REFLUX 03/01/2010   GENERALIZED OSTEOARTHROSIS UNSPECIFIED SITE 03/01/2010   Rheumatism and fibrositis 03/01/2010   Myalgia 03/01/2010   OSTEOPOROSIS 03/01/2010   HLD (hyperlipidemia) 02/25/2010   MDD (major depressive disorder) 02/25/2010   GERD 02/25/2010   URINARY INCONTINENCE 02/25/2010   UTI'S, HX OF 02/25/2010    Past Medical History:  Diagnosis Date   Arthritis    knee and hip OA- prev injection by Dr. Nelva Bush   Blood transfusion without reported diagnosis    2 yrs ago -s/p back surgery   Chest pain    Coronary artery disease    50% mid LAD by cath January 2016   Depression 1993, 2000   Fibromyalgia    GERD (gastroesophageal reflux disease)    H/O hiatal hernia    Hyperlipidemia    Hypertension    OSA (obstructive sleep apnea)    off tx as of 2020, no sx as of 2020   Osteoporosis    Positive cardiac stress test    Stroke [facility])    Urinary incontinence    UTI (lower urinary tract infection)     Family History  Problem Relation Age of Onset   Arthritis Mother    Stroke Father    Hypertension Father    Hypertension Sister    Dementia Sister    Aneurysm Sister        Brain   Parkinsonism Sister    Heart disease Brother        Died  during CABG   Kidney disease Maternal Grandmother    Colon cancer Neg Hx    Breast cancer Neg Hx    Past Surgical History:  Procedure Laterality Date   ABDOMINAL HYSTERECTOMY  08/21/1974   APPENDECTOMY  08/21/1962   Back fusions  08/22/1975   missing vertebra   BACK SURGERY     fusions lumbar area   BREAST BIOPSY  08/21/1993   x 2   BREAST BIOPSY  07/10/2012   Procedure: BREAST BIOPSY WITH NEEDLE LOCALIZATION;  Surgeon: Rolm Bookbinder, MD;  Location: [place];  Service: General;  Laterality: Right;   CARDIOVASCULAR STRESS TEST  08/22/2007   Normal, Dr. Radford Pax   CARPAL TUNNEL RELEASE Bilateral    CATARACT EXTRACTION, BILATERAL Bilateral 08/22/2003   Childbirth  1954,1956,1957,1960   CYSTOSCOPY N/A 11/25/2012   Procedure: CYSTOSCOPY;  Surgeon: Reece Packer, MD;  Location: [place];  Service: Urology;  Laterality: N/A;   dilatation and currettage  08/21/1962   LEFT  HEART CATH AND CORONARY ANGIOGRAPHY N/A 06/15/2017   Procedure: LEFT HEART CATH AND CORONARY ANGIOGRAPHY;  Surgeon: Troy Sine, MD;  Location: [place] [facility];  Service: Cardiovascular;  Laterality: N/A;   LEFT HEART CATHETERIZATION WITH CORONARY ANGIOGRAM N/A 09/03/2014   Procedure: LEFT HEART CATHETERIZATION WITH CORONARY ANGIOGRAM;  Surgeon: Lorretta Harp, MD;  Location: [facility] CATH LAB;  Service: Cardiovascular;  Laterality: N/A;   LUMBAR LAMINECTOMY/DECOMPRESSION MICRODISCECTOMY N/A 05/01/2013   Procedure: COMPLETE DECOMPRESSIVE LUMBAR LAMINECTOMY L3-L4 MICRODISCECTOMY L3-L4 ON THE LEFT;  Surgeon: Tobi Bastos, MD;  Location: [facility];  Service: Orthopedics;  Laterality: N/A;   OVARIAN CYST REMOVAL  08/21/1962   PUBOVAGINAL SLING N/A 11/25/2012   Procedure: Gaynelle Arabian;  Surgeon: Reece Packer, MD;  Location: [facility];  Service: Urology;  Laterality: N/A;   Social History   Social History Narrative   Married 1953, husband is well.    5 pregnancies, 4 live births, all well (5th was unexpect pregnancy and patient needed to terminate due to concurrent medical problems at the time)   11 grandchildren.  9 great grandchildren   Regular exercise:  No   Immunization History  Administered Date(s) Administered   Fluad Quad(high Dose 65+) 05/22/2019, 06/02/2020, 06/07/2022   Influenza Split 05/27/2009, 06/01/2011, 05/03/2012   Influenza Whole 06/15/2010   Influenza, High Dose Seasonal PF 06/07/2021   Influenza,inj,Quad PF,6+ Mos 06/10/2013, 06/18/2014, 05/25/2015, 05/23/2016, 06/06/2017, 06/13/2018   Influenza,inj,quad, With Preservative 05/22/2019   PFIZER(Purple Top)SARS-COV-2 Vaccination 11/03/2019, 11/26/2019, 06/24/2020   Pfizer Covid-19 Vaccine Bivalent Booster 40yrs & up 05/02/2021   Pneumococcal Conjugate-13 11/17/2014   Pneumococcal Polysaccharide-23 08/21/1998, 05/02/1999   Td 08/22/2003, 07/29/2004   Tdap 04/21/2014   Zoster Recombinat (Shingrix) 05/01/2019, 07/29/2019   Zoster, Live 08/06/2006, 08/22/2007     Objective: Vital Signs: BP (!) 147/83 (BP Location: Right Arm, Patient Position: Sitting, Cuff Size: Normal)   Pulse 75   Resp 16   Ht 5' (1.524 m)   Wt 187 lb (84.8 kg)   BMI 36.52 kg/m    Physical Exam Eyes:     Conjunctiva/sclera: Conjunctivae normal.  Cardiovascular:     Rate and Rhythm: Normal rate and regular rhythm.  Pulmonary:     Effort: Pulmonary effort is normal.     Breath sounds: Normal breath sounds.  Lymphadenopathy:     Cervical: No cervical adenopathy.  Skin:    General: Skin is warm and dry.  Neurological:     Mental Status: She is alert.     Comments: Strength 5/5 proximal and distal  Psychiatric:        Mood and Affect: Mood normal.      Musculoskeletal Exam:  Bilateral shoulder pain with overhead abduction and external rotation, slightly limited passive ROM but better than active Elbows full ROM no tenderness or swelling Wrists full ROM no tenderness or  swelling Fingers mild 1st CMC joint squaring, full ROM no palpable synovitis No pain with hip internal and external rotation, no lateral hip tenderness Knees full ROM mild pain with full extension, no palpable effusion Ankles full ROM no tenderness or swelling   Investigation: No additional findings.  Imaging: No results found.  Recent Labs: Lab Results  Component Value Date   WBC 6.5 07/24/2022   HGB 12.9 07/24/2022   PLT 227 07/24/2022   NA 137 07/24/2022   K 3.9 07/24/2022   CL 101 07/24/2022   CO2 28 07/24/2022   GLUCOSE 124 (H) 07/24/2022   BUN 11  07/24/2022   CREATININE 0.74 07/24/2022   BILITOT 0.6 07/11/2022   ALKPHOS 75 07/11/2022   AST 16 07/11/2022   ALT 10 07/11/2022   PROT 7.2 07/11/2022   ALBUMIN 4.3 07/11/2022   CALCIUM 9.1 07/24/2022   GFRAA >60 01/02/2020    Speciality Comments: No specialty comments available.  Procedures:  No procedures performed Allergies: Digoxin, Nitrofurantoin, Alendronate sodium, Cephalosporins, Chlordiazepoxide hcl, Cyclobenzaprine, Lisinopril, Phenobarbital-belladonna alk, Praluent [alirocumab], Repatha [evolocumab], Ritalin [methylphenidate hcl], Ritalin [methylphenidate], Statins, Trimethoprim, Augmentin [amoxicillin-pot clavulanate], Cefaclor, Chlordiazepoxide, Clavulanic acid, Omeprazole, Oxcarbazepine, Sulfa antibiotics, Tetracycline, and Zetia [ezetimibe]   Assessment / Plan:     Visit Diagnoses: Positive ANA (antinuclear antibody) - Plan: RNP Antibody, Anti-Smith antibody, Sjogrens syndrome-A extractable nuclear antibody, Sjogrens syndrome-B extractable nuclear antibody, Anti-DNA antibody, double-stranded, C3 and C4, Sedimentation rate, C-reactive protein  Low positive ANA does not have specific clinical criteria outside of joint pain with reported swelling, no synovitis seen on exam today, and prolonged morning stiffness.  Will check specific antibody panels as detailed above also serum complement sedimentation rate and  CRP.  If there are elevated serum inflammatory markers would be more supportive of inflammatory arthritis but no definite clinical findings today.  Bilateral shoulder region arthritis Osteoarthritis, unspecified osteoarthritis type, unspecified site  Chronic bilateral shoulder pain but with significant underlying osteoarthritis there is no highly specific indication for polymyalgia rheumatica or autoimmune joint inflammation.  Low back and hip symptoms are asymmetrical.  Discussed possibility of retrying prednisone with more gradual taper if there is any evidence of active systemic inflammation on lab workup today.  Rheumatism and fibrositis   Orders: Orders Placed This Encounter  Procedures   RNP Antibody   Anti-Smith antibody   Sjogrens syndrome-A extractable nuclear antibody   Sjogrens syndrome-B extractable nuclear antibody   Anti-DNA antibody, double-stranded   C3 and C4   Sedimentation rate   C-reactive protein   No orders of the defined types were placed in this encounter.    Follow-Up Instructions: Return in about 3 weeks (around 11/13/2022) for New pt f/u 3wks.   Collier Salina, MD  Note - This record has been created using [employer].  Chart creation errors have been sought, but may not always  have been located. Such creation errors do not reflect on  the standard of medical care.

## 2022-10-22 NOTE — Assessment & Plan Note (Signed)
With some shortness of breath but not short of breath when supine.  On amlodipine.  Having diffuse aches, on statin.  Lower extremity edema noted, with echo pending.  She may need a trial off statin to see if that affects any of her pain.  I presume that her hand symptoms are related to carpal tunnel syndrome and hopefully that will improve as she continues healing.  We talked about checking routine labs today, given her fatigue and paresthesias.  See notes on orders.  I do not suspect amlodipine is causing her lower extremity edema though that would be possible.  Discussed.  Okay for outpatient follow-up.

## 2022-10-23 ENCOUNTER — Encounter: Payer: Self-pay | Admitting: Internal Medicine

## 2022-10-23 ENCOUNTER — Ambulatory Visit: Payer: Medicare Other | Attending: Internal Medicine | Admitting: Internal Medicine

## 2022-10-23 VITALS — BP 159/82 | HR 73 | Resp 16 | Ht 60.0 in | Wt 187.0 lb

## 2022-10-23 DIAGNOSIS — M25552 Pain in left hip: Secondary | ICD-10-CM

## 2022-10-23 DIAGNOSIS — M19012 Primary osteoarthritis, left shoulder: Secondary | ICD-10-CM

## 2022-10-23 DIAGNOSIS — M1990 Unspecified osteoarthritis, unspecified site: Secondary | ICD-10-CM | POA: Diagnosis not present

## 2022-10-23 DIAGNOSIS — M790 Rheumatism, unspecified: Secondary | ICD-10-CM

## 2022-10-23 DIAGNOSIS — R768 Other specified abnormal immunological findings in serum: Secondary | ICD-10-CM | POA: Diagnosis not present

## 2022-10-23 DIAGNOSIS — M19011 Primary osteoarthritis, right shoulder: Secondary | ICD-10-CM

## 2022-10-24 ENCOUNTER — Other Ambulatory Visit (HOSPITAL_COMMUNITY): Payer: Medicare Other

## 2022-10-24 LAB — ANTI-SMITH ANTIBODY: ENA SM Ab Ser-aCnc: <1 AI

## 2022-10-24 LAB — SJOGRENS SYNDROME-B EXTRACTABLE NUCLEAR ANTIBODY: SSB (La) (ENA) Antibody, IgG: <1 AI

## 2022-10-24 LAB — SJOGRENS SYNDROME-A EXTRACTABLE NUCLEAR ANTIBODY: SSA (Ro) (ENA) Antibody, IgG: <1 AI

## 2022-10-24 LAB — ANTI-DNA ANTIBODY, DOUBLE-STRANDED: ds DNA Ab: 12 IU/mL — ABNORMAL HIGH

## 2022-10-24 LAB — C3 AND C4
C3 Complement: 150 mg/dL
C4 Complement: 31 mg/dL

## 2022-10-24 LAB — RNP ANTIBODY: Ribonucleic Protein(ENA) Antibody, IgG: <1 AI

## 2022-10-24 LAB — C-REACTIVE PROTEIN: CRP: 3.1 mg/L (ref <8.0)

## 2022-10-24 LAB — SEDIMENTATION RATE: Sed Rate: 25 mm/h (ref 0–30)

## 2022-10-27 ENCOUNTER — Telehealth: Payer: Self-pay | Admitting: Family Medicine

## 2022-10-27 MED ORDER — FUROSEMIDE 20 MG PO TABS: 20.0000 mg | ORAL_TABLET | Freq: Every day | ORAL | 0 refills | Status: DC | PRN

## 2022-10-27 NOTE — Telephone Encounter (Signed)
Pt called to let Damita Dunnings know her feet & legs are swollen. Pt wanted to ask Damita Dunnings what can be done about the swelling? Call back # [phone]

## 2022-10-27 NOTE — Addendum Note (Signed)
Addended by: Tonia Ghent on: 10/27/2022 05:05 PM   Modules accepted: Orders

## 2022-10-27 NOTE — Telephone Encounter (Signed)
Called pt, would try lasix prn with routine cautions d/w pt.  She isn't SOB.  Would only use prn.  With echo pending, will await that result.  She agrees with plan.

## 2022-10-27 NOTE — Telephone Encounter (Signed)
Patient states that swelling has increased starting about 1 week ago. At times it can be warm to touch in legs. Denies any drainage but legs do turn red at times. Has not tried anything overt than compression stockings that will help some but does not wear often. She has been working on elevating but when she elevates to much has a lot of pain in top of legs in shin area. Patient denies any medication changes. Or changes in diet. She wanted to know if there was anything she can try to get some help with swelling. Patient informed that she may not get answer today and may be Monday. I have advised all ED/urgent precautions for weekend.

## 2022-10-31 ENCOUNTER — Ambulatory Visit (HOSPITAL_COMMUNITY): Payer: Medicare Other | Attending: Cardiovascular Disease

## 2022-10-31 DIAGNOSIS — I10 Essential (primary) hypertension: Secondary | ICD-10-CM | POA: Diagnosis present

## 2022-10-31 DIAGNOSIS — E782 Mixed hyperlipidemia: Secondary | ICD-10-CM | POA: Diagnosis not present

## 2022-10-31 DIAGNOSIS — I2510 Atherosclerotic heart disease of native coronary artery without angina pectoris: Secondary | ICD-10-CM | POA: Diagnosis present

## 2022-10-31 DIAGNOSIS — R002 Palpitations: Secondary | ICD-10-CM | POA: Insufficient documentation

## 2022-10-31 DIAGNOSIS — I350 Nonrheumatic aortic (valve) stenosis: Secondary | ICD-10-CM | POA: Diagnosis not present

## 2022-11-01 LAB — ECHOCARDIOGRAM COMPLETE
AR max vel: 1.36 cm2
AV Area VTI: 1.5 cm2
AV Area mean vel: 1.59 cm2
AV Mean grad: 12 mmHg
AV Peak grad: 23.6 mmHg
Ao pk vel: 2.43 m/s
Area-P 1/2: 2.92 cm2
S' Lateral: 2.6 cm

## 2022-11-08 ENCOUNTER — Encounter: Payer: Medicare Other | Admitting: Internal Medicine

## 2022-11-10 ENCOUNTER — Telehealth: Payer: Self-pay | Admitting: Family Medicine

## 2022-11-10 NOTE — Telephone Encounter (Signed)
Please get update on patient.  Did she notice any change with stopping Crestor?  Less aches?  Please let me know.  I saw her echo report, which was reassuring.  How is her breathing, is she having swelling, and how often does she have to take Lasix?  Has Lasix been helpful?  Please let me know.  Thanks.

## 2022-11-10 NOTE — Telephone Encounter (Signed)
LMTCB

## 2022-11-13 NOTE — Telephone Encounter (Signed)
Patient returned call . Would like a call back 

## 2022-11-13 NOTE — Telephone Encounter (Signed)
LMTCB

## 2022-11-13 NOTE — Telephone Encounter (Signed)
I need clarification- does she have less aches since she has stopped crestor?  Please let me know.    If the lasix helped some, then I would continue lasix as needed- she may not need it daily.  Thanks.

## 2022-11-13 NOTE — Telephone Encounter (Signed)
Patient returned called again.I went ahead and relayed message to her. She stated that the Crestor is helping her,she is having less aches. Sje said that the fluid pills are helping a little ,she has a little swelling still in her legs and feet. She stopped taking them 2 days ago due to excessive urination. She is taking the lasix once in the morning,and they are helping her a little.

## 2022-11-14 ENCOUNTER — Ambulatory Visit: Payer: Medicare Other | Attending: Internal Medicine | Admitting: Internal Medicine

## 2022-11-14 ENCOUNTER — Encounter: Payer: Self-pay | Admitting: Internal Medicine

## 2022-11-14 VITALS — BP 160/83 | HR 76 | Resp 16 | Ht 60.0 in | Wt 186.0 lb

## 2022-11-14 DIAGNOSIS — M790 Rheumatism, unspecified: Secondary | ICD-10-CM | POA: Diagnosis not present

## 2022-11-14 DIAGNOSIS — R768 Other specified abnormal immunological findings in serum: Secondary | ICD-10-CM | POA: Diagnosis not present

## 2022-11-14 DIAGNOSIS — I878 Other specified disorders of veins: Secondary | ICD-10-CM

## 2022-11-14 DIAGNOSIS — E782 Mixed hyperlipidemia: Secondary | ICD-10-CM

## 2022-11-14 NOTE — Telephone Encounter (Signed)
I would stay off crestor for now since she feels better off the med.  I would continue with lasix prn/as needed.  I would try to elevate her legs/feet when possible.    I routed this to Dr. Gwenlyn Found to see about substitutes for crestor.    I thank all involved.

## 2022-11-14 NOTE — Telephone Encounter (Signed)
Patient returned call,she said that she has an appointment in [place] today and that she may not be home when you try to call her again.

## 2022-11-14 NOTE — Telephone Encounter (Signed)
Patient states that since being off the crestor is has helped with the aches. Lasix only helps some; states she still has swelling in legs and feet. She can not take it when she has to go somewhere so she has not taken it today due to having a doctors appt in [place].

## 2022-11-14 NOTE — Patient Instructions (Signed)
For osteoarthritis several treatments may be beneficial:  - Topical antiinflammatory medicine such as diclofenac or Voltaren can be applied to  affected area as needed. Topical analgesics containing CBD, menthol, or lidocaine can be tried.  - Turmeric has some antiinflammatory effect if taken as a supplement should not be taken above recommended doses 400-500mg . Can also try supplements such as glucosamine or fish and krill oil   - Compressive gloves or sleeve can be helpful to support the joint especially if hurting or swelling with certain activities.  - Physical therapy referral can discuss exercises or activity modification to improve symptoms or strength if needed.  - Local steroid injection is an option if symptoms become worse and not controlled by the above options.

## 2022-11-14 NOTE — Progress Notes (Signed)
Office Visit Note  Patient: Robin Juarez             Date of Birth: 14-Jun-1931           MRN: [id number]             PCP: Tonia Ghent, MD Referring: Tonia Ghent, MD Visit Date: 11/14/2022   Subjective:  Follow-up   History of Present Illness: Robin Juarez is a 89 y.o. female here for follow up for persistent joint and muscle pains associated with positive ANA.  Workup at initial visit demonstrated a very low positive double-stranded DNA antibody otherwise negative results.  She discontinued her Crestor medication and after a few days later noticed a significant improvement in her body aches and mobility.  She still has joint pain and stiffness involving the hips and shoulders but she feels like this is significantly decreased and does not think she would need a repeat of oral or injectable steroid course at this time.  She still having issue with swelling in bilateral feet and ankles.  He was prescribed Lasix from her PCP Dr. Damita Dunnings she noticed some decrease in swelling when taking the medication but then stopped taking it as directed for as needed use and noticed some return of lower extremity swelling.  She gets some stinging or tingling discomfort in the affected areas and occasionally turned red and feel very hot to the touch.   Previous HPI 10/23/22 [name] is a 89 y.o. female here for evaluation with chronic pain in bilateral hands and shoulders.  Symptoms have been chronic and ongoing for years but much more pronounced and limiting of activities in the past 1 year.  Previous shoulder pain had improved after steroid injection though she felt poorly for couple days after steroid dosing.  Back pain radiating to the hip and leg has been worse for months last year though she is also noticing some difficulty on both sides.  Very prolonged morning stiffness lasting hours gets partial relief later in the day.  She was prescribed for intermittent meloxicam use that  was partially helpful but has been recommended against prolonged every day use due to side effect risk.  She feels like there is swelling occurring throughout all her extremities.  No associated rashes or skin discoloration.  X-rays of bilateral shoulders last year showing glenohumeral joint osteoarthritis on both sides otherwise pretty unremarkable.  Lumbar spine with severe multilevel degenerative changes.  Gets partial relief with intra-articular or systemic steroid treatment has been the most effective so far but without a long-lasting improvement.     Labs reviewed ANA 1:80 homogenous RF neg CCP neg ESR 23 CRP <1 CK 59   Review of Systems  Constitutional:  Positive for fatigue.  HENT:  Positive for mouth dryness. Negative for mouth sores.   Eyes:  Positive for dryness.  Respiratory:  Negative for shortness of breath.   Cardiovascular:  Negative for chest pain and palpitations.  Gastrointestinal:  Negative for blood in stool, constipation and diarrhea.  Endocrine: Negative for increased urination.  Genitourinary:  Positive for involuntary urination.  Musculoskeletal:  Positive for joint pain, gait problem, joint pain, joint swelling, myalgias, muscle weakness, morning stiffness, muscle tenderness and myalgias.  Skin:  Negative for color change, rash, hair loss and sensitivity to sunlight.  Allergic/Immunologic: Negative for susceptible to infections.  Neurological:  Negative for dizziness and headaches.  Hematological:  Negative for swollen glands.  Psychiatric/Behavioral:  Positive for depressed mood and  sleep disturbance. The patient is not nervous/anxious.     PMFS History:  Patient Active Problem List   Diagnosis Date Noted   Positive ANA (antinuclear antibody) 10/23/2022   Edema 10/22/2022   Acute cystitis without hematuria 09/08/2022   Palpitations 09/05/2022   Aortic stenosis, mild 09/05/2022   COVID 08/02/2022   Shoulder pain 04/05/2022   Venous stasis of both lower  extremities 01/10/2022   Gait instability 01/10/2022   Chronic pain of both shoulders 12/13/2021   Bilateral shoulder region arthritis 11/15/2021   Cerebrovascular accident ([place]) 06/30/2021   Osteoarthritis 06/30/2021   Calculus of gallbladder without cholecystitis without obstruction 02/13/2021   Coronary artery disease 09/21/2020   Health care maintenance 05/22/2019   Vitamin D deficiency 05/22/2019   Cough 04/02/2019   Chest pain 06/14/2017   Left hip pain 12/20/2016   Skin change 03/03/2016   Leg length discrepancy 11/19/2014   Dysuria 10/25/2014   Positive cardiac stress test    Essential hypertension 08/19/2014   Atypical chest pain 08/18/2014   CVA (cerebral infarction) 08/18/2014   Balance problem 07/14/2014   Advance care planning 03/02/2014   Spinal stenosis, lumbar region, with neurogenic claudication 05/01/2013   Low back pain 04/17/2013   Paresthesia 08/21/2012   Tremor 08/21/2012   OSA (obstructive sleep apnea) 08/07/2012   Medicare annual wellness visit, subsequent 01/12/2012   Thumb pain 08/25/2011   Weakness 04/27/2011   HYPERGLYCEMIA, MILD 09/12/2010   DISORDER OF BONE AND CARTILAGE UNSPECIFIED 05/23/2010   Anxiety state 03/01/2010   ESOPHAGEAL SPASM 03/01/2010   HIATAL HERNIA WITH REFLUX 03/01/2010   GENERALIZED OSTEOARTHROSIS UNSPECIFIED SITE 03/01/2010   Rheumatism and fibrositis 03/01/2010   Myalgia 03/01/2010   OSTEOPOROSIS 03/01/2010   HLD (hyperlipidemia) 02/25/2010   MDD (major depressive disorder) 02/25/2010   GERD 02/25/2010   URINARY INCONTINENCE 02/25/2010   UTI'S, HX OF 02/25/2010    Past Medical History:  Diagnosis Date   Arthritis    knee and hip OA- prev injection by Dr. Nelva Bush   Blood transfusion without reported diagnosis    43 yrs ago -s/p back surgery   Chest pain    Coronary artery disease    50% mid LAD by cath January 2016   Depression 1993, 2000   Fibromyalgia    GERD (gastroesophageal reflux disease)    H/O hiatal  hernia    Hyperlipidemia    Hypertension    OSA (obstructive sleep apnea)    off tx as of 2020, no sx as of 2020   Osteoporosis    Positive cardiac stress test    Stroke [facility])    Urinary incontinence    UTI (lower urinary tract infection)     Family History  Problem Relation Age of Onset   Arthritis Mother    Stroke Father    Hypertension Father    Hypertension Sister    Dementia Sister    Aneurysm Sister        Brain   Parkinsonism Sister    Heart disease Brother        Died during CABG   Kidney disease Maternal Grandmother    Colon cancer Neg Hx    Breast cancer Neg Hx    Past Surgical History:  Procedure Laterality Date   ABDOMINAL HYSTERECTOMY  08/21/1974   APPENDECTOMY  08/21/1962   Back fusions  08/22/1975   missing vertebra   BACK SURGERY     fusions lumbar area   BILATERAL CARPAL TUNNEL RELEASE Bilateral 10/2022   BREAST  BIOPSY  08/21/1993   x 2   BREAST BIOPSY  07/10/2012   Procedure: BREAST BIOPSY WITH NEEDLE LOCALIZATION;  Surgeon: Rolm Bookbinder, MD;  Location: [place];  Service: General;  Laterality: Right;   CARDIOVASCULAR STRESS TEST  08/22/2007   Normal, Dr. Radford Pax   CARPAL TUNNEL RELEASE Bilateral 08/2022   CATARACT EXTRACTION, BILATERAL Bilateral 08/22/2003   Childbirth  1954,1956,1957,1960   CYSTOSCOPY N/A 11/25/2012   Procedure: CYSTOSCOPY;  Surgeon: Reece Packer, MD;  Location: [facility];  Service: Urology;  Laterality: N/A;   dilatation and currettage  08/21/1962   LEFT HEART CATH AND CORONARY ANGIOGRAPHY N/A 06/15/2017   Procedure: LEFT HEART CATH AND CORONARY ANGIOGRAPHY;  Surgeon: Troy Sine, MD;  Location: [place] [facility];  Service: Cardiovascular;  Laterality: N/A;   LEFT HEART CATHETERIZATION WITH CORONARY ANGIOGRAM N/A 09/03/2014   Procedure: LEFT HEART CATHETERIZATION WITH CORONARY ANGIOGRAM;  Surgeon: Lorretta Harp, MD;  Location[facility] CATH LAB;  Service: Cardiovascular;  Laterality:  N/A;   LUMBAR LAMINECTOMY/DECOMPRESSION MICRODISCECTOMY N/A 05/01/2013   Procedure: COMPLETE DECOMPRESSIVE LUMBAR LAMINECTOMY L3-L4 MICRODISCECTOMY L3-L4 ON THE LEFT;  Surgeon: Tobi Bastos, MD;  Location: [facility];  Service: Orthopedics;  Laterality: N/A;   OVARIAN CYST REMOVAL  08/21/1962   PUBOVAGINAL SLING N/A 11/25/2012   Procedure: Gaynelle Arabian;  Surgeon: Reece Packer, MD;  Location: [facility];  Service: Urology;  Laterality: N/A;   Social History   Social History Narrative   Married 1953, husband is well.   5 pregnancies, 4 live births, all well (5th was unexpect pregnancy and patient needed to terminate due to concurrent medical problems at the time)   11 grandchildren.  9 great grandchildren   Regular exercise:  No   Immunization History  Administered Date(s) Administered   Fluad Quad(high Dose 65+) 05/22/2019, 06/02/2020, 06/07/2022   Influenza Split 05/27/2009, 06/01/2011, 05/03/2012   Influenza Whole 06/15/2010   Influenza, High Dose Seasonal PF 06/07/2021   Influenza,inj,Quad PF,6+ Mos 06/10/2013, 06/18/2014, 05/25/2015, 05/23/2016, 06/06/2017, 06/13/2018   Influenza,inj,quad, With Preservative 05/22/2019   PFIZER(Purple Top)SARS-COV-2 Vaccination 11/03/2019, 11/26/2019, 06/24/2020   Pfizer Covid-19 Vaccine Bivalent Booster 49yrs & up 05/02/2021   Pneumococcal Conjugate-13 11/17/2014   Pneumococcal Polysaccharide-23 08/21/1998, 05/02/1999   Td 08/22/2003, 07/29/2004   Tdap 04/21/2014   Zoster Recombinat (Shingrix) 05/01/2019, 07/29/2019   Zoster, Live 08/06/2006, 08/22/2007     Objective: Vital Signs: BP (!) 160/83 (BP Location: Left Arm, Patient Position: Sitting, Cuff Size: Normal)   Pulse 76   Resp 16   Ht 5' (1.524 m)   Wt 186 lb (84.4 kg)   BMI 36.33 kg/m    Physical Exam Eyes:     Conjunctiva/sclera: Conjunctivae normal.  Cardiovascular:     Rate and Rhythm: Normal rate and regular rhythm.  Pulmonary:     Effort:  Pulmonary effort is normal.     Breath sounds: Normal breath sounds.  Musculoskeletal:     Right lower leg: Edema present.     Left lower leg: Edema present.  Lymphadenopathy:     Cervical: No cervical adenopathy.  Skin:    General: Skin is warm and dry.     Comments: 1+ edema in bilateral feet and ankles, chronic skin thickening with mild warmth to touch, no pupura  Neurological:     Mental Status: She is alert.  Psychiatric:        Mood and Affect: Mood normal.      Musculoskeletal Exam:  Bilateral shoulder pain with overhead abduction and external rotation both active and passive limited, no palpable swelling Elbows full ROM no tenderness or swelling Wrists full ROM no tenderness or swelling Fingers full ROM 1st CMC joint squaring, no synovitis Knees full ROM no tenderness or swelling Ankles full ROM no tenderness or swelling   Investigation: No additional findings.  Imaging: ECHOCARDIOGRAM COMPLETE  Result Date: 11/01/2022    ECHOCARDIOGRAM REPORT   Patient Name:   SHERADYN KWIATEK [facility] Date of Exam: 10/31/2022 Medical Rec #:  [id number]          Height:       60.0 in Accession #:    [id number]         Weight:       187.0 lb Date of Birth:  10/06/1930         BSA:          1.814 m Patient Age:    4 years           BP:           159/82 mmHg Patient Gender: F                  HR:           72 bpm. Exam Location:  [address] Procedure: 2D Echo, Cardiac Doppler and Color Doppler Indications:    I35.0 AS  History:        Patient has prior history of Echocardiogram examinations, most                 recent 11/17/2021. CAD, Obesity, AS; Risk Factors:Palpitations,                 Hypertension and Dyslipidemia.  Sonographer:    Coralyn Helling RDCS Referring Phys: [place]  1. Left ventricular ejection fraction, by estimation, is 60 to 65%. The left ventricle has normal function. The left ventricle has no regional wall motion abnormalities. There is moderate  asymmetric left ventricular hypertrophy of the basal-septal segment. Left ventricular diastolic parameters are consistent with Grade I diastolic dysfunction (impaired relaxation). Elevated left ventricular end-diastolic pressure.  2. Right ventricular systolic function is normal. The right ventricular size is normal. There is normal pulmonary artery systolic pressure.  3. Left atrial size was mildly dilated.  4. The mitral valve is normal in structure. Trivial mitral valve regurgitation. No evidence of mitral stenosis.  5. The aortic valve is tricuspid. There is mild calcification of the aortic valve. There is mild thickening of the aortic valve. Aortic valve regurgitation is trivial. Mild aortic valve stenosis. Aortic valve area, by VTI measures 1.50 cm. Aortic valve  mean gradient measures 12.0 mmHg. Aortic valve Vmax measures 2.43 m/s.  6. The inferior vena cava is normal in size with greater than 50% respiratory variability, suggesting right atrial pressure of 3 mmHg. FINDINGS  Left Ventricle: Left ventricular ejection fraction, by estimation, is 60 to 65%. The left ventricle has normal function. The left ventricle has no regional wall motion abnormalities. The left ventricular internal cavity size was normal in size. There is  moderate asymmetric left ventricular hypertrophy of the basal-septal segment. Left ventricular diastolic parameters are consistent with Grade I diastolic dysfunction (impaired relaxation). Elevated left ventricular end-diastolic pressure. Right Ventricle: The right ventricular size is normal. No increase in right ventricular wall thickness. Right ventricular systolic function is normal. There is normal pulmonary artery systolic pressure. The tricuspid regurgitant velocity is 2.48 m/s, and  with an assumed right atrial pressure of 3 mmHg, the estimated right ventricular systolic pressure is 0000000 mmHg. Left Atrium: Left atrial size was mildly dilated. Right Atrium: Right atrial size was  normal in size. Pericardium: There is no evidence of pericardial effusion. Mitral Valve: The mitral valve is normal in structure. Mild mitral annular calcification. Trivial mitral valve regurgitation. No evidence of mitral valve stenosis. Tricuspid Valve: The tricuspid valve is normal in structure. Tricuspid valve regurgitation is mild . No evidence of tricuspid stenosis. Aortic Valve: The aortic valve is tricuspid. There is mild calcification of the aortic valve. There is mild thickening of the aortic valve. Aortic valve regurgitation is trivial. Mild aortic stenosis is present. Aortic valve mean gradient measures 12.0 mmHg. Aortic valve peak gradient measures 23.6 mmHg. Aortic valve area, by VTI measures 1.50 cm. Pulmonic Valve: The pulmonic valve was normal in structure. Pulmonic valve regurgitation is not visualized. No evidence of pulmonic stenosis. Aorta: The aortic root is normal in size and structure. Venous: The inferior vena cava is normal in size with greater than 50% respiratory variability, suggesting right atrial pressure of 3 mmHg. IAS/Shunts: No atrial level shunt detected by color flow Doppler.  LEFT VENTRICLE PLAX 2D LVIDd:         4.10 cm   Diastology LVIDs:         2.60 cm   LV e' medial:    5.98 cm/s LV PW:         1.00 cm   LV E/e' medial:  16.7 LV IVS:        1.60 cm   LV e' lateral:   8.49 cm/s LVOT diam:     2.00 cm   LV E/e' lateral: 11.8 LV SV:         83 LV SV Index:   46 LVOT Area:     3.14 cm  RIGHT VENTRICLE             IVC RV S prime:     13.20 cm/s  IVC diam: 1.40 cm TAPSE (M-mode): 1.9 cm RVSP:           27.6 mmHg LEFT ATRIUM             Index        RIGHT ATRIUM           Index LA diam:        3.50 cm 1.93 cm/m   RA Pressure: 3.00 mmHg LA Vol (A2C):   60.8 ml 33.52 ml/m  RA Area:     10.90 cm LA Vol (A4C):   37.6 ml 20.73 ml/m  RA Volume:   23.80 ml  13.12 ml/m LA Biplane Vol: 49.9 ml 27.51 ml/m  AORTIC VALVE AV Area (Vmax):    1.36 cm AV Area (Vmean):   1.59 cm AV Area  (VTI):     1.50 cm AV Vmax:           243.00 cm/s AV Vmean:          158.000 cm/s AV VTI:            0.556 m AV Peak Grad:      23.6 mmHg AV Mean Grad:      12.0 mmHg LVOT Vmax:         105.00 cm/s LVOT Vmean:        79.900 cm/s LVOT VTI:          0.265 m LVOT/AV VTI ratio: 0.48  AORTA Ao  Root diam: 3.30 cm Ao Asc diam:  2.90 cm MITRAL VALVE                TRICUSPID VALVE MV Area (PHT): 2.92 cm     TR Peak grad:   24.6 mmHg MV Decel Time: 260 msec     TR Vmax:        248.00 cm/s MV E velocity: 100.00 cm/s  Estimated RAP:  3.00 mmHg MV A velocity: 122.00 cm/s  RVSP:           27.6 mmHg MV E/A ratio:  0.82                             SHUNTS                             Systemic VTI:  0.26 m                             Systemic Diam: 2.00 cm Skeet Latch MD Electronically signed by Skeet Latch MD Signature Date/Time: 11/01/2022/9:33:16 AM    Final     Recent Labs: Lab Results  Component Value Date   WBC 6.5 07/24/2022   HGB 12.9 07/24/2022   PLT 227 07/24/2022   NA 137 07/24/2022   K 3.9 07/24/2022   CL 101 07/24/2022   CO2 28 07/24/2022   GLUCOSE 124 (H) 07/24/2022   BUN 11 07/24/2022   CREATININE 0.74 07/24/2022   BILITOT 0.6 07/11/2022   ALKPHOS 75 07/11/2022   AST 16 07/11/2022   ALT 10 07/11/2022   PROT 7.2 07/11/2022   ALBUMIN 4.3 07/11/2022   CALCIUM 9.1 07/24/2022   GFRAA >60 01/02/2020    Speciality Comments: No specialty comments available.  Procedures:  No procedures performed Allergies: Digoxin, Nitrofurantoin, Alendronate sodium, Cephalosporins, Chlordiazepoxide hcl, Cyclobenzaprine, Lisinopril, Phenobarbital-belladonna alk, Praluent [alirocumab], Repatha [evolocumab], Ritalin [methylphenidate hcl], Ritalin [methylphenidate], Statins, Trimethoprim, Augmentin [amoxicillin-pot clavulanate], Cefaclor, Chlordiazepoxide, Clavulanic acid, Omeprazole, Oxcarbazepine, Sulfa antibiotics, Tetracycline, and Zetia [ezetimibe]   Assessment / Plan:     Visit Diagnoses: Positive  ANA (antinuclear antibody)  Results did show a low positive double-stranded DNA of 14 but clinically does not have criteria for systemic lupus.  Discussed low positive may be a false result though double-stranded DNA can correlate with disease activity and would be higher during more active inflammation.  I do not recommend starting any specific DMARD treatment and no indication for resuming corticosteroids at this time.  I think we can just follow-up as needed if starts to develop new or more extensive symptom involvement to retest.  Rheumatism and fibrositis  Continued joint pain stiffness and decreased range of motion looks most consistent with generalized osteoarthritis.  No joint synovitis no proximal or distal muscle weakness suggesting inflammatory process.  Some recommendations on local therapies or supplements for osteoarthritis symptom management.  Venous stasis of both lower extremities  Findings are consistent with some chronic venous stasis mild inflammatory changes probably some early lipodermatosclerosis with the skin hardening.  No appreciable petechiae or purpura suggestive for major associated cutaneous vasculitis.  Discussed conservative options including use of compressive socks and leg elevation.  She is following up with her PCP office about the as needed Lasix and has plans for echocardiogram to rule out central causes.  Mixed hyperlipidemia  History is very suspicious for statin drug intolerance  and she had previous intolerance to injectable agents for high cholesterol.  She will need to follow-up with PCP office for management. Discussed some possibilities today such as supplemental treatment with omega fatty acids, niacin need to try and maintain some exercise, may have to consider gemfibrozil or other less optimal treatment options.   Orders: No orders of the defined types were placed in this encounter.  No orders of the defined types were placed in this  encounter.    Follow-Up Instructions: Return if symptoms worsen or fail to improve.   Collier Salina, MD  Note - This record has been created using [employer].  Chart creation errors have been sought, but may not always  have been located. Such creation errors do not reflect on  the standard of medical care.

## 2022-11-15 NOTE — Telephone Encounter (Signed)
Robin Harp, MD  Lenell Antu hours ago (5:20 PM)    Given her age and her intolerance to statins I think I would keep her off these for now.

## 2022-11-15 NOTE — Telephone Encounter (Signed)
Called and advised patient of the message below. She verbalized understanding. Patient did not have any further questions.  She will call back with any further questions or if she needs assistance with anything.

## 2022-11-15 NOTE — Telephone Encounter (Signed)
Noted. Thanks.

## 2022-11-16 ENCOUNTER — Telehealth: Payer: Self-pay

## 2022-11-16 NOTE — Telephone Encounter (Signed)
Spoke to pt. She said she has swelling almost daily. So, she will try the 1/2 tab of furosemide daily and will keep [place] updated.

## 2022-11-16 NOTE — Telephone Encounter (Signed)
Pt called and pt stopped furosemide 20 mg on 11/11/22. Pt said yesterday she had swelling in lower legs,ankles and feet. Pt took Furosemide 20 mg yesterday. Pt said that she went to sleep last night at 8 PM and slept until this morning at 8 AM. Pt spoke with pharmacist this morning and pt was advised by pharmacist that the furosemide could cause her to sleep too much and could interact with other medication. Pt was advised by pharmacist to stop furosemide and call PCP immediately. Pt said she is not having any swelling in mouth, tongue or throat and no difficulty breathing. No blueness around lips and no difficulty breathing. Pt said she usually has swelling in both hands and that is no worse than usual and swelling in lower legs, ankles and feet appear better this morning. When I asked pt how she was feeling she said she feels over medicated. I asked pt what that meant and she said she feels sluggish and bogged down.pt BP now is 131/81 P 86. Dr Damita Dunnings said pt can hold Furosemide 1 - 2 days and if BP is OK to send note to DR Damita Dunnings. Pt said she had been drinking more water and is feeling a little better now. UC & ED precautions given and pt voiced understanding and will wait for cb after Dr Damita Dunnings has reviewed note. Sending note to Dr Damita Dunnings and Damita Dunnings pool. Will teams [facility] CMA.

## 2022-11-16 NOTE — Telephone Encounter (Signed)
I wouldn't expect furosemide to interact with her other meds but if she takes it and it lowers her BP that could cause fatigue.  I would hold it for now and only used if needed for swelling.  The other option would be to take 1/2 tab a day if needed, instead of a whole tab.  Please update [place] as needed.  Thanks.

## 2022-11-30 ENCOUNTER — Ambulatory Visit: Payer: Medicare Other | Admitting: Family Medicine

## 2022-12-12 ENCOUNTER — Other Ambulatory Visit: Payer: Self-pay | Admitting: Family Medicine

## 2022-12-12 ENCOUNTER — Other Ambulatory Visit: Payer: Self-pay | Admitting: Cardiovascular Disease

## 2022-12-18 ENCOUNTER — Other Ambulatory Visit: Payer: Self-pay | Admitting: Family Medicine

## 2022-12-26 ENCOUNTER — Ambulatory Visit: Payer: Medicare Other | Admitting: Family Medicine

## 2022-12-26 ENCOUNTER — Ambulatory Visit (INDEPENDENT_AMBULATORY_CARE_PROVIDER_SITE_OTHER): Payer: Medicare Other

## 2022-12-26 VITALS — BP 130/84 | HR 94 | Ht 60.0 in | Wt 183.0 lb

## 2022-12-26 DIAGNOSIS — M545 Low back pain, unspecified: Secondary | ICD-10-CM

## 2022-12-26 DIAGNOSIS — M25551 Pain in right hip: Secondary | ICD-10-CM

## 2022-12-26 DIAGNOSIS — G8929 Other chronic pain: Secondary | ICD-10-CM

## 2022-12-26 NOTE — Progress Notes (Signed)
Robin Payor, PhD, LAT, ATC acting as a scribe for Robin Graham, MD.  [name] is a 89 y.o. female who presents to [facility] at [facility] today for R hip pain. Pt was previously seen by Dr. [name] on 08/31/22 for bilat shoulder pain.   Today, pt c/o hip pain ongoing for about 1-2 wks. No falls or injury.  Pt locates pain to the lateral aspect of the R hip, w/ radiating pain anteriorly and posteriorly.   Low back pain: yes Radiating pain: yes LE numbness/tingling: no LE weakness: yes Aggravates: walking, bending over Treatments tried: husband rubs it w/ ice hot, IBU, Tylenol  Dx imaging: 04/03/22 L-spine XR  Pertinent review of systems: No fevers or chills  Relevant historical information: Hypertension   Exam:  BP 130/84   Pulse 94   Ht 5' (1.524 m)   Wt 183 lb (83 kg)   SpO2 (!) 86%   BMI 35.74 kg/m  General: Well Developed, well nourished, and in no acute distress.   MSK: L-spine: Normal appearing Nontender palpation spinal midline.  Tender palpation right lumbar paraspinal musculature. Decreased lumbar motion Lower extremity strength is intact except noted below. Reflexes are intact.  Right hip: Normal-appearing Patient experiences pain in the buttocks area but is nontender in the hip and buttocks. Normal hip motion. Hip abduction and external rotation strength are mildly diminished 4/5 without reproduction of pain with this resisted strength testing.   Lab and Radiology Results  X-ray images L-spine and right hip obtained today personally and independently interpreted  Lumbar spine: High-level DDD without acute fractures  Right hip: No acute fractures.  No severe right hip DJD.  Await formal radiology review     Assessment and Plan: 89 y.o. female with right buttocks pain.  Pain is most likely referred pain from the low back more than a hip or hip muscle issue.  Plan for home health physical therapy trial.  She does not  drive so home health is needed.  Okay to use existing tramadol prescription.  Recommend heating pad.  Recommend Tylenol.  Recommend avoiding high-dose oral NSAIDs given hypertension.   Recheck in 1 month or sooner if needed.   PDMP not reviewed this encounter. Orders Placed This Encounter  Procedures   DG HIP UNILAT W OR W/O PELVIS 2-3 VIEWS RIGHT    Standing Status:   Future    Number of Occurrences:   1    Standing Expiration Date:   01/26/2023    Order Specific Question:   Reason for Exam (SYMPTOM  OR DIAGNOSIS REQUIRED)    Answer:   right hip pain    Order Specific Question:   Preferred imaging location?    Answer:   [name]   DG Lumbar Spine 2-3 Views    Standing Status:   Future    Number of Occurrences:   1    Standing Expiration Date:   12/26/2023    Order Specific Question:   Reason for Exam (SYMPTOM  OR DIAGNOSIS REQUIRED)    Answer:   eval rt low back pain    Order Specific Question:   Preferred imaging location?    Answer:   [name]   Ambulatory referral to Home Health    Referral Priority:   Routine    Referral Type:   Home Health Care    Referral Reason:   Specialty Services Required    Requested Specialty:   [facility]  Number of Visits Requested:   1   No orders of the defined types were placed in this encounter.    Discussed warning signs or symptoms. Please see discharge instructions. Patient expresses understanding.   The above documentation has been reviewed and is accurate and complete Robin Juarez, M.D.

## 2022-12-26 NOTE — Patient Instructions (Addendum)
Thank you for coming in today.   Please get an Xray today before you leave   Plan for home health PT.   Use a heating pad.   If it is really bad ok to take your tramadol.   Recheck in 1 month.

## 2022-12-27 ENCOUNTER — Ambulatory Visit: Payer: Medicare Other | Admitting: Family Medicine

## 2022-12-28 ENCOUNTER — Telehealth: Payer: Self-pay

## 2022-12-28 NOTE — Telephone Encounter (Signed)
Evon Slack, CMA  Rodolph Bong, MD; Etta Quill, CMA Not sure how you would like to proceed       Previous Messages    ----- Message ----- From: Monia Pouch Sent: 12/27/2022  11:09 AM EDT To: Evon Slack, CMA Subject: Referral FU                                    Morning Luanna Cole,  The office reached out to me to let you all know they received the referral for Mrs. Stafford. Unfortunately, they state we can not see her. Also we do not have the capability to perform any Ionto, Phonoph, Tens, Heat in Home Health as well.  If there is anything else I can assist with please let me know!

## 2022-12-29 NOTE — Telephone Encounter (Signed)
Lets try different home health agency.

## 2022-12-29 NOTE — Telephone Encounter (Signed)
Sent message to [employer]

## 2022-12-29 NOTE — Telephone Encounter (Signed)
Can we get her set up for [facility] PT Eval? It's OK if they don't do ionto, Phonoph, or tens. Pt doesn't drive so she requires HH PT.   Thanks!  Per visit note 12/26/22:  Assessment and Plan: 89 y.o. female with right buttocks pain.  Pain is most likely referred pain from the low back more than a hip or hip muscle issue.   Plan for home health physical therapy trial.  She does not drive so home health is needed.  Okay to use existing tramadol prescription.  Recommend heating pad.  Recommend Tylenol.  Recommend avoiding high-dose oral NSAIDs given hypertension.     Recheck in 1 month or sooner if needed.

## 2023-01-01 NOTE — Progress Notes (Signed)
Lumbar spine x-ray shows arthritis and a condition where the spine can move more than it should. Home health physical therapy should help stabilize her back.

## 2023-01-01 NOTE — Progress Notes (Signed)
Hip and pelvis x-ray shows some arthritis in the right hip.  No definitive fracture is seen.

## 2023-01-08 ENCOUNTER — Ambulatory Visit: Payer: Medicare Other | Admitting: Internal Medicine

## 2023-01-08 ENCOUNTER — Encounter: Payer: Self-pay | Admitting: Internal Medicine

## 2023-01-08 VITALS — BP 120/72 | HR 92 | Temp 97.9°F | Ht 60.0 in | Wt 186.0 lb

## 2023-01-08 DIAGNOSIS — R300 Dysuria: Secondary | ICD-10-CM | POA: Diagnosis not present

## 2023-01-08 DIAGNOSIS — N3000 Acute cystitis without hematuria: Secondary | ICD-10-CM

## 2023-01-08 LAB — POC URINALSYSI DIPSTICK (AUTOMATED)
Bilirubin, UA: NEGATIVE
Glucose, UA: NEGATIVE
Ketones, UA: NEGATIVE
Nitrite, UA: NEGATIVE
Protein, UA: POSITIVE — AB
Spec Grav, UA: <=1.005 — AB (ref 1.010–1.025)
Urobilinogen, UA: 0.2 E.U./dL
pH, UA: 6 (ref 5.0–8.0)

## 2023-01-08 MED ORDER — CIPROFLOXACIN HCL 250 MG PO TABS: 250.0000 mg | ORAL_TABLET | Freq: Two times a day (BID) | ORAL | 1 refills | Status: DC

## 2023-01-08 NOTE — Progress Notes (Signed)
Subjective:    Patient ID: Robin Juarez, female    DOB: [birth year], 89 y.o.   MRN: [id number]  HPI Here due to urinary symptoms  Did change to amoxicillin last time--due to concern about cipro It didn't work that well--and she had to take it for a week Things did finally calm down  Now with 2 days of burning dysuria Some urgency---some mild increased frequency Is incontinent---no change  No fever Chronic back pain--no change  Has used azo  Current Outpatient Medications on File Prior to Visit  Medication Sig Dispense Refill   amLODipine (NORVASC) 2.5 MG tablet TAKE 1 TABLET BY MOUTH DAILY 90 tablet 3   aspirin EC 81 MG tablet Take 1 tablet (81 mg total) by mouth daily. Swallow whole. 30 tablet 12   Baclofen 5 MG TABS TAKE 1 TABLET BY MOUTH TWICE  DAILY AS NEEDED FOR MUSCLE  SPASMS 90 tablet 0   busPIRone (BUSPAR) 10 MG tablet as needed.     Cholecalciferol (VITAMIN D) 50 MCG (2000 UT) CAPS Take 2 capsules (4,000 Units total) by mouth daily.     Coenzyme Q10 (CO Q 10 PO) Take 1 tablet by mouth daily.     DULoxetine (CYMBALTA) 30 MG capsule Take 90 mg by mouth daily.      folic acid (FOLVITE) 800 MCG tablet Take 800 mcg by mouth daily.      furosemide (LASIX) 20 MG tablet TAKE 1 TABLET (20 MG TOTAL) BY MOUTH DAILY AS NEEDED FOR FLUID. 20 tablet 1   gabapentin (NEURONTIN) 300 MG capsule 1 tab noon, 1 tab evening, 2 tabs bedtime (4 tabs total)     KRILL OIL PO Take 100 mg by mouth daily.     lamoTRIgine (LAMICTAL) 100 MG tablet Take 1 tablet (100 mg total) by mouth daily after supper. (Patient taking differently: Take 150 mg by mouth daily after supper.)     metoprolol succinate (TOPROL-XL) 25 MG 24 hr tablet TAKE 1 TABLET BY MOUTH ONCE  DAILY 90 tablet 3   OLANZapine (ZYPREXA) 2.5 MG tablet Take 1 tablet (2.5 mg total) by mouth at bedtime.     traMADol (ULTRAM) 50 MG tablet Take by mouth.     No current facility-administered medications on file prior to visit.     Allergies  Allergen Reactions   Digoxin Other (See Comments)    Felt weak, tired, "awful"   Nitrofurantoin Other (See Comments)    Sharp pain back of head   Alendronate Sodium Other (See Comments)    cramps   Cephalosporins     She "felt bad" while taking multiple cephalosporins but has been able tolerate amoxicillin previously.  No clear cephalosporin allergy.   Chlordiazepoxide Hcl Other (See Comments)   Cyclobenzaprine     Other reaction(s): caused patient to panic   Lisinopril Itching and Swelling   Phenobarbital-Belladonna Alk Other (See Comments)   Praluent [Alirocumab]     myalgia   Repatha [Evolocumab]     myalgias   Ritalin [Methylphenidate Hcl]     intolerant   Ritalin [Methylphenidate]    Statins     Myalgia.  Intolerant of multiple statins, including pravastatin and simvastatin.   Trimethoprim     Wheezing lip swelling   Augmentin [Amoxicillin-Pot Clavulanate] Rash    In mouth   Cefaclor Other (See Comments)    Felt bad   Chlordiazepoxide Other (See Comments)   Clavulanic Acid Other (See Comments)    Not an allergy.  Tolerates plain  amoxil but not augmentin- diarrhea, etc with augmentin   Omeprazole Rash    rash   Oxcarbazepine Other (See Comments)    unknown   Sulfa Antibiotics Other (See Comments)    Sharp pain in back of head   Tetracycline Itching    itching   Zetia [Ezetimibe] Other (See Comments)    possible cause of aches and abnormal dreams, stopped 05/2014    Past Medical History:  Diagnosis Date   Arthritis    knee and hip OA- prev injection by Dr. Ethelene Hal   Blood transfusion without reported diagnosis    37 yrs ago -s/p back surgery   Chest pain    Coronary artery disease    50% mid LAD by cath January 2016   Depression 1993, 2000   Fibromyalgia    GERD (gastroesophageal reflux disease)    H/O hiatal hernia    Hyperlipidemia    Hypertension    OSA (obstructive sleep apnea)    off tx as of 2020, no sx as of 2020   Osteoporosis     Positive cardiac stress test    Stroke [facility])    Urinary incontinence    UTI (lower urinary tract infection)     Past Surgical History:  Procedure Laterality Date   ABDOMINAL HYSTERECTOMY  08/21/1974   APPENDECTOMY  08/21/1962   Back fusions  08/22/1975   missing vertebra   BACK SURGERY     fusions lumbar area   BILATERAL CARPAL TUNNEL RELEASE Bilateral 10/2022   BREAST BIOPSY  08/21/1993   x 2   BREAST BIOPSY  07/10/2012   Procedure: BREAST BIOPSY WITH NEEDLE LOCALIZATION;  Surgeon: Emelia Loron, MD;  Location: [facility];  Service: General;  Laterality: Right;   CARDIOVASCULAR STRESS TEST  08/22/2007   Normal, Dr. Mayford Knife   CARPAL TUNNEL RELEASE Bilateral 08/2022   CATARACT EXTRACTION, BILATERAL Bilateral 08/22/2003   Childbirth  1952,1956,1957,1960   CYSTOSCOPY N/A 11/25/2012   Procedure: CYSTOSCOPY;  Surgeon: Martina Sinner, MD;  Location: [facility];  Service: Urology;  Laterality: N/A;   dilatation and currettage  08/21/1962   LEFT HEART CATH AND CORONARY ANGIOGRAPHY N/A 06/15/2017   Procedure: LEFT HEART CATH AND CORONARY ANGIOGRAPHY;  Surgeon: Lennette Bihari, MD;  Location: [facility];  Service: Cardiovascular;  Laterality: N/A;   LEFT HEART CATHETERIZATION WITH CORONARY ANGIOGRAM N/A 09/03/2014   Procedure: LEFT HEART CATHETERIZATION WITH CORONARY ANGIOGRAM;  Surgeon: Runell Gess, MD;  Location: [facility] C[facility];  Service: Cardiovascular;  Laterality: N/A;   LUMBAR LAMINECTOMY/DECOMPRESSION MICRODISCECTOMY N/A 05/01/2013   Procedure: COMPLETE DECOMPRESSIVE LUMBAR LAMINECTOMY L3-L4 MICRODISCECTOMY L3-L4 ON THE LEFT;  Surgeon: Jacki Cones, MD;  Location: [facility];  Service: Orthopedics;  Laterality: N/A;   OVARIAN CYST REMOVAL  08/21/1962   PUBOVAGINAL SLING N/A 11/25/2012   Procedure: Leonides Grills;  Surgeon: Martina Sinner, MD;  Location[facility];  Service: Urology;  Laterality: N/A;     Family History  Problem Relation Age of Onset   Arthritis Mother    Stroke Father    Hypertension Father    Hypertension Sister    Dementia Sister    Aneurysm Sister        Brain   Parkinsonism Sister    Heart disease Brother        Died during CABG   Kidney disease Maternal Grandmother    Colon cancer Neg Hx    Breast cancer Neg Hx  Social History   Socioeconomic History   Marital status: Married    Spouse name: Not on file   Number of children: 4   Years of education: Not on file   Highest education level: Not on file  Occupational History   Occupation: RETIRED    Employer: RETIRED  Tobacco Use   Smoking status: Never    Passive exposure: Never   Smokeless tobacco: Never   Tobacco comments:    a small amount when 89 yo  Vaping Use   Vaping Use: Never used  Substance and Sexual Activity   Alcohol use: No    Alcohol/week: 0.0 standard drinks of alcohol   Drug use: No   Sexual activity: Not Currently  Other Topics Concern   Not on file  Social History Narrative   Married 1953, husband is well.   5 pregnancies, 4 live births, all well (5th was unexpect pregnancy and patient needed to terminate due to concurrent medical problems at the time)   11 grandchildren.  9 great grandchildren   Regular exercise:  No   Social Determinants of Health   Financial Resource Strain: Low Risk  (12/28/2021)   Overall Financial Resource Strain (CARDIA)    Difficulty of Paying Living Expenses: Not hard at all  Food Insecurity: No Food Insecurity (12/28/2021)   Hunger Vital Sign    Worried About Running Out of Food in the Last Year: Never true    Ran Out of Food in the Last Year: Never true  Transportation Needs: No Transportation Needs (12/28/2021)   PRAPARE - Administrator, Civil Service (Medical): No    Lack of Transportation (Non-Medical): No  Physical Activity: Inactive (05/18/2020)   Exercise Vital Sign    Days of Exercise per Week: 0 days    Minutes of  Exercise per Session: 0 min  Stress: No Stress Concern Present (12/28/2021)   [employer] of Occupational Health - Occupational Stress Questionnaire    Feeling of Stress : Not at all  Social Connections: Moderately Integrated (12/28/2021)   Social Connection and Isolation Panel [NHANES]    Frequency of Communication with Friends and Family: More than three times a week    Frequency of Social Gatherings with Friends and Family: Once a week    Attends Religious Services: More than 4 times per year    Active Member of [employer] or Organizations: No    Attends Banker Meetings: Never    Marital Status: Married  Catering manager Violence: Not At Risk (12/28/2021)   Humiliation, Afraid, Rape, and Kick questionnaire    Fear of Current or Ex-Partner: No    Emotionally Abused: No    Physically Abused: No    Sexually Abused: No   Review of Systems Eating okay No N/V     Objective:   Physical Exam Constitutional:      Appearance: Normal appearance.  Abdominal:     Palpations: Abdomen is soft.     Tenderness: There is no abdominal tenderness. There is no right CVA tenderness or left CVA tenderness.  Neurological:     Mental Status: She is alert.            Assessment & Plan:

## 2023-01-08 NOTE — Assessment & Plan Note (Signed)
Same symptoms as back in January Culture showed Klebsiella resistant to ampicillin and intermediate to nitrofurantoin Due to allergies and intolerance to augmentin--it looks like the cipro will be the best bet (and she has tolerated it multiple times) Cipro 250 bid x 3 days (1 refill just in case) Send culture

## 2023-01-08 NOTE — Addendum Note (Signed)
Addended by: Eual Fines on: 01/08/2023 12:21 PM   Modules accepted: Orders

## 2023-01-09 ENCOUNTER — Ambulatory Visit: Payer: Medicare Other | Admitting: Family Medicine

## 2023-01-10 ENCOUNTER — Ambulatory Visit (INDEPENDENT_AMBULATORY_CARE_PROVIDER_SITE_OTHER): Payer: Medicare Other

## 2023-01-10 VITALS — Ht 60.0 in | Wt 183.0 lb

## 2023-01-10 DIAGNOSIS — Z0000 Encounter for general adult medical examination without abnormal findings: Secondary | ICD-10-CM

## 2023-01-10 LAB — URINE CULTURE: MICRO NUMBER:: 14978537

## 2023-01-10 NOTE — Patient Instructions (Signed)
Robin Juarez , Thank you for taking time to come for your Medicare Wellness Visit. I appreciate your ongoing commitment to your health goals. Please review the following plan we discussed and let me know if I can assist you in the future.   These are the goals we discussed:  Goals      DIET - EAT MORE FRUITS AND VEGETABLES     LIFESTYLE - EAT BREAKFAST     Starting 09/10/2017, I will continue to drink a healthy smoothie for breakfast.      Patient Stated     05/18/2020, I will maintain and continue medications as prescribed.      Patient Stated     No new goals        This is a list of the screening recommended for you and due dates:  Health Maintenance  Topic Date Due   Mammogram  07/20/2021   COVID-19 Vaccine (5 - 2023-24 season) 04/21/2022   Flu Shot  03/22/2023   Medicare Annual Wellness Visit  01/10/2024   DTaP/Tdap/Td vaccine (4 - Td or Tdap) 04/21/2024   Pneumonia Vaccine  Completed   DEXA scan (bone density measurement)  Completed   Zoster (Shingles) Vaccine  Completed   HPV Vaccine  Aged Out    Advanced directives: Please bring a copy of your health care power of attorney and living will to the office to be added to your chart at your convenience.   Conditions/risks identified: Aim for 30 minutes of exercise or brisk walking, 6-8 glasses of water, and 5 servings of fruits and vegetables each day.   Next appointment: Follow up in one year for your annual wellness visit 01/15/24 @ 11:15 televisit   Preventive Care 65 Years and Older, Female Preventive care refers to lifestyle choices and visits with your health care provider that can promote health and wellness. What does preventive care include? A yearly physical exam. This is also called an annual well check. Dental exams once or twice a year. Routine eye exams. Ask your health care provider how often you should have your eyes checked. Personal lifestyle choices, including: Daily care of your teeth and  gums. Regular physical activity. Eating a healthy diet. Avoiding tobacco and drug use. Limiting alcohol use. Practicing safe sex. Taking low-dose aspirin every day. Taking vitamin and mineral supplements as recommended by your health care provider. What happens during an annual well check? The services and screenings done by your health care provider during your annual well check will depend on your age, overall health, lifestyle risk factors, and family history of disease. Counseling  Your health care provider may ask you questions about your: Alcohol use. Tobacco use. Drug use. Emotional well-being. Home and relationship well-being. Sexual activity. Eating habits. History of falls. Memory and ability to understand (cognition). Work and work Astronomer. Reproductive health. Screening  You may have the following tests or measurements: Height, weight, and BMI. Blood pressure. Lipid and cholesterol levels. These may be checked every 5 years, or more frequently if you are over 31 years old. Skin check. Lung cancer screening. You may have this screening every year starting at age 30 if you have a 30-pack-year history of smoking and currently smoke or have quit within the past 15 years. Fecal occult blood test (FOBT) of the stool. You may have this test every year starting at age 68. Flexible sigmoidoscopy or colonoscopy. You may have a sigmoidoscopy every 5 years or a colonoscopy every 10 years starting at age 53.  Hepatitis C blood test. Hepatitis B blood test. Sexually transmitted disease (STD) testing. Diabetes screening. This is done by checking your blood sugar (glucose) after you have not eaten for a while (fasting). You may have this done every 1-3 years. Bone density scan. This is done to screen for osteoporosis. You may have this done starting at age 68. Mammogram. This may be done every 1-2 years. Talk to your health care provider about how often you should have regular  mammograms. Talk with your health care provider about your test results, treatment options, and if necessary, the need for more tests. Vaccines  Your health care provider may recommend certain vaccines, such as: Influenza vaccine. This is recommended every year. Tetanus, diphtheria, and acellular pertussis (Tdap, Td) vaccine. You may need a Td booster every 10 years. Zoster vaccine. You may need this after age 69. Pneumococcal 13-valent conjugate (PCV13) vaccine. One dose is recommended after age 81. Pneumococcal polysaccharide (PPSV23) vaccine. One dose is recommended after age 17. Talk to your health care provider about which screenings and vaccines you need and how often you need them. This information is not intended to replace advice given to you by your health care provider. Make sure you discuss any questions you have with your health care provider. Document Released: 09/03/2015 Document Revised: 04/26/2016 Document Reviewed: 06/08/2015 Elsevier Interactive Patient Education  2017 [employer].  Fall Prevention in the Home Falls can cause injuries. They can happen to people of all ages. There are many things you can do to make your home safe and to help prevent falls. What can I do on the outside of my home? Regularly fix the edges of walkways and driveways and fix any cracks. Remove anything that might make you trip as you walk through a door, such as a raised step or threshold. Trim any bushes or trees on the path to your home. Use bright outdoor lighting. Clear any walking paths of anything that might make someone trip, such as rocks or tools. Regularly check to see if handrails are loose or broken. Make sure that both sides of any steps have handrails. Any raised decks and porches should have guardrails on the edges. Have any leaves, snow, or ice cleared regularly. Use sand or salt on walking paths during winter. Clean up any spills in your garage right away. This includes oil  or grease spills. What can I do in the bathroom? Use night lights. Install grab bars by the toilet and in the tub and shower. Do not use towel bars as grab bars. Use non-skid mats or decals in the tub or shower. If you need to sit down in the shower, use a plastic, non-slip stool. Keep the floor dry. Clean up any water that spills on the floor as soon as it happens. Remove soap buildup in the tub or shower regularly. Attach bath mats securely with double-sided non-slip rug tape. Do not have throw rugs and other things on the floor that can make you trip. What can I do in the bedroom? Use night lights. Make sure that you have a light by your bed that is easy to reach. Do not use any sheets or blankets that are too big for your bed. They should not hang down onto the floor. Have a firm chair that has side arms. You can use this for support while you get dressed. Do not have throw rugs and other things on the floor that can make you trip. What can I do in  the kitchen? Clean up any spills right away. Avoid walking on wet floors. Keep items that you use a lot in easy-to-reach places. If you need to reach something above you, use a strong step stool that has a grab bar. Keep electrical cords out of the way. Do not use floor polish or wax that makes floors slippery. If you must use wax, use non-skid floor wax. Do not have throw rugs and other things on the floor that can make you trip. What can I do with my stairs? Do not leave any items on the stairs. Make sure that there are handrails on both sides of the stairs and use them. Fix handrails that are broken or loose. Make sure that handrails are as long as the stairways. Check any carpeting to make sure that it is firmly attached to the stairs. Fix any carpet that is loose or worn. Avoid having throw rugs at the top or bottom of the stairs. If you do have throw rugs, attach them to the floor with carpet tape. Make sure that you have a light  switch at the top of the stairs and the bottom of the stairs. If you do not have them, ask someone to add them for you. What else can I do to help prevent falls? Wear shoes that: Do not have high heels. Have rubber bottoms. Are comfortable and fit you well. Are closed at the toe. Do not wear sandals. If you use a stepladder: Make sure that it is fully opened. Do not climb a closed stepladder. Make sure that both sides of the stepladder are locked into place. Ask someone to hold it for you, if possible. Clearly mark and make sure that you can see: Any grab bars or handrails. First and last steps. Where the edge of each step is. Use tools that help you move around (mobility aids) if they are needed. These include: Canes. Walkers. Scooters. Crutches. Turn on the lights when you go into a dark area. Replace any light bulbs as soon as they burn out. Set up your furniture so you have a clear path. Avoid moving your furniture around. If any of your floors are uneven, fix them. If there are any pets around you, be aware of where they are. Review your medicines with your doctor. Some medicines can make you feel dizzy. This can increase your chance of falling. Ask your doctor what other things that you can do to help prevent falls. This information is not intended to replace advice given to you by your health care provider. Make sure you discuss any questions you have with your health care provider. Document Released: 06/03/2009 Document Revised: 01/13/2016 Document Reviewed: 09/11/2014 Elsevier Interactive Patient Education  2017 [employer].

## 2023-01-10 NOTE — Progress Notes (Addendum)
I connected with  Robin Juarez on 01/10/23 by a audio enabled telemedicine application and verified that I am speaking with the correct person using two identifiers.  Patient Location: Home  Provider Location: Home Office  I discussed the limitations of evaluation and management by telemedicine. The patient expressed understanding and agreed to proceed.  Subjective:   Robin Juarez is a 89 y.o. female who presents for Medicare Annual (Subsequent) preventive examination.  Review of Systems      Cardiac Risk Factors include: advanced age (>31men, >70 women);hypertension;sedentary lifestyle     Objective:    Today's Vitals   01/10/23 1159  Weight: 183 lb (83 kg)  Height: 5' (1.524 m)   Body mass index is 35.74 kg/m.     01/10/2023   12:09 PM 12/28/2021    1:09 PM 10/17/2021    7:17 PM 09/05/2021    7:14 PM 09/03/2021    5:24 AM 01/31/2021    4:34 AM 05/18/2020    3:36 PM  Advanced Directives  Does Patient Have a Medical Advance Directive? Yes No No Yes Yes Yes Yes  Type of Estate agent of [place];Living will   Healthcare Power of [employer] of [place];Living will Healthcare Power of [place];Living will Healthcare Power of [place];Living will  Does patient want to make changes to medical advance directive?    No - Patient declined  No - Patient declined   Copy of Healthcare Power of Attorney in Chart? No - copy requested   No - copy requested No - copy requested Yes - validated most recent copy scanned in chart (See row information) No - copy requested  Would patient like information on creating a medical advance directive?  No - Patient declined No - Patient declined        Current Medications (verified) Outpatient Encounter Medications as of 01/10/2023  Medication Sig   amLODipine (NORVASC) 2.5 MG tablet TAKE 1 TABLET BY MOUTH DAILY   aspirin EC 81 MG tablet Take 1 tablet (81 mg total) by mouth daily. Swallow whole.   Baclofen  5 MG TABS TAKE 1 TABLET BY MOUTH TWICE  DAILY AS NEEDED FOR MUSCLE  SPASMS   busPIRone (BUSPAR) 10 MG tablet as needed.   Cholecalciferol (VITAMIN D) 50 MCG (2000 UT) CAPS Take 2 capsules (4,000 Units total) by mouth daily.   ciprofloxacin (CIPRO) 250 MG tablet Take 1 tablet (250 mg total) by mouth 2 (two) times daily.   Coenzyme Q10 (CO Q 10 PO) Take 1 tablet by mouth daily.   DULoxetine (CYMBALTA) 30 MG capsule Take 90 mg by mouth daily.    folic acid (FOLVITE) 800 MCG tablet Take 800 mcg by mouth daily.    furosemide (LASIX) 20 MG tablet TAKE 1 TABLET (20 MG TOTAL) BY MOUTH DAILY AS NEEDED FOR FLUID.   gabapentin (NEURONTIN) 300 MG capsule 1 tab noon, 1 tab evening, 2 tabs bedtime (4 tabs total)   KRILL OIL PO Take 100 mg by mouth daily.   lamoTRIgine (LAMICTAL) 100 MG tablet Take 1 tablet (100 mg total) by mouth daily after supper. (Patient taking differently: Take 150 mg by mouth daily after supper.)   metoprolol succinate (TOPROL-XL) 25 MG 24 hr tablet TAKE 1 TABLET BY MOUTH ONCE  DAILY   OLANZapine (ZYPREXA) 2.5 MG tablet Take 1 tablet (2.5 mg total) by mouth at bedtime.   traMADol (ULTRAM) 50 MG tablet Take by mouth.   No facility-administered encounter medications on file as of  01/10/2023.    Allergies (verified) Digoxin, Nitrofurantoin, Alendronate sodium, Cephalosporins, Chlordiazepoxide hcl, Cyclobenzaprine, Lisinopril, Phenobarbital-belladonna alk, Praluent [alirocumab], Repatha [evolocumab], Ritalin [methylphenidate hcl], Ritalin [methylphenidate], Statins, Trimethoprim, Augmentin [amoxicillin-pot clavulanate], Cefaclor, Chlordiazepoxide, Clavulanic acid, Omeprazole, Oxcarbazepine, Sulfa antibiotics, Tetracycline, and Zetia [ezetimibe]   History: Past Medical History:  Diagnosis Date   Arthritis    knee and hip OA- prev injection by Dr. Ethelene Hal   Blood transfusion without reported diagnosis    37 yrs ago -s/p back surgery   Chest pain    Coronary artery disease    50% mid LAD  by cath January 2016   Depression 1993, 2000   Fibromyalgia    GERD (gastroesophageal reflux disease)    H/O hiatal hernia    Hyperlipidemia    Hypertension    OSA (obstructive sleep apnea)    off tx as of 2020, no sx as of 2020   Osteoporosis    Positive cardiac stress test    Stroke [facility])    Urinary incontinence    UTI (lower urinary tract infection)    Past Surgical History:  Procedure Laterality Date   ABDOMINAL HYSTERECTOMY  08/21/1974   APPENDECTOMY  08/21/1962   Back fusions  08/22/1975   missing vertebra   BACK SURGERY     fusions lumbar area   BILATERAL CARPAL TUNNEL RELEASE Bilateral 10/2022   BREAST BIOPSY  08/21/1993   x 2   BREAST BIOPSY  07/10/2012   Procedure: BREAST BIOPSY WITH NEEDLE LOCALIZATION;  Surgeon: Emelia Loron, MD;  Location: [facility];  Service: General;  Laterality: Right;   CARDIOVASCULAR STRESS TEST  08/22/2007   Normal, Dr. Mayford Knife   CARPAL TUNNEL RELEASE Bilateral 08/2022   CATARACT EXTRACTION, BILATERAL Bilateral 08/22/2003   Childbirth  1952,1956,1957,1960   CYSTOSCOPY N/A 11/25/2012   Procedure: CYSTOSCOPY;  Surgeon: Martina Sinner, MD;  Location: [facility];  Service: Urology;  Laterality: N/A;   dilatation and currettage  08/21/1962   LEFT HEART CATH AND CORONARY ANGIOGRAPHY N/A 06/15/2017   Procedure: LEFT HEART CATH AND CORONARY ANGIOGRAPHY;  Surgeon: Lennette Bihari, MD;  Location: [facility];  Service: Cardiovascular;  Laterality: N/A;   LEFT HEART CATHETERIZATION WITH CORONARY ANGIOGRAM N/A 09/03/2014   Procedure: LEFT HEART CATHETERIZATION WITH CORONARY ANGIOGRAM;  Surgeon: Runell Gess, MD;  Location: [facility] CATH LAB;  Service: Cardiovascular;  Laterality: N/A;   LUMBAR LAMINECTOMY/DECOMPRESSION MICRODISCECTOMY N/A 05/01/2013   Procedure: COMPLETE DECOMPRESSIVE LUMBAR LAMINECTOMY L3-L4 MICRODISCECTOMY L3-L4 ON THE LEFT;  Surgeon: Jacki Cones, MD;  Location: [facility];  Service:  Orthopedics;  Laterality: N/A;   OVARIAN CYST REMOVAL  08/21/1962   PUBOVAGINAL SLING N/A 11/25/2012   Procedure: Leonides Grills;  Surgeon: Martina Sinner, MD;  Location: [facility];  Service: Urology;  Laterality: N/A;   Family History  Problem Relation Age of Onset   Arthritis Mother    Stroke Father    Hypertension Father    Hypertension Sister    Dementia Sister    Aneurysm Sister        Brain   Parkinsonism Sister    Heart disease Brother        Died during CABG   Kidney disease Maternal Grandmother    Colon cancer Neg Hx    Breast cancer Neg Hx    Social History   Socioeconomic History   Marital status: Married    Spouse name: Not on file   Number of children: 4  Years of education: Not on file   Highest education level: Not on file  Occupational History   Occupation: RETIRED    Employer: RETIRED  Tobacco Use   Smoking status: Never    Passive exposure: Never   Smokeless tobacco: Never   Tobacco comments:    a small amount when 89 yo  Vaping Use   Vaping Use: Never used  Substance and Sexual Activity   Alcohol use: No    Alcohol/week: 0.0 standard drinks of alcohol   Drug use: No   Sexual activity: Not Currently  Other Topics Concern   Not on file  Social History Narrative   Married 1953, husband is well.   5 pregnancies, 4 live births, all well (5th was unexpect pregnancy and patient needed to terminate due to concurrent medical problems at the time)   11 grandchildren.  9 great grandchildren   Regular exercise:  No   Social Determinants of Health   Financial Resource Strain: Low Risk  (01/10/2023)   Overall Financial Resource Strain (CARDIA)    Difficulty of Paying Living Expenses: Not hard at all  Food Insecurity: No Food Insecurity (01/10/2023)   Hunger Vital Sign    Worried About Running Out of Food in the Last Year: Never true    Ran Out of Food in the Last Year: Never true  Transportation Needs: No Transportation Needs  (01/10/2023)   PRAPARE - Administrator, Civil Service (Medical): No    Lack of Transportation (Non-Medical): No  Physical Activity: Inactive (01/10/2023)   Exercise Vital Sign    Days of Exercise per Week: 0 days    Minutes of Exercise per Session: 0 min  Stress: No Stress Concern Present (01/10/2023)   [employer] of Occupational Health - Occupational Stress Questionnaire    Feeling of Stress : Not at all  Social Connections: Moderately Integrated (01/10/2023)   Social Connection and Isolation Panel [NHANES]    Frequency of Communication with Friends and Family: More than three times a week    Frequency of Social Gatherings with Friends and Family: More than three times a week    Attends Religious Services: More than 4 times per year    Active Member of [employer] or Organizations: No    Attends Engineer, structural: Never    Marital Status: Married    Tobacco Counseling Counseling given: Not Answered Tobacco comments: a small amount when 89 yo   Clinical Intake:  Pre-visit preparation completed: Yes  Pain : No/denies pain     Nutritional Risks: None Diabetes: No  How often do you need to have someone help you when you read instructions, pamphlets, or other written materials from your doctor or pharmacy?: 1 - Never  Diabetic? no  Interpreter Needed?: No  Information entered by :: C.Jozee Hammer LPN   Activities of Daily Living    01/10/2023   12:10 PM  In your present state of health, do you have any difficulty performing the following activities:  Hearing? 0  Vision? 0  Difficulty concentrating or making decisions? 1  Comment occasionally  Walking or climbing stairs? 1  Comment Feels "wobbly on feet"  Dressing or bathing? 0  Doing errands, shopping? 1  Comment Husband assists  Preparing Food and eating ? N  Using the Toilet? N  In the past six months, have you accidently leaked urine? Y  Comment wears pads  Do you have problems with loss  of bowel control? N  Managing your Medications?  N  Managing your Finances? N  Housekeeping or managing your Housekeeping? N    Patient Care Team: Joaquim Nam, MD as PCP - General Allyson Sabal Delton See, MD as PCP - Cardiology (Cardiology) Lynden Ang, MD as Consulting Physician (Psychiatry) Runell Gess, MD as Consulting Physician (Cardiology) Cathie Hoops, MD as Consulting Physician (Obstetrics and Gynecology) Thereasa Solo as Referring Physician (Optometry) Bartholomew Boards, DDS as Referring Physician (Dentistry)  Indicate any recent Medical Services you may have received from other than [facility] providers in the past year (date may be approximate).     Assessment:   This is a routine wellness examination for Robin Juarez.  Hearing/Vision screen Hearing Screening - Comments:: No aids Vision Screening - Comments:: Glasses - [facility]  Dietary issues and exercise activities discussed: Current Exercise Habits: The patient does not participate in regular exercise at present, Exercise limited by: None identified   Goals Addressed             This Visit's Progress    Patient Stated       No new goals       Depression Screen    01/10/2023   12:08 PM 10/19/2022    2:45 PM 12/28/2021    1:06 PM 05/18/2020    3:38 PM 09/10/2017   11:20 AM 08/05/2015    2:13 PM 03/02/2014   10:52 AM  PHQ 2/9 Scores  PHQ - 2 Score 0 3 1 0 0 0 0  PHQ- 9 Score  8  0 0      Fall Risk    01/10/2023   12:10 PM 10/19/2022    2:46 PM 12/28/2021    1:10 PM 10/03/2021    2:07 PM 05/18/2020    3:37 PM  Fall Risk   Falls in the past year? 0 0 0 0 0  Number falls in past yr: 0 0 0 0 0  Injury with Fall? 0 0 0 0 0  Risk for fall due to : No Fall Risks No Fall Risks No Fall Risks No Fall Risks Medication side effect  Follow up Falls prevention discussed;Falls evaluation completed Falls evaluation completed Falls evaluation completed Falls evaluation completed Falls evaluation completed;Falls  prevention discussed    FALL RISK PREVENTION PERTAINING TO THE HOME:  Any stairs in or around the home? Yes  If so, are there any without handrails? No  Home free of loose throw rugs in walkways, pet beds, electrical cords, etc? Yes  Adequate lighting in your home to reduce risk of falls? Yes   ASSISTIVE DEVICES UTILIZED TO PREVENT FALLS:  Life alert? Yes  Use of a cane, walker or w/c? Yes  Grab bars in the bathroom? Yes  Shower chair or bench in shower? Yes  Elevated toilet seat or a handicapped toilet? Yes    Cognitive Function:    05/18/2020    3:40 PM 09/10/2017   11:28 AM  MMSE - Mini Mental State Exam  Orientation to time 5 5  Orientation to Place 5 5  Registration 3 3  Attention/ Calculation 5 0  Recall 3 3  Language- name 2 objects  0  Language- repeat 1 1  Language- follow 3 step command  3  Language- read & follow direction  0  Write a sentence  0  Copy design  0  Total score  20        01/10/2023   12:11 PM 12/28/2021    1:12 PM  6CIT Screen  What Year? 0  points 0 points  What month? 0 points 0 points  What time? 0 points 0 points  Count back from 20 0 points 0 points  Months in reverse 0 points 0 points  Repeat phrase 4 points 4 points  Total Score 4 points 4 points    Immunizations Immunization History  Administered Date(s) Administered   Fluad Quad(high Dose 65+) 05/22/2019, 06/02/2020, 06/07/2022   Influenza Split 05/27/2009, 06/01/2011, 05/03/2012   Influenza Whole 06/15/2010   Influenza, High Dose Seasonal PF 06/07/2021   Influenza,inj,Quad PF,6+ Mos 06/10/2013, 06/18/2014, 05/25/2015, 05/23/2016, 06/06/2017, 06/13/2018   Influenza,inj,quad, With Preservative 05/22/2019   PFIZER(Purple Top)SARS-COV-2 Vaccination 11/03/2019, 11/26/2019, 06/24/2020   Pfizer Covid-19 Vaccine Bivalent Booster 24yrs & up 05/02/2021   Pneumococcal Conjugate-13 11/17/2014   Pneumococcal Polysaccharide-23 08/21/1998, 05/02/1999   Td 08/22/2003, 07/29/2004   Tdap  04/21/2014   Zoster Recombinat (Shingrix) 05/01/2019, 07/29/2019   Zoster, Live 08/06/2006, 08/22/2007    TDAP status: Up to date  Flu Vaccine status: Up to date  Pneumococcal vaccine status: Up to date  Covid-19 vaccine status: Completed vaccines  Qualifies for Shingles Vaccine? Yes   Zostavax completed  unknown   Shingrix Completed?: Yes  Screening Tests Health Maintenance  Topic Date Due   MAMMOGRAM  07/20/2021   COVID-19 Vaccine (5 - 2023-24 season) 04/21/2022   INFLUENZA VACCINE  03/22/2023   Medicare Annual Wellness (AWV)  01/10/2024   DTaP/Tdap/Td (4 - Td or Tdap) 04/21/2024   Pneumonia Vaccine 44+ Years old  Completed   DEXA SCAN  Completed   Zoster Vaccines- Shingrix  Completed   HPV VACCINES  Aged Out    Health Maintenance  Health Maintenance Due  Topic Date Due   MAMMOGRAM  07/20/2021   COVID-19 Vaccine (5 - 2023-24 season) 04/21/2022    Colorectal cancer screening: No longer required.   Mammogram status: No longer required due to age.  Bone Density - Declined  Lung Cancer Screening: (Low Dose CT Chest recommended if Age 38-80 years, 30 pack-year currently smoking OR have quit w/in 15years.) does not qualify.   Lung Cancer Screening Referral: no  Additional Screening:  Hepatitis C Screening: does not qualify; Completed no  Vision Screening: Recommended annual ophthalmology exams for early detection of glaucoma and other disorders of the eye. Is the patient up to date with their annual eye exam?  Yes  Who is the provider or what is the name of the office in which the patient attends annual eye exams? [facility] If pt is not established with a provider, would they like to be referred to a provider to establish care? Yes .   Dental Screening: Recommended annual dental exams for proper oral hygiene  Community Resource Referral / Chronic Care Management: CRR required this visit?  No   CCM required this visit?  No      Plan:     I have  personally reviewed and noted the following in the patient's chart:   Medical and social history Use of alcohol, tobacco or illicit drugs  Current medications and supplements including opioid prescriptions. Patient is currently taking opioid prescriptions. Information provided to patient regarding non-opioid alternatives. Patient advised to discuss non-opioid treatment plan with their provider. Functional ability and status Nutritional status Physical activity Advanced directives List of other physicians Hospitalizations, surgeries, and ER visits in previous 12 months Vitals Screenings to include cognitive, depression, and falls Referrals and appointments  In addition, I have reviewed and discussed with patient certain preventive protocols, quality metrics, and best  practice recommendations. A written personalized care plan for preventive services as well as general preventive health recommendations were provided to patient.     Maryan Puls, LPN   1/61/0960   Nurse Notes: no concerns

## 2023-01-11 LAB — URINE CULTURE: SPECIMEN QUALITY:: ADEQUATE

## 2023-01-18 ENCOUNTER — Other Ambulatory Visit: Payer: Self-pay

## 2023-01-18 ENCOUNTER — Telehealth: Payer: Self-pay

## 2023-01-18 DIAGNOSIS — M545 Low back pain, unspecified: Secondary | ICD-10-CM

## 2023-01-18 MED ORDER — AMBULATORY NON FORMULARY MEDICATION
0 refills | Status: DC
Start: 2023-01-18 — End: 2023-07-05

## 2023-01-18 NOTE — Telephone Encounter (Signed)
The nurse from home health called in regard to patient needing a DME Rolator walker with seat faxed to [facility]. Fax number [phone]

## 2023-01-18 NOTE — Telephone Encounter (Signed)
Rx made and fax sent

## 2023-02-02 ENCOUNTER — Telehealth: Payer: Self-pay | Admitting: Family Medicine

## 2023-02-02 NOTE — Telephone Encounter (Signed)
I spoke with Robin Juarez [facility] PT assistant; on 02/01/23 Robin Juarez saw pt and pt has been lightheaded on and off for a while but yesterday pt seemed worse than usual.pt was lightheaded and felt cold;pt said if turned head that would trigger the lightheadedness. BP on 02/01/23 was 124/80 P 68.pt has no CP,SOB, H/A or vision changes. I also spoke with pt and she said today no lightheadedness so far and pt feels OK;pt slept well last night. No other symptoms.pt will cb if condition changes or worsens and UC & ED precautions given and pt voiced understanding. Also notation Mr & Mrs Tremble 73st anniversary was on 02/01/23 . Sending FYI to Dr Para March and Para March pool.

## 2023-02-02 NOTE — Telephone Encounter (Signed)
[facility] [phone]  Light headedness, felt funny, not dizzy, worse when she is up Going on for a few days, seems to be a little worse yesterday, has not talked to her today

## 2023-02-04 NOTE — Telephone Encounter (Signed)
Noted, glad to hear about their anniversary.

## 2023-02-07 ENCOUNTER — Telehealth: Payer: Self-pay | Admitting: Family Medicine

## 2023-02-07 DIAGNOSIS — R42 Dizziness and giddiness: Secondary | ICD-10-CM

## 2023-02-07 NOTE — Telephone Encounter (Signed)
Patient is trying to get I with [facility] to get evaluated for the dizziness that she has been experiencing,however they told her that she would need a referral sent over to them.

## 2023-02-08 NOTE — Telephone Encounter (Signed)
I put in the referral.  Thanks.  

## 2023-02-08 NOTE — Addendum Note (Signed)
Addended by: Joaquim Nam on: 02/08/2023 03:15 PM   Modules accepted: Orders

## 2023-02-08 NOTE — Telephone Encounter (Signed)
Called and advised patients husband

## 2023-02-15 ENCOUNTER — Telehealth: Payer: Self-pay | Admitting: Family Medicine

## 2023-02-15 NOTE — Telephone Encounter (Signed)
I spoke with pt; pt said on and off for 6 months pt has short episodes of lightheadedness if turns her head a certain way. Last lightheadedness happened earlier this morning. Pt does not have any CP,SOB,H/A,dizziness or vision changes now.offered pt appt this afternoon and pt only wants to see dr Para March. Pt already scheduled for appt with Dr Para March on 02/19/23 at 3 PM. UC & ED precautions given and pt voiced understanding and pt plans to rest this weekend and drink plenty of water.Earlier this morning BP 144/80 P70's and now BP 156/84 P 70.pt just walked from bathroom to living room and rested few mins before cked BP now. Sending note to Dr Para March and Para March pool. Pt request cb after reviewed by Dr Para March.

## 2023-02-15 NOTE — Telephone Encounter (Signed)
See if she can check her BP in the meantime and update [place] about that.  Thanks.

## 2023-02-15 NOTE — Telephone Encounter (Signed)
Pt called requesting an appt with Dr. Para March for light headedness. Pt states she feels light headed very often. Told pt Dr. Para March doesn't have anything available until Mon, 7/1. Offered pt appts with other providers, pt declined seeing someone else. Scheduled pt for Mon, 7/1. Transferred pt to access nurse. Call back # [phone]

## 2023-02-16 NOTE — Telephone Encounter (Signed)
I would keep the visit as scheduled.  I wouldn't change meds in the meantime. Thanks.

## 2023-02-16 NOTE — Telephone Encounter (Signed)
Called patient reviewed all information and repeated back to me. Will call if any questions.  ? ?

## 2023-02-19 ENCOUNTER — Encounter: Payer: Self-pay | Admitting: Family Medicine

## 2023-02-19 ENCOUNTER — Ambulatory Visit: Payer: Medicare Other | Admitting: Family Medicine

## 2023-02-19 VITALS — BP 120/66 | HR 95 | Temp 97.5°F | Ht 60.0 in | Wt 182.0 lb

## 2023-02-19 DIAGNOSIS — I10 Essential (primary) hypertension: Secondary | ICD-10-CM | POA: Diagnosis not present

## 2023-02-19 DIAGNOSIS — R300 Dysuria: Secondary | ICD-10-CM | POA: Diagnosis not present

## 2023-02-19 LAB — POC URINALSYSI DIPSTICK (AUTOMATED)
Bilirubin, UA: NEGATIVE
Blood, UA: 200
Glucose, UA: NEGATIVE
Ketones, UA: NEGATIVE
Nitrite, UA: NEGATIVE
Protein, UA: NEGATIVE
Spec Grav, UA: 1.01 (ref 1.010–1.025)
Urobilinogen, UA: 0.2 E.U./dL
pH, UA: 6 (ref 5.0–8.0)

## 2023-02-19 MED ORDER — CIPROFLOXACIN HCL 250 MG PO TABS: 250.0000 mg | ORAL_TABLET | Freq: Two times a day (BID) | ORAL | 0 refills | Status: DC

## 2023-02-19 MED ORDER — AMLODIPINE BESYLATE 2.5 MG PO TABS: 1.2500 mg | ORAL_TABLET | Freq: Every day | ORAL | Status: DC

## 2023-02-19 MED ORDER — LAMOTRIGINE 100 MG PO TABS: 150.0000 mg | ORAL_TABLET | Freq: Every day | ORAL | Status: DC

## 2023-02-19 NOTE — Patient Instructions (Addendum)
Take cipro twice a day for 5 days.   We'll culture your urine.  Keep drinking plenty of water.  Cut amlodipine back to 1/2 tab a day.  Update me next week.   Take care.  Glad to see you.

## 2023-02-19 NOTE — Progress Notes (Unsigned)
dysuria: yes duration of symptoms: stated a few days ago.   abdominal pain:no fevers:no back pain:at baseline.   vomiting:no  Episodically lightheaded.  She has been doing home therapy.  Longstanding but not daily.  She can turn her head and then she'll have sx.  It can usually happen when already up and standing, ie not immediately after standing. No syncope.  No room spinning.  She can feel lightheaded when sx happen.  She noted a ticking sound with neck ROM, ie could be nck crepitus.  She doesn't get lightheaded supine.    She can have lightheaded sx, then notes the clicking sound.    Meds, vitals, and allergies reviewed.  Per HPI unless specifically indicated in ROS section   GEN: nad, alert and oriented HEENT: ncat NECK: supple CV: rrr.  PULM: ctab, no inc wob ABD: soft, +bs, suprapubic area not tender EXT: no edema SKIN: no acute rash BACK: no CVA pain  No audible crepitus but she heard/felt it on neck ROM.   No bruit.    25 minutes were devoted to patient care in this encounter (this includes time spent reviewing the patient's file/history, interviewing and examining the patient, counseling/reviewing plan with patient).

## 2023-02-21 ENCOUNTER — Encounter: Payer: Self-pay | Admitting: Family Medicine

## 2023-02-21 ENCOUNTER — Other Ambulatory Visit: Payer: Self-pay

## 2023-02-21 ENCOUNTER — Ambulatory Visit: Payer: Medicare Other | Admitting: Family Medicine

## 2023-02-21 VITALS — BP 112/78 | HR 82 | Ht 60.0 in | Wt 180.0 lb

## 2023-02-21 DIAGNOSIS — M545 Low back pain, unspecified: Secondary | ICD-10-CM | POA: Diagnosis not present

## 2023-02-21 DIAGNOSIS — M4300 Spondylolysis, site unspecified: Secondary | ICD-10-CM

## 2023-02-21 DIAGNOSIS — R29898 Other symptoms and signs involving the musculoskeletal system: Secondary | ICD-10-CM | POA: Diagnosis not present

## 2023-02-21 DIAGNOSIS — M25512 Pain in left shoulder: Secondary | ICD-10-CM

## 2023-02-21 DIAGNOSIS — G8929 Other chronic pain: Secondary | ICD-10-CM | POA: Diagnosis not present

## 2023-02-21 DIAGNOSIS — M25511 Pain in right shoulder: Secondary | ICD-10-CM

## 2023-02-21 DIAGNOSIS — M19012 Primary osteoarthritis, left shoulder: Secondary | ICD-10-CM

## 2023-02-21 DIAGNOSIS — M19011 Primary osteoarthritis, right shoulder: Secondary | ICD-10-CM

## 2023-02-21 MED ORDER — TRAMADOL HCL 50 MG PO TABS: 50.0000 mg | ORAL_TABLET | Freq: Three times a day (TID) | ORAL | 0 refills | Status: DC | PRN

## 2023-02-21 NOTE — Patient Instructions (Addendum)
Thank you for coming in today.  You received an injection today. Seek immediate medical attention if the joint becomes red, extremely painful, or is oozing fluid.  You've ordered a MRI for your lower back and leg symptoms, let us know if you don't hear from them by the beginning of next week.

## 2023-02-21 NOTE — Progress Notes (Signed)
I, Stevenson Clinch, CMA acting as a scribe for [name], MD.  Robin Juarez is a 89 y.o. female who presents to [facility] today for cont'd bilat shoulder pain. Pt was last seen for her shoulders by Dr. [name] on 08/31/22 and was given repeat bilat GH steroid injections and we obtained a limited rheumatologic work-up.   Today, pt reports some relief after last injection. Didn't get as much relief as she would have hoped for. Continued pain in the arms. Pain when reaching over 90 degrees. Denies sharp shooting pains into the arms, pain described more as aching. Also c/o pain in BILAT lower legs. Notes pain radiating the length of the spine, going into the arms and legs yesterday. Notes continued soreness today. Also having soreness in the groin. Husband has been assisting her more over the past week or so d/t worsening sx.   She notes significant pain radiating down both legs starting in the anterior thighs and extending down to the anterior medial calves.  This is associated with weakness and difficulty walking.  She has an extensive history of bilateral pars defects and spondylolisthesis at L5-S1 as well as other degenerative changes in her lumbar spine.  She denies any bowel or bladder dysfunction.  Dx imaging: 11/15/21 R & L shoulder XR   Pertinent review of systems: No fevers or chills  Relevant historical information: Spondylolisthesis as above   Exam:  BP 112/78   Pulse 82   Ht 5' (1.524 m)   Wt 180 lb (81.6 kg)   SpO2 97%   BMI 35.15 kg/m  General: Well Developed, well nourished, and in no acute distress.   MSK: Shoulders bilaterally normal-appearing Decreased motion.  Intact strength within limits of motion.  L-spine: Normal appearing Nontender palpation spinal midline. Decreased lumbar motion. Lower extremity strength mildly decreased throughout with no specific isolated weaknesses. Reflexes and sensation are intact distally.  Lab  and Radiology Results  Procedure: Real-time Ultrasound Guided Injection of right shoulder glenohumeral joint posterior approach Device: Philips Affiniti 50G/GE Logiq Images permanently stored and available for review in PACS Verbal informed consent obtained.  Discussed risks and benefits of procedure. Warned about infection, bleeding, hyperglycemia damage to structures among others. Patient expresses understanding and agreement Time-out conducted.   Noted no overlying erythema, induration, or other signs of local infection.   Skin prepped in a sterile fashion.   Local anesthesia: Topical Ethyl chloride.   With sterile technique and under real time ultrasound guidance: 40 mg of Kenalog and 2 mL of Marcaine injected into shoulder joint. Fluid seen entering the joint capsule.   Completed without difficulty   Pain immediately resolved suggesting accurate placement of the medication.   Advised to call if fevers/chills, erythema, induration, drainage, or persistent bleeding.   Images permanently stored and available for review in the ultrasound unit.  Impression: Technically successful ultrasound guided injection.    Procedure: Real-time Ultrasound Guided Injection of left shoulder glenohumeral joint posterior approach Device: Philips Affiniti 50G/GE Logiq Images permanently stored and available for review in PACS Verbal informed consent obtained.  Discussed risks and benefits of procedure. Warned about infection, bleeding, hyperglycemia damage to structures among others. Patient expresses understanding and agreement Time-out conducted.   Noted no overlying erythema, induration, or other signs of local infection.   Skin prepped in a sterile fashion.   Local anesthesia: Topical Ethyl chloride.   With sterile technique and under real time ultrasound guidance: 40 mg of Kenalog and  2 mL of Marcaine injected into glenohumeral joint. Fluid seen entering the joint capsule.   Completed without  difficulty   Pain immediately resolved suggesting accurate placement of the medication.   Advised to call if fevers/chills, erythema, induration, drainage, or persistent bleeding.   Images permanently stored and available for review in the ultrasound unit.  Impression: Technically successful ultrasound guided injection.    EXAM: LUMBAR SPINE - 2-3 VIEW   COMPARISON:  April 03, 2022   FINDINGS: There are five non-rib bearing lumbar-type vertebral bodies with bilateral assimilation joints. There is mild levocurvature of the upper lumbar spine with RIGHT superior pelvic tilt. There is mild anterolisthesis of L3-4. There is grade 2 anterolisthesis of L5-S1, similar comparison to prior and due to bilateral pars defects. No acute compression fracture deformity. Severe intervertebral disc space height loss throughout the lumbar spine with multilevel endplate sclerosis and endplate proliferative changes. Preservation of the disc space at L4-5. Facet arthropathy. Atherosclerotic calcifications.   IMPRESSION: 1. Similar appearance of grade 2 anterolisthesis of L5-S1 due to bilateral pars defects. 2. Severe multilevel degenerative changes of the lumbar spine.     Electronically Signed   By: Meda Klinefelter M.D.   On: 12/30/2022 12:54 I, [name], personally (independently) visualized and performed the interpretation of the images attached in this note.      Assessment and Plan: 89 y.o. female with bilateral shoulder pain due to DJD.  Patient is experiencing acute exacerbation of chronic pain syndrome due to DJD.  Plan for repeat steroid injections bilaterally.  Additionally she is experiencing pain radiating down her legs bilaterally associated with some weakness.  This is a chronic issue with again an acute exacerbation.  She has multiple levels on her recent lumbar spine x-ray that could explain her pain.  Given the worsening neurologic symptoms plan for MRI lumbar spine and  anticipate epidural steroid injection.  For overall pain control temporarily we will prescribe tramadol.  She has tolerated it in the past.   PDMP reviewed during this encounter. Orders Placed This Encounter  Procedures   [place] LIMITED JOINT SPACE STRUCTURES UP BILAT(NO LINKED CHARGES)    Order Specific Question:   Reason for Exam (SYMPTOM  OR DIAGNOSIS REQUIRED)    Answer:   bilat shoulder pain    Order Specific Question:   Preferred imaging location?    Answer:   Ad[facility]   MR LUMBAR SPINE WO CONTRAST    Standing Status:   Future    Standing Expiration Date:   03/24/2023    Order Specific Question:   What is the patient's sedation requirement?    Answer:   No Sedation    Order Specific Question:   Does the patient have a pacemaker or implanted devices?    Answer:   No    Order Specific Question:   Preferred imaging location?    Answer:   Licensed conveyancer (table limit-350lbs)   Meds ordered this encounter  Medications   traMADol (ULTRAM) 50 MG tablet    Sig: Take 1 tablet (50 mg total) by mouth every 8 (eight) hours as needed for severe pain.    Dispense:  15 tablet    Refill:  0     Discussed warning signs or symptoms. Please see discharge instructions. Patient expresses understanding.   The above documentation has been reviewed and is accurate and complete [name], M.D.

## 2023-02-21 NOTE — Assessment & Plan Note (Signed)
Unclear how much her blood pressure is overtreated at this point.  Reasonable to cut back on amlodipine. She will cut amlodipine back to 1/2 tab a day.  Update me next week.   Routed to cardiology as FYI.

## 2023-02-21 NOTE — Assessment & Plan Note (Signed)
Presumed cystitis.  See notes on labs.  Urine culture pending.  Urinalysis discussed.  Take cipro twice a day for 5 days.   Keep drinking plenty of water.

## 2023-02-22 LAB — URINE CULTURE
MICRO NUMBER:: 15147275
SPECIMEN QUALITY:: ADEQUATE

## 2023-02-24 ENCOUNTER — Other Ambulatory Visit: Payer: Medicare Other

## 2023-02-26 ENCOUNTER — Telehealth: Payer: Self-pay | Admitting: Family Medicine

## 2023-02-26 NOTE — Telephone Encounter (Signed)
Patient called to let Dr [name] know that the shoulder injection she was given on 02/21/23 has helped her greatly! She said that her arms and legs were so achy before and it took care of everything.

## 2023-02-26 NOTE — Telephone Encounter (Signed)
Patient called in and stated that her blood pressure medication was cut in half. She stated that she felt bad Sunday and this morning she feel alright. She stated that some of her readings after cutting the pill in half have been 133/70 with a pulse of 72, 130/68 with a pulse of 80, 141/88 with a pulse of 9, 157/92 with a pulse of 95, and 149/88 with a pulse of 77. Thank you!

## 2023-02-26 NOTE — Telephone Encounter (Signed)
Left message on patients voicemail per dpr. Advised patient to call back with any questions.  

## 2023-02-26 NOTE — Telephone Encounter (Signed)
Patient called in again and wanted to add to her message that she is not experiencing any more light headedness.

## 2023-02-26 NOTE — Telephone Encounter (Signed)
I would continue as is.  Thanks for the update.   

## 2023-02-26 NOTE — Telephone Encounter (Signed)
Forwarding to Dr. Corey as FYI.  

## 2023-02-28 ENCOUNTER — Ambulatory Visit
Admission: RE | Admit: 2023-02-28 | Discharge: 2023-02-28 | Disposition: A | Discharge status: Home / Self Care | Payer: Medicare Other | Source: Ambulatory Visit | Attending: Family Medicine | Admitting: Family Medicine

## 2023-02-28 DIAGNOSIS — R29898 Other symptoms and signs involving the musculoskeletal system: Secondary | ICD-10-CM

## 2023-02-28 DIAGNOSIS — G8929 Other chronic pain: Secondary | ICD-10-CM

## 2023-02-28 DIAGNOSIS — M4300 Spondylolysis, site unspecified: Secondary | ICD-10-CM

## 2023-02-28 DIAGNOSIS — M545 Low back pain, unspecified: Secondary | ICD-10-CM

## 2023-03-05 ENCOUNTER — Ambulatory Visit: Payer: Medicare Other | Attending: Physician Assistant | Admitting: Physician Assistant

## 2023-03-05 ENCOUNTER — Other Ambulatory Visit: Payer: Self-pay

## 2023-03-05 ENCOUNTER — Encounter: Payer: Self-pay | Admitting: Physician Assistant

## 2023-03-05 VITALS — BP 108/66 | HR 82 | Ht 60.0 in | Wt 182.6 lb

## 2023-03-05 DIAGNOSIS — I2510 Atherosclerotic heart disease of native coronary artery without angina pectoris: Secondary | ICD-10-CM | POA: Diagnosis not present

## 2023-03-05 DIAGNOSIS — I10 Essential (primary) hypertension: Secondary | ICD-10-CM | POA: Diagnosis not present

## 2023-03-05 DIAGNOSIS — E785 Hyperlipidemia, unspecified: Secondary | ICD-10-CM | POA: Diagnosis not present

## 2023-03-05 NOTE — Patient Instructions (Addendum)
Medication Instructions:  STOP TAKING THE AMLODIPINE 2.5MG  *If you need a refill on your cardiac medications before your next appointment, please call your pharmacy*   Lab Work: NONE If you have labs (blood work) drawn today and your tests are completely normal, you will receive your results only by: MyChart Message (if you have MyChart) OR A paper copy in the mail If you have any lab test that is abnormal or we need to change your treatment, we will call you to review the results.   Testing/Procedures: NONE   Follow-Up: At [facility], you and your health needs are our priority.  As part of our continuing mission to provide you with exceptional heart care, we have created designated Provider Care Teams.  These Care Teams include your primary Cardiologist (physician) and Advanced Practice Providers (APPs -  Physician Assistants and Nurse Practitioners) who all work together to provide you with the care you need, when you need it.  We recommend signing up for the patient portal called "MyChart".  Sign up information is provided on this After Visit Summary.  MyChart is used to connect with patients for Virtual Visits (Telemedicine).  Patients are able to view lab/test results, encounter notes, upcoming appointments, etc.  Non-urgent messages can be sent to your provider as well.   To learn more about what you can do with MyChart, go to [email or web address].    Your next appointment:   6 month(s)  Provider:   Nanetta Batty, MD

## 2023-03-05 NOTE — Progress Notes (Unsigned)
  Cardiology Office Note:  .   Date:  03/05/2023  ID:  [name], DOB [birth year], MRN [id number] PCP: Joaquim Nam, MD  [facility] Providers Cardiologist:  Nanetta Batty, MD { Click to update primary MD,subspecialty MD or APP then REFRESH:1}   History of Present Illness: Robin Juarez   Robin Juarez is a 89 y.o. female with a hx of CAD, fibromyalgia, hypertension, hyperlipidemia, and a history of CVA.  She has significant family history of CAD with her brother having bypass surgery.  Myoview in 2016 was abnormal.  She ultimately underwent cardiac catheterization on 09/03/2014 that showed 50% mid LAD stenosis, otherwise normal coronary arteries.  Her chest pain was felt to be noncardiac.  She was readmitted in October 2018 with chest pain.  Echocardiogram showed normal EF with grade 1 DD.  Coronary CT obtained on 06/14/2017 demonstrated 25 to 50% proximal LAD, greater than 70% mid LAD lesion, 25 to 50% left circumflex lesion.  This was followed by repeat cardiac catheterization study on 06/15/2017 that showed 20% ostial LAD, 40% proximal LAD, 50% ostial D1, 30% ostial to proximal LAD, 20% proximal to mid left circumflex lesion.  No culprit lesion was identified.  Medical therapy was recommended.     Patient was previously seen by hypertension clinic.  She has multiple medication intolerances.  Echocardiogram and heart monitor in March 2023 was reassuring.  Heart monitor showed a 2 transient episode of SVT, neither 1 lasted very long.  She was seen in the ED in June 2023 with right arm pain and the bilateral lower extremity numbness.  CTA and MRI of the brain was negative.  Symptom resolved and has not came back since.  I asked her to take extra dose of metoprolol when she has palpitation.  She had COVID infection in December.  Patient presents today for follow-up.  She had a significant weakness during COVID infection in December of last year, this has since resolved.  Her blood pressure  was recently borderline low and her amlodipine was cut in half from 2.5 mg to 1.25 mg daily.  I will remove amlodipine from her medication list.  She denies any chest pain or shortness of breath.  She was able to walk an hour at local [employer] without any issue.  She can follow-up with Dr. Gery Pray in 45-month.  ROS: ***  Studies Reviewed: .        *** Risk Assessment/Calculations:   {Does this patient have ATRIAL FIBRILLATION?:[id number]}         Physical Exam:   VS:  BP 108/66   Pulse 82   Ht 5' (1.524 m)   Wt 182 lb 9.6 oz (82.8 kg)   SpO2 96%   BMI 35.66 kg/m    Wt Readings from Last 3 Encounters:  03/05/23 182 lb 9.6 oz (82.8 kg)  02/21/23 180 lb (81.6 kg)  02/19/23 182 lb (82.6 kg)    GEN: Well nourished, well developed in no acute distress NECK: No JVD; No carotid bruits CARDIAC: ***RRR, no murmurs, rubs, gallops RESPIRATORY:  Clear to auscultation without rales, wheezing or rhonchi  ABDOMEN: Soft, non-tender, non-distended EXTREMITIES:  No edema; No deformity   ASSESSMENT AND PLAN: .   ***    {Are you ordering a CV Procedure (e.g. stress test, cath, DCCV, TEE, etc)?   Press F2        :[id number]}  Dispo: ***  Signed, Azalee Course, PA

## 2023-03-09 ENCOUNTER — Telehealth: Payer: Self-pay | Admitting: Family Medicine

## 2023-03-09 ENCOUNTER — Encounter: Payer: Self-pay | Admitting: Family Medicine

## 2023-03-09 DIAGNOSIS — M5416 Radiculopathy, lumbar region: Secondary | ICD-10-CM

## 2023-03-09 DIAGNOSIS — G8929 Other chronic pain: Secondary | ICD-10-CM

## 2023-03-09 DIAGNOSIS — R29898 Other symptoms and signs involving the musculoskeletal system: Secondary | ICD-10-CM

## 2023-03-09 NOTE — Telephone Encounter (Signed)
ESI ordered. 

## 2023-03-09 NOTE — Progress Notes (Signed)
MRI of the lumbar spine shows diffuse arthritis.  Multiple areas of arthritis and potential pinched nerves are present. I have ordered an epidural steroid injection which should help. [facility] should call you soon.

## 2023-03-10 ENCOUNTER — Other Ambulatory Visit: Payer: Medicare Other

## 2023-03-15 ENCOUNTER — Ambulatory Visit
Admission: RE | Admit: 2023-03-15 | Discharge: 2023-03-15 | Disposition: A | Discharge status: Home / Self Care | Payer: Medicare Other | Source: Ambulatory Visit | Attending: Family Medicine | Admitting: Family Medicine

## 2023-03-15 DIAGNOSIS — R29898 Other symptoms and signs involving the musculoskeletal system: Secondary | ICD-10-CM

## 2023-03-15 DIAGNOSIS — M5416 Radiculopathy, lumbar region: Secondary | ICD-10-CM

## 2023-03-15 MED ORDER — IOPAMIDOL (ISOVUE-M 200) INJECTION 41%
1.0000 mL | Freq: Once | INTRAMUSCULAR | Status: AC
  Administered 2023-03-15: 1 mL via EPIDURAL

## 2023-03-15 MED ORDER — METHYLPREDNISOLONE ACETATE 40 MG/ML INJ SUSP (RADIOLOG
120.0000 mg | Freq: Once | INTRAMUSCULAR | Status: AC
  Administered 2023-03-15: 120 mg via EPIDURAL

## 2023-03-15 NOTE — Discharge Instructions (Signed)

## 2023-03-19 ENCOUNTER — Ambulatory Visit: Payer: Medicare Other | Admitting: Internal Medicine

## 2023-03-25 ENCOUNTER — Other Ambulatory Visit: Payer: Self-pay | Admitting: Family Medicine

## 2023-03-25 DIAGNOSIS — I10 Essential (primary) hypertension: Secondary | ICD-10-CM

## 2023-03-25 DIAGNOSIS — E559 Vitamin D deficiency, unspecified: Secondary | ICD-10-CM

## 2023-03-25 DIAGNOSIS — R202 Paresthesia of skin: Secondary | ICD-10-CM

## 2023-03-29 ENCOUNTER — Other Ambulatory Visit: Payer: Medicare Other

## 2023-03-29 DIAGNOSIS — R202 Paresthesia of skin: Secondary | ICD-10-CM | POA: Diagnosis not present

## 2023-03-29 DIAGNOSIS — I10 Essential (primary) hypertension: Secondary | ICD-10-CM

## 2023-03-29 DIAGNOSIS — E559 Vitamin D deficiency, unspecified: Secondary | ICD-10-CM

## 2023-03-29 LAB — CBC WITH DIFFERENTIAL/PLATELET
Basophils Absolute: 0.1 10*3/uL (ref 0.0–0.1)
Basophils Relative: 1.3 % (ref 0.0–3.0)
Eosinophils Absolute: 0.1 10*3/uL (ref 0.0–0.7)
Eosinophils Relative: 1.7 % (ref 0.0–5.0)
HCT: 41.4 % (ref 36.0–46.0)
Hemoglobin: 13.5 g/dL (ref 12.0–15.0)
Lymphocytes Relative: 24.4 % (ref 12.0–46.0)
Lymphs Abs: 2.1 10*3/uL (ref 0.7–4.0)
MCHC: 32.6 g/dL (ref 30.0–36.0)
MCV: 98.8 fl (ref 78.0–100.0)
Monocytes Absolute: 0.4 10*3/uL (ref 0.1–1.0)
Monocytes Relative: 4.9 % (ref 3.0–12.0)
Neutro Abs: 5.7 10*3/uL (ref 1.4–7.7)
Neutrophils Relative %: 67.7 % (ref 43.0–77.0)
Platelets: 254 10*3/uL (ref 150.0–400.0)
RBC: 4.19 Mil/uL (ref 3.87–5.11)
RDW: 14.5 % (ref 11.5–15.5)
WBC: 8.4 10*3/uL (ref 4.0–10.5)

## 2023-03-29 LAB — COMPREHENSIVE METABOLIC PANEL
ALT: 16 U/L (ref 0–35)
AST: 22 U/L (ref 0–37)
Albumin: 4.1 g/dL (ref 3.5–5.2)
Alkaline Phosphatase: 78 U/L (ref 39–117)
BUN: 10 mg/dL (ref 6–23)
CO2: 30 mEq/L (ref 19–32)
Calcium: 9.1 mg/dL (ref 8.4–10.5)
Chloride: 103 mEq/L (ref 96–112)
Creatinine, Ser: 0.75 mg/dL (ref 0.40–1.20)
GFR: 70.41 mL/min (ref >60.00)
Glucose, Bld: 93 mg/dL (ref 70–99)
Potassium: 4.2 mEq/L (ref 3.5–5.1)
Sodium: 140 mEq/L (ref 135–145)
Total Bilirubin: 0.6 mg/dL (ref 0.2–1.2)
Total Protein: 6.5 g/dL (ref 6.0–8.3)

## 2023-03-29 LAB — LIPID PANEL
Cholesterol: 238 mg/dL — ABNORMAL HIGH (ref 0–200)
HDL: 72.7 mg/dL (ref >39.00)
LDL Cholesterol: 149 mg/dL — ABNORMAL HIGH (ref 0–99)
NonHDL: 165.76
Total CHOL/HDL Ratio: 3
Triglycerides: 85 mg/dL (ref 0.0–149.0)
VLDL: 17 mg/dL (ref 0.0–40.0)

## 2023-03-29 LAB — VITAMIN D 25 HYDROXY (VIT D DEFICIENCY, FRACTURES): VITD: 27.05 ng/mL — ABNORMAL LOW (ref 30.00–100.00)

## 2023-03-29 LAB — VITAMIN B12: Vitamin B-12: 282 pg/mL (ref 211–911)

## 2023-04-05 ENCOUNTER — Encounter: Payer: Self-pay | Admitting: Family Medicine

## 2023-04-05 ENCOUNTER — Ambulatory Visit (INDEPENDENT_AMBULATORY_CARE_PROVIDER_SITE_OTHER): Payer: Medicare Other | Admitting: Family Medicine

## 2023-04-05 VITALS — BP 130/78 | HR 84 | Temp 98.2°F | Ht 60.0 in | Wt 184.0 lb

## 2023-04-05 DIAGNOSIS — E559 Vitamin D deficiency, unspecified: Secondary | ICD-10-CM | POA: Diagnosis not present

## 2023-04-05 DIAGNOSIS — R202 Paresthesia of skin: Secondary | ICD-10-CM

## 2023-04-05 DIAGNOSIS — Z0000 Encounter for general adult medical examination without abnormal findings: Secondary | ICD-10-CM

## 2023-04-05 DIAGNOSIS — E782 Mixed hyperlipidemia: Secondary | ICD-10-CM

## 2023-04-05 DIAGNOSIS — G8929 Other chronic pain: Secondary | ICD-10-CM

## 2023-04-05 DIAGNOSIS — Z7189 Other specified counseling: Secondary | ICD-10-CM

## 2023-04-05 DIAGNOSIS — I10 Essential (primary) hypertension: Secondary | ICD-10-CM

## 2023-04-05 DIAGNOSIS — F339 Major depressive disorder, recurrent, unspecified: Secondary | ICD-10-CM

## 2023-04-05 MED ORDER — VITAMIN B-12 1000 MCG PO TABS: 1000.0000 ug | ORAL_TABLET | Freq: Every day | ORAL | Status: DC

## 2023-04-05 MED ORDER — ROSUVASTATIN CALCIUM 5 MG PO TABS: ORAL_TABLET | ORAL | 1 refills | Status: DC

## 2023-04-05 NOTE — Progress Notes (Addendum)
Vitamin D and B12 are lower than prior, d/w pt at OV.  Taking 2000 units vit D per day.    Mood per outside clinic. I'll defer. She agrees.  Mood is good per patient report.   Hypertension:    Using medication without problems or lightheadedness: yes Chest pain with exertion:no Edema: mild BLE edema.   Short of breath: no Average home BMW:UXLK occ BP variation.  BP controlled today.  She isn't lightheaded now.   Statin intolerant at higher dose, we talked about trial of 5mg  crestor MWF with routine cautions d/w pt.  She agreed and wanted to try.    Tetanus 2015 Flu shot due this fall.   PNA shot up to date.   shingrix utd covid vaccine prev done.   RSV d/w pt.   Colon screening not due.  Pap not due.   DXA declined.  D/w pt.   Mammogram declined today by patient.   Advance directive d/w pt. Husband designated if patient were incapacitated.  If he is also incapacitated, then her kids are equally designated.    Joint pain improved after prev shoulder injection.  H/o carpal tunnel surgery and paresthesias improved.    She still has her license but doesn't drive.  Her husband drives.  Discussed.   Meds, vitals, and allergies reviewed.   PMH and SH reviewed  ROS: Per HPI unless specifically indicated in ROS section   GEN: nad, alert and oriented HEENT: mucous membranes moist NECK: supple w/o LA CV: rrr. PULM: ctab, no inc wob ABD: soft, +bs EXT: trace almost no BLE edema SKIN: no acute rash  30 minutes were devoted to patient care in this encounter (this includes time spent reviewing the patient's file/history, interviewing and examining the patient, counseling/reviewing plan with patient).

## 2023-04-05 NOTE — Patient Instructions (Addendum)
Try taking 4000 units vit D per day.  Take B12 daily.    Recheck labs in about 4 months.  Nonfasting lab visit.   Let me know if you can/cannot tolerate crestor 5mg  MWF.  If more aches, then stop it.   Take care.  Glad to see you.

## 2023-04-08 NOTE — Assessment & Plan Note (Signed)
Per outside clinic, mood is good.

## 2023-04-08 NOTE — Assessment & Plan Note (Signed)
Try taking 4000 units vit D per day.  Take B12 daily.    Recheck labs in about 4 months.  Nonfasting lab visit.

## 2023-04-08 NOTE — Assessment & Plan Note (Signed)
Statin intolerant at higher dose, we talked about trial of 5mg  crestor MWF with routine cautions d/w pt.  She agreed and wanted to try.   See AVS.

## 2023-04-08 NOTE — Assessment & Plan Note (Signed)
Improved, see above.

## 2023-04-08 NOTE — Assessment & Plan Note (Signed)
Tetanus 2015 Flu shot due this fall.   PNA shot up to date.   shingrix utd covid vaccine prev done.   RSV d/w pt.   Colon screening not due.  Pap not due.   DXA declined.  D/w pt.   Mammogram declined today by patient.   Advance directive d/w pt. Husband designated if patient were incapacitated.  If he is also incapacitated, then her kids are equally designated.

## 2023-04-08 NOTE — Assessment & Plan Note (Signed)
Continue metoprolol as is.

## 2023-04-08 NOTE — Assessment & Plan Note (Signed)
Advance directive d/w pt. Husband designated if patient were incapacitated.  If he is also incapacitated, then her kids are equally designated.

## 2023-04-18 ENCOUNTER — Emergency Department (HOSPITAL_COMMUNITY): Payer: Medicare Other

## 2023-04-18 ENCOUNTER — Encounter (HOSPITAL_COMMUNITY): Payer: Self-pay | Admitting: Emergency Medicine

## 2023-04-18 ENCOUNTER — Other Ambulatory Visit: Payer: Self-pay

## 2023-04-18 ENCOUNTER — Emergency Department (HOSPITAL_COMMUNITY)
Admission: EM | Admit: 2023-04-18 | Discharge: 2023-04-19 | Disposition: A | Discharge status: Home / Self Care | Payer: Medicare Other | Attending: Emergency Medicine | Admitting: Emergency Medicine

## 2023-04-18 DIAGNOSIS — Y92009 Unspecified place in unspecified non-institutional (private) residence as the place of occurrence of the external cause: Secondary | ICD-10-CM | POA: Insufficient documentation

## 2023-04-18 DIAGNOSIS — I2510 Atherosclerotic heart disease of native coronary artery without angina pectoris: Secondary | ICD-10-CM | POA: Insufficient documentation

## 2023-04-18 DIAGNOSIS — W01198A Fall on same level from slipping, tripping and stumbling with subsequent striking against other object, initial encounter: Secondary | ICD-10-CM | POA: Diagnosis not present

## 2023-04-18 DIAGNOSIS — Z79899 Other long term (current) drug therapy: Secondary | ICD-10-CM | POA: Diagnosis not present

## 2023-04-18 DIAGNOSIS — R519 Headache, unspecified: Secondary | ICD-10-CM | POA: Diagnosis not present

## 2023-04-18 DIAGNOSIS — S42031A Displaced fracture of lateral end of right clavicle, initial encounter for closed fracture: Secondary | ICD-10-CM | POA: Diagnosis not present

## 2023-04-18 DIAGNOSIS — I10 Essential (primary) hypertension: Secondary | ICD-10-CM | POA: Diagnosis not present

## 2023-04-18 DIAGNOSIS — S4991XA Unspecified injury of right shoulder and upper arm, initial encounter: Secondary | ICD-10-CM | POA: Diagnosis present

## 2023-04-18 DIAGNOSIS — Z7982 Long term (current) use of aspirin: Secondary | ICD-10-CM | POA: Diagnosis not present

## 2023-04-18 DIAGNOSIS — W19XXXA Unspecified fall, initial encounter: Secondary | ICD-10-CM

## 2023-04-18 NOTE — ED Triage Notes (Signed)
Pt BIB EMS for a mechanical fall. Pt states that she was moving too fast and fell hitting the R side of her head. Denies neck/back pain. C/o R shoulder pain. Denies LOC. States she does not take any blood thinners, takes a daily aspirin.

## 2023-04-18 NOTE — ED Provider Notes (Signed)
[name] [facility] AT [facility] Provider Note  CSN: [id number] Arrival date & time: 04/18/23 2326  Chief Complaint(s) Fall Pt BIB EMS for a mechanical fall. Pt states that she was moving too fast and fell hitting the R side of her head. Denies neck/back pain. C/o R shoulder pain. Denies LOC. States she does not take any blood thinners, takes a daily aspirin.   HPI Robin Juarez is a 89 y.o. female who presents to the emergency department after mechanical fall at home resulting in head trauma and right shoulder pain.  Patient reports she was in the bathroom brushing her teeth.  When she turned quickly to dry her face, she lost her balance stumbling through the bathroom door and landing in the hallway.  She landed on her right hip on the floor with her right shoulder and head up against the wall.  She denied any loss of consciousness.  Endorses mild right head pain.  Mild neck pain.  Moderate to severe right shoulder pain.  She reports chronic back pain but denies any new or exacerbation of chronic back pain.  She denies any hip pain or knee pain.  No abdominal pain.  No chest pain.  No shortness of breath.  No other physical complaints.  The history is provided by the patient.    Past Medical History Past Medical History:  Diagnosis Date   Arthritis    knee and hip OA- prev injection by Dr. Ethelene Hal   Blood transfusion without reported diagnosis    37 yrs ago -s/p back surgery   Chest pain    Coronary artery disease    50% mid LAD by cath January 2016   Depression 1993, 2000   Fibromyalgia    GERD (gastroesophageal reflux disease)    H/O hiatal hernia    Hyperlipidemia    Hypertension    OSA (obstructive sleep apnea)    off tx as of 2020, no sx as of 2020   Osteoporosis    Positive cardiac stress test    Stroke [facility])    Urinary incontinence    UTI (lower urinary tract infection)    Patient Active Problem List   Diagnosis Date Noted   Positive ANA  (antinuclear antibody) 10/23/2022   Edema 10/22/2022   Palpitations 09/05/2022   Aortic stenosis, mild 09/05/2022   Shoulder pain 04/05/2022   Venous stasis of both lower extremities 01/10/2022   Gait instability 01/10/2022   Chronic pain of both shoulders 12/13/2021   Bilateral shoulder region arthritis 11/15/2021   Cerebrovascular accident (HCC) 06/30/2021   Osteoarthritis 06/30/2021   Calculus of gallbladder without cholecystitis without obstruction 02/13/2021   Coronary artery disease 09/21/2020   Health care maintenance 05/22/2019   Vitamin D deficiency 05/22/2019   Chest pain 06/14/2017   Left hip pain 12/20/2016   Skin change 03/03/2016   Leg length discrepancy 11/19/2014   Dysuria 10/25/2014   Positive cardiac stress test    Essential hypertension 08/19/2014   Atypical chest pain 08/18/2014   CVA (cerebral infarction) 08/18/2014   Balance problem 07/14/2014   Advance care planning 03/02/2014   Spinal stenosis, lumbar region, with neurogenic claudication 05/01/2013   Low back pain 04/17/2013   Paresthesia 08/21/2012   Tremor 08/21/2012   OSA (obstructive sleep apnea) 08/07/2012   Medicare annual wellness visit, subsequent 01/12/2012   Thumb pain 08/25/2011   Weakness 04/27/2011   HYPERGLYCEMIA, MILD 09/12/2010   DISORDER OF BONE AND CARTILAGE UNSPECIFIED 05/23/2010   Anxiety state  03/01/2010   ESOPHAGEAL SPASM 03/01/2010   HIATAL HERNIA WITH REFLUX 03/01/2010   GENERALIZED OSTEOARTHROSIS UNSPECIFIED SITE 03/01/2010   Rheumatism and fibrositis 03/01/2010   Myalgia 03/01/2010   OSTEOPOROSIS 03/01/2010   HLD (hyperlipidemia) 02/25/2010   MDD (major depressive disorder) 02/25/2010   GERD 02/25/2010   URINARY INCONTINENCE 02/25/2010   UTI'S, HX OF 02/25/2010   Home Medication(s) Prior to Admission medications   Medication Sig Start Date End Date Taking? Authorizing Provider  oxyCODONE (ROXICODONE) 5 MG immediate release tablet Take 0.5-1 tablets (2.5-5 mg total)  by mouth every 6 (six) hours as needed for up to 5 days for severe pain. 04/19/23 04/24/23 Yes Enid Maultsby, Amadeo Garnet, MD  AMBULATORY NON FORMULARY MEDICATION DME Rolator walker with seat Dispense 1 Dx code: M54.50 Use as needed 01/18/23   Rodolph Bong, MD  aspirin EC 81 MG tablet Take 1 tablet (81 mg total) by mouth daily. Swallow whole. 01/11/22   Azalee Course, PA  Baclofen 5 MG TABS TAKE 1 TABLET BY MOUTH TWICE  DAILY AS NEEDED FOR MUSCLE  SPASMS 12/13/22   Joaquim Nam, MD  busPIRone (BUSPAR) 10 MG tablet as needed.    [provider]  Cholecalciferol (VITAMIN D) 50 MCG (2000 UT) CAPS Take 2 capsules (4,000 Units total) by mouth daily. 06/10/20   Joaquim Nam, MD  Coenzyme Q10 (CO Q 10 PO) Take 1 tablet by mouth daily.    [provider]  cyanocobalamin (VITAMIN B12) 1000 MCG tablet Take 1 tablet (1,000 mcg total) by mouth daily. 04/05/23   Joaquim Nam, MD  DULoxetine (CYMBALTA) 30 MG capsule Take 90 mg by mouth daily.     [provider]  folic acid (FOLVITE) 800 MCG tablet Take 800 mcg by mouth daily.     [provider]  furosemide (LASIX) 20 MG tablet TAKE 1 TABLET (20 MG TOTAL) BY MOUTH DAILY AS NEEDED FOR FLUID. 12/19/22   Joaquim Nam, MD  gabapentin (NEURONTIN) 300 MG capsule 1 tab noon, 1 tab evening, 2 tabs bedtime (4 tabs total) 04/03/22   Joaquim Nam, MD  KRILL OIL PO Take 100 mg by mouth daily.    [provider]  lamoTRIgine (LAMICTAL) 100 MG tablet Take 1.5 tablets (150 mg total) by mouth daily after supper. 02/19/23   Joaquim Nam, MD  metoprolol succinate (TOPROL-XL) 25 MG 24 hr tablet TAKE 1 TABLET BY MOUTH ONCE  DAILY 07/21/22   Runell Gess, MD  OLANZapine (ZYPREXA) 2.5 MG tablet Take 1 tablet (2.5 mg total) by mouth at bedtime. 08/05/15   Joaquim Nam, MD  rosuvastatin (CRESTOR) 5 MG tablet Take 1 tablet by mouth three days per week (MWF) 04/05/23   Joaquim Nam, MD  Allergies Digoxin, Nitrofurantoin, Alendronate sodium, Cephalosporins, Chlordiazepoxide hcl, Cyclobenzaprine, Lisinopril, Phenobarbital-belladonna alk, Praluent [alirocumab], Repatha [evolocumab], Ritalin [methylphenidate hcl], Ritalin [methylphenidate], Statins, Trimethoprim, Augmentin [amoxicillin-pot clavulanate], Cefaclor, Chlordiazepoxide, Clavulanic acid, Omeprazole, Oxcarbazepine, Sulfa antibiotics, Tetracycline, and Zetia [ezetimibe]  Review of Systems Review of Systems As noted in HPI  Physical Exam Vital Signs  I have reviewed the triage vital signs BP (!) 167/70 (BP Location: Left Arm)   Pulse 80   Temp 98.2 F (36.8 C) (Oral)   Resp 16   Ht 5' (1.524 m)   Wt 83.5 kg   SpO2 95%   BMI 35.94 kg/m   Physical Exam Constitutional:      General: She is not in acute distress.    Appearance: She is well-developed. She is not diaphoretic.  HENT:     Head: Normocephalic and atraumatic.     Right Ear: External ear normal.     Left Ear: External ear normal.     Nose: Nose normal.  Eyes:     General: No scleral icterus.       Right eye: No discharge.        Left eye: No discharge.     Conjunctiva/sclera: Conjunctivae normal.     Pupils: Pupils are equal, round, and reactive to light.  Cardiovascular:     Rate and Rhythm: Normal rate and regular rhythm.     Pulses:          Radial pulses are 2+ on the right side and 2+ on the left side.       Dorsalis pedis pulses are 2+ on the right side and 2+ on the left side.     Heart sounds: Normal heart sounds. No murmur heard.    No friction rub. No gallop.  Pulmonary:     Effort: Pulmonary effort is normal. No respiratory distress.     Breath sounds: Normal breath sounds. No stridor. No wheezing.  Abdominal:     General: There is no distension.     Palpations: Abdomen is soft.     Tenderness: There is no abdominal tenderness.   Musculoskeletal:     Right shoulder: Tenderness and bony tenderness present. No swelling, deformity or laceration. Decreased range of motion. Normal strength. Normal pulse.     Left shoulder: Normal strength. Normal pulse.     Cervical back: Normal range of motion and neck supple. No bony tenderness.     Thoracic back: No bony tenderness.     Lumbar back: No bony tenderness.     Right hip: No tenderness. Normal range of motion.     Left hip: No tenderness. Normal range of motion.     Comments: Clavicles stable. Chest stable to AP/Lat compression. Pelvis stable to Lat compression. No obvious extremity deformity. No chest or abdominal wall contusion.  Skin:    General: Skin is warm and dry.     Findings: No erythema or rash.  Neurological:     Mental Status: She is alert and oriented to person, place, and time.     Comments: Moving all extremities     ED Results and Treatments Labs (all labs ordered are listed, but only abnormal results are displayed) Labs Reviewed - No data to display  EKG  EKG Interpretation Date/Time:    Ventricular Rate:    PR Interval:    QRS Duration:    QT Interval:    QTC Calculation:   R Axis:      Text Interpretation:         Radiology DG Shoulder Right  Result Date: 04/19/2023 CLINICAL DATA:  Fall with right shoulder and neck pain. EXAM: RIGHT SHOULDER - 2+ VIEW; RIGHT CLAVICLE - 2+ VIEWS COMPARISON:  None Available. FINDINGS: There is a comminuted fracture of the distal right clavicle with superior subluxation of the distal fracture fragment. Degenerative changes are present at the glenohumeral and acromioclavicular joints. The soft tissues are within normal limits. IMPRESSION: 1. Comminuted fracture of the distal right clavicle with superior subluxation of the distal fracture fragment. 2. Degenerative changes at the  acromioclavicular and glenohumeral joints. Electronically Signed   By: Thornell Sartorius M.D.   On: 04/19/2023 00:21   DG Clavicle Right  Result Date: 04/19/2023 CLINICAL DATA:  Fall with right shoulder and neck pain. EXAM: RIGHT SHOULDER - 2+ VIEW; RIGHT CLAVICLE - 2+ VIEWS COMPARISON:  None Available. FINDINGS: There is a comminuted fracture of the distal right clavicle with superior subluxation of the distal fracture fragment. Degenerative changes are present at the glenohumeral and acromioclavicular joints. The soft tissues are within normal limits. IMPRESSION: 1. Comminuted fracture of the distal right clavicle with superior subluxation of the distal fracture fragment. 2. Degenerative changes at the acromioclavicular and glenohumeral joints. Electronically Signed   By: Thornell Sartorius M.D.   On: 04/19/2023 00:21   CT Head Wo Contrast  Result Date: 04/19/2023 CLINICAL DATA:  Fall EXAM: CT HEAD WITHOUT CONTRAST CT CERVICAL SPINE WITHOUT CONTRAST TECHNIQUE: Multidetector CT imaging of the head and cervical spine was performed following the standard protocol without intravenous contrast. Multiplanar CT image reconstructions of the cervical spine were also generated. RADIATION DOSE REDUCTION: This exam was performed according to the departmental dose-optimization program which includes automated exposure control, adjustment of the mA and/or kV according to patient size and/or use of iterative reconstruction technique. COMPARISON:  None Available. FINDINGS: CT HEAD FINDINGS Brain: There is no mass, hemorrhage or extra-axial collection. The size and configuration of the ventricles and extra-axial CSF spaces are normal. The brain parenchyma is normal, without evidence of acute or chronic infarction. Vascular: No abnormal hyperdensity of the major intracranial arteries or dural venous sinuses. No intracranial atherosclerosis. Skull: The visualized skull base, calvarium and extracranial soft tissues are normal.  Sinuses/Orbits: No fluid levels or advanced mucosal thickening of the visualized paranasal sinuses. No mastoid or middle ear effusion. The orbits are normal. CT CERVICAL SPINE FINDINGS Alignment: No static subluxation. Facets are aligned. Occipital condyles are normally positioned. Skull base and vertebrae: No acute fracture. Soft tissues and spinal canal: No prevertebral fluid or swelling. No visible canal hematoma. Disc levels: No advanced spinal canal or neural foraminal stenosis. Upper chest: No pneumothorax, pulmonary nodule or pleural effusion. Other: Normal visualized paraspinal cervical soft tissues. IMPRESSION: 1. No acute intracranial abnormality. 2. No acute fracture or static subluxation of the cervical spine. Electronically Signed   By: Deatra Robinson M.D.   On: 04/19/2023 00:12   CT Cervical Spine Wo Contrast  Result Date: 04/19/2023 CLINICAL DATA:  Fall EXAM: CT HEAD WITHOUT CONTRAST CT CERVICAL SPINE WITHOUT CONTRAST TECHNIQUE: Multidetector CT imaging of the head and cervical spine was performed following the standard protocol without intravenous contrast. Multiplanar CT image reconstructions of the cervical spine were also generated.  RADIATION DOSE REDUCTION: This exam was performed according to the departmental dose-optimization program which includes automated exposure control, adjustment of the mA and/or kV according to patient size and/or use of iterative reconstruction technique. COMPARISON:  None Available. FINDINGS: CT HEAD FINDINGS Brain: There is no mass, hemorrhage or extra-axial collection. The size and configuration of the ventricles and extra-axial CSF spaces are normal. The brain parenchyma is normal, without evidence of acute or chronic infarction. Vascular: No abnormal hyperdensity of the major intracranial arteries or dural venous sinuses. No intracranial atherosclerosis. Skull: The visualized skull base, calvarium and extracranial soft tissues are normal. Sinuses/Orbits: No  fluid levels or advanced mucosal thickening of the visualized paranasal sinuses. No mastoid or middle ear effusion. The orbits are normal. CT CERVICAL SPINE FINDINGS Alignment: No static subluxation. Facets are aligned. Occipital condyles are normally positioned. Skull base and vertebrae: No acute fracture. Soft tissues and spinal canal: No prevertebral fluid or swelling. No visible canal hematoma. Disc levels: No advanced spinal canal or neural foraminal stenosis. Upper chest: No pneumothorax, pulmonary nodule or pleural effusion. Other: Normal visualized paraspinal cervical soft tissues. IMPRESSION: 1. No acute intracranial abnormality. 2. No acute fracture or static subluxation of the cervical spine. Electronically Signed   By: Deatra Robinson M.D.   On: 04/19/2023 00:12    Medications Ordered in ED Medications  acetaminophen (TYLENOL) tablet 1,000 mg (1,000 mg Oral Given 04/19/23 0032)   Procedures Procedures  (including critical care time) Medical Decision Making / ED Course   Medical Decision Making Amount and/or Complexity of Data Reviewed Radiology: ordered.  Risk OTC drugs. Prescription drug management.    Mechanical fall resulting in head trauma and right shoulder pain. No anticoagulation. ABCs intact Secondary as above CT of the head given head trauma and age to rule out ICH.  Given mechanism, will obtain CT of the cervical spine to rule out fracture.  Plain film of the right shoulder given likely injury.  Patient declined any pain medicine at this time.  Clinical Course as of 04/19/23 0108  Thu Apr 19, 2023  0020 CT head and cervical spine negative for any acute injuries.  Plain film of the right shoulder notable for distal clavicle fracture.  Dedicated clavicle x-ray confirmed. No skin tenting. No other injuries noted on imaging.  Sling and Tylenol provided [PC]    Clinical Course User Index [PC] Lynnetta Tom, Amadeo Garnet, MD    Final Clinical Impression(s) / ED  Diagnoses Final diagnoses:  Fall in home, initial encounter  Closed displaced fracture of acromial end of right clavicle, initial encounter   The patient appears reasonably screened and/or stabilized for discharge and I doubt any other medical condition or other [facility] requiring further screening, evaluation, or treatment in the ED at this time. I have discussed the findings, Dx and Tx plan with the patient/family who expressed understanding and agree(s) with the plan. Discharge instructions discussed at length. The patient/family was given strict return precautions who verbalized understanding of the instructions. No further questions at time of discharge.  Disposition: Discharge  Condition: Good  ED Discharge Orders          Ordered    oxyCODONE (ROXICODONE) 5 MG immediate release tablet  Every 6 hours PRN        04/19/23 0108            [facility] narcotic database unable to be reviewed due to access issues. Patient had Rx for Tramadol previously. Out of treatment window.   Follow Up:  Madelyn Brunner, DO [address] [place] [address] [phone]  Call  to schedule an appointment for close follow up  Joaquim Nam, MD [address] [place] [address] [phone]  Call  as needed     This chart was dictated using voice recognition software.  Despite best efforts to proofread,  errors can occur which can change the documentation meaning.    Nira Conn, MD 04/19/23 0110

## 2023-04-19 ENCOUNTER — Emergency Department (HOSPITAL_COMMUNITY): Payer: Medicare Other

## 2023-04-19 MED ORDER — ACETAMINOPHEN 500 MG PO TABS
1000.0000 mg | ORAL_TABLET | Freq: Once | ORAL | Status: AC
  Administered 2023-04-19: 1000 mg via ORAL
  Filled 2023-04-19: qty 2

## 2023-04-19 MED ORDER — OXYCODONE HCL 5 MG PO TABS: 2.5000 mg | ORAL_TABLET | Freq: Four times a day (QID) | ORAL | 0 refills | Status: AC | PRN

## 2023-04-19 NOTE — Discharge Instructions (Signed)
For pain control you may take 1000 mg of Tylenol every 8 hours scheduled.  In addition you can take 0.5 to 1 tablet of Oxycodone every 6 hours as needed for pain not controlled with the scheduled Tylenol.  

## 2023-04-19 NOTE — ED Notes (Signed)
Patient verbalizes understanding of discharge instructions. Opportunity for questioning and answers were provided. Armband removed by staff, pt discharged from ED. Pt taken to ED waiting room via wheel chair.  

## 2023-04-20 ENCOUNTER — Telehealth: Payer: Self-pay | Admitting: Family Medicine

## 2023-04-20 NOTE — Telephone Encounter (Signed)
Spoke with patients husband and advised that seeing Dr. Shon Baton was a good option. Appt to see him is set for 04/26/23.

## 2023-04-20 NOTE — Telephone Encounter (Signed)
Patient broke her collar bone during a fall,they were advised to see Madelyn Brunner for the surgery on her collar bone.Husband called in and would like to know if Dr Para March have any suggestions of any other surgeons,or for her to just got with Annabelle Harman?

## 2023-04-20 NOTE — Telephone Encounter (Signed)
I think it is reasonable to see Dr. Shon Baton.  He is a sports medicine doc who should be able to offer good guidance.  Thanks.

## 2023-04-25 ENCOUNTER — Telehealth: Payer: Self-pay | Admitting: *Deleted

## 2023-04-25 ENCOUNTER — Ambulatory Visit (INDEPENDENT_AMBULATORY_CARE_PROVIDER_SITE_OTHER): Payer: Medicare Other

## 2023-04-25 ENCOUNTER — Encounter: Payer: Self-pay | Admitting: Family Medicine

## 2023-04-25 ENCOUNTER — Telehealth: Payer: Self-pay | Admitting: Family Medicine

## 2023-04-25 ENCOUNTER — Ambulatory Visit: Payer: Medicare Other | Admitting: Family Medicine

## 2023-04-25 VITALS — BP 140/86 | HR 73 | Ht 60.0 in

## 2023-04-25 DIAGNOSIS — G8929 Other chronic pain: Secondary | ICD-10-CM

## 2023-04-25 DIAGNOSIS — S42009D Fracture of unspecified part of unspecified clavicle, subsequent encounter for fracture with routine healing: Secondary | ICD-10-CM

## 2023-04-25 DIAGNOSIS — M25551 Pain in right hip: Secondary | ICD-10-CM | POA: Diagnosis not present

## 2023-04-25 DIAGNOSIS — M545 Low back pain, unspecified: Secondary | ICD-10-CM | POA: Diagnosis not present

## 2023-04-25 DIAGNOSIS — S42031A Displaced fracture of lateral end of right clavicle, initial encounter for closed fracture: Secondary | ICD-10-CM | POA: Diagnosis not present

## 2023-04-25 DIAGNOSIS — R262 Difficulty in walking, not elsewhere classified: Secondary | ICD-10-CM

## 2023-04-25 MED ORDER — VITAMIN D3 125 MCG (5000 UT) PO CAPS: 5000.0000 [IU] | ORAL_CAPSULE | Freq: Every day | ORAL | Status: AC

## 2023-04-25 NOTE — Telephone Encounter (Signed)
I spoke to your primary care provider and he is going to place an urgent referral for social work help.  We do not really have time to wait for home health to come out and place a referral.  Our office should contact [facility] and talk to the coordinator there and see what we can do faster.  I do not have time to wait 2 weeks for home health to get out there and place a referral.  This needs to be done urgently.

## 2023-04-25 NOTE — Addendum Note (Signed)
Addended by: Joaquim Nam on: 04/25/2023 09:23 PM   Modules accepted: Orders

## 2023-04-25 NOTE — Progress Notes (Unsigned)
  Care Coordination  Outreach Note  04/25/2023 Name: Robin Juarez MRN: [id number] DOB: [birth year]   Care Coordination Outreach Attempts: unsuccessful outreach to schedule in response to a referral.  Follow Up Plan:  Additional outreach attempts will be made  Encounter Outcome:  No Answer  Burman Nieves, [facility] Care Coordination Care Guide Direct Dial: [phone]

## 2023-04-25 NOTE — Telephone Encounter (Signed)
Call from Dr. [name].  He is going to work on the [facility] for the patient.  She needs rehab placement.  I put in an urgent social work referral.  Please check with the social work department to make sure this order will suffice, so they can contact the patient's family.  Thanks.   I thank all involved.

## 2023-04-25 NOTE — Telephone Encounter (Signed)
Patient called in stating that at her cpe she was told to increase her vitamin D to 5000 units a day,she called in stating that that is what she was already taking so she's confused to as he would like for her to do.Please advise.

## 2023-04-25 NOTE — Telephone Encounter (Signed)
Called and spoke with patient about her vitamin D dosing. She stated that she thought she was taking 2000 units daily but she was really already taking 4000 units which Dr. Para March asked her to increase to when she was here. She wants to know if she needs to be taking even more since her levels were low and she was already on 4000 units?

## 2023-04-25 NOTE — Patient Instructions (Addendum)
Thank you for coming in today.   I think you getting into a nursing home for a little while.   Let me know which one you want to use.   Recheck with me in 2 weeks.   Cancel your appointment with Dr Shon Baton tomorrow.

## 2023-04-25 NOTE — Progress Notes (Signed)
I, Stevenson Clinch, CMA acting as a scribe for [name], MD.  Robin Juarez is a 89 y.o. female who presents to [facility] at [facility] today for hip pain. Pt was previously seen by Dr. [name] on 02/21/23 for bilat shoulder pain.  Today, pt c/o hip pain after suffering a fall on Aug 28th. She was transported by EMS of the [facility] ED after the fall. Pt reports she was in the bathroom brushing her teeth, when she turned quickly to dry her face, lost her balance, stumbling through the bathroom door and landing in the hallway, landing on her R side. Pt locates pain to right side of the body including the clavicle, shoulder, hip, and leg. Notes n/t in the right leg at night. Pain worse with morning ambulation but improves throughout the day with movement. Had Hydration Therapy with Ketorolac yesterday. Prescribed Oxycodone but d/c, unable to tolerate. Taking Tylenol 500 mg daily. Concerned for hip fracture. Ambulating in a wheelchair today.   She is having significant trouble with ambulation.  She was seen in July for among other things lumbar radiculopathy and a little bit of leg weakness.  She had considerable improvement with an epidural steroid injection a few months ago and is still having benefit.  However she has been using a walker to ambulate for the most part and can no longer use a walker due to her right clavicle fracture.   Pertinent review of systems: No fevers or chills  Relevant historical information: History of stroke.  Shoulder arthritis.  Lumbar radiculopathy.  Hypertension.  Balance and gait disorder.   Exam:  BP (!) 140/86   Pulse 73   Ht 5' (1.524 m)   SpO2 94%   BMI 35.94 kg/m  General: Well Developed, well nourished, and in no acute distress.   MSK: Right shoulder significant bruising present at superior and anterior shoulder.  No skin tenting is present.  Very decreased shoulder motion.  Strength not tested.  Tender palpation anterior  superior shoulder.  Right hip tender palpation lateral hip.  Decreased hip motion.  Lower extremity strength is generally intact bilaterally with no isolated weakness.  Reflexes are intact.  Gait not tested. Patient seated in a wheelchair.   Lab and Radiology Results  EXAM: RIGHT SHOULDER - 2+ VIEW; RIGHT CLAVICLE - 2+ VIEWS   COMPARISON:  None Available.   FINDINGS: There is a comminuted fracture of the distal right clavicle with superior subluxation of the distal fracture fragment. Degenerative changes are present at the glenohumeral and acromioclavicular joints. The soft tissues are within normal limits.   IMPRESSION: 1. Comminuted fracture of the distal right clavicle with superior subluxation of the distal fracture fragment. 2. Degenerative changes at the acromioclavicular and glenohumeral joints.     Electronically Signed   By: Thornell Sartorius M.D.   On: 04/19/2023 00:21 I, [name], personally (independently) visualized and performed the interpretation of the images attached in this note.  X-ray images lumbar spine and right hip obtained today personally and independently interpreted  Right hip: Mild degenerative changes.  No acute fractures are visible.  Lumbar spine: Anterolisthesis L5-S1 appears stable from MRI from July 2024.  No acute fractures are visible.  Await formal radiology review    Assessment and Plan: 89 y.o. female with fall resulting in a fracture to the right distal clavicle and lateral hip contusion.  The biggest concern here is her inability to take care of herself at home.  She  does have some help from her husband who is in his 33s and from her daughter who lives out of town.  However they are getting exhausted and running out of the ability to help her.  I think she is going to need to be in a skilled nursing facility temporarily for acute rehab.  Primary reasons are challenging ambulation need for physical therapy and urinary incontinence  (chronic issue not new). I have advised her daughter to start searching out skilled nursing facility for rehab locations in the area and I am happy to help facilitate it with forms or referrals etc.  I would like to see her back in about 2 weeks.  Would like to repeat x-ray that shoulder and to follow along next steps.  The distal clavicle fracture is a bit displaced but is its extra-articular is unlikely to be surgical.  She has so many other more pressing healthcare priorities that I do not think a surgeon would operate.  Reassess in 2 weeks as above.   PDMP not reviewed this encounter. Orders Placed This Encounter  Procedures   DG HIP UNILAT W OR W/O PELVIS 2-3 VIEWS RIGHT    Standing Status:   Future    Number of Occurrences:   1    Standing Expiration Date:   05/25/2023    Order Specific Question:   Reason for Exam (SYMPTOM  OR DIAGNOSIS REQUIRED)    Answer:   right hip pain    Order Specific Question:   Preferred imaging location?    Answer:   [name]   DG Lumbar Spine 2-3 Views    Standing Status:   Future    Number of Occurrences:   1    Standing Expiration Date:   05/25/2023    Order Specific Question:   Reason for Exam (SYMPTOM  OR DIAGNOSIS REQUIRED)    Answer:   low back pain    Order Specific Question:   Preferred imaging location?    Answer:   [name]   No orders of the defined types were placed in this encounter.    Discussed warning signs or symptoms. Please see discharge instructions. Patient expresses understanding.   The above documentation has been reviewed and is accurate and complete [name], M.D.

## 2023-04-25 NOTE — Telephone Encounter (Signed)
Talked with lindsay and we see that [facility] has reached out to start the process of the [facility] for the social work help. I will look again to see if there is any progress on this tomorrow and will reach out to the [facility] nurse that started this process and see what we can do to help further the process along faster

## 2023-04-25 NOTE — Telephone Encounter (Signed)
Given all of that, I would change to vitamin D 5000 units/day.  I hope she is feeling better in the near future.  Thanks.

## 2023-04-25 NOTE — Telephone Encounter (Signed)
Pt daughter called [facility] and per their instructions she must have a Home Health evaluation and then Home Health can refer to SNF.  Pt used [facility] in the near past, can we enter referral for Home Health Eval so this process can get started.

## 2023-04-26 ENCOUNTER — Encounter: Payer: Self-pay | Admitting: *Deleted

## 2023-04-26 ENCOUNTER — Ambulatory Visit: Payer: Medicare Other | Admitting: Sports Medicine

## 2023-04-26 ENCOUNTER — Telehealth: Payer: Self-pay | Admitting: *Deleted

## 2023-04-26 NOTE — Telephone Encounter (Signed)
Called patient she was given information. While on the phone she wanted to make sure you were aware of fall on 8/28. She is being followed by sports med after evaluation in ED.

## 2023-04-26 NOTE — Patient Outreach (Signed)
  Care Coordination   04/26/2023 Name: SHERRIDAN TROXELL MRN: [id number] DOB: 1930/10/02   Care Coordination Outreach Attempts:  An unsuccessful telephone outreach was attempted today to offer the patient information about available care coordination services. VM left for patient's daughter to return this CSW's call.  Follow Up Plan:  Additional outreach attempts will be made to offer the patient care coordination information and services.   Encounter Outcome:  No Answer   Care Coordination Interventions:  No, not indicated     Haakon Titsworth, LCSW [place]  [facility], [facility] Health Licensed Clinical Social Worker Care Coordinator  Direct Dial: [phone]

## 2023-04-26 NOTE — Progress Notes (Signed)
  Care Coordination   Note   04/26/2023 Name: Robin Juarez MRN: [id number] DOB: 1930/12/12  Robin Juarez is a 89 y.o. year old female who sees Joaquim Nam, MD for primary care. I reached out to [name] by phone today in response to a referral  Follow up plan:  Telephone appointment with care coordination team member scheduled for:  04/30/2023  Encounter Outcome:  Patient Scheduled  Burman Nieves, [facility] Care Coordination Care Guide Direct Dial: [phone]

## 2023-04-26 NOTE — Telephone Encounter (Signed)
Forwarding to Dr. Corey to review.  

## 2023-04-26 NOTE — Patient Outreach (Signed)
  Care Coordination   Collaboration   Visit Note   04/26/2023 Name: Robin Juarez MRN: [id number] DOB: [birth year]  Robin Juarez is a 89 y.o. year old female who sees Joaquim Nam, MD for primary care. I  collaborated with the post acute care coordinator regarding eligibility for the SNF waiver.  What matters to the patients health and wellness today?  Placement options in short-term rehab    Goals Addressed             This Visit's Progress    care coordination activities       Interventions Today    Flowsheet Row Most Recent Value  General Interventions   General Interventions Discussed/Reviewed Communication with  Communication with RN  [post-acute care coordinator to discuss possible eligiblity for the SNF waiver program-patient's family requesting placement in short term rehab-RN to follow up]              SDOH assessments and interventions completed:  No     Care Coordination Interventions:  Yes, provided   Follow up plan: Follow up call scheduled for 04/30/23    Encounter Outcome:  Patient Visit Completed

## 2023-04-26 NOTE — Telephone Encounter (Signed)
Patient's daughter called stating that her and her husband are concerned about Robin Juarez going into a facility at this time due to Covid and other illnesses. She said that they would like to try to help as much as they can at home for two weeks to see if there is any improvement.  The daughter said that they had a very good evening and both Reeda and her husband got 9 hours of sleep and are feeling much better this morning and trying to stay on their routine. She also asked if there was a resource that could offer assistance at home for leg strengthening exercises.

## 2023-04-27 ENCOUNTER — Telehealth: Payer: Self-pay | Admitting: Family Medicine

## 2023-04-27 NOTE — Telephone Encounter (Signed)
Patient daughter Robin Juarez called in returning a call she received. She tried to call the number back that was left on her voicemail but its not going through. Thank you!

## 2023-04-27 NOTE — Telephone Encounter (Signed)
I ordered home health physical therapy.

## 2023-04-27 NOTE — Progress Notes (Signed)
Low back x-ray shows severe arthritis changes.

## 2023-04-27 NOTE — Progress Notes (Signed)
Right hip x-ray shows some arthritis of both hips.

## 2023-04-27 NOTE — Addendum Note (Signed)
Addended by: Rodolph Bong on: 04/27/2023 07:35 AM   Modules accepted: Orders

## 2023-04-27 NOTE — Telephone Encounter (Signed)
Yes, I saw the ER report and talked to Dr. [name]. I hope she feels better soon.

## 2023-04-30 ENCOUNTER — Encounter: Payer: Self-pay | Admitting: *Deleted

## 2023-04-30 ENCOUNTER — Telehealth: Payer: Self-pay | Admitting: *Deleted

## 2023-04-30 NOTE — Patient Outreach (Signed)
  Care Coordination   04/30/2023 Name: EMELLY GOYAL MRN: [id number] DOB: Jun 06, 1931   Care Coordination Outreach Attempts:  An unsuccessful telephone outreach was attempted for a scheduled appointment today.  Follow Up Plan:  Additional outreach attempts will be made to offer the patient care coordination information and services.   Encounter Outcome:  No Answer   Care Coordination Interventions:  No, not indicated    Yaneli Keithley, LCSW [place]  [facility], [facility] Health Licensed Clinical Social Worker Care Coordinator  Direct Dial: [phone]

## 2023-05-01 ENCOUNTER — Telehealth: Payer: Self-pay | Admitting: Family Medicine

## 2023-05-01 DIAGNOSIS — R32 Unspecified urinary incontinence: Secondary | ICD-10-CM

## 2023-05-01 NOTE — Telephone Encounter (Signed)
Patient daughter Robin Juarez called in and stated that she talked to her moms insurance company and they informed her that she can get a purewick machine. She stated that she is needing the machine due to falls and incontinence. She stated that Medicare will cover it, they just need a prescription to be sent over to [id number] - [place], [place] - [address]. Thank you!

## 2023-05-02 NOTE — Telephone Encounter (Signed)
Please send order for PureWick device, use daily as needed, diagnosis R32.

## 2023-05-03 NOTE — Telephone Encounter (Signed)
Printed rx. Placed in Dr Lianne Bushy box to sign.

## 2023-05-03 NOTE — Addendum Note (Signed)
Addended by: Nanci Pina on: 05/03/2023 04:03 PM   Modules accepted: Orders

## 2023-05-04 NOTE — Telephone Encounter (Signed)
Rx resent to [facility].

## 2023-05-04 NOTE — Telephone Encounter (Signed)
Rx faxed to [facility]

## 2023-05-04 NOTE — Telephone Encounter (Signed)
I signed the order  Thanks!

## 2023-05-04 NOTE — Telephone Encounter (Signed)
Pt's daughter, Marylene Land, asked if rx could be sent to [facility]? Marylene Land states [facility] due to not having it in stock & not carrying the the machine. Call back # [phone].

## 2023-05-09 ENCOUNTER — Ambulatory Visit (INDEPENDENT_AMBULATORY_CARE_PROVIDER_SITE_OTHER): Payer: Medicare Other

## 2023-05-09 ENCOUNTER — Ambulatory Visit: Payer: Medicare Other | Admitting: Family Medicine

## 2023-05-09 VITALS — BP 156/82 | HR 76 | Ht 60.0 in

## 2023-05-09 DIAGNOSIS — S42031A Displaced fracture of lateral end of right clavicle, initial encounter for closed fracture: Secondary | ICD-10-CM

## 2023-05-09 DIAGNOSIS — R262 Difficulty in walking, not elsewhere classified: Secondary | ICD-10-CM

## 2023-05-09 DIAGNOSIS — S42031D Displaced fracture of lateral end of right clavicle, subsequent encounter for fracture with routine healing: Secondary | ICD-10-CM

## 2023-05-09 NOTE — Patient Instructions (Signed)
Thank you for coming in today.   Try to stay active.   OK to stop using the sling most of the time.   OK to work on shoulder range of motion with PT.   Goal for the next month is to be able to use a walker.

## 2023-05-09 NOTE — Progress Notes (Signed)
   Rubin Payor, PhD, LAT, ATC acting as a scribe for [name], MD.  Robin Juarez is a 89 y.o. female who presents to [facility] at [facility] today for 2-wk f/u R distal clavicle fx and R hip contusion. Pt was last seen by Dr. [name] on 04/25/23 and was advised to live temporarily in a skilled nursing facility. Pt's daughter called on the 5th concerned about pt going into a facility and home health PT was ordered.   Today, pt reports she feels little bit better but continues to have pain. She is only having pain in her R shoulder when she tried to move it. She woke up last night in severe pain. Hip pain is somewhat improved. She has not been taking the tramadol due to its interaction w/ gabapentin. She notes feeling low about the state of things and not being able to take care of herself. Home Health has been out for 2 visits (they have been taking over bathing the pt).   Dx imaging: 04/25/23 L-spine & R hip XR  04/19/23 R clavicle & R shoulder XR  Pertinent review of systems: No fevers or chills  Relevant historical information: History of a stroke.  Sleep apnea.  Osteoporosis.  Using a walker to ambulate   Exam:  BP (!) 156/82   Pulse 76   Ht 5' (1.524 m)   SpO2 92%   BMI 35.94 kg/m  General: Well Developed, well nourished, and in no acute distress.   MSK: Right shoulder bruising present in the superior shoulder and into the chest wall.  No skin tenting is present.  Mildly tender palpation superior shoulder near the [facility] joint. Shoulder range of motion out of the sling.  Patient has external rotation to the neutral position.  Limited abduction and limited functional internal rotation.    Lab and Radiology Results  X-ray images right clavicle obtained today personally and independently interpreted. Displaced distal clavicle fracture is present.  No change in angulation or displacement.     Assessment and Plan: 89 y.o. female with right distal clavicle  fracture in the setting of limited mobility using a walker to ambulate.  She has done well over the last 20 days at home with home health aides.  I was fearful at the last visit that she would require skilled nursing facility but she has been able to work it out on her own with lots of family support thankfully.  Today her pain is better controlled in the right shoulder region than it was last time and we can start discontinuing the sling is much as possible.  Okay to work on shoulder mobility with physical therapy.  Recheck in 3 weeks.   PDMP not reviewed this encounter. Orders Placed This Encounter  Procedures   DG Clavicle Right    Standing Status:   Future    Number of Occurrences:   1    Standing Expiration Date:   05/08/2024    Order Specific Question:   Reason for Exam (SYMPTOM  OR DIAGNOSIS REQUIRED)    Answer:   right clavicle fracture    Order Specific Question:   Preferred imaging location?    Answer:   [name]   No orders of the defined types were placed in this encounter.    Discussed warning signs or symptoms. Please see discharge instructions. Patient expresses understanding.   The above documentation has been reviewed and is accurate and complete [name], M.D.

## 2023-05-14 ENCOUNTER — Telehealth: Payer: Self-pay | Admitting: Family Medicine

## 2023-05-14 DIAGNOSIS — R300 Dysuria: Secondary | ICD-10-CM

## 2023-05-14 MED ORDER — CIPROFLOXACIN HCL 250 MG PO TABS: 250.0000 mg | ORAL_TABLET | Freq: Two times a day (BID) | ORAL | 0 refills | Status: AC

## 2023-05-14 NOTE — Telephone Encounter (Signed)
Patient's husband contacted the office to request an appointment for a possible UTI. Says patient is experiencing burning with urination. Patient's husband says that Dr. Para March knows her well and that she recently took a fall and is in a lot of pain. It will be hard for her to get to the office to be seen, husband asked if he could speak with someone about calling in a medication for her without being seen.  Advised husband that the best thing to do would be to make an appointment to have patient evaluated and to get some medication if needed. Patient's husband did make her an appt for tomorrow at 9:30, but asked if I would send a message and have someone call them. Please advise home number

## 2023-05-14 NOTE — Telephone Encounter (Signed)
Called pt.  She had burning with urination similar to prev UTI.  Urinary frequency.  D/w pt about options.  She is going to have sig difficulty getting to OV tomorrow.   She can collect a urine sample in a container at home.  She can sterilize the container ahead of time.  Family can drop off the urine sample tomorrow.    I put in the order for Ucx.    Please cancel the OV for Tuesday- patient agreed with cancelling the appointment.    Start cipro after collecting urine sample at home.  Rx sent.  Routine cautions given to patient. She agrees with plan.

## 2023-05-15 ENCOUNTER — Other Ambulatory Visit: Payer: Self-pay

## 2023-05-15 ENCOUNTER — Ambulatory Visit: Payer: Medicare Other | Admitting: Family Medicine

## 2023-05-15 DIAGNOSIS — R300 Dysuria: Secondary | ICD-10-CM

## 2023-05-16 LAB — URINE CULTURE
MICRO NUMBER:: 15508794
SPECIMEN QUALITY:: ADEQUATE

## 2023-05-16 NOTE — Telephone Encounter (Addendum)
Tried to call patient but number just rings and rings and could not leave a message.

## 2023-05-16 NOTE — Telephone Encounter (Signed)
Called patient back and spoke with patient. Patient states she started the abx yesterday and is still having a terrible time. She feels no better and still in a lot of pain. States abx was a small dose and wanted to know if longer dose of abx needs to be sent in?

## 2023-05-16 NOTE — Telephone Encounter (Signed)
Tried to call patient back but phone just rings and rings again.

## 2023-05-16 NOTE — Telephone Encounter (Signed)
Sending note to [employer].

## 2023-05-16 NOTE — Telephone Encounter (Signed)
That is the typical dose for UTI and if she is doing worse/not getting better I think she needs to get checked.  Unfortunately I am not in clinic today.  Her urine culture still pending.

## 2023-05-17 ENCOUNTER — Telehealth: Payer: Self-pay | Admitting: Family Medicine

## 2023-05-17 NOTE — Telephone Encounter (Signed)
Pt called to let Robin Juarez know that she feels as if she needs more of ciprofloxacin, due to her not being fully over her uti. Can another rx be sent in? Call back # [phone]

## 2023-05-17 NOTE — Telephone Encounter (Signed)
Patient husband called in and stated that Robin Juarez is on her last pill and still experiencing symptoms.

## 2023-05-18 ENCOUNTER — Ambulatory Visit: Payer: Medicare Other | Admitting: Family Medicine

## 2023-05-18 VITALS — BP 136/80 | HR 70 | Temp 98.0°F | Ht 60.0 in | Wt 184.4 lb

## 2023-05-18 DIAGNOSIS — R300 Dysuria: Secondary | ICD-10-CM | POA: Diagnosis not present

## 2023-05-18 LAB — POCT URINALYSIS DIPSTICK
Bilirubin, UA: NEGATIVE
Blood, UA: NEGATIVE
Glucose, UA: NEGATIVE
Ketones, UA: NEGATIVE
Leukocytes, UA: NEGATIVE
Nitrite, UA: NEGATIVE
Protein, UA: NEGATIVE
Urobilinogen, UA: 0.2 U/dL
pH, UA: 7 (ref 5.0–8.0)

## 2023-05-18 NOTE — Progress Notes (Signed)
Patient ID: Robin Juarez, female    DOB: January 25, 1931, 89 y.o.   MRN: [id number]  This visit was conducted in person.  BP 136/80 (BP Location: Left Arm, Patient Position: Sitting, Cuff Size: Large)   Pulse 70   Temp 98 F (36.7 C) (Oral)   Ht 5' (1.524 m)   Wt 184 lb 6.4 oz (83.6 kg)   SpO2 94%   BMI 36.01 kg/m    CC:  Chief Complaint  Patient presents with   Dysuria    Pt c/o burning and stinging when urinates. She reports sx started on Monday. Was given cipro by Dr. Para March. Completed yesterday.     Subjective:   HPI: Robin Juarez is a 89 y.o. female presenting on 05/18/2023 for Dysuria (Pt c/o burning and stinging when urinates. She reports sx started on Monday. Was given cipro by Dr. Para March. Completed yesterday. )   She has been dealing with clavicle fracture.  She reports  new onset symptoms 5 days ago.. dysuria,  some urgency , frequency.  Some low back apin.  No blood in urine.  NO fever.  No flank pain.  No abdominal pain.     No OV but urine culture.  Urine culture grew Klebsiella.. treated  Cipro  250 mg BID x 3 days.   She uses Pure Wick at night for  incontinence.. no recent catheterization. She reports  dysuria improd but still  some bladder ache.  No fever.   She has had 2-3 UTI in last year..   Relevant past medical, surgical, family and social history reviewed and updated as indicated. Interim medical history since our last visit reviewed. Allergies and medications reviewed and updated. Outpatient Medications Prior to Visit  Medication Sig Dispense Refill   AMBULATORY NON FORMULARY MEDICATION DME Rolator walker with seat Dispense 1 Dx code: M54.50 Use as needed 1 Device 0   aspirin EC 81 MG tablet Take 1 tablet (81 mg total) by mouth daily. Swallow whole. 30 tablet 12   Baclofen 5 MG TABS TAKE 1 TABLET BY MOUTH TWICE  DAILY AS NEEDED FOR MUSCLE  SPASMS 90 tablet 0   busPIRone (BUSPAR) 10 MG tablet as needed.     Cholecalciferol (VITAMIN  D3) 125 MCG (5000 UT) CAPS Take 1 capsule (5,000 Units total) by mouth daily.     Coenzyme Q10 (CO Q 10 PO) Take 1 tablet by mouth daily.     cyanocobalamin (VITAMIN B12) 1000 MCG tablet Take 1 tablet (1,000 mcg total) by mouth daily.     DULoxetine (CYMBALTA) 30 MG capsule Take 90 mg by mouth daily.      folic acid (FOLVITE) 800 MCG tablet Take 800 mcg by mouth daily.      furosemide (LASIX) 20 MG tablet TAKE 1 TABLET (20 MG TOTAL) BY MOUTH DAILY AS NEEDED FOR FLUID. 20 tablet 1   gabapentin (NEURONTIN) 300 MG capsule 1 tab noon, 1 tab evening, 2 tabs bedtime (4 tabs total)     KRILL OIL PO Take 100 mg by mouth daily.     lamoTRIgine (LAMICTAL) 100 MG tablet Take 1.5 tablets (150 mg total) by mouth daily after supper.     metoprolol succinate (TOPROL-XL) 25 MG 24 hr tablet TAKE 1 TABLET BY MOUTH ONCE  DAILY 90 tablet 3   OLANZapine (ZYPREXA) 2.5 MG tablet Take 1 tablet (2.5 mg total) by mouth at bedtime.     rosuvastatin (CRESTOR) 5 MG tablet Take 1 tablet by mouth three  days per week (MWF) 45 tablet 1   No facility-administered medications prior to visit.     Per HPI unless specifically indicated in ROS section below Review of Systems  Constitutional:  Negative for fatigue and fever.  HENT:  Negative for congestion.   Eyes:  Negative for pain.  Respiratory:  Negative for cough and shortness of breath.   Cardiovascular:  Negative for chest pain, palpitations and leg swelling.  Gastrointestinal:  Negative for abdominal pain.  Genitourinary:  Negative for dysuria and vaginal bleeding.  Musculoskeletal:  Negative for back pain.  Neurological:  Negative for syncope, light-headedness and headaches.  Psychiatric/Behavioral:  Negative for dysphoric mood.    Objective:  BP 136/80 (BP Location: Left Arm, Patient Position: Sitting, Cuff Size: Large)   Pulse 70   Temp 98 F (36.7 C) (Oral)   Ht 5' (1.524 m)   Wt 184 lb 6.4 oz (83.6 kg)   SpO2 94%   BMI 36.01 kg/m   Wt Readings from Last  3 Encounters:  05/18/23 184 lb 6.4 oz (83.6 kg)  04/18/23 184 lb (83.5 kg)  04/05/23 184 lb (83.5 kg)      Physical Exam Constitutional:      General: She is not in acute distress.    Appearance: Normal appearance. She is well-developed. She is not ill-appearing or toxic-appearing.  HENT:     Head: Normocephalic.     Right Ear: Hearing, tympanic membrane, ear canal and external ear normal. Tympanic membrane is not erythematous, retracted or bulging.     Left Ear: Hearing, tympanic membrane, ear canal and external ear normal. Tympanic membrane is not erythematous, retracted or bulging.     Nose: No mucosal edema or rhinorrhea.     Right Sinus: No maxillary sinus tenderness or frontal sinus tenderness.     Left Sinus: No maxillary sinus tenderness or frontal sinus tenderness.     Mouth/Throat:     Mouth: Oropharynx is clear and moist and mucous membranes are normal.     Pharynx: Uvula midline.  Eyes:     General: Lids are normal. Lids are everted, no foreign bodies appreciated.     Extraocular Movements: EOM normal.     Conjunctiva/sclera: Conjunctivae normal.     Pupils: Pupils are equal, round, and reactive to light.  Neck:     Thyroid: No thyroid mass or thyromegaly.     Vascular: No carotid bruit.     Trachea: Trachea normal.  Cardiovascular:     Rate and Rhythm: Normal rate and regular rhythm.     Pulses: Normal pulses.     Heart sounds: Normal heart sounds, S1 normal and S2 normal. No murmur heard.    No friction rub. No gallop.  Pulmonary:     Effort: Pulmonary effort is normal. No tachypnea or respiratory distress.     Breath sounds: Normal breath sounds. No decreased breath sounds, wheezing, rhonchi or rales.  Abdominal:     General: Bowel sounds are normal.     Palpations: Abdomen is soft.     Tenderness: There is no abdominal tenderness.  Musculoskeletal:     Cervical back: Normal range of motion and neck supple.     Comments: Right arm in sling  Skin:     General: Skin is warm, dry and intact.     Findings: No rash.  Neurological:     Mental Status: She is alert.  Psychiatric:        Mood and Affect: Mood is not anxious  or depressed.        Speech: Speech normal.        Behavior: Behavior normal. Behavior is cooperative.        Thought Content: Thought content normal.        Cognition and Memory: Cognition and memory normal.        Judgment: Judgment normal.       Results for orders placed or performed in visit on 05/18/23  POC Urinalysis Dipstick  Result Value Ref Range   Color, UA Yellow    Clarity, UA Clear    Glucose, UA Negative Negative   Bilirubin, UA Negative    Ketones, UA Negative    Spec Grav, UA     Blood, UA Negative    pH, UA 7.0 5.0 - 8.0   Protein, UA Negative Negative   Urobilinogen, UA 0.2 0.2 or 1.0 E.U./dL   Nitrite, UA Negative    Leukocytes, UA Negative Negative   Appearance     Odor      Assessment and Plan  Dysuria Assessment & Plan: Acute, recent urinary tract infection Klebsiella treated with Cipro.  Culture showed sensitivity to Cipro. Patient states symptoms are almost entirely gone just having some internal bladder pressure.  Urinalysis in office today looks clear will send for culture to verify no ongoing infection.  We did briefly discuss use of pure wick and possible connection with urinary tract infections but given she was having this issue prior to using this device and she changes it right daily, I think it is okay for her to continue.  If she does notice more frequent urinary tract infections she can reconsider discussion use of pure wick with her PCP.  Orders: -     POCT urinalysis dipstick -     Urine Culture    No follow-ups on file.   Kerby Nora, MD

## 2023-05-18 NOTE — Assessment & Plan Note (Signed)
Acute, recent urinary tract infection Klebsiella treated with Cipro.  Culture showed sensitivity to Cipro. Patient states symptoms are almost entirely gone just having some internal bladder pressure.  Urinalysis in office today looks clear will send for culture to verify no ongoing infection.  We did briefly discuss use of pure wick and possible connection with urinary tract infections but given she was having this issue prior to using this device and she changes it right daily, I think it is okay for her to continue.  If she does notice more frequent urinary tract infections she can reconsider discussion use of pure wick with her PCP.

## 2023-05-19 LAB — URINE CULTURE
MICRO NUMBER:: 15525131
Result:: NO GROWTH
SPECIMEN QUALITY:: ADEQUATE

## 2023-05-21 NOTE — Progress Notes (Signed)
Clavicle x-ray shows a stable appearing fracture.  The alignment is the same.  You have not had a lot of healing yet.

## 2023-05-22 ENCOUNTER — Telehealth: Payer: Self-pay | Admitting: Family Medicine

## 2023-05-22 NOTE — Telephone Encounter (Signed)
Robin Juarez spoke to the patient. Advise that if her arm and shoulder are feeling better, then it is okay to move her shoulder around as much as she feels comfortable doing. Patient expressed understanding.

## 2023-05-22 NOTE — Telephone Encounter (Signed)
Patient called stating that Dr [name] told her that it was okay to take off her sling every now and them to make sure that her arm did not get stiff. She said that she has been taking it off "quite a lot" and wanted to make sure she wasn't taking it off too much? She said that she is feeling better and is able to use her arm and wrist but she has not been putting pressure on it.  Please advise.

## 2023-05-27 ENCOUNTER — Other Ambulatory Visit: Payer: Self-pay | Admitting: Cardiovascular Disease

## 2023-05-30 ENCOUNTER — Ambulatory Visit (INDEPENDENT_AMBULATORY_CARE_PROVIDER_SITE_OTHER): Payer: Medicare Other

## 2023-05-30 ENCOUNTER — Ambulatory Visit: Payer: Medicare Other | Admitting: Family Medicine

## 2023-05-30 ENCOUNTER — Encounter: Payer: Self-pay | Admitting: Family Medicine

## 2023-05-30 VITALS — BP 132/86 | HR 71 | Ht 60.0 in | Wt 186.4 lb

## 2023-05-30 DIAGNOSIS — S42031D Displaced fracture of lateral end of right clavicle, subsequent encounter for fracture with routine healing: Secondary | ICD-10-CM

## 2023-05-30 DIAGNOSIS — R32 Unspecified urinary incontinence: Secondary | ICD-10-CM | POA: Diagnosis not present

## 2023-05-30 MED ORDER — AMBULATORY NON FORMULARY MEDICATION: 0 refills | Status: DC

## 2023-05-30 NOTE — Progress Notes (Signed)
   I, Stevenson Clinch, CMA acting as a scribe for [name], MD.  Robin Juarez is a 89 y.o. female who presents to [facility] at [facility] today for 3-wk f/u R distal clavicle fx. Pt was last seen by Dr. [name] on 05/09/23 and she was advised to start to d/c the sling and start to work on should mobility.  Today, pt reports improvement of sx since last visit. Has been out of arm sling. Has occasional pain when reaching back. Denies new injury.   [name] has urinary incontinence especially bothersome at bedtime.  This typically requires her to get out of bed multiple times at night.  Her family purchased a pure wick urinary collection system which has been extremely helpful.  They need a prescription to get it reimbursed.  She does not use this device during the day and only uses at bedtime.  Dx imaging: 05/09/23 R clavicle XR             04/19/23 R clavicle & R shoulder XR  Pertinent review of systems: No fevers or chills  Relevant historical information: Urinary incontinence   Exam:  BP 132/86   Pulse 71   Ht 5' (1.524 m)   Wt 186 lb 6.4 oz (84.6 kg)   SpO2 97%   BMI 36.40 kg/m  General: Well Developed, well nourished, and in no acute distress.   MSK: Right shoulder minimal resolving bruising. Nontender distal clavicle. Decreased shoulder motion.    Lab and Radiology Results  X-ray images right clavicle obtained today personally and independently interpreted. No change in angulation or displacement of comminuted distal clavicle fracture.  Minimal callus formation is present. Await formal radiology review     Assessment and Plan: 89 y.o. female with right clavicle fracture after a fall.  Clinically significantly improving since the last visit.  She is ambulating more and using her shoulder more and is not experiencing significant pain. Plan to continue physical therapy and shoulder mobility.  Recheck in 1 month.  Urinary incontinence.  Order  written for pure wick urinary collection system.  This seems to be extremely helpful for her.   PDMP not reviewed this encounter. Orders Placed This Encounter  Procedures   DG Clavicle Right    Standing Status:   Future    Number of Occurrences:   1    Standing Expiration Date:   05/29/2024    Order Specific Question:   Reason for Exam (SYMPTOM  OR DIAGNOSIS REQUIRED)    Answer:   right clavicle fracture    Order Specific Question:   Preferred imaging location?    [name]   Meds ordered this encounter  Medications   AMBULATORY NON FORMULARY MEDICATION    Sig: Pur Wick urinary collection device.  Use at bedtime as needed.  Disp 1    Dispense:  1 each    Refill:  0     Discussed warning signs or symptoms. Please see discharge instructions. Patient expresses understanding.   The above documentation has been reviewed and is accurate and complete [name], M.D.

## 2023-05-30 NOTE — Patient Instructions (Signed)
Thank you for coming in today.   Let me know how you feel.   Ok to move your arm and use it.   Continue PT.   Recheck in 1 month.

## 2023-05-31 NOTE — Progress Notes (Signed)
X-ray of the collarbone looks about the same as it did to the radiologist the last time we took this x-ray.  It is just starting to heal on x-ray now though.

## 2023-06-01 ENCOUNTER — Ambulatory Visit: Payer: Medicare Other | Admitting: Family Medicine

## 2023-06-25 ENCOUNTER — Other Ambulatory Visit (INDEPENDENT_AMBULATORY_CARE_PROVIDER_SITE_OTHER): Payer: Medicare Other

## 2023-06-25 ENCOUNTER — Ambulatory Visit (INDEPENDENT_AMBULATORY_CARE_PROVIDER_SITE_OTHER): Payer: Medicare Other

## 2023-06-25 DIAGNOSIS — Z23 Encounter for immunization: Secondary | ICD-10-CM

## 2023-06-25 DIAGNOSIS — E559 Vitamin D deficiency, unspecified: Secondary | ICD-10-CM

## 2023-06-25 DIAGNOSIS — R202 Paresthesia of skin: Secondary | ICD-10-CM | POA: Diagnosis not present

## 2023-06-25 LAB — VITAMIN B12: Vitamin B-12: 1488 pg/mL — ABNORMAL HIGH (ref 211–911)

## 2023-06-25 LAB — VITAMIN D 25 HYDROXY (VIT D DEFICIENCY, FRACTURES): VITD: 33.28 ng/mL (ref 30.00–100.00)

## 2023-06-27 ENCOUNTER — Other Ambulatory Visit: Payer: Self-pay | Admitting: Family Medicine

## 2023-06-27 MED ORDER — CYANOCOBALAMIN 500 MCG PO TABS: 500.0000 ug | ORAL_TABLET | Freq: Every day | ORAL | Status: AC

## 2023-07-02 ENCOUNTER — Encounter: Payer: Self-pay | Admitting: Family Medicine

## 2023-07-02 ENCOUNTER — Ambulatory Visit (INDEPENDENT_AMBULATORY_CARE_PROVIDER_SITE_OTHER): Payer: Medicare Other

## 2023-07-02 ENCOUNTER — Ambulatory Visit: Payer: Medicare Other | Admitting: Family Medicine

## 2023-07-02 VITALS — BP 130/82 | HR 82 | Ht 60.0 in | Wt 188.0 lb

## 2023-07-02 DIAGNOSIS — S42031D Displaced fracture of lateral end of right clavicle, subsequent encounter for fracture with routine healing: Secondary | ICD-10-CM | POA: Diagnosis not present

## 2023-07-02 DIAGNOSIS — M5416 Radiculopathy, lumbar region: Secondary | ICD-10-CM

## 2023-07-02 NOTE — Progress Notes (Signed)
Rubin Payor, PhD, LAT, ATC acting as a scribe for [name], MD.  Robin Juarez is a 89 y.o. female who presents to [facility] at [facility] today for 3-wk f/u R distal clavicle fx. Pt was last seen by Dr. [name] on 05/30/23 and was advised to cont home health PT and shoulder mobility.  Today, pt reports improved shoulder pain.  She feels basically back to her preinjury baseline for shoulder pain.  She is able to dress herself and bathe and move and go shopping and feels pretty well.  She does note some recurrent chronic low back pain with pain radiating down her legs.  She had an epidural steroid injection in July.  She thinks that has worn off and it might be time to do another injection.  Dx imaging: 05/30/23 R clavicle XR 05/09/23 R clavicle XR             04/19/23 R clavicle & R shoulder XR  Pertinent review of systems: No fevers or chills  Relevant historical information: Coronary artery disease.  History of a stroke.   Exam:  BP 130/82   Pulse 82   Ht 5' (1.524 m)   Wt 188 lb (85.3 kg)   SpO2 94%   BMI 36.72 kg/m  General: Well Developed, well nourished, and in no acute distress.   MSK: Right shoulder nontender normal motion.    Lower extremity strength is intact.    Lab and Radiology Results  X-ray images right clavicle obtained today personally and independently interpreted. Continued displaced distal clavicle fracture without great callus formation. Await formal radiology review    Assessment and Plan: 89 y.o. female with right clavicle fracture.  Injury occurred on August 28 of this year.  Almost 3 months now.  Clinically she is basically back to normal.  X-ray does not show good evidence of bony healing.  It is hard to know what to do here.  I do not think we need to do much more besides watchful waiting.  We could consider a bone stimulator but I am not sure that would improve her clinical outlook.  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  As for recurrent back pain with radiculopathy repeat epidural steroid injection ordered today.   PDMP not reviewed this encounter. Orders Placed This Encounter  Procedures   DG Clavicle Right    Standing Status:   Future    Number of Occurrences:   1    Standing Expiration Date:   07/01/2024    Order Specific Question:   Reason for Exam (SYMPTOM  OR DIAGNOSIS REQUIRED)    Answer:   fx    Order Specific Question:   Preferred imaging location?    Answer:   [name]   DG INJECT DIAG/THERA/INC NEEDLE/CATH/PLC EPI/LUMB/SAC W/IMG    Standing Status:   Future    Standing Expiration Date:   07/01/2024    Order Specific Question:   Reason for Exam (SYMPTOM  OR DIAGNOSIS REQUIRED)    Answer:   Repeat ESI. Level and technique per radiology    Order Specific Question:   Preferred Imaging Location?    Answer:   [id number]    Order Specific Question:   Radiology Contrast Protocol - do NOT remove file path    Answer:   [email or web address]   No orders of the defined types were placed in this encounter.    Discussed warning  signs or symptoms. Please see discharge instructions. Patient expresses understanding.   The above documentation has been reviewed and is accurate and complete [name], M.D.

## 2023-07-02 NOTE — Patient Instructions (Addendum)
Thank you for coming in today.  Please call [facility] ([facility]) at [phone] to schedule your spine injection.    Recheck with me as needed.   Continue arm activity if you can.

## 2023-07-05 ENCOUNTER — Encounter: Payer: Self-pay | Admitting: Family Medicine

## 2023-07-05 ENCOUNTER — Ambulatory Visit (INDEPENDENT_AMBULATORY_CARE_PROVIDER_SITE_OTHER): Payer: Medicare Other | Admitting: Family Medicine

## 2023-07-05 VITALS — BP 124/82 | HR 72 | Temp 98.0°F | Ht 60.0 in | Wt 188.8 lb

## 2023-07-05 DIAGNOSIS — R609 Edema, unspecified: Secondary | ICD-10-CM

## 2023-07-05 NOTE — Progress Notes (Signed)
B leg swelling and irritation. Rare lasix use, often related to difficultly with scheduling the dose and having bathroom available.  Today leg sx are less notable than other recent days.  Not SOB.  She didn't notice much change in her legs with lasix use.  Less ankle edema in the early AM.  She can have a stinging sensation in the lower legs, unclear if more when she has more swelling.    Not currently using pure wick.    Meds, vitals, and allergies reviewed.   ROS: Per HPI unless specifically indicated in ROS section   Nad Ncat Neck supple, no LA Rrr with SEM, h/o mild AS.  Ctab 1+ BLE edema.  Skin well-perfused. Minimal pinkish changes to the bilateral lower shins without frank erythema or ulceration.

## 2023-07-05 NOTE — Patient Instructions (Addendum)
I would try OTC 10-67mmHg knee high compression stockings.  I would wear them during the day and take them off at night.  Take care.  Glad to see you.  Medical supply store may have them, if the pharmacy doesn't have them in stock.  [employer].  Address: [address] [place], [place], [place] [address] Phone: [phone]

## 2023-07-08 NOTE — Assessment & Plan Note (Signed)
Not significantly improved with Lasix previously. Likely component of venous insufficiency.  Does not appear to be in overt heart failure.  Does not appear to be infectious cellulitis.  Discussed. I would try OTC 10-42mmHg knee high compression stockings.  Wear them during the day and take them off at night.  Update me as needed.  She agrees to plan.

## 2023-07-09 ENCOUNTER — Other Ambulatory Visit: Payer: Medicare Other

## 2023-07-10 ENCOUNTER — Other Ambulatory Visit: Payer: Medicare Other

## 2023-07-11 ENCOUNTER — Telehealth: Payer: Self-pay | Admitting: Family Medicine

## 2023-07-11 NOTE — Telephone Encounter (Signed)
I spoke with pt; pt said she is not very SOB at this time. Pt does not sound SOB while talking on phone. Pt said her weight today and last several days has been 188 lbs. No recent wt gain. Pt said she did take Furosemide 20 mg this morning due to some swelling in legs.advised pt when sitting to elevate feet and legs and pt voiced understanding. Pt said would talk with Dr Para March on 07/12/23 about taking the Furosemide more often than prn. UC & ED precautions given and pt voiced understanding. Sending note to Dr Para March.

## 2023-07-11 NOTE — Telephone Encounter (Signed)
Per appt notes pt already has appt with Dr Para March scheduled for 07/12/23 at 3pm. Sending note to Dr Para March and Para March  pool.

## 2023-07-11 NOTE — Telephone Encounter (Signed)
Agree. Thanks

## 2023-07-11 NOTE — Telephone Encounter (Signed)
FYI: This call has been transferred to Access Nurse. Once the result note has been entered staff can address the message at that time.  Patient called in with the following symptoms:  Red Word: wheezing   Please advise at [facility] [phone]  Message is routed to Provider Pool and [facility] Triage

## 2023-07-11 NOTE — Telephone Encounter (Signed)
If profoundly/acutely SOB, then rec ER.  If weight is up, then could try one dose of lasix today.  Thanks.

## 2023-07-12 ENCOUNTER — Ambulatory Visit: Payer: Medicare Other | Admitting: Family Medicine

## 2023-07-12 ENCOUNTER — Encounter: Payer: Self-pay | Admitting: Family Medicine

## 2023-07-12 ENCOUNTER — Ambulatory Visit (INDEPENDENT_AMBULATORY_CARE_PROVIDER_SITE_OTHER)
Admission: RE | Admit: 2023-07-12 | Discharge: 2023-07-12 | Disposition: A | Discharge status: Home / Self Care | Payer: Medicare Other | Source: Ambulatory Visit | Attending: Family Medicine | Admitting: Family Medicine

## 2023-07-12 VITALS — BP 136/80 | HR 85 | Temp 97.7°F | Ht 60.0 in | Wt 189.2 lb

## 2023-07-12 DIAGNOSIS — R062 Wheezing: Secondary | ICD-10-CM

## 2023-07-12 DIAGNOSIS — R0602 Shortness of breath: Secondary | ICD-10-CM | POA: Diagnosis not present

## 2023-07-12 NOTE — Progress Notes (Signed)
Not a new issue for patient, "it's been going on for 2 or 3 years."  Occ with sleep disruption from sx.  Less sx last night and today after taking lasix yesterday, unclear if that was causative.  She has some SOBOE, ie with therapy.    She hadn't taken lasix recently except for yesterday.    She can feel/hear a wheeze occ.  She did home PT and therapist could hear patient wheeze prev this year.  Sx not noted daily but noted approx weekly.  No intervention seems to make it better or worse, but it may be more common after eating.  Some occ heartburn.    No CP.  She has had gradually inc in weight over the last few years.  Nonsmoker.    Her weight is usually ~188 lbs but that isn't an early AM weight.    Meds, vitals, and allergies reviewed.   ROS: Per HPI unless specifically indicated in ROS section   GEN: nad, alert and oriented HEENT: ncat NECK: supple w/o LA CV: rrr. PULM: ctab, no inc wob ABD: soft, +bs EXT: no edema SKIN: no acute rash

## 2023-07-12 NOTE — Patient Instructions (Signed)
Check your weight in the early mornings.  If your weight is 187 lbs or above, I would take a dose of lasix/fluid pill that day.    If early AM weight is below 187, skip lasix that day.   Let us know about wheeze, weight, and lasix use in a few days.    Go to the lab on the way out.   If you have mychart we'll likely use that to update you.     Take care.  Glad to see you.

## 2023-07-13 LAB — COMPREHENSIVE METABOLIC PANEL
ALT: 10 U/L (ref 0–35)
AST: 15 U/L (ref 0–37)
Albumin: 4.6 g/dL (ref 3.5–5.2)
Alkaline Phosphatase: 86 U/L (ref 39–117)
BUN: 11 mg/dL (ref 6–23)
CO2: 33 meq/L — ABNORMAL HIGH (ref 19–32)
Calcium: 9.8 mg/dL (ref 8.4–10.5)
Chloride: 98 meq/L (ref 96–112)
Creatinine, Ser: 0.83 mg/dL (ref 0.40–1.20)
GFR: 62.22 mL/min (ref >60.00)
Glucose, Bld: 93 mg/dL (ref 70–99)
Potassium: 4.4 meq/L (ref 3.5–5.1)
Sodium: 140 meq/L (ref 135–145)
Total Bilirubin: 0.6 mg/dL (ref 0.2–1.2)
Total Protein: 7.5 g/dL (ref 6.0–8.3)

## 2023-07-13 LAB — CBC WITH DIFFERENTIAL/PLATELET
Basophils Absolute: 0.3 10*3/uL — ABNORMAL HIGH (ref 0.0–0.1)
Basophils Relative: 2.8 % (ref 0.0–3.0)
Eosinophils Absolute: 0.2 10*3/uL (ref 0.0–0.7)
Eosinophils Relative: 2.6 % (ref 0.0–5.0)
HCT: 42.5 % (ref 36.0–46.0)
Hemoglobin: 14.1 g/dL (ref 12.0–15.0)
Lymphocytes Relative: 28 % (ref 12.0–46.0)
Lymphs Abs: 2.7 10*3/uL (ref 0.7–4.0)
MCHC: 33.2 g/dL (ref 30.0–36.0)
MCV: 99.8 fL (ref 78.0–100.0)
Monocytes Absolute: 0.6 10*3/uL (ref 0.1–1.0)
Monocytes Relative: 6.7 % (ref 3.0–12.0)
Neutro Abs: 5.8 10*3/uL (ref 1.4–7.7)
Neutrophils Relative %: 59.9 % (ref 43.0–77.0)
Platelets: 264 10*3/uL (ref 150.0–400.0)
RBC: 4.25 Mil/uL (ref 3.87–5.11)
RDW: 14.2 % (ref 11.5–15.5)
WBC: 9.6 10*3/uL (ref 4.0–10.5)

## 2023-07-13 LAB — BRAIN NATRIURETIC PEPTIDE: Pro B Natriuretic peptide (BNP): 49 pg/mL (ref 0.0–100.0)

## 2023-07-15 DIAGNOSIS — R0602 Shortness of breath: Secondary | ICD-10-CM | POA: Insufficient documentation

## 2023-07-15 NOTE — Assessment & Plan Note (Signed)
See notes on labs and CXR.   Asked pt to check weight in the early mornings.  If weight is 187 lbs or above, I would take a dose of lasix/fluid pill that day.    If early AM weight is below 187, skip lasix that day.   Let us know about wheeze, weight, and lasix use in a few days.    Also could be GERD related.  Ddx d/w pt.  Okay for outpatient f/u.

## 2023-07-23 ENCOUNTER — Telehealth: Payer: Self-pay | Admitting: Family Medicine

## 2023-07-23 NOTE — Progress Notes (Signed)
X-ray of the right collarbone shows some healing of the broken bone.

## 2023-07-23 NOTE — Telephone Encounter (Signed)
Patient called to give update on what her weight was, how often she took lasix and if she had wheezing. Goes as follows,   11/25 weight 184.5, took lasix no wheezing  11/26 weight 186, took lasix no wheezing 11/27 weight 184, took lasix no wheezing  11/28 weight 182.5, no lasix no wheezing  11/29 weight 182, no lasix no wheezing  11/30 weight 182, no lasix no wheezing 12/1 weight 183.5, no lasix no wheezing  12/2 weight 184, took lasix no wheezing   Patient says there was a small weight loss, she is eating smaller portions.

## 2023-07-23 NOTE — Discharge Instructions (Signed)

## 2023-07-24 ENCOUNTER — Inpatient Hospital Stay
Admission: RE | Admit: 2023-07-24 | Discharge: 2023-07-24 | Disposition: A | Discharge status: Home / Self Care | Payer: Medicare Other | Source: Ambulatory Visit | Attending: Family Medicine | Admitting: Family Medicine

## 2023-07-25 NOTE — Telephone Encounter (Signed)
Thanks for the update.   I would continue as is.    If weight is 184 lbs or above, I would take a dose of lasix/fluid pill that day.     If early AM weight is below 184, skip lasix that day.  Thanks.

## 2023-07-26 ENCOUNTER — Encounter (HOSPITAL_COMMUNITY): Payer: Self-pay

## 2023-07-26 ENCOUNTER — Other Ambulatory Visit: Payer: Self-pay

## 2023-07-26 ENCOUNTER — Emergency Department (HOSPITAL_COMMUNITY)
Admission: EM | Admit: 2023-07-26 | Discharge: 2023-07-26 | Disposition: A | Discharge status: Home / Self Care | Payer: Medicare Other | Attending: Emergency Medicine | Admitting: Emergency Medicine

## 2023-07-26 DIAGNOSIS — Z7982 Long term (current) use of aspirin: Secondary | ICD-10-CM | POA: Insufficient documentation

## 2023-07-26 DIAGNOSIS — R42 Dizziness and giddiness: Secondary | ICD-10-CM | POA: Diagnosis not present

## 2023-07-26 DIAGNOSIS — Z20822 Contact with and (suspected) exposure to covid-19: Secondary | ICD-10-CM | POA: Insufficient documentation

## 2023-07-26 DIAGNOSIS — R6889 Other general symptoms and signs: Secondary | ICD-10-CM

## 2023-07-26 DIAGNOSIS — R531 Weakness: Secondary | ICD-10-CM | POA: Diagnosis present

## 2023-07-26 LAB — BASIC METABOLIC PANEL
Anion gap: 10 (ref 5–15)
BUN: 12 mg/dL (ref 8–23)
CO2: 24 mmol/L (ref 22–32)
Calcium: 8.5 mg/dL — ABNORMAL LOW (ref 8.9–10.3)
Chloride: 104 mmol/L (ref 98–111)
Creatinine, Ser: 0.75 mg/dL (ref 0.44–1.00)
GFR, Estimated: >60 mL/min (ref >60)
Glucose, Bld: 98 mg/dL (ref 70–99)
Potassium: 3.5 mmol/L (ref 3.5–5.1)
Sodium: 138 mmol/L (ref 135–145)

## 2023-07-26 LAB — CBC
HCT: 39.7 % (ref 36.0–46.0)
Hemoglobin: 13.1 g/dL (ref 12.0–15.0)
MCH: 32.6 pg (ref 26.0–34.0)
MCHC: 33 g/dL (ref 30.0–36.0)
MCV: 98.8 fL (ref 80.0–100.0)
Platelets: 220 10*3/uL (ref 150–400)
RBC: 4.02 MIL/uL (ref 3.87–5.11)
RDW: 13.4 % (ref 11.5–15.5)
WBC: 7.2 10*3/uL (ref 4.0–10.5)
nRBC: 0 % (ref 0.0–0.2)

## 2023-07-26 LAB — URINALYSIS, ROUTINE W REFLEX MICROSCOPIC
Bilirubin Urine: NEGATIVE
Glucose, UA: NEGATIVE mg/dL
Hgb urine dipstick: NEGATIVE
Ketones, ur: NEGATIVE mg/dL
Leukocytes,Ua: NEGATIVE
Nitrite: NEGATIVE
Protein, ur: NEGATIVE mg/dL
Specific Gravity, Urine: 1.004 — ABNORMAL LOW (ref 1.005–1.030)
pH: 7 (ref 5.0–8.0)

## 2023-07-26 LAB — SARS CORONAVIRUS 2 BY RT PCR: SARS Coronavirus 2 by RT PCR: NEGATIVE

## 2023-07-26 LAB — TROPONIN I (HIGH SENSITIVITY): Troponin I (High Sensitivity): 6 ng/L (ref <18)

## 2023-07-26 LAB — CBG MONITORING, ED: Glucose-Capillary: 102 mg/dL — ABNORMAL HIGH (ref 70–99)

## 2023-07-26 NOTE — Discharge Instructions (Signed)
You should return to the ER if you have chest pain or pressure, difficulty breathing, loss of consciousness, or any other emergency medicine concerns.  Call your doctor's office tomorrow to request a follow up visit for your symptoms.

## 2023-07-26 NOTE — Telephone Encounter (Signed)
Spoke with pt's husband, Steward Ros and he states that he had to call EMS last night for her; they went to [facility] ED. States, "She had an attack." Per the notes, pt was having issues with weakness and dizziness.   Pt's husband is aware of Dr. Lianne Bushy below message.

## 2023-07-26 NOTE — ED Provider Notes (Signed)
[place] EMERGENCY DEPARTMENT AT [facility] Provider Note   CSN: [id number] Arrival date & time: 07/26/23  0127     History  Chief Complaint  Patient presents with   Weakness    Robin Juarez is a 89 y.o. female presenting to the ED with complaint of weakness.  The patient reports that she had difficulty sleeping this evening which is very unusual for her.  She said sometime around midnight she had a sudden sensation of "I was going to die".  She describes some lightheadedness feeling.  Denies chest pressure or difficulty breathing.  She said this lasted about 3 minutes and resolved but scared her enough to call EMS.  She is never had this trauma for.  She denies any history of coronary disease or arrhythmia or PE.   On arrival here she is asymptomatic and back to baseline  HPI     Home Medications Prior to Admission medications   Medication Sig Start Date End Date Taking? Authorizing Provider  AMBULATORY NON FORMULARY MEDICATION Pur Shonna Chock urinary collection device.  Use at bedtime as needed.  Disp 1 05/30/23   Rodolph Bong, MD  aspirin EC 81 MG tablet Take 1 tablet (81 mg total) by mouth daily. Swallow whole. 01/11/22   Azalee Course, PA  Baclofen 5 MG TABS TAKE 1 TABLET BY MOUTH TWICE  DAILY AS NEEDED FOR MUSCLE  SPASMS 12/13/22   Joaquim Nam, MD  busPIRone (BUSPAR) 10 MG tablet as needed.    [provider]  Cholecalciferol (VITAMIN D3) 125 MCG (5000 UT) CAPS Take 1 capsule (5,000 Units total) by mouth daily. 04/25/23   Joaquim Nam, MD  Coenzyme Q10 (CO Q 10 PO) Take 1 tablet by mouth daily.    [provider]  cyanocobalamin (VITAMIN B12) 500 MCG tablet Take 1 tablet (500 mcg total) by mouth daily. 06/27/23   Joaquim Nam, MD  DULoxetine (CYMBALTA) 30 MG capsule Take 90 mg by mouth daily.     [provider]  folic acid (FOLVITE) 800 MCG tablet Take 800 mcg by mouth daily.     [provider]  furosemide (LASIX) 20 MG  tablet TAKE 1 TABLET (20 MG TOTAL) BY MOUTH DAILY AS NEEDED FOR FLUID. 12/19/22   Joaquim Nam, MD  gabapentin (NEURONTIN) 300 MG capsule 1 tab noon, 1 tab evening, 2 tabs bedtime (4 tabs total) 04/03/22   Joaquim Nam, MD  KRILL OIL PO Take 100 mg by mouth daily.    [provider]  lamoTRIgine (LAMICTAL) 100 MG tablet Take 1.5 tablets (150 mg total) by mouth daily after supper. 02/19/23   Joaquim Nam, MD  metoprolol succinate (TOPROL-XL) 25 MG 24 hr tablet TAKE 1 TABLET BY MOUTH ONCE  DAILY 05/28/23   Runell Gess, MD  OLANZapine (ZYPREXA) 2.5 MG tablet Take 1 tablet (2.5 mg total) by mouth at bedtime. 08/05/15   Joaquim Nam, MD  rosuvastatin (CRESTOR) 5 MG tablet Take 1 tablet by mouth three days per week (MWF) 04/05/23   Joaquim Nam, MD      Allergies    Digoxin, Nitrofurantoin, Alendronate sodium, Cephalosporins, Lisinopril, Phenobarbital-belladonna alk, Praluent [alirocumab], Repatha [evolocumab], Ritalin [methylphenidate hcl], Statins, Trimethoprim, Clavulanic acid, Omeprazole, Oxcarbazepine, Sulfa antibiotics, Tetracycline, and Zetia [ezetimibe]    Review of Systems   Review of Systems  Physical Exam Updated Vital Signs BP (!) 160/67   Pulse 69   Temp 98.1 F (36.7 C) (Oral)  Resp 14   Ht 5' (1.524 m)   Wt 83 kg   SpO2 97%   BMI 35.74 kg/m  Physical Exam Constitutional:      General: She is not in acute distress. HENT:     Head: Normocephalic and atraumatic.  Eyes:     Conjunctiva/sclera: Conjunctivae normal.     Pupils: Pupils are equal, round, and reactive to light.  Cardiovascular:     Rate and Rhythm: Normal rate and regular rhythm.  Pulmonary:     Effort: Pulmonary effort is normal. No respiratory distress.  Abdominal:     General: There is no distension.     Tenderness: There is no abdominal tenderness.  Skin:    General: Skin is warm and dry.  Neurological:     General: No focal deficit present.     Mental Status: She is  alert and oriented to person, place, and time. Mental status is at baseline.  Psychiatric:        Mood and Affect: Mood normal.        Behavior: Behavior normal.     ED Results / Procedures / Treatments   Labs (all labs ordered are listed, but only abnormal results are displayed) Labs Reviewed  BASIC METABOLIC PANEL - Abnormal; Notable for the following components:      Result Value   Calcium 8.5 (*)    All other components within normal limits  URINALYSIS, ROUTINE W REFLEX MICROSCOPIC - Abnormal; Notable for the following components:   Color, Urine COLORLESS (*)    Specific Gravity, Urine 1.004 (*)    All other components within normal limits  CBG MONITORING, ED - Abnormal; Notable for the following components:   Glucose-Capillary 102 (*)    All other components within normal limits  SARS CORONAVIRUS 2 BY RT PCR  CBC  TROPONIN I (HIGH SENSITIVITY)    EKG EKG Interpretation Date/Time:  Thursday July 26 2023 01:42:20 EST Ventricular Rate:  74 PR Interval:  168 QRS Duration:  101 QT Interval:  392 QTC Calculation: 435 R Axis:   19  Text Interpretation: Sinus rhythm Confirmed by Alvester Chou [phone]) on 07/26/2023 1:44:38 AM  Radiology No results found.  Procedures Procedures    Medications Ordered in ED Medications - No data to display  ED Course/ Medical Decision Making/ A&P Clinical Course as of 07/26/23 0533  Thu Jul 26, 2023  2952 Patient is reassessed and remains asymptomatic.  Blood pressure 150 systolic at this time.  Telemetry showing continued sinus rhythm.  Her husband and daughter now present at the bedside.  Reviewed follow-up instructions with her PCP but at this point I do not see indication for medical admission.  Her workup has been unremarkable.  They are comfortable going home [MT]    Clinical Course User Index [MT] Alaia Lordi, Kermit Balo, MD                                 Medical Decision Making Amount and/or Complexity of Data  Reviewed Labs: ordered.   This patient presents to the ED with concern for feeling of doom, anxiety. This involves an extensive number of treatment options, and is a complaint that carries with it a high risk of complications and morbidity.  The differential diagnosis includes anxiety vs arrhythmia vs anemia vs atypical ACS vs other  No localizing stroke symptoms, doubt CVA She is asymptomatic on arrival Husband reports she takes xanax  as needed for anxiety, which suggests hx of anxiety.  This may have been at play tonight due to her reported difficulty sleeping.'  Additional history obtained from EMS, husband  External records from outside source obtained and reviewed including echo March 2024 with EF 60-65%, no reported high grade valvular disease  I ordered and personally interpreted labs.  The pertinent results include:  no emergent findings.  Glucose, hgb wnl.  UA without evidence of infection.  Trop normal.  The patient was maintained on a cardiac monitor.  I personally viewed and interpreted the cardiac monitored which showed an underlying rhythm of: NSR  Per my interpretation the patient's ECG shows NSR  I have reviewed the patients home medicines and have made adjustments as needed  Test Considered: doubt acute PE, PNA, PTX  After the interventions noted above, I reevaluated the patient and found that they have: improved   Dispostion:  After consideration of the diagnostic results and the patients response to treatment, I feel that the patent would benefit from close outpatient follow up.         Final Clinical Impression(s) / ED Diagnoses Final diagnoses:  Feeling unwell    Rx / DC Orders ED Discharge Orders     None         Heylee Tant, Kermit Balo, MD 07/26/23 [phone]

## 2023-07-26 NOTE — ED Triage Notes (Signed)
Pt came from home. Pt could not go to sleep which is abnormal for her. Pt started have a strange feeling. Pt states that she felt like she was dying. Weakness soon followed. Negative stroke screen for EMS. No complaints of pain.  EMS VS  200/110 Other vital signs stable  Hx of HTN and depression

## 2023-07-27 NOTE — Telephone Encounter (Signed)
Noted.  Please see about getting ER f/u next week.  Thanks.

## 2023-07-27 NOTE — Telephone Encounter (Signed)
Patient scheduled for 12/16.She said that's the only time she can come due to other appointments that she has.

## 2023-07-27 NOTE — Telephone Encounter (Signed)
Please reach out to patient and schedule a ED follow up. Thank you.

## 2023-07-27 NOTE — Telephone Encounter (Signed)
Noted. Thanks.

## 2023-07-31 ENCOUNTER — Ambulatory Visit: Payer: Medicare Other | Admitting: Family Medicine

## 2023-07-31 ENCOUNTER — Encounter: Payer: Self-pay | Admitting: Family Medicine

## 2023-07-31 VITALS — BP 134/70 | HR 71 | Temp 98.4°F | Ht 60.0 in | Wt 184.4 lb

## 2023-07-31 DIAGNOSIS — R0602 Shortness of breath: Secondary | ICD-10-CM

## 2023-07-31 DIAGNOSIS — E782 Mixed hyperlipidemia: Secondary | ICD-10-CM

## 2023-07-31 DIAGNOSIS — F411 Generalized anxiety disorder: Secondary | ICD-10-CM

## 2023-07-31 MED ORDER — FUROSEMIDE 20 MG PO TABS: 20.0000 mg | ORAL_TABLET | Freq: Every day | ORAL | Status: DC | PRN

## 2023-07-31 NOTE — Progress Notes (Unsigned)
She stopped crestor about 1 week ago and noted that she felt better in general.  Discussed.  She hasn't been wheezing.  D/w pt about using lasix if weight is above 184 lbs.  She was 181.5 lbs this AM.  She didn't take lasix today.    Discussed recent events.  She had gone to bed but hadn't gone to sleep.  This was atypical.  Around midnight on 07/26/23, she had a terrible feeling and "I thought I was going to die."  She woke up her husband, called 911.  To ER, reassuring exam.   She noted that she'll take an extra breath occ and will notice quick onset stinging in the ankles at the time.  No foot sx o/w.  No arm sx.  No rash.  Daily sx, intermittently.  No foot drop, no weakness.    Meds, vitals, and allergies reviewed.   ROS: Per HPI unless specifically indicated in ROS section   Nad Ncat Neck supple, no LA Rrr Ctab Abd soft, not ttp Skin well-perfused. Normal sensation, inspection and intact DP pulse.  Normal strength B feet.

## 2023-07-31 NOTE — Patient Instructions (Signed)
I would stay off crestor for now and update me in a few days.   Take care.  Glad to see you.

## 2023-08-01 NOTE — Assessment & Plan Note (Signed)
She could have had a panic attack that was exacerbated by relative sleep deprivation.  She had a reassuring exam and I think it makes sense not to change her medications in the meantime.  She can let me know if she has further events.

## 2023-08-01 NOTE — Assessment & Plan Note (Signed)
She felt generally and diffusely better off Crestor.  I asked her to hold this medication for a few more days and then update me about her situation.

## 2023-08-01 NOTE — Assessment & Plan Note (Addendum)
History of.  She is not short of breath now.  She is not wheezing.  Discussed using Lasix if her weight is 184 pounds or above in the mornings.  Lungs are clear and she can update me as needed.  She noted that she'll take an extra breath occ and will notice quick onset stinging in the ankles at the time.  No foot sx o/w.  No arm sx.  No rash.  Daily sx, intermittently.  No foot drop, no weakness.  I do not have a good explanation for her foot symptoms and I think it makes sense to observe for now.  Discussed with patient.  She agrees.

## 2023-08-02 ENCOUNTER — Telehealth: Payer: Self-pay | Admitting: Family Medicine

## 2023-08-02 NOTE — Telephone Encounter (Signed)
Pt called in and stated that she is the same and her legs is stinging pt stated that she just want to keep Para March updated

## 2023-08-05 NOTE — Telephone Encounter (Signed)
Please update patient.  She has degenerative changes in her back seen on prior xrays and I don't know if that contributes. I am going to ask for input from Dr. [name] in the meantime.    I thank all involved.   ============= Dr. [name]- patient has recurrent quick onset B ankle pain/stinging w/o foot drop.  Noted when taking a deep breath.  Is this possible due to transient postural changes causing L spine nerve root impingement?

## 2023-08-06 ENCOUNTER — Inpatient Hospital Stay: Payer: Medicare Other | Admitting: Family Medicine

## 2023-08-06 NOTE — Telephone Encounter (Signed)
Called patient she wanted to let you know that the Doctor from the hospital (patent states you will know what she is talking about) thinks it is her back. And that Dr. [name] has reached out and is setting her up for injections in her back.

## 2023-08-06 NOTE — Telephone Encounter (Signed)
Noted. Thanks.

## 2023-08-07 NOTE — Telephone Encounter (Signed)
Dr. [name]- thanks.  We'll update patient.   ==================== Please see below and update patient. Thanks.

## 2023-08-07 NOTE — Telephone Encounter (Signed)
It could be her back.  It looks like in November I ordered an epidural steroid injection but she never did get it scheduled.  She could call [facility] at [phone] to schedule that injection.  That would be the obvious next thing to do for what looks like lumbar radiculopathy.

## 2023-08-07 NOTE — Telephone Encounter (Signed)
Spoke with patient who advised that she will call and make appointment today.

## 2023-08-08 NOTE — Telephone Encounter (Signed)
Noted. Thanks.

## 2023-08-19 ENCOUNTER — Other Ambulatory Visit: Payer: Self-pay | Admitting: Family Medicine

## 2023-08-24 NOTE — Discharge Instructions (Signed)
 Post Procedure Spinal Discharge Instruction Sheet  You may resume a regular diet and any medications that you routinely take (including pain medications) unless otherwise noted by MD.  No driving day of procedure.  Light activity throughout the rest of the day.  Do not do any strenuous work, exercise, bending or lifting.  The day following the procedure, you can resume normal physical activity but you should refrain from exercising or physical therapy for at least three days thereafter.  You may apply ice to the injection site, 20 minutes on, 20 minutes off, as needed. Do not apply ice directly to skin.    Common Side Effects:  Headaches- take your usual medications as directed by your physician.  Increase your fluid intake.  Caffeinated beverages may be helpful.  Lie flat in bed until your headache resolves.  Restlessness or inability to sleep- you may have trouble sleeping for the next few days.  Ask your referring physician if you need any medication for sleep.  Facial flushing or redness- should subside within a few days.  Increased pain- a temporary increase in pain a day or two following your procedure is not unusual.  Take your pain medication as prescribed by your referring physician.  Leg cramps  Please contact our office at [phone] for the following symptoms: Fever greater than 100 degrees. Headaches unresolved with medication after 2-3 days. Increased swelling, pain, or redness at injection site.   Thank you for visiting [facility] today.   YOU MAY RESUME YOUR ASPIRIN ANYTIME AFTER PROCEDURE TODAY

## 2023-08-27 ENCOUNTER — Inpatient Hospital Stay
Admission: RE | Admit: 2023-08-27 | Discharge: 2023-08-27 | Disposition: A | Discharge status: Home / Self Care | Payer: Medicare Other | Source: Ambulatory Visit | Attending: Family Medicine | Admitting: Family Medicine

## 2023-08-28 ENCOUNTER — Ambulatory Visit: Payer: Medicare Other | Admitting: Family Medicine

## 2023-08-28 NOTE — Discharge Instructions (Signed)

## 2023-08-29 ENCOUNTER — Encounter: Payer: Self-pay | Admitting: Family Medicine

## 2023-08-29 ENCOUNTER — Ambulatory Visit
Admission: RE | Admit: 2023-08-29 | Discharge: 2023-08-29 | Disposition: A | Discharge status: Home / Self Care | Payer: Medicare Other | Source: Ambulatory Visit | Attending: Family Medicine | Admitting: Family Medicine

## 2023-08-29 ENCOUNTER — Ambulatory Visit: Payer: Medicare Other | Admitting: Family Medicine

## 2023-08-29 VITALS — BP 140/88 | HR 67 | Temp 97.7°F | Ht 60.0 in | Wt 183.5 lb

## 2023-08-29 DIAGNOSIS — J101 Influenza due to other identified influenza virus with other respiratory manifestations: Secondary | ICD-10-CM

## 2023-08-29 DIAGNOSIS — J029 Acute pharyngitis, unspecified: Secondary | ICD-10-CM

## 2023-08-29 LAB — POC INFLUENZA A&B (BINAX/QUICKVUE)
Influenza A, POC: POSITIVE — AB
Influenza B, POC: NEGATIVE

## 2023-08-29 LAB — POCT RAPID STREP A (OFFICE): Rapid Strep A Screen: NEGATIVE

## 2023-08-29 LAB — POC COVID19 BINAXNOW: SARS Coronavirus 2 Ag: NEGATIVE

## 2023-08-29 MED ORDER — OSELTAMIVIR PHOSPHATE 75 MG PO CAPS: 75.0000 mg | ORAL_CAPSULE | Freq: Two times a day (BID) | ORAL | 0 refills | Status: DC

## 2023-08-29 NOTE — Progress Notes (Signed)
 Robin Norrod T. Riven Beebe, MD, CAQ Sports Medicine [facility] at [facility] [address] [place], [address]  Phone: [phone]  FAX: [phone]  Robin Juarez - 89 y.o. female  MRN [id number]  Date of Birth: 1931-02-09  Date: 08/29/2023  PCP: Robin Arlyss RAMAN, MD  Referral: Robin Arlyss RAMAN, MD  Chief Complaint  Patient presents with   Sore Throat   Nasal Congestion   Subjective:   QUITA Juarez presents with runny nose, sneezing, cough, sore throat, malaise, myalgias, arthralgia, chills, and fever.  ? recent exposure to others with similar symptoms.   Night before last, got some sore rhroat and congestion.  Yesterday, started to feel a little bit better.   Cancelled her appointment yesterday.  Last night, started to get worse.  ST now and some congestion  Feels really bad No fever Diarrhea two or three times  The patent denies sore throat as the primary complaint. Denies sthortness of breath/wheezing, otalgia, facial pain, abdominal pain  Generally feels terrible  Tmax: uknown  ROS as above, eating and drinking - tolerating PO. Urinating normally. No excessive vomitting or diarrhea.   Objective:   Blood pressure (!) 140/88, pulse 67, temperature 97.7 F (36.5 C), temperature source Temporal, height 5' (1.524 m), weight 183 lb 8 oz (83.2 kg), SpO2 94%.   Gen: WDWN, cooperative. Globally Non-toxic HEENT: Normocephalic and atraumatic. Throat clear, w/o exudate, R TM clear, L TM - good landmarks, No fluid present. rhinnorhea. No frontal or maxillary sinus T. MMM NECK: Anterior cervical  LAD is present CV: RRR, No M/G/R, cap refill <2 sec PULM: Breathing comfortably in no respiratory distress. no wheezing, crackles, rhonchi ABD: S,NT,ND,+BS. No HSM. No rebound. MSK: Nml gait  Results for orders placed or performed in visit on 08/29/23  POCT rapid strep A   Collection Time: 08/29/23 11:43 AM  Result Value Ref Range   Rapid  Strep A Screen Negative Negative  POC Influenza A&B (Binax test)   Collection Time: 08/29/23 11:43 AM  Result Value Ref Range   Influenza A, POC Positive (A) Negative   Influenza B, POC Negative Negative  POC COVID-19   Collection Time: 08/29/23 11:44 AM  Result Value Ref Range   SARS Coronavirus 2 Ag Negative Negative    Assessment and Plan:     ICD-10-CM   1. Influenza A  J10.1     2. Sore throat  J02.9 POC COVID-19    POCT rapid strep A    POC Influenza A&B (Binax test)     The patient's clinical exam and history is consistent with a diagnosis of influenza.  Supportive care, fluids, cough medicines as needed, and anti-pyretics. Infection control emphasized, including OOW or school until AF 24 hours.  Additionally, the patient's husband was given Tamiflu  prophylaxis, and he specifically asked for such.  Follow-up: No follow-ups on file.  Meds ordered this encounter  Medications   oseltamivir  (TAMIFLU ) 75 MG capsule    Sig: Take 1 capsule (75 mg total) by mouth 2 (two) times daily.    Dispense:  10 capsule    Refill:  0   There are no discontinued medications. Orders Placed This Encounter  Procedures   POC COVID-19   POCT rapid strep A   POC Influenza A&B (Binax test)    Signed,  Robin Filippone T. Kalicia Dufresne, MD   Outpatient Encounter Medications as of 08/29/2023  Medication Sig   AMBULATORY NON FORMULARY MEDICATION Pur Leo urinary collection device.  Use  at bedtime as needed.  Disp 1   aspirin  EC 81 MG tablet Take 1 tablet (81 mg total) by mouth daily. Swallow whole.   Baclofen  5 MG TABS TAKE 1 TABLET BY MOUTH TWICE  DAILY AS NEEDED FOR MUSCLE  SPASMS   busPIRone (BUSPAR) 10 MG tablet as needed.   Cholecalciferol  (VITAMIN D3) 125 MCG (5000 UT) CAPS Take 1 capsule (5,000 Units total) by mouth daily.   Coenzyme Q10 (CO Q 10 PO) Take 1 tablet by mouth daily.   cyanocobalamin  (VITAMIN B12) 500 MCG tablet Take 1 tablet (500 mcg total) by mouth daily.   DULoxetine   (CYMBALTA ) 30 MG capsule Take 90 mg by mouth daily.    folic acid  (FOLVITE ) 800 MCG tablet Take 800 mcg by mouth daily.    furosemide  (LASIX ) 20 MG tablet Take 1 tablet (20 mg total) by mouth daily as needed for fluid. If AM weight is 184 lbs or higher   gabapentin  (NEURONTIN ) 300 MG capsule 1 tab noon, 1 tab evening, 2 tabs bedtime (4 tabs total)   KRILL OIL PO Take 100 mg by mouth daily.   lamoTRIgine  (LAMICTAL ) 100 MG tablet Take 1.5 tablets (150 mg total) by mouth daily after supper.   metoprolol  succinate (TOPROL -XL) 25 MG 24 hr tablet TAKE 1 TABLET BY MOUTH ONCE  DAILY   OLANZapine  (ZYPREXA ) 2.5 MG tablet Take 1 tablet (2.5 mg total) by mouth at bedtime.   oseltamivir  (TAMIFLU ) 75 MG capsule Take 1 capsule (75 mg total) by mouth 2 (two) times daily.   No facility-administered encounter medications on file as of 08/29/2023.

## 2023-09-06 ENCOUNTER — Other Ambulatory Visit: Payer: Self-pay | Admitting: Family Medicine

## 2023-09-06 ENCOUNTER — Ambulatory Visit
Admission: RE | Admit: 2023-09-06 | Discharge: 2023-09-06 | Disposition: A | Discharge status: Home / Self Care | Payer: Medicare Other | Source: Ambulatory Visit | Attending: Family Medicine | Admitting: Family Medicine

## 2023-09-06 DIAGNOSIS — M5416 Radiculopathy, lumbar region: Secondary | ICD-10-CM

## 2023-09-06 MED ORDER — METHYLPREDNISOLONE ACETATE 40 MG/ML INJ SUSP (RADIOLOG
80.0000 mg | Freq: Once | INTRAMUSCULAR | Status: AC
  Administered 2023-09-06: 80 mg via EPIDURAL

## 2023-09-06 MED ORDER — IOPAMIDOL (ISOVUE-M 200) INJECTION 41%
1.0000 mL | Freq: Once | INTRAMUSCULAR | Status: AC
  Administered 2023-09-06: 1 mL via EPIDURAL

## 2023-09-06 NOTE — Discharge Instructions (Signed)

## 2023-09-07 ENCOUNTER — Telehealth: Payer: Self-pay | Admitting: Family Medicine

## 2023-09-07 NOTE — Telephone Encounter (Signed)
FYI: Spoke with patient who advised that she did get a back injection and she is still having to take deep breaths and stinging. I told her to see how she does and feel over the weekend and to give [place] a call Monday or Tuesday morning to let us know how she is doing and if she is noticing a a difference. She was in agreement

## 2023-09-07 NOTE — Telephone Encounter (Signed)
Copied from CRM [id number]. Topic: General - Other >> Sep 07, 2023 11:17 AM Florestine Avers wrote: Reason for CRM: Patient called in stating got back shot yesterday still having deep breath and stinging. Patient says maybe its too soon to tell, she said she will see the doctor soon. Patient requesting a call back.

## 2023-09-09 NOTE — Telephone Encounter (Signed)
Noted. Thanks.

## 2023-09-17 ENCOUNTER — Telehealth: Payer: Self-pay | Admitting: Family Medicine

## 2023-09-17 NOTE — Telephone Encounter (Signed)
Patient husband came in and dropped a new paper clipping off that is similar to what patient is going through. Placed in Duncan's box. Thank you!

## 2023-09-17 NOTE — Telephone Encounter (Signed)
Placed in your tray

## 2023-09-19 NOTE — Telephone Encounter (Signed)
I will look at the hard copy when possible, when back in clinic.  Thanks.

## 2023-09-25 ENCOUNTER — Ambulatory Visit: Payer: Medicare Other | Admitting: Family Medicine

## 2023-09-30 ENCOUNTER — Other Ambulatory Visit: Payer: Self-pay | Admitting: Family Medicine

## 2023-10-01 ENCOUNTER — Ambulatory Visit: Payer: Medicare Other | Admitting: Family Medicine

## 2023-10-01 ENCOUNTER — Ambulatory Visit (INDEPENDENT_AMBULATORY_CARE_PROVIDER_SITE_OTHER)
Admission: RE | Admit: 2023-10-01 | Discharge: 2023-10-01 | Disposition: A | Discharge status: Home / Self Care | Payer: Medicare Other | Source: Ambulatory Visit | Attending: Family Medicine | Admitting: Family Medicine

## 2023-10-01 ENCOUNTER — Encounter: Payer: Self-pay | Admitting: Family Medicine

## 2023-10-01 VITALS — BP 136/80 | HR 71 | Temp 98.0°F | Ht 66.0 in | Wt 181.8 lb

## 2023-10-01 DIAGNOSIS — R202 Paresthesia of skin: Secondary | ICD-10-CM

## 2023-10-01 DIAGNOSIS — R0602 Shortness of breath: Secondary | ICD-10-CM | POA: Diagnosis not present

## 2023-10-01 DIAGNOSIS — E785 Hyperlipidemia, unspecified: Secondary | ICD-10-CM

## 2023-10-01 NOTE — Patient Instructions (Signed)
 Don't change your meds for now.  Go to the lab on the way out.   If you have mychart we'll likely use that to update you.    Take care.  Glad to see you. I'll update Dr. Alease Hunter.

## 2023-10-01 NOTE — Progress Notes (Signed)
Breathing sx.  She'll episodically have the need to take a deep breath.  She'll have leg/foot stinging at the same time, variable distance up both legs but symmetric distribution.  Doesn't happen as much when supine.  No change in sx with most recently back injection.  Sx continue from December to now.    She can have some fatigue/SOB with walking distance but her walking distance is some better recently.    She has some occ numbness in the L quad area, separate from the above.    She is off statin currently, prev on crestor 5mg  MWF from fall 2024 to 07/2023.   Discuss SOB differential dx.  She is going to see cardiology soon.    Meds, vitals, and allergies reviewed.   ROS: Per HPI unless specifically indicated in ROS section   GEN: nad, alert and oriented HEENT: mucous membranes moist NECK: supple w/o LA CV: rrr.  PULM: ctab, no inc wob, no back/leg sx with deep breath at OV.   ABD: soft, +bs EXT: trace BLE edema SKIN: well perfused.

## 2023-10-02 LAB — CBC WITH DIFFERENTIAL/PLATELET
Basophils Absolute: 0.1 10*3/uL (ref 0.0–0.1)
Basophils Relative: 1.4 % (ref 0.0–3.0)
Eosinophils Absolute: 0.2 10*3/uL (ref 0.0–0.7)
Eosinophils Relative: 2.4 % (ref 0.0–5.0)
HCT: 40.7 % (ref 36.0–46.0)
Hemoglobin: 13.7 g/dL (ref 12.0–15.0)
Lymphocytes Relative: 29.6 % (ref 12.0–46.0)
Lymphs Abs: 2.4 10*3/uL (ref 0.7–4.0)
MCHC: 33.7 g/dL (ref 30.0–36.0)
MCV: 98.1 fL (ref 78.0–100.0)
Monocytes Absolute: 0.5 10*3/uL (ref 0.1–1.0)
Monocytes Relative: 6.9 % (ref 3.0–12.0)
Neutro Abs: 4.8 10*3/uL (ref 1.4–7.7)
Neutrophils Relative %: 59.7 % (ref 43.0–77.0)
Platelets: 250 10*3/uL (ref 150.0–400.0)
RBC: 4.15 Mil/uL (ref 3.87–5.11)
RDW: 13.5 % (ref 11.5–15.5)
WBC: 8 10*3/uL (ref 4.0–10.5)

## 2023-10-02 LAB — COMPREHENSIVE METABOLIC PANEL
ALT: 10 U/L (ref 0–35)
AST: 15 U/L (ref 0–37)
Albumin: 4.2 g/dL (ref 3.5–5.2)
Alkaline Phosphatase: 79 U/L (ref 39–117)
BUN: 8 mg/dL (ref 6–23)
CO2: 32 meq/L (ref 19–32)
Calcium: 9.5 mg/dL (ref 8.4–10.5)
Chloride: 101 meq/L (ref 96–112)
Creatinine, Ser: 0.74 mg/dL (ref 0.40–1.20)
GFR: 71.3 mL/min (ref >60.00)
Glucose, Bld: 92 mg/dL (ref 70–99)
Potassium: 3.9 meq/L (ref 3.5–5.1)
Sodium: 141 meq/L (ref 135–145)
Total Bilirubin: 0.6 mg/dL (ref 0.2–1.2)
Total Protein: 6.6 g/dL (ref 6.0–8.3)

## 2023-10-02 LAB — LIPID PANEL
Cholesterol: 205 mg/dL — ABNORMAL HIGH (ref 0–200)
HDL: 56 mg/dL (ref >39.00)
LDL Cholesterol: 107 mg/dL — ABNORMAL HIGH (ref 0–99)
NonHDL: 148.77
Total CHOL/HDL Ratio: 4
Triglycerides: 211 mg/dL — ABNORMAL HIGH (ref 0.0–149.0)
VLDL: 42.2 mg/dL — ABNORMAL HIGH (ref 0.0–40.0)

## 2023-10-02 LAB — BRAIN NATRIURETIC PEPTIDE: Pro B Natriuretic peptide (BNP): 43 pg/mL (ref 0.0–100.0)

## 2023-10-02 LAB — TSH: TSH: 3.42 u[IU]/mL (ref 0.35–5.50)

## 2023-10-03 ENCOUNTER — Telehealth: Payer: Self-pay | Admitting: Family Medicine

## 2023-10-03 NOTE — Assessment & Plan Note (Signed)
She is going to f/u with cardiology.  See notes on labs.  Ctab and okay for outpatient f/u.  Ddx d/w pt.

## 2023-10-03 NOTE — Telephone Encounter (Signed)
Dr. [name]- this kind patient is still having a sensation of leg stinging in the midst of a deep breath.  Is there another target for injection in her back to try to alleviate the symptoms?  I appreciate your help.

## 2023-10-03 NOTE — Assessment & Plan Note (Signed)
With sensation of leg stinging in the midst of a deep breath.   No change in sx with most recently back injection.  I am going to ask for input from Dr. [name] about options.

## 2023-10-04 NOTE — Telephone Encounter (Signed)
Please reach out to pt to assist with scheduling.

## 2023-10-04 NOTE — Telephone Encounter (Signed)
Patient notified her PCP that she is still having numbness and tingling especially in her leg with deep breath.  Recommend that she schedule an appointment with me to plan for next steps including potential repeat injections.

## 2023-10-04 NOTE — Telephone Encounter (Signed)
Called and spoke to patient. She is scheduled to see Dr [name] on 2/27 (per patient preference).

## 2023-10-10 ENCOUNTER — Ambulatory Visit: Payer: Medicare Other | Admitting: Cardiovascular Disease

## 2023-10-18 ENCOUNTER — Ambulatory Visit (INDEPENDENT_AMBULATORY_CARE_PROVIDER_SITE_OTHER): Payer: Medicare Other

## 2023-10-18 ENCOUNTER — Ambulatory Visit: Payer: Medicare Other | Admitting: Family Medicine

## 2023-10-18 VITALS — BP 152/92 | HR 74 | Ht 66.0 in | Wt 181.0 lb

## 2023-10-18 DIAGNOSIS — M5416 Radiculopathy, lumbar region: Secondary | ICD-10-CM | POA: Diagnosis not present

## 2023-10-18 NOTE — Progress Notes (Signed)
 Rubin Payor, PhD, LAT, ATC acting as a scribe for [name], MD.  Robin Juarez is a 89 y.o. female who presents to [facility] at [facility] today for tingling in her leg. Pt was previously seen by Dr. [name] on 07/02/23 for a R distal clavicle fx. She had been seen in the past for LBP w/ radicular symptoms bilat. Last lumbar ESI 09/06/23.  Today, pt c/o bilat lower leg tingling x 2 months+. Intermittently she will have to take a deep breath, which will cause her a "stinging" pain along the distal portion of her lower leg. No relief from last ESI in January.  No weakness or numbness or significant pain.  Dx imaging: 04/25/23 L-spine XR  02/28/23 L-spine MRI  12/26/22 L-spine XR  Pertinent review of systems: No fevers or chills  Relevant historical information: Multiple medical problems.  Deconditioning.   Exam:  BP (!) 152/92   Pulse 74   Ht 5\' 6"  (1.676 m)   Wt 181 lb (82.1 kg)   SpO2 94%   BMI 29.21 kg/m  General: Well Developed, well nourished, and in no acute distress.   MSK: Legs bilaterally normal appearing Lower extremity strength is intact. Reflexes are equal and normal throughout. Negative slump test.  Unable to reproduce her symptoms with deep breathing or change in positioning or provocative testing.    Lab and Radiology Results  X-ray images lumbar spine obtained today personally and independently interpreted. The level DDD.  Anterolisthesis L5-S1.  No acute fractures are visible. Await formal radiology review  EXAM: MRI LUMBAR SPINE WITHOUT CONTRAST   TECHNIQUE: Multiplanar, multisequence MR imaging of the lumbar spine was performed. No intravenous contrast was administered.   COMPARISON:  09/03/2018   FINDINGS: Segmentation:  Standard.   Alignment: Grade 1 anterolisthesis of L5 on S1 secondary to bilateral L5 pars interarticularis defects. Mild grade 1 anterolisthesis of L4 on L5 secondary to facet disease. 2  mm retrolisthesis of L2 on L3.   Vertebrae: No acute fracture, evidence of discitis, or aggressive bone lesion.   Conus medullaris and cauda equina: Conus extends to the T12-L1 level. Conus and cauda equina appear normal.   Paraspinal and other soft tissues: No acute paraspinal abnormality. Insert into note made of cholelithiasis.   Disc levels:   Disc spaces: Degenerative disease with disc height loss at L1-2, L2-3, L3-4 and L5-S1.   T11-12: Mild broad-based disc bulge. No foraminal or central canal stenosis.   T12-L1: Broad-based disc bulge with a small central disc protrusion. Mild bilateral facet arthropathy. Mild right foraminal stenosis. No left foraminal stenosis. No spinal stenosis.   L1-L2: Broad-based disc bulge. Moderate bilateral facet arthropathy. Right subarticular recess stenosis. Moderate spinal stenosis. Severe right foraminal stenosis. Moderate left foraminal stenosis.   L2-L3: Broad-based disc osteophyte complex. Moderate bilateral facet arthropathy. Moderate spinal stenosis. Moderate right and severe left foraminal stenosis.   L3-L4: Broad-based disc osteophyte complex. Severe bilateral facet arthropathy. Severe spinal stenosis. Moderate right and severe left foraminal stenosis.   L4-L5: No disc protrusion. Ankylosis of the posterior elements bilaterally. Prior laminectomy. No foraminal or central canal stenosis.   L5-S1: Broad-based disc bulge. Prior laminectomy. Ankylosis of the posterior elements. Moderate-severe left foraminal stenosis. Severe right foraminal stenosis. No spinal stenosis.   IMPRESSION: 1. Diffuse lumbar spine spondylosis as described above. 2. No acute osseous injury of the lumbar spine. 3. Cholelithiasis.     Electronically Signed   By: Elige Ko M.D.   On:  03/08/2023 14:28 I, [name], personally (independently) visualized and performed the interpretation of the images attached in this note.     Assessment and  Plan: 89 y.o. female with lower extremity paresthesias with deep breathing.  Diagnosis is unclear at this time does include exacerbation of lumbar radiculopathy with change in positioning or abdominal pressure.  X-ray today does not show new or acute findings.  Radiology overread is still pending.  Plan for watchful waiting and if worsening consider further testing.   PDMP not reviewed this encounter. Orders Placed This Encounter  Procedures   DG Lumbar Spine 2-3 Views    Standing Status:   Future    Number of Occurrences:   1    Expiration Date:   11/15/2023    Reason for Exam (SYMPTOM  OR DIAGNOSIS REQUIRED):   low back pain    Preferred imaging location?:   [facility]   No orders of the defined types were placed in this encounter.    Discussed warning signs or symptoms. Please see discharge instructions. Patient expresses understanding.   The above documentation has been reviewed and is accurate and complete [name], M.D.

## 2023-10-18 NOTE — Patient Instructions (Addendum)
Thank you for coming in today.   Please get an Xray today before you leave   Check back as needed

## 2023-10-25 ENCOUNTER — Encounter: Payer: Self-pay | Admitting: Cardiovascular Disease

## 2023-10-25 ENCOUNTER — Ambulatory Visit: Payer: Medicare Other | Attending: Cardiovascular Disease | Admitting: Cardiovascular Disease

## 2023-10-25 VITALS — BP 146/88 | HR 79 | Ht 60.0 in | Wt 182.0 lb

## 2023-10-25 DIAGNOSIS — E782 Mixed hyperlipidemia: Secondary | ICD-10-CM

## 2023-10-25 DIAGNOSIS — I10 Essential (primary) hypertension: Secondary | ICD-10-CM

## 2023-10-25 DIAGNOSIS — I2510 Atherosclerotic heart disease of native coronary artery without angina pectoris: Secondary | ICD-10-CM

## 2023-10-25 DIAGNOSIS — I350 Nonrheumatic aortic (valve) stenosis: Secondary | ICD-10-CM | POA: Diagnosis not present

## 2023-10-25 NOTE — Patient Instructions (Signed)
 Medication Instructions:  Your physician recommends that you continue on your current medications as directed. Please refer to the Current Medication list given to you today.  *If you need a refill on your cardiac medications before your next appointment, please call your pharmacy*   Follow-Up: At [facility], you and your health needs are our priority.  As part of our continuing mission to provide you with exceptional heart care, we have created designated Provider Care Teams.  These Care Teams include your primary Cardiologist (physician) and Advanced Practice Providers (APPs -  Physician Assistants and Nurse Practitioners) who all work together to provide you with the care you need, when you need it.  We recommend signing up for the patient portal called "MyChart".  Sign up information is provided on this After Visit Summary.  MyChart is used to connect with patients for Virtual Visits (Telemedicine).  Patients are able to view lab/test results, encounter notes, upcoming appointments, etc.  Non-urgent messages can be sent to your provider as well.   To learn more about what you can do with MyChart, go to [email or web address].    Your next appointment:   6 month(s)  Provider:   Azalee Course, PA-C       Then, Nanetta Batty, MD will plan to see you again in 12 month(s).   Other Instructions   1st Floor: - Lobby - Registration  - Pharmacy  - Lab - Cafe  2nd Floor: - PV Lab - Diagnostic Testing (echo, CT, nuclear med)  3rd Floor: - Vacant  4th Floor: - TCTS (cardiothoracic surgery) - AFib Clinic - Structural Heart Clinic - Vascular Surgery  - Vascular Ultrasound  5th Floor: - HeartCare Cardiology (general and EP) - Clinical Pharmacy for coumadin, hypertension, lipid, weight-loss medications, and med management appointments    Valet parking services will be available as well.

## 2023-10-25 NOTE — Assessment & Plan Note (Signed)
 History of hyperlipidemia currently not on statin therapy with lipid profile performed 10/01/2023 revealing total cholesterol 205, LDL 107 and HDL 56.

## 2023-10-25 NOTE — Assessment & Plan Note (Signed)
 History of CAD status post cardiac cath by myself 09/03/2014 revealing noncritical CAD.  She was cathed again by Dr. Tresa Endo in 2018 revealing similar anatomy.  She denies chest pain.

## 2023-10-25 NOTE — Assessment & Plan Note (Signed)
 2D echo performed 10/30/2012 revealed normal LV systolic function, moderate asymmetric basal septal hypertrophy with mild aortic stenosis.  Valve area is 1.5 cm.  She still asymptomatic.  Given her HPI do not feel compelled to continue to survey this.

## 2023-10-25 NOTE — Assessment & Plan Note (Signed)
 Thank you very much History of Essential Hypertension Blood Pressure Measured Today at 146/88.  She Is on Metoprolol.  Her amlodipine was stopped by how making PA-C when he saw her in the office 03/05/2023.  Her blood pressures at home are within normal range.

## 2023-10-25 NOTE — Progress Notes (Signed)
 10/25/2023 Teshia Mahone Edgett   13-May-1931  [id number]  Primary Physician Joaquim Nam, MD Primary Cardiologist: Runell Gess MD Nicholes Calamity, [place]  HPI:  Robin Juarez is a 89 y.o.    moderately overweight married Caucasian female mother of 4 children, grandmother of 75 grandchildren who self-referred for evaluation of chest pain. I last saw her in the office 09/05/2022. She is accompanied by her daughter-in-law Byrd Hesselbach today.  Her primary care physician is Dr. Crawford Givens.  Her daughter Jonell Cluck is an acquaintance of mine as well.  Her cardiac risk factors are notable for family history with brother does have coronary artery bypass grafting. She probably does have hyperlipidemia and hypertension which is not treated. She began having chest pain 2 weeks ago which is left precordial and radiates to her back. There are no other associated symptoms. She also initially had strokelike symptoms and had an MRI that showed a subacute stroke in the corona radiata. She had a Myoview stress test that was low risk but that show subtle anteroapical ischemia. I performed cardiac catheterization on her 09/03/14 through the right femoral approach revealing at most 50% mid LAD stenosis after a moderate sized diagonal branch with otherwise normal coronary arteries and normal LV function. I thought her chest pain was noncardiac. Since I saw her in the office a year ago she's remained clinically stable until recently when she was admitted to [facility] on 06/13/17 for chest pain. She ruled out for myocardial infarction. Her echo was normal with grade 1 diastolic dysfunction. A CT FFR suggested disease in the IV territory which led to a cardiac catheterization by Dr. Tresa Endo the same day revealing noncritical CAD.   Since I saw her  in the office a year ago she continues to do well.  She denies chest pain or shortness of breath..   Current Meds  Medication Sig   AMBULATORY NON FORMULARY  MEDICATION Pur Wick urinary collection device.  Use at bedtime as needed.  Disp 1   aspirin EC 81 MG tablet Take 1 tablet (81 mg total) by mouth daily. Swallow whole.   Baclofen 5 MG TABS TAKE 1 TABLET BY MOUTH TWICE  DAILY AS NEEDED FOR MUSCLE  SPASMS   busPIRone (BUSPAR) 10 MG tablet as needed.   Cholecalciferol (VITAMIN D3) 125 MCG (5000 UT) CAPS Take 1 capsule (5,000 Units total) by mouth daily.   Coenzyme Q10 (CO Q 10 PO) Take 1 tablet by mouth daily.   cyanocobalamin (VITAMIN B12) 500 MCG tablet Take 1 tablet (500 mcg total) by mouth daily.   DULoxetine (CYMBALTA) 30 MG capsule Take 90 mg by mouth daily.    folic acid (FOLVITE) 800 MCG tablet Take 800 mcg by mouth daily.    furosemide (LASIX) 20 MG tablet Take 1 tablet (20 mg total) by mouth daily as needed for fluid. If AM weight is 184 lbs or higher   gabapentin (NEURONTIN) 300 MG capsule 1 tab noon, 1 tab evening, 2 tabs bedtime (4 tabs total)   KRILL OIL PO Take 100 mg by mouth daily.   lamoTRIgine (LAMICTAL) 100 MG tablet Take 1.5 tablets (150 mg total) by mouth daily after supper.   metoprolol succinate (TOPROL-XL) 25 MG 24 hr tablet TAKE 1 TABLET BY MOUTH ONCE  DAILY   OLANZapine (ZYPREXA) 2.5 MG tablet Take 1 tablet (2.5 mg total) by mouth at bedtime.     Allergies  Allergen Reactions   Digoxin Other (See  Comments)    Felt weak, tired, "awful"   Nitrofurantoin Other (See Comments)    Headache, intolerant   Alendronate Sodium Other (See Comments)    cramps   Cephalosporins     She "felt bad" while taking multiple cephalosporins but has been able tolerate amoxicillin previously.  No clear cephalosporin allergy.   Lisinopril     wheezing   Phenobarbital-Belladonna Alk Other (See Comments)   Praluent [Alirocumab]     myalgia   Repatha [Evolocumab]     myalgias   Ritalin [Methylphenidate Hcl]     intolerant   Statins     Myalgia.  Intolerant of multiple statins, including pravastatin and simvastatin.   Tetracyclines &  Related Other (See Comments)   Trimethoprim     Wheezing lip swelling   Clavulanic Acid Other (See Comments)    Not an allergy.  Tolerates plain amoxil but not augmentin- diarrhea, oral irritation.     Omeprazole Rash    rash   Oxcarbazepine Other (See Comments)    unknown   Sulfa Antibiotics Other (See Comments)    Headache, intolerant   Tetracycline Itching    itching   Zetia [Ezetimibe] Other (See Comments)    possible cause of aches and abnormal dreams, stopped 05/2014    Social History   Socioeconomic History   Marital status: Married    Spouse name: Not on file   Number of children: 4   Years of education: Not on file   Highest education level: Not on file  Occupational History   Occupation: RETIRED    Employer: RETIRED  Tobacco Use   Smoking status: Never    Passive exposure: Never   Smokeless tobacco: Never   Tobacco comments:    a small amount when 89 yo  Vaping Use   Vaping status: Never Used  Substance and Sexual Activity   Alcohol use: No    Alcohol/week: 0.0 standard drinks of alcohol   Drug use: No   Sexual activity: Not Currently  Other Topics Concern   Not on file  Social History Narrative   Married 1953, husband is well.   5 pregnancies, 4 live births, all well (5th was unexpect pregnancy and patient needed to terminate due to concurrent medical problems at the time)   11 grandchildren.  11 great grandchildren   Social Drivers of Corporate investment banker Strain: Low Risk  (01/10/2023)   Overall Financial Resource Strain (CARDIA)    Difficulty of Paying Living Expenses: Not hard at all  Food Insecurity: No Food Insecurity (01/10/2023)   Hunger Vital Sign    Worried About Running Out of Food in the Last Year: Never true    Ran Out of Food in the Last Year: Never true  Transportation Needs: No Transportation Needs (01/10/2023)   PRAPARE - Administrator, Civil Service (Medical): No    Lack of Transportation (Non-Medical): No   Physical Activity: Inactive (01/10/2023)   Exercise Vital Sign    Days of Exercise per Week: 0 days    Minutes of Exercise per Session: 0 min  Stress: No Stress Concern Present (01/10/2023)   [employer] of Occupational Health - Occupational Stress Questionnaire    Feeling of Stress : Not at all  Social Connections: Moderately Integrated (01/10/2023)   Social Connection and Isolation Panel [NHANES]    Frequency of Communication with Friends and Family: More than three times a week    Frequency of Social Gatherings with Friends and Family:  More than three times a week    Attends Religious Services: More than 4 times per year    Active Member of Clubs or Organizations: No    Attends Banker Meetings: Never    Marital Status: Married  Catering manager Violence: Not At Risk (01/10/2023)   Humiliation, Afraid, Rape, and Kick questionnaire    Fear of Current or Ex-Partner: No    Emotionally Abused: No    Physically Abused: No    Sexually Abused: No     Review of Systems: General: negative for chills, fever, night sweats or weight changes.  Cardiovascular: negative for chest pain, dyspnea on exertion, edema, orthopnea, palpitations, paroxysmal nocturnal dyspnea or shortness of breath Dermatological: negative for rash Respiratory: negative for cough or wheezing Urologic: negative for hematuria Abdominal: negative for nausea, vomiting, diarrhea, bright red blood per rectum, melena, or hematemesis Neurologic: negative for visual changes, syncope, or dizziness All other systems reviewed and are otherwise negative except as noted above.    Blood pressure (!) 146/88, pulse 79, height 5' (1.524 m), weight 182 lb (82.6 kg), SpO2 94%.  General appearance: alert and no distress Neck: no adenopathy, no carotid bruit, no JVD, supple, symmetrical, trachea midline, and thyroid not enlarged, symmetric, no tenderness/mass/nodules Lungs: clear to auscultation bilaterally Heart: Soft  outflow tract murmur consistent with aortic stenosis. Extremities: extremities normal, atraumatic, no cyanosis or edema Pulses: 2+ and symmetric Skin: Skin color, texture, turgor normal. No rashes or lesions Neurologic: Grossly normal  EKG not performed today.      ASSESSMENT AND PLAN:   HLD (hyperlipidemia) History of hyperlipidemia currently not on statin therapy with lipid profile performed 10/01/2023 revealing total cholesterol 205, LDL 107 and HDL 56.  Essential hypertension Thank you very much History of Essential Hypertension Blood Pressure Measured Today at 146/88.  She Is on Metoprolol.  Her amlodipine was stopped by how making PA-C when he saw her in the office 03/05/2023.  Her blood pressures at home are within normal range.  Coronary artery disease History of CAD status post cardiac cath by myself 09/03/2014 revealing noncritical CAD.  She was cathed again by Dr. Tresa Endo in 2018 revealing similar anatomy.  She denies chest pain.  Aortic stenosis, mild 2D echo performed 10/30/2012 revealed normal LV systolic function, moderate asymmetric basal septal hypertrophy with mild aortic stenosis.  Valve area is 1.5 cm.  She still asymptomatic.  Given her HPI do not feel compelled to continue to survey this.     Runell Gess MD FACP,FACC,FAHA, [facility] 10/25/2023 3:18 PM

## 2023-11-09 NOTE — Progress Notes (Signed)
 Low back x-ray shows arthritis changes in the base of the spine.  No broken bones are present.

## 2023-11-15 ENCOUNTER — Ambulatory Visit: Admitting: Family Medicine

## 2023-11-16 ENCOUNTER — Ambulatory Visit

## 2023-11-16 ENCOUNTER — Other Ambulatory Visit: Payer: Self-pay

## 2023-11-16 ENCOUNTER — Ambulatory Visit (INDEPENDENT_AMBULATORY_CARE_PROVIDER_SITE_OTHER): Admitting: Family Medicine

## 2023-11-16 VITALS — BP 158/92 | HR 73 | Ht 60.0 in

## 2023-11-16 DIAGNOSIS — M25551 Pain in right hip: Secondary | ICD-10-CM

## 2023-11-16 MED ORDER — PREDNISONE 50 MG PO TABS: 50.0000 mg | ORAL_TABLET | Freq: Every day | ORAL | 0 refills | Status: DC

## 2023-11-16 MED ORDER — TRAMADOL HCL 50 MG PO TABS: 50.0000 mg | ORAL_TABLET | Freq: Three times a day (TID) | ORAL | 0 refills | Status: DC | PRN

## 2023-11-16 NOTE — Progress Notes (Signed)
   Rubin Payor, PhD, LAT, ATC acting as a scribe for [name], MD.  Robin Juarez is a 89 y.o. female who presents to [facility] at [facility] today for R hip pain. Pt was previously seen by Dr. [name] on 10/18/23 for lumbar radiculopathy.  Today, pt c/o R hip pain x 1-wk. No falls or injury. She has increased her activity, which may have exacerbated. Pt locates pain to in her 1 groin. Her daughter is concerned about a blood clot. Pain is disturbing her sleep.  Radiates: yes- throughout R thigh Aggravates: hip flexion (like trying to put on her socks/shoes) Treatments tried: baclofen  Pertinent review of systems: No fevers or chills  Relevant historical information: Lumbar radiculopathy   Exam:  BP (!) 158/92   Pulse 73   Ht 5' (1.524 m)   SpO2 98%   BMI 35.54 kg/m  General: Well Developed, well nourished, and in no acute distress.   MSK: Right anterior hip pain and thigh pain.  Strength is intact although patient does experience pain with resisted hip flexion. Antalgic gait.    Lab and Radiology Results  X-ray images right hip obtained today personally and independently interpreted. Mild right hip DJD.  No acute fractures.  Enthesiopathy changes at greater trochanter. Await formal radiology review    Assessment and Plan: 89 y.o. female with anterior hip and thigh pain.  Typically this pain is coming from the hip joint.  Hip x-ray per my read today looks about the same as it has done in the past.  She does have significant DDD and areas that could cause similar radicular pain at L2 or L3.  Plan for short course of prednisone and limited tramadol.  If not improving we could try a diagnostic and therapeutic injection into the right hip next week.  She will keep me updated.   PDMP reviewed during this encounter. Orders Placed This Encounter  Procedures   [place] LIMITED JOINT SPACE STRUCTURES LOW RIGHT(NO LINKED CHARGES)    Reason for Exam (SYMPTOM   OR DIAGNOSIS REQUIRED):   RIGHT HIP PAIN    Preferred imaging location?:   [facility]   DG HIP UNILAT W OR W/O PELVIS 2-3 VIEWS RIGHT    Standing Status:   Future    Number of Occurrences:   1    Expiration Date:   12/17/2023    Reason for Exam (SYMPTOM  OR DIAGNOSIS REQUIRED):   right hip pain    Preferred imaging location?:   [facility]   Meds ordered this encounter  Medications   traMADol (ULTRAM) 50 MG tablet    Sig: Take 1 tablet (50 mg total) by mouth every 8 (eight) hours as needed for severe pain (pain score 7-10).    Dispense:  15 tablet    Refill:  0   predniSONE (DELTASONE) 50 MG tablet    Sig: Take 1 tablet (50 mg total) by mouth daily.    Dispense:  5 tablet    Refill:  0     Discussed warning signs or symptoms. Please see discharge instructions. Patient expresses understanding.   The above documentation has been reviewed and is accurate and complete [name], M.D.

## 2023-11-16 NOTE — Patient Instructions (Addendum)
 Thank you for coming in today.   Please get an Xray today before you leave   Prescriptions have been sent to your pharmacy  If not better, we will see you back next week

## 2023-11-21 ENCOUNTER — Telehealth: Payer: Self-pay | Admitting: Family Medicine

## 2023-11-21 NOTE — Telephone Encounter (Signed)
 Called patient.  She will schedule with [place] once confirming time with her daughter.

## 2023-11-21 NOTE — Telephone Encounter (Signed)
 Scheduled for 4/4 at 9:15.

## 2023-11-21 NOTE — Telephone Encounter (Signed)
 Patient called and stated that she finished the prednisone yesterday. Patient states that she still has soreness in the groin and she feels bad, she does not feel good at all.  Please Advise

## 2023-11-23 ENCOUNTER — Other Ambulatory Visit: Payer: Self-pay

## 2023-11-23 ENCOUNTER — Encounter: Payer: Self-pay | Admitting: Family Medicine

## 2023-11-23 ENCOUNTER — Ambulatory Visit: Admitting: Family Medicine

## 2023-11-23 VITALS — BP 124/82 | HR 80 | Ht 60.0 in | Wt 117.0 lb

## 2023-11-23 DIAGNOSIS — M25551 Pain in right hip: Secondary | ICD-10-CM | POA: Diagnosis not present

## 2023-11-23 NOTE — Patient Instructions (Addendum)
 Thank you for coming in today.   You received an injection today. Seek immediate medical attention if the joint becomes red, extremely painful, or is oozing fluid.   If not better, let me know and I will order a back injection.  Use the tramadol sparingly

## 2023-11-23 NOTE — Progress Notes (Signed)
   I, Stevenson Clinch, CMA acting as a scribe for [name], MD.  Robin Juarez is a 89 y.o. female who presents to [facility] today for cont'd R hip pain. Pt was last seen by Dr. [name] on 11/16/23 and was prescribed prednisone, tramadol.  Today, pt reports continued right hip pain over the past week. Notes severe pain into the groin, hip, and leg. Sx improved while o oral Prednisone but have started to come back since completing Prednisone. Denies n/t in the leg or foot.   Dx imaging: 11/16/23 R hip XR  Pertinent review of systems: No fevers or chills  Relevant historical information: Coronary artery disease   Exam:  BP 124/82   Pulse 80   Ht 5' (1.524 m)   Wt 117 lb (53.1 kg)   SpO2 94%   BMI 22.85 kg/m  General: Well Developed, well nourished, and in no acute distress.   MSK: Right hip normal-appearing pain with hip flexion.  We do strength hip flexion.    Lab and Radiology Results  Procedure: Real-time Ultrasound Guided Injection of the right hip from acetabular joint anterior approach Device: Philips Affiniti 50G/GE Logiq Images permanently stored and available for review in PACS Verbal informed consent obtained.  Discussed risks and benefits of procedure. Warned about infection, bleeding, hyperglycemia damage to structures among others. Patient expresses understanding and agreement Time-out conducted.   Noted no overlying erythema, induration, or other signs of local infection.   Skin prepped in a sterile fashion.   Local anesthesia: Topical Ethyl chloride.   With sterile technique and under real time ultrasound guidance: 40 mg of Kenalog and 2 mL of Marcaine injected into hip joint. Fluid seen entering the joint capsule.   Completed without difficulty   Pain moderately resolved suggesting accurate placement of the medication.   Advised to call if fevers/chills, erythema, induration, drainage, or persistent bleeding.   Images  permanently stored and available for review in the ultrasound unit.  Impression: Technically successful ultrasound guided injection.         Assessment and Plan: 89 y.o. female with right hip pain etiology a bit unclear.  Plan for intra-articular injection.  He had moderate improvement in pain immediately following the injection indicating that the list is at least part of her pain condition. If not much better after the steroid injection consider epidural steroid injection.  This could be lumbar radiculopathy at right L2 or L3.  PDMP reviewed during this encounter. Orders Placed This Encounter  Procedures   [place] LIMITED JOINT SPACE STRUCTURES LOW RIGHT(NO LINKED CHARGES)    Reason for Exam (SYMPTOM  OR DIAGNOSIS REQUIRED):   right hip pain    Preferred imaging location?:   [facility]   No orders of the defined types were placed in this encounter.    Discussed warning signs or symptoms. Please see discharge instructions. Patient expresses understanding.   The above documentation has been reviewed and is accurate and complete [name], M.D.

## 2023-11-26 NOTE — Progress Notes (Signed)
 Right hip x-ray shows arthritis on both sides.

## 2023-12-25 ENCOUNTER — Other Ambulatory Visit: Payer: Self-pay | Admitting: Family Medicine

## 2023-12-25 NOTE — Telephone Encounter (Unsigned)
 Copied from CRM [id number]. Topic: Clinical - Prescription Issue >> Dec 25, 2023 11:44 AM Luane Rumps D wrote: Reason for CRM: Refill request for Baclofen  5 MG TABS, patient would like to advise that hers mistakenly got thrown away and she needs it refilled asap.

## 2024-01-07 ENCOUNTER — Telehealth: Payer: Self-pay | Admitting: Cardiovascular Disease

## 2024-01-07 NOTE — Telephone Encounter (Signed)
 Patient identification verified by 2 forms. Robin Duck, RN     Called and spoke to patient  Patient states:  - She has been having high blood pressures.  - 182/88 - earlier today (she didn't have any other readings)  171/90 - 20 minutes after medication (while on the phone)  - Typically her Bps are systolic 140s/  - Does not check her BP daily but feeling unwell so she checked her BP.  - Stated Dr. Katheryne Pane instructed her to take an additional dose of metoprolol  for hypertension. After taking 50 mg this morning  latest BP was 164/79  Patient denies:  - SOB, blurred vision, headache, one- sided muscle weakness              Interventions/Plan: - Patient scheduled for BP check appt.  - Encounter forwarded to primary cardiologist for review/recommendations.   Reviewed ED warning signs/precautions  Patient agrees with plan, no questions at this time

## 2024-01-07 NOTE — Telephone Encounter (Signed)
 Pt c/o BP issue: STAT if pt c/o blurred vision, one-sided weakness or slurred speech.  STAT if BP is GREATER than 180/120 TODAY.  STAT if BP is LESS than 90/60 and SYMPTOMATIC TODAY  1. What is your BP concern? Pt states she is concerned her bp was high   2. Have you taken any BP medication today? She states Dr. Katheryne Pane told her it was okay to take Metoprolol  twice if her bp was high so she took it twice today   3. What are your last 5 BP readings?   182/88 - earlier today (she didn't have any other readings)  171/90 - 20 minutes after medication (while on the phone)   4. Are you having any other symptoms (ex. Dizziness, headache, blurred vision, passed out)? No

## 2024-01-11 ENCOUNTER — Telehealth: Payer: Self-pay | Admitting: Family Medicine

## 2024-01-11 NOTE — Telephone Encounter (Signed)
 D/w pt's husband at his OV re: her BP.  She is taking metoprolol .  Lower ie controlled BP later in the day but slightly elevated in the AMs.  Would still continue as is, to limit risk of low BPs.  Wouldn't change meds at this point.

## 2024-01-15 ENCOUNTER — Telehealth: Payer: Self-pay | Admitting: Cardiovascular Disease

## 2024-01-15 ENCOUNTER — Ambulatory Visit (INDEPENDENT_AMBULATORY_CARE_PROVIDER_SITE_OTHER): Payer: Medicare Other

## 2024-01-15 VITALS — Ht 60.0 in | Wt 182.0 lb

## 2024-01-15 DIAGNOSIS — Z0000 Encounter for general adult medical examination without abnormal findings: Secondary | ICD-10-CM | POA: Diagnosis not present

## 2024-01-15 NOTE — Telephone Encounter (Signed)
 Patient reports elevated BP over the last week. She is unsure how long BP has been elevated. Amlodipine  was discontinues July 2024 by Dr. Katheryne Pane as patient was taking amlodipine  1.25mg  daily, and at that time her BP was too low.  Recent BP readings: 5/27 164/98 80 this morning 5/26 5pm 164/82  5/26 6:30pm 155/85 5/26 138/77 after taking the amlodipine   5/25 8am 174/93 5/23 174/93 5/22 141/78  Patient states she has starting taking amlodipine  1.25 mg again (0.5 tablet of 2.5 mg).  She would like to know if she should continue on this dose. Patient has appt scheduled with Palmer Bobo, NP on 6/24.  Message sent to Dr. Micael Adas (DOD) to review and advise, as Dr. Katheryne Pane is out on vacation this week. Will also forward message to Dr. Berry Bristol who is covering Dr. Arlester Ladd basket to review and advise.

## 2024-01-15 NOTE — Telephone Encounter (Signed)
I am good with that

## 2024-01-15 NOTE — Telephone Encounter (Signed)
 Per Dr. Berry Bristol, OK to continue amlodipine  1.25 mg daily. Instructed patient to continue to monitor BP daily over the next 1-2 weeks and report readings to our office.  Patient verbalized understanding and expressed appreciation  for follow-up.

## 2024-01-15 NOTE — Progress Notes (Signed)
 Subjective:   Robin Juarez is a 89 y.o. who presents for a Medicare Wellness preventive visit.  As a reminder, Annual Wellness Visits don't include a physical exam, and some assessments may be limited, especially if this visit is performed virtually. We may recommend an in-person follow-up visit with your provider if needed.  Visit Complete: Virtual I connected with  Robin Juarez on 01/15/24 by a audio enabled telemedicine application and verified that I am speaking with the correct person using two identifiers.  Patient Location: Home  Provider Location: Home Office  I discussed the limitations of evaluation and management by telemedicine. The patient expressed understanding and agreed to proceed.  Vital Signs: Because this visit was a virtual/telehealth visit, some criteria may be missing or patient reported. Any vitals not documented were not able to be obtained and vitals that have been documented are patient reported.  VideoDeclined- This patient declined Librarian, academic. Therefore the visit was completed with audio only.  Persons Participating in Visit: Patient.  AWV Questionnaire: No: Patient Medicare AWV questionnaire was not completed prior to this visit.  Cardiac Risk Factors include: advanced age (>51men, >57 women);hypertension;dyslipidemia;obesity (BMI >30kg/m2);sedentary lifestyle     Objective:     Today's Vitals   01/15/24 1134  Weight: 182 lb (82.6 kg)  Height: 5' (1.524 m)   Body mass index is 35.54 kg/m.     01/15/2024   11:46 AM 07/26/2023    1:37 AM 01/10/2023   12:09 PM 12/28/2021    1:09 PM 10/17/2021    7:17 PM 09/05/2021    7:14 PM 09/03/2021    5:24 AM  Advanced Directives  Does Patient Have a Medical Advance Directive? Yes No;Yes Yes No No Yes Yes  Type of Estate agent of [place];Living will Healthcare Power of [place];Living will Healthcare Power of [place];Living will    Healthcare Power of [employer] of [place];Living will  Does patient want to make changes to medical advance directive?      No - Patient declined   Copy of Healthcare Power of Attorney in Chart? No - copy requested  No - copy requested   No - copy requested No - copy requested  Would patient like information on creating a medical advance directive?    No - Patient declined No - Patient declined      Current Medications (verified) Outpatient Encounter Medications as of 01/15/2024  Medication Sig   AMBULATORY NON FORMULARY MEDICATION Pur Leodis Rainwater urinary collection device.  Use at bedtime as needed.  Disp 1   aspirin  EC 81 MG tablet Take 1 tablet (81 mg total) by mouth daily. Swallow whole.   Baclofen  5 MG TABS TAKE 1 TABLET BY MOUTH TWICE  DAILY AS NEEDED FOR MUSCLE  SPASMS   busPIRone (BUSPAR) 10 MG tablet as needed.   Cholecalciferol  (VITAMIN D3) 125 MCG (5000 UT) CAPS Take 1 capsule (5,000 Units total) by mouth daily.   Coenzyme Q10 (CO Q 10 PO) Take 1 tablet by mouth daily.   cyanocobalamin  (VITAMIN B12) 500 MCG tablet Take 1 tablet (500 mcg total) by mouth daily.   DULoxetine  (CYMBALTA ) 30 MG capsule Take 90 mg by mouth daily.    folic acid  (FOLVITE ) 800 MCG tablet Take 800 mcg by mouth daily.    furosemide  (LASIX ) 20 MG tablet Take 1 tablet (20 mg total) by mouth daily as needed for fluid. If AM weight is 184 lbs or higher   gabapentin  (NEURONTIN ) 300  MG capsule 1 tab noon, 1 tab evening, 2 tabs bedtime (4 tabs total)   KRILL OIL PO Take 100 mg by mouth daily.   lamoTRIgine  (LAMICTAL ) 100 MG tablet Take 1.5 tablets (150 mg total) by mouth daily after supper.   metoprolol  succinate (TOPROL -XL) 25 MG 24 hr tablet TAKE 1 TABLET BY MOUTH ONCE  DAILY   OLANZapine  (ZYPREXA ) 2.5 MG tablet Take 1 tablet (2.5 mg total) by mouth at bedtime.   traMADol  (ULTRAM ) 50 MG tablet Take 1 tablet (50 mg total) by mouth every 8 (eight) hours as needed for severe pain (pain score 7-10).    predniSONE  (DELTASONE ) 50 MG tablet Take 1 tablet (50 mg total) by mouth daily. (Patient not taking: Reported on 01/15/2024)   No facility-administered encounter medications on file as of 01/15/2024.    Allergies (verified) Digoxin, Nitrofurantoin, Alendronate sodium, Cephalosporins, Lisinopril , Phenobarbital-belladonna alk, Praluent  [alirocumab ], Repatha  [evolocumab ], Ritalin [methylphenidate hcl], Statins, Tetracyclines & related, Trimethoprim, Clavulanic acid, Omeprazole, Oxcarbazepine, Sulfa antibiotics, Tetracycline, and Zetia  [ezetimibe ]   History: Past Medical History:  Diagnosis Date   Arthritis    knee and hip OA- prev injection by Dr. Rexanne Catalina   Blood transfusion without reported diagnosis    37 yrs ago -s/p back surgery   Chest pain    Coronary artery disease    50% mid LAD by cath January 2016   Depression 1993, 2000   Fibromyalgia    GERD (gastroesophageal reflux disease)    H/O hiatal hernia    Hyperlipidemia    Hypertension    OSA (obstructive sleep apnea)    off tx as of 2020, no sx as of 2020   Osteoporosis    Positive cardiac stress test    Stroke [facility])    Urinary incontinence    UTI (lower urinary tract infection)    Past Surgical History:  Procedure Laterality Date   ABDOMINAL HYSTERECTOMY  08/21/1974   APPENDECTOMY  08/21/1962   Back fusions  08/22/1975   missing vertebra   BACK SURGERY     fusions lumbar area   BILATERAL CARPAL TUNNEL RELEASE Bilateral 10/2022   BREAST BIOPSY  08/21/1993   x 2   BREAST BIOPSY  07/10/2012   Procedure: BREAST BIOPSY WITH NEEDLE LOCALIZATION;  Surgeon: Enid Harry, MD;  Location: [facility];  Service: General;  Laterality: Right;   CARDIOVASCULAR STRESS TEST  08/22/2007   Normal, Dr. Micael Adas   CARPAL TUNNEL RELEASE Bilateral 08/2022   CATARACT EXTRACTION, BILATERAL Bilateral 08/22/2003   Childbirth  1952,1956,1957,1960   CYSTOSCOPY N/A 11/25/2012   Procedure: CYSTOSCOPY;  Surgeon: Devorah Fonder, MD;  Location: [facility];  Service: Urology;  Laterality: N/A;   dilatation and currettage  08/21/1962   LEFT HEART CATH AND CORONARY ANGIOGRAPHY N/A 06/15/2017   Procedure: LEFT HEART CATH AND CORONARY ANGIOGRAPHY;  Surgeon: Millicent Ally, MD;  Location: [facility];  Service: Cardiovascular;  Laterality: N/A;   LEFT HEART CATHETERIZATION WITH CORONARY ANGIOGRAM N/A 09/03/2014   Procedure: LEFT HEART CATHETERIZATION WITH CORONARY ANGIOGRAM;  Surgeon: Avanell Leigh, MD;  Location: [facility];  Service: Cardiovascular;  Laterality: N/A;   LUMBAR LAMINECTOMY/DECOMPRESSION MICRODISCECTOMY N/A 05/01/2013   Procedure: COMPLETE DECOMPRESSIVE LUMBAR LAMINECTOMY L3-L4 MICRODISCECTOMY L3-L4 ON THE LEFT;  Surgeon: Florencia Hunter, MD;  Location: [facility];  Service: Orthopedics;  Laterality: N/A;   OVARIAN CYST REMOVAL  08/21/1962   PUBOVAGINAL SLING N/A 11/25/2012   Procedure: Gino Lais;  Surgeon: Devorah Fonder,  MD;  Location: [facility];  Service: Urology;  Laterality: N/A;   Family History  Problem Relation Age of Onset   Arthritis Mother    Stroke Father    Hypertension Father    Hypertension Sister    Dementia Sister    Aneurysm Sister        Brain   Parkinsonism Sister    Heart disease Brother        Died during CABG   Kidney disease Maternal Grandmother    Colon cancer Neg Hx    Breast cancer Neg Hx    Social History   Socioeconomic History   Marital status: Married    Spouse name: Not on file   Number of children: 4   Years of education: Not on file   Highest education level: Not on file  Occupational History   Occupation: RETIRED    Employer: RETIRED  Tobacco Use   Smoking status: Never    Passive exposure: Never   Smokeless tobacco: Never   Tobacco comments:    a small amount when 89 yo  Vaping Use   Vaping status: Never Used  Substance and Sexual Activity   Alcohol use: No    Alcohol/week: 0.0  standard drinks of alcohol   Drug use: No   Sexual activity: Not Currently  Other Topics Concern   Not on file  Social History Narrative   Married 1953, husband is well.   5 pregnancies, 4 live births, all well (5th was unexpect pregnancy and patient needed to terminate due to concurrent medical problems at the time)   11 grandchildren.  11 great grandchildren   Social Drivers of Corporate investment banker Strain: Low Risk  (01/15/2024)   Overall Financial Resource Strain (CARDIA)    Difficulty of Paying Living Expenses: Not very hard  Food Insecurity: No Food Insecurity (01/15/2024)   Hunger Vital Sign    Worried About Running Out of Food in the Last Year: Never true    Ran Out of Food in the Last Year: Never true  Transportation Needs: No Transportation Needs (01/15/2024)   PRAPARE - Administrator, Civil Service (Medical): No    Lack of Transportation (Non-Medical): No  Physical Activity: Inactive (01/15/2024)   Exercise Vital Sign    Days of Exercise per Week: 0 days    Minutes of Exercise per Session: 0 min  Stress: No Stress Concern Present (01/15/2024)   [employer] of Occupational Health - Occupational Stress Questionnaire    Feeling of Stress : Not at all  Social Connections: Socially Integrated (01/15/2024)   Social Connection and Isolation Panel [NHANES]    Frequency of Communication with Friends and Family: More than three times a week    Frequency of Social Gatherings with Friends and Family: More than three times a week    Attends Religious Services: More than 4 times per year    Active Member of [employer] or Organizations: Yes    Attends Engineer, structural: More than 4 times per year    Marital Status: Married    Tobacco Counseling Counseling given: Not Answered Tobacco comments: a small amount when 89 yo    Clinical Intake:  Pre-visit preparation completed: Yes  Pain : No/denies pain     BMI - recorded: 35.54 Nutritional  Status: BMI > 30  Obese Nutritional Risks: None Diabetes: No  Lab Results  Component Value Date   HGBA1C 5.1 05/27/2009     How  often do you need to have someone help you when you read instructions, pamphlets, or other written materials from your doctor or pharmacy?: 1 - Never  Interpreter Needed?: No  Comments: lives with husband Information entered by :: B.Baudelia Schroepfer,LPN   Activities of Daily Living     01/15/2024   11:46 AM  In your present state of health, do you have any difficulty performing the following activities:  Hearing? 1  Vision? 0  Difficulty concentrating or making decisions? 1  Walking or climbing stairs? 0  Dressing or bathing? 0  Doing errands, shopping? 1  Preparing Food and eating ? N  Using the Toilet? N  In the past six months, have you accidently leaked urine? Y  Do you have problems with loss of bowel control? N  Managing your Medications? N  Managing your Finances? N  Housekeeping or managing your Housekeeping? Y    Patient Care Team: Donnie Galea, MD as PCP - General Katheryne Pane Frederico Jan, MD as PCP - Cardiology (Cardiology) Sherree Doctor, MD as Consulting Physician (Psychiatry) Avanell Leigh, MD as Consulting Physician (Cardiology) Sherron Dom, MD as Consulting Physician (Obstetrics and Gynecology) Bridgette Campus as Referring Physician (Optometry) Rosaria Common, DDS as Referring Physician (Dentistry)  Indicate any recent Medical Services you may have received from other than [facility] providers in the past year (date may be approximate).     Assessment:    This is a routine wellness examination for Robin Juarez.  Hearing/Vision screen Hearing Screening - Comments:: Pt says her hearing is fair Vision Screening - Comments:: Pt says her vision is good w/glasses Dr Dingeldein;[facility] one month ago   Goals Addressed             This Visit's Progress    DIET - EAT MORE FRUITS AND VEGETABLES   On track    01/15/24     Patient  Stated   On track    01/15/24- I will maintain and continue medications as prescribed.        Depression Screen     01/15/2024   11:42 AM 04/05/2023    2:06 PM 02/19/2023    3:22 PM 01/10/2023   12:08 PM 10/19/2022    2:45 PM 12/28/2021    1:06 PM 05/18/2020    3:38 PM  PHQ 2/9 Scores  PHQ - 2 Score 0 2 2 0 3 1 0  PHQ- 9 Score  4 4  8   0    Fall Risk     01/15/2024   11:38 AM 04/05/2023    2:06 PM 02/19/2023    3:21 PM 01/10/2023   12:10 PM 10/19/2022    2:46 PM  Fall Risk   Falls in the past year? 1 0 0 0 0  Number falls in past yr: 0 0 0 0 0  Injury with Fall? 1 0 0 0 0  Comment broke collar bone in Auguast 2024      Risk for fall due to : No Fall Risks No Fall Risks No Fall Risks No Fall Risks No Fall Risks  Follow up Education provided;Falls prevention discussed Falls evaluation completed Falls evaluation completed Falls prevention discussed;Falls evaluation completed Falls evaluation completed    MEDICARE RISK AT HOME:  Medicare Risk at Home Any stairs in or around the home?: Yes If so, are there any without handrails?: Yes Home free of loose throw rugs in walkways, pet beds, electrical cords, etc?: Yes Adequate lighting in your home to reduce risk of falls?:  Yes Life alert?: No Use of a cane, walker or w/c?: No Grab bars in the bathroom?: Yes Shower chair or bench in shower?: Yes Elevated toilet seat or a handicapped toilet?: Yes  TIMED UP AND GO:  Was the test performed?  No  Cognitive Function: 6CIT completed    05/18/2020    3:40 PM 09/10/2017   11:28 AM  MMSE - Mini Mental State Exam  Orientation to time 5 5  Orientation to Place 5 5  Registration 3 3  Attention/ Calculation 5 0  Recall 3 3  Language- name 2 objects  0  Language- repeat 1 1  Language- follow 3 step command  3  Language- read & follow direction  0  Write a sentence  0  Copy design  0  Total score  20        01/15/2024   11:51 AM 01/10/2023   12:11 PM 12/28/2021    1:12 PM  6CIT  Screen  What Year? 0 points 0 points 0 points  What month? 0 points 0 points 0 points  What time? 0 points 0 points 0 points  Count back from 20 0 points 0 points 0 points  Months in reverse 0 points 0 points 0 points  Repeat phrase 8 points 4 points 4 points  Total Score 8 points 4 points 4 points    Immunizations Immunization History  Administered Date(s) Administered   Fluad Quad(high Dose 65+) 05/22/2019, 06/02/2020, 06/07/2022   Fluad Trivalent(High Dose 65+) 06/25/2023   Influenza Split 05/27/2009, 06/01/2011, 05/03/2012   Influenza Whole 06/15/2010   Influenza, High Dose Seasonal PF 06/07/2021   Influenza,inj,Quad PF,6+ Mos 06/10/2013, 06/18/2014, 05/25/2015, 05/23/2016, 06/06/2017, 06/13/2018   Influenza,inj,quad, With Preservative 05/22/2019   Moderna Sars-Covid-2 Vaccination 11/03/2019, 11/24/2019   PFIZER(Purple Top)SARS-COV-2 Vaccination 11/03/2019, 11/26/2019, 06/24/2020   Pfizer Covid-19 Vaccine Bivalent Booster 48yrs & up 05/02/2021   Pneumococcal Conjugate-13 11/17/2014   Pneumococcal Polysaccharide-23 08/21/1998, 05/02/1999   Rsv, Bivalent, Protein Subunit Rsvpref,pf Pattricia Bores) 06/04/2023   Td 08/22/2003, 07/29/2004   Tdap 04/21/2014   Zoster Recombinant(Shingrix ) 05/01/2019, 07/29/2019   Zoster, Live 08/06/2006, 08/22/2007    Screening Tests Health Maintenance  Topic Date Due   MAMMOGRAM  07/20/2021   COVID-19 Vaccine (9 - 2024-25 season) 04/22/2023   INFLUENZA VACCINE  03/21/2024   DTaP/Tdap/Td (4 - Td or Tdap) 04/21/2024   Medicare Annual Wellness (AWV)  01/14/2025   Pneumonia Vaccine 25+ Years old  Completed   DEXA SCAN  Completed   Zoster Vaccines- Shingrix   Completed   HPV VACCINES  Aged Out   Meningococcal B Vaccine  Aged Out    Health Maintenance  Health Maintenance Due  Topic Date Due   MAMMOGRAM  07/20/2021   COVID-19 Vaccine (9 - 2024-25 season) 04/22/2023   Health Maintenance Items Addressed: None needed   Additional  Screening:  Vision Screening: Recommended annual ophthalmology exams for early detection of glaucoma and other disorders of the eye.  Dental Screening: Recommended annual dental exams for proper oral hygiene  Community Resource Referral / Chronic Care Management: CRR required this visit?  No   CCM required this visit?  Appt scheduled with PCP   Plan:    I have personally reviewed and noted the following in the patient's chart:   Medical and social history Use of alcohol, tobacco or illicit drugs  Current medications and supplements including opioid prescriptions. Patient is currently taking opioid prescriptions. Information provided to patient regarding non-opioid alternatives. Patient advised to discuss non-opioid treatment  plan with their provider. Functional ability and status Nutritional status Physical activity Advanced directives List of other physicians Hospitalizations, surgeries, and ER visits in previous 12 months Vitals Screenings to include cognitive, depression, and falls Referrals and appointments  In addition, I have reviewed and discussed with patient certain preventive protocols, quality metrics, and best practice recommendations. A written personalized care plan for preventive services as well as general preventive health recommendations were provided to patient.   Nerissa Bannister, LPN   11/27/8117   After Visit Summary: (Declined) Due to this being a telephonic visit, with patients personalized plan was offered to patient but patient Declined AVS at this time   Notes: Nothing significant to report at this time.

## 2024-01-15 NOTE — Telephone Encounter (Signed)
 Pt c/o BP issue: STAT if pt c/o blurred vision, one-sided weakness or slurred speech.  STAT if BP is GREATER than 180/120 TODAY.  STAT if BP is LESS than 90/60 and SYMPTOMATIC TODAY  1. What is your BP concern? High blood pressure  2. Have you taken any BP medication today?yes  3. What are your last 5 BP readings? 5/27 164/98 80 this morning 5/26 5pm 164/82  5/26 6:30pm 155/85 5/26 138/77 after taking the amlodipine   5/25 8am 174/93 5/23 174/93 5/22 141/78  4. Are you having any other symptoms (ex. Dizziness, headache, blurred vision, passed out)? Patient states she had a little bit of a headache yesterday after breakfast, and just hasn't been feeling well.  She states she took 1/2 of amlodipine  yesterday afternoon and that seemed to help.

## 2024-01-15 NOTE — Patient Instructions (Addendum)
 Robin Juarez , Thank you for taking time out of your busy schedule to complete your Annual Wellness Visit with me. I enjoyed our conversation and look forward to speaking with you again next year. I, as well as your care team,  appreciate your ongoing commitment to your health goals. Please review the following plan we discussed and let me know if I can assist you in the future. Your Game plan/ To Do List    Follow up Visits: Next Medicare AWV with our clinical staff: 01/15/25 @ 11:30am televisit   Have you seen your provider in the last 6 months (3 months if uncontrolled diabetes)? Yes Next Office Visit with your provider: 04/11/24 CPE  Clinician Recommendations:  Aim for 30 minutes of exercise or brisk walking, 6-8 glasses of water , and 5 servings of fruits and vegetables each day.       This is a list of the screening recommended for you and due dates:  Health Maintenance  Topic Date Due   Mammogram  07/20/2021   COVID-19 Vaccine (9 - 2024-25 season) 04/22/2023   Flu Shot  03/21/2024   DTaP/Tdap/Td vaccine (4 - Td or Tdap) 04/21/2024   Medicare Annual Wellness Visit  01/14/2025   Pneumonia Vaccine  Completed   DEXA scan (bone density measurement)  Completed   Zoster (Shingles) Vaccine  Completed   HPV Vaccine  Aged Out   Meningitis B Vaccine  Aged Out    Advanced directives: (Copy Requested) Please bring a copy of your health care power of attorney and living will to the office to be added to your chart at your convenience. You can mail to [facility] [address] [place], [place] [address] or email to [email or web address] Advance Care Planning is important because it:  [x]  Makes sure you receive the medical care that is consistent with your values, goals, and preferences  [x]  It provides guidance to your family and loved ones and reduces their decisional burden about whether or not they are making the right decisions based on your wishes.  Follow the  link provided in your after visit summary or read over the paperwork we have mailed to you to help you started getting your Advance Directives in place. If you need assistance in completing these, please reach out to us  so that we can help you!

## 2024-01-16 ENCOUNTER — Telehealth: Payer: Self-pay | Admitting: Family Medicine

## 2024-01-16 NOTE — Telephone Encounter (Signed)
 Please see about getting patient set up for OV re: memory testing.  She had changes on the screening with the AMW visit.  Thanks.

## 2024-01-17 ENCOUNTER — Ambulatory Visit: Payer: Self-pay

## 2024-01-17 DIAGNOSIS — R1310 Dysphagia, unspecified: Secondary | ICD-10-CM

## 2024-01-17 NOTE — Telephone Encounter (Signed)
 If sx are better now, then we can check her here in clinic then see about swallowing study.  Other options- we can refer to ENT.  Let me know which way she wants to go.  Thanks.

## 2024-01-17 NOTE — Telephone Encounter (Signed)
  Chief Complaint: difficulty swallowing Symptoms: difficulty swallowing some pills Frequency: 3-4 times over the past month Pertinent Negatives: Patient denies difficulty breathing or oral sores Disposition: [] ED /[] Urgent Care (no appt availability in office) / [x] Appointment(In office/virtual)/ []  [facility]/ [] Home Care/ [] Refused Recommended Disposition /[] [place] [facility]/ []  Follow-up with PCP Additional Notes: difficulty swallowing pills, states that she felt like she was going to strangle on the water  while taking her noon aspirin .  She states that this has happened three of four times before. States that she can feel something in the left side of her throat that is not supposed to be there and that it feels scratchy.  She called EMS today when she was having trouble swallowing, but the issue resolved before they arrived.  Encouraged to call 911 or Go to ER if swallowing becomes more difficult or occurs more frequently. She verbalizes understanding Copied from CRM [id number]. Topic: Clinical - Red Word Triage >> Jan 17, 2024  2:02 PM Chuck Crater wrote: Reason for CRM: Patient is requesting a callback from Dr. Vallarie Gauze. She had EMT here before lunch and she was taking her meds and couldn't get 81 aspirin  down . She was afraid she was going to strangle herself on the water . Patient states that something is going happening on the left side of her throat but it is slowly going down. Patient wants to know if she has to go to a ENT doctor or not. She states this has happened 3-4 times before. Reason for Disposition  Swallowing difficulty is a chronic symptom (recurrent or ongoing AND present > 4 weeks)  Answer Assessment - Initial Assessment Questions 1. DESCRIPTION: "Tell me more about this problem." "Are you  having trouble swallowing liquids, solids, or both?" "Any trouble with swallowing saliva (spit)?"     Difficulty swallowing noon aspirin  2. SEVERITY: "How bad is the  swallowing difficulty?"  (e.g., Scale 1-10; or mild, moderate, severe)   - MILD (0-3): Occasional swallowing difficulty; has trouble swallowing certain types of foods or liquids.   - MODERATE (4-7): Frequent swallowing difficulty; only able to swallow small amounts of foods and fluids.   - SEVERE (8-10): Unable to swallow any foods, fluids, or saliva; sensation of "lump in throat" or "something stuck in throat", and frequent drooling or spitting may be present.     mild 3. ONSET: "When did the swallowing problems begin?"      This happened today with noon meds, but has happened three or four times in the past.  4. CAUSE: "What do you think is causing the problem?"  (e.g., dry mouth, food or pill stuck in throat, mouth pain, sore throat, progression of disease process such as dementia or Parkinson's disease).      unknown 5. CHRONIC or RECURRENT: "Is this a new problem for you?"  If No, ask: "How long have you had this problem?" (e.g., days, weeks, months)      Recurrent in the past six weeks when taking medicine with waterno 6. OTHER SYMPTOMS: "Do you have any other symptoms?" (e.g., chest pain, difficulty breathing, mouth sores, sore throat, swollen tongue, chest pain)     denies  Protocols used: Swallowing Difficulty-A-AH

## 2024-01-17 NOTE — Telephone Encounter (Signed)
 Called pt to schedule appt no voice mail to leave message will call pt later

## 2024-01-17 NOTE — Telephone Encounter (Signed)
Please call patient and schedule office visit.

## 2024-01-17 NOTE — Telephone Encounter (Signed)
 Patient says she is alot better. She can feel on the left side of her throat that something is going on. Yet she can eat and drink. She would rather have a referral to ENT instead of coming in to be seen here.

## 2024-01-18 NOTE — Telephone Encounter (Signed)
 Referral placed  Thanks!

## 2024-01-18 NOTE — Addendum Note (Signed)
 Addended by: Donnie Galea on: 01/18/2024 07:28 AM   Modules accepted: Orders

## 2024-01-22 ENCOUNTER — Ambulatory Visit: Admitting: Family Medicine

## 2024-01-23 ENCOUNTER — Telehealth: Payer: Self-pay | Admitting: Family Medicine

## 2024-01-23 NOTE — Telephone Encounter (Signed)
 Returned call to patient and advise that the referral has been placed but to allow 7-10 business days.

## 2024-01-23 NOTE — Telephone Encounter (Signed)
 Copied from CRM [id number]. Topic: Referral - Status >> Jan 23, 2024  1:30 PM Magdalene School wrote: Reason for CRM: patient called for ENT referral status.

## 2024-01-24 ENCOUNTER — Telehealth: Payer: Self-pay | Admitting: Cardiovascular Disease

## 2024-01-24 NOTE — Telephone Encounter (Signed)
 Pt c/o medication issue:  1. Name of Medication:   amLODipine  (NORVASC ) 2.5 MG tablet   2. How are you currently taking this medication (dosage and times per day)?   1/2 tablet at night  3. Are you having a reaction (difficulty breathing--STAT)?   4. What is your medication issue?    Patient stated she had a medication change and she is now not urinating as normal.  Patient wants a call back to discuss next steps.

## 2024-01-24 NOTE — Telephone Encounter (Signed)
 Neither is known to cause a decrease in urine output.  Both actually do occasionally increase it.  She is best to reach out to PCP for advice/follow up.

## 2024-01-24 NOTE — Telephone Encounter (Signed)
 Patient identification verified by 2 forms. Sims Duck, RN     Called and spoke to patient  Patient states:  -  noticed she was having trouble urinating this morning. when she has to urinate it is very little.  - started taking amlodipine  because she was having higher blood pressures over the last few week. Unable to provide those readings at time of call.  -  She has been taking the amlodipine  in addition to her metoprolol  this week.  - Has history of depression and currently takes Cymbalta . PCP increased it to the max dose this past week.  - Unsure if the change to her Cymbalta  medication or the added amlodipine  is causing her trouble urinating.   Patient denies:  - pain when urinating, cramping, fever, discolored urine, n/v, dizziness, vision changes, SOB, chest pain, headache.              Interventions/Plan: - Patient to follow up with PCP regarding Cymbalta  changes.  - Encounter forwarded to primary cardiologist and pharmacy team for review/recommendations.   Reviewed ED warning signs/precautions  Patient agrees with plan, no questions at this time

## 2024-01-31 NOTE — Telephone Encounter (Signed)
 Patient identification verified by 2 forms. Sims Duck, RN     Called and spoke to patient  Relayed provider recommendations  Patient aware:  Alvstad, Kristin L, RPH-CPP to Me    01/24/24  4:26 PM Note Neither is known to cause a decrease in urine output.  Both actually do occasionally increase it.  She is best to reach out to PCP for advice/follow up.       Patient will follow up with PCP. Patient verbalized understanding, no questions at this time .

## 2024-02-04 ENCOUNTER — Ambulatory Visit: Admitting: Family Medicine

## 2024-02-04 ENCOUNTER — Encounter: Payer: Self-pay | Admitting: Family Medicine

## 2024-02-04 VITALS — BP 128/64 | HR 75 | Temp 98.4°F | Ht 60.0 in | Wt 186.4 lb

## 2024-02-04 DIAGNOSIS — R413 Other amnesia: Secondary | ICD-10-CM | POA: Diagnosis not present

## 2024-02-04 DIAGNOSIS — R1310 Dysphagia, unspecified: Secondary | ICD-10-CM

## 2024-02-04 DIAGNOSIS — K649 Unspecified hemorrhoids: Secondary | ICD-10-CM

## 2024-02-04 NOTE — Progress Notes (Unsigned)
 D/w pt about ENT referral regarding pill dysphagia and message was sent today re: status.  She isn't having symptoms most of the time.  Voice is at baseline.    Memory d/w pt, recheck today.  She has noted some occ memory changes with short term changes.   Some occ word searching.  Gradual change noted by patient.  No red flag events.  D/w pt about hemorrhoids vs something that she can feel on external rectal exam.  Some local irritation.  Had used prep H prior, intermittently.  No blood in stool. She prev had smaller stools.  Recent restarted using hemorrhoid creams.  She felt something on the L side on self exam.    Meds, vitals, and allergies reviewed.   ROS: Per HPI unless specifically indicated in ROS section   Nad Ncat Neck supple, no LA Rrr Ctab Abd soft, not ttp MMSE 29/30, -1 for recall Chaperoned exam with ext hemorrhoid noted.   32 minutes were devoted to patient care in this encounter (this includes time spent reviewing the patient's file/history, interviewing and examining the patient, counseling/reviewing plan with patient).

## 2024-02-04 NOTE — Patient Instructions (Signed)
 I would try using preparation H and you should get a call about seeing GI.  Take care.  Glad to see you.

## 2024-02-04 NOTE — Telephone Encounter (Signed)
 Please follow on referral for ENT for patient. Thank you.

## 2024-02-05 ENCOUNTER — Other Ambulatory Visit: Payer: Self-pay | Admitting: Cardiovascular Disease

## 2024-02-05 ENCOUNTER — Encounter: Payer: Self-pay | Admitting: Family Medicine

## 2024-02-06 ENCOUNTER — Other Ambulatory Visit: Payer: Self-pay | Admitting: Family Medicine

## 2024-02-06 DIAGNOSIS — F339 Major depressive disorder, recurrent, unspecified: Secondary | ICD-10-CM

## 2024-02-06 DIAGNOSIS — F411 Generalized anxiety disorder: Secondary | ICD-10-CM

## 2024-02-07 DIAGNOSIS — R1310 Dysphagia, unspecified: Secondary | ICD-10-CM | POA: Insufficient documentation

## 2024-02-07 DIAGNOSIS — R413 Other amnesia: Secondary | ICD-10-CM | POA: Insufficient documentation

## 2024-02-07 DIAGNOSIS — K649 Unspecified hemorrhoids: Secondary | ICD-10-CM | POA: Insufficient documentation

## 2024-02-07 NOTE — Assessment & Plan Note (Signed)
 Message sent regarding status of ENT referral.  Voice is at baseline.

## 2024-02-07 NOTE — Assessment & Plan Note (Signed)
 D/w pt about options.  She wanted to talk to GI to see about options.  Referral placed.  Can use Preparation H as needed in the meantime.

## 2024-02-07 NOTE — Assessment & Plan Note (Signed)
 MMSE 29/30 with 1 minutes for recall.  Reasonable to observe at this point.

## 2024-02-12 ENCOUNTER — Ambulatory Visit: Attending: Emergency Medicine | Admitting: Emergency Medicine

## 2024-02-12 ENCOUNTER — Encounter: Payer: Self-pay | Admitting: Emergency Medicine

## 2024-02-12 VITALS — BP 130/74 | HR 72 | Ht 60.0 in | Wt 189.0 lb

## 2024-02-12 DIAGNOSIS — I2510 Atherosclerotic heart disease of native coronary artery without angina pectoris: Secondary | ICD-10-CM

## 2024-02-12 DIAGNOSIS — I350 Nonrheumatic aortic (valve) stenosis: Secondary | ICD-10-CM

## 2024-02-12 DIAGNOSIS — E782 Mixed hyperlipidemia: Secondary | ICD-10-CM | POA: Diagnosis not present

## 2024-02-12 DIAGNOSIS — I10 Essential (primary) hypertension: Secondary | ICD-10-CM | POA: Diagnosis not present

## 2024-02-12 MED ORDER — METOPROLOL SUCCINATE ER 25 MG PO TB24: 25.0000 mg | ORAL_TABLET | Freq: Every day | ORAL | 1 refills | Status: AC

## 2024-02-12 NOTE — Patient Instructions (Signed)
 Medication Instructions:  TAKE YOUR AMLODIPINE  2.5 MG EACH MORNING. TAKE YOUR METOPROLOL  SUCCINATE 25 MG AT BEDTIME.   Lab Work: NONE   Testing/Procedures: NONE  Follow-Up: At [facility], you and your health needs are our priority.  As part of our continuing mission to provide you with exceptional heart care, our providers are all part of one team.  This team includes your primary Cardiologist (physician) and Advanced Practice Providers or APPs (Physician Assistants and Nurse Practitioners) who all work together to provide you with the care you need, when you need it.  Your next appointment:   3 MONTHS  Provider:   MADISON FOUNTAIN, DNP

## 2024-02-12 NOTE — Progress Notes (Signed)
 Cardiology Office Note:    Date:  02/12/2024  ID:  Robin Juarez, DOB 02/02/1931, MRN [id number] PCP: Cleatus Arlyss RAMAN, MD  [facility] Providers Cardiologist:  Dorn Lesches, MD       Patient Profile:       Chief Complaint: 92-month follow-up History of Present Illness:  Robin Juarez is a 89 y.o. female with visit-pertinent history of hyperlipidemia, hypertension, coronary artery disease, mild aortic stenosis  She had Myoview stress test in 2016 that was low risk but showed subtle anterior apical ischemia.  She underwent cardiac catheterization on 09/03/2014 revealing at most 50% mid LAD stenosis after a moderate size diagonal branch with otherwise normal coronary arteries and normal LV function.  She was seen in the hospital on 06/13/2017 for chest pain.  She was ruled out for myocardial infarction.  Her echo was normal with grade 1 diastolic dysfunction.   Coronary CT obtained on 06/14/2017 demonstrated 25 to 50% proximal LAD, greater than 70% mid LAD lesion, 25 to 50% left circumflex lesion.  This was followed by repeat cardiac catheterization study on 06/15/2017 that showed 20% ostial LAD, 40% proximal LAD, 50% ostial D1, 30% ostial to proximal LAD, 20% proximal to mid left circumflex lesion.  No culprit lesion was identified.  Medical therapy was recommended.     She was last seen by Dr. Lesches on 10/25/2023.  Her blood pressure was 146/88.  However her blood pressure somewhat well-controlled.  No medication changes were made she was to follow-up in 6 months.  On 5/19 and 5/27 she had called office reporting elevated blood pressures.  She began taking amlodipine  1.25 mg daily which was previously discontinued on 02/2023.   Discussed the use of AI scribe software for clinical note transcription with the patient, who gave verbal consent to proceed.  History of Present Illness Robin Juarez is a 89 year old female with hypertension who presents with elevated blood  pressure.   She is doing well today.  Arrives with her daughter.  Her blood pressure has been elevated recently, with home readings around 150s over 80s.  She resumed half of a 2.5 mg amlodipine  pill a month ago.  Her amlodipine  was previously discontinued on 02/2023.  However she did still have some pills at home.  She reports since taking the amlodipine  she does not have the spikes in her blood pressure.  She takes metoprolol  in the morning, with the highest blood pressure readings occurring before breakfast.  She denies any anginal symptoms, chest pains, dyspnea, orthopnea, PND, palpitations, syncope, presyncope.  Her leg swelling is well-controlled.  Significant stress at home due to her husband's cognitive issues may contribute to blood pressure spikes. Her diet may include high sodium, affecting her blood pressure. She has compression stockings for leg swelling but finds them difficult to use.  Review of systems:  Please see the history of present illness. All other systems are reviewed and otherwise negative.      Studies Reviewed:    EKG Interpretation Date/Time:  Tuesday February 12 2024 13:24:14 EDT Ventricular Rate:  72 PR Interval:  154 QRS Duration:  92 QT Interval:  380 QTC Calculation: 416 R Axis:   -12  Text Interpretation: Normal sinus rhythm Minimal voltage criteria for LVH, may be normal variant ( R in aVL ) Cannot rule out Anterior infarct , age undetermined When compared with ECG of 26-Jul-2023 01:42, PREVIOUS ECG IS PRESENT Confirmed by Rana Dixon [phone]) on 02/12/2024 5:13:31 PM  Echocardiogram 10/31/2022 1. Left ventricular ejection fraction, by estimation, is 60 to 65%. The  left ventricle has normal function. The left ventricle has no regional  wall motion abnormalities. There is moderate asymmetric left ventricular  hypertrophy of the basal-septal  segment. Left ventricular diastolic parameters are consistent with Grade I  diastolic dysfunction (impaired  relaxation). Elevated left ventricular  end-diastolic pressure.   2. Right ventricular systolic function is normal. The right ventricular  size is normal. There is normal pulmonary artery systolic pressure.   3. Left atrial size was mildly dilated.   4. The mitral valve is normal in structure. Trivial mitral valve  regurgitation. No evidence of mitral stenosis.   5. The aortic valve is tricuspid. There is mild calcification of the  aortic valve. There is mild thickening of the aortic valve. Aortic valve  regurgitation is trivial. Mild aortic valve stenosis. Aortic valve area,  by VTI measures 1.50 cm. Aortic valve   mean gradient measures 12.0 mmHg. Aortic valve Vmax measures 2.43 m/s.   6. The inferior vena cava is normal in size with greater than 50%  respiratory variability, suggesting right atrial pressure of 3 mmHg.   Cardiac catheterization 06/15/2017 Ost LAD lesion, 20 %stenosed. Prox LAD lesion, 40 %stenosed. Ost 1st Diag to 1st Diag lesion, 50 %stenosed. Ost LAD to Prox LAD lesion, 30 %stenosed. Prox Cx to Mid Cx lesion, 20 %stenosed. LV end diastolic pressure is normal.   Coronary calcification with 20-30% proximal LAD stenosis before the takeoff of the first diagonal vessel, 50% ostial stenosis in a small diagonal -1 vessel with 40% LAD stenosis beyond the diagonal; mild 20% proximal LCX stenosis, and normal RCA.    LVEDP 14 - 15 mm Hg.   RECOMMENDATION: Increased medical therapy with addition of nitrates to current therapy and aggressive lipid lowering.  Diagnostic Dominance: Right  Risk Assessment/Calculations:              Physical Exam:   VS:  BP 130/74 (BP Location: Left Arm, Patient Position: Sitting, Cuff Size: Large)   Pulse 72   Ht 5' (1.524 m)   Wt 189 lb (85.7 kg)   BMI 36.91 kg/m    Wt Readings from Last 3 Encounters:  02/12/24 189 lb (85.7 kg)  02/04/24 186 lb 6.4 oz (84.6 kg)  01/15/24 182 lb (82.6 kg)    GEN: Well nourished, well developed  in no acute distress NECK: No JVD; No carotid bruits CARDIAC: RRR, no murmurs, rubs, gallops RESPIRATORY:  Clear to auscultation without rales, wheezing or rhonchi  ABDOMEN: Soft, non-tender, non-distended EXTREMITIES:  No edema; No acute deformity      Assessment and Plan:  Hypertension Blood pressure today is 130/74 Her daily home BP average 150s / 80s with higher readings in the a.m. Home BP cuff has been calibrated and checked several times with good accuracy - Plan to increase amlodipine  from 1.25 mg to 2.5 mg daily which she will take in the a.m. - Continue metoprolol  XL 25 mg daily in the p.m. - Maintain home BP log and monitoring  Coronary artery disease LHC 05/2017 showed noncritical coronary calcification with that 20% ostial LAD, 40% proximal LAD, 50% D1, 30% proximal LAD, 20% proximal and mid left circumflex lesion.  Medical therapy was recommended Echocardiogram 10/2022 with LVEF 60 to 65%, no RWMA - EKG today showed no acute ischemic changes - Today patient is without any anginal symptoms, no indication for further ischemic evaluation at this time - No changes to  current medical therapy - Continue aspirin  81 mg daily - Intolerant to statins  Mild aortic stenosis Echocardiogram 10/2022 with LVEF 60 to 65% with mild aortic valve stenosis - Today she remains asymptomatic - There is no indication for intervention at this time - Can repeat echocardiogram if clinically indicated  Hyperlipidemia LDL 107, TG 211, TC 205 on 09/2023 She is intolerant to statins and not interested in further cholesterol-lowering therapy - Continue with heart healthy dieting and physical exercise      Dispo:  Return in about 3 months (around 05/14/2024).  Signed, Lum LITTIE Louis, NP

## 2024-02-13 ENCOUNTER — Encounter: Admitting: Obstetrics and Gynecology

## 2024-02-25 ENCOUNTER — Telehealth: Payer: Self-pay | Admitting: Cardiovascular Disease

## 2024-02-25 MED ORDER — AMLODIPINE BESYLATE 2.5 MG PO TABS: 2.5000 mg | ORAL_TABLET | ORAL | 3 refills | Status: AC

## 2024-02-25 NOTE — Telephone Encounter (Signed)
*  STAT* If patient is at the pharmacy, call can be transferred to refill team.   1. Which medications need to be refilled? (please list name of each medication and dose if known)   amLODipine  (NORVASC ) 2.5 MG tablet    4. Which pharmacy/location (including street and city if local pharmacy) is medication to be sent to?  [facility] - [place], [place] - [address] Phone: [phone]  Fax: [phone]       5. Do they need a 30 day or 90 day supply? 90

## 2024-02-25 NOTE — Telephone Encounter (Signed)
 RX sent to requested Pharmacy

## 2024-02-26 ENCOUNTER — Encounter: Payer: Self-pay | Admitting: Gastroenterology

## 2024-02-27 NOTE — Telephone Encounter (Signed)
 Please check with the referral department.  Please see what the status is on this referral and update the patient/family.  Referral to psychiatry. 02/06/2024  4:13 PM Robin Arlyss RAMAN, MD Provider Comments -  Note: Refer to Dr. Vickey or Dr. Eappen if possible.

## 2024-02-27 NOTE — Telephone Encounter (Signed)
 I see that the order was placed but a referral was never entered.

## 2024-02-28 NOTE — Telephone Encounter (Signed)
Please check on the status of this referral. 

## 2024-02-28 NOTE — Telephone Encounter (Unsigned)
 Copied from CRM [id number]. Topic: General - Other >> Feb 28, 2024  2:18 PM Deleta RAMAN wrote: Reason for CRM: patient wants to know who were the 2 psychiatrist recommended from Dr. Cleatus she would like a call back to follow up on this matter

## 2024-03-04 NOTE — Telephone Encounter (Signed)
 Patient is having an issue getting set up for an appointment. She is 89 years old. Are you able to reach out to patient or the office in which the patient is being referred to. I have also provided them the contact information but they are still having difficulties. Thank you/

## 2024-03-04 NOTE — Telephone Encounter (Signed)
 Referral was sent on 06/24 with MyChart message sent to patient with info to call to schedule.

## 2024-03-23 ENCOUNTER — Encounter: Payer: Self-pay | Admitting: Family Medicine

## 2024-03-24 ENCOUNTER — Ambulatory Visit: Admitting: Family Medicine

## 2024-03-24 ENCOUNTER — Encounter: Payer: Self-pay | Admitting: Family Medicine

## 2024-03-24 VITALS — BP 132/76 | HR 74 | Temp 98.4°F | Ht 60.0 in | Wt 189.4 lb

## 2024-03-24 DIAGNOSIS — F339 Major depressive disorder, recurrent, unspecified: Secondary | ICD-10-CM | POA: Diagnosis not present

## 2024-03-24 NOTE — Progress Notes (Unsigned)
 D/w pt about mood follow up.  She isn't in-network for psych clinic.  Mood has been good with current meds. No SI/HI.  No ADE on med.  Mood is good per patient report.  No CP or SOB.  Trace BLE edema.  No FCNAVD.    Meds, vitals, and allergies reviewed.   ROS: Per HPI unless specifically indicated in ROS section   Nad Nat Neck supple, no LA Rrr Ctab Abd soft not ttp Speech and judgment normal/intact.

## 2024-03-24 NOTE — Patient Instructions (Addendum)
 Check your med list at home and let me know if it doesn't match up.  Let me know when you need refills or if your mood is worse in the meantime.  Take care.  Glad to see you.

## 2024-03-26 NOTE — Assessment & Plan Note (Signed)
 Patient has a longstanding history of depression that has been effectively treated with current medications.  Mood has been good with current meds. No SI/HI.  No ADE on med.   No clinical reason to change medications at this point.  She is not going to be able to follow-up with psychiatry easily.  She still has refills in the meantime.  Discussed that I could refill her medications in the future.  If she had significant mood changes we may need to change her medications at that point and/or seek reevaluation by psychiatry.  She can let me know when she needs refills.

## 2024-04-10 ENCOUNTER — Telehealth: Payer: Self-pay

## 2024-04-10 NOTE — Telephone Encounter (Signed)
 Copied from CRM [id number]. Topic: Clinical - Request for Lab/Test Order >> Apr 10, 2024  2:05 PM Rea C wrote: Reason for CRM: Patient would like to have labs done prior to her physical appointment on 09/09.   [phone] (M)

## 2024-04-10 NOTE — Telephone Encounter (Signed)
 Please call patient to set up lab appt prior to annual physical. Once lab appt is placed Dr. Cleatus will put in order. Thanks!

## 2024-04-10 NOTE — Telephone Encounter (Signed)
 Lvm call office e set up appt

## 2024-04-11 ENCOUNTER — Encounter: Admitting: Family Medicine

## 2024-04-11 NOTE — Telephone Encounter (Signed)
 Pt sch cpe for 04/29/24

## 2024-04-15 ENCOUNTER — Encounter: Admitting: Family Medicine

## 2024-04-17 ENCOUNTER — Encounter: Payer: Self-pay | Admitting: Gastroenterology

## 2024-04-17 ENCOUNTER — Ambulatory Visit: Admitting: Gastroenterology

## 2024-04-17 ENCOUNTER — Other Ambulatory Visit (HOSPITAL_COMMUNITY): Payer: Self-pay | Admitting: *Deleted

## 2024-04-17 VITALS — BP 122/80 | HR 76 | Ht 60.0 in | Wt 188.1 lb

## 2024-04-17 DIAGNOSIS — K648 Other hemorrhoids: Secondary | ICD-10-CM | POA: Diagnosis not present

## 2024-04-17 DIAGNOSIS — R1310 Dysphagia, unspecified: Secondary | ICD-10-CM

## 2024-04-17 DIAGNOSIS — K649 Unspecified hemorrhoids: Secondary | ICD-10-CM

## 2024-04-17 MED ORDER — HYDROCORTISONE ACETATE 25 MG RE SUPP: 25.0000 mg | Freq: Every evening | RECTAL | 1 refills | Status: AC

## 2024-04-17 NOTE — Patient Instructions (Addendum)
 We have sent the following medications to your pharmacy for you to pick up at your convenience: Hydrocortisone  Suppositories 25 mg  Please increase your water  intake   You have been scheduled for Modified Barium Swallow on 04/28/24 at 1:00 pm. Please arrive 30 minutes prior to your test for registration. You will go to [facility] (1st Floor) for your appointment. Should you need to cancel or reschedule your appointment, please contact [phone] [place]) or [phone] Geroge Long). _____________________________________________________________________ A Modified Barium Swallow Study, or MBS, is a special x-ray that is taken to check swallowing skills. It is carried out by a Marine scientist and a Warehouse manager (SLP). During this test, yourmouth, throat, and esophagus, a muscular tube which connects your mouth to your stomach, is checked. The test will help you, your doctor, and the SLP plan what types of foods and liquids are easier for you to swallow. The SLP will also identify positions and ways to help you swallow more easily and safely. What will happen during an MBS? You will be taken to an x-ray room and seated comfortably. You will be asked to swallow small amounts of food and liquid mixed with barium. Barium is a liquid or paste that allows images of your mouth, throat and esophagus to be seen on x-ray. The x-ray captures moving images of the food you are swallowing as it travels from your mouth through your throat and into your esophagus. This test helps identify whether food or liquid is entering your lungs (aspiration). The test also shows which part of your mouth or throat lacks strength or coordination to move the food or liquid in the right direction. This test typically takes 30 minutes to 1 hour to complete. _______________________________________________________________________  Please call to schedule follow up sooner if symptoms increase or  worsen  Due to recent changes in healthcare laws, you may see the results of your imaging and laboratory studies on MyChart before your provider has had a chance to review them.  We understand that in some cases there may be results that are confusing or concerning to you. Not all laboratory results come back in the same time frame and the provider may be waiting for multiple results in order to interpret others.  Please give us  48 hours in order for your provider to thoroughly review all the results before contacting the office for clarification of your results.   Thank you for trusting me with your gastrointestinal care!   Camie Furbish, PA _______________________________________________________  If your blood pressure at your visit was 140/90 or greater, please contact your primary care physician to follow up on this.  _______________________________________________________  If you are age 88 or older, your body mass index should be between 23-30. Your Body mass index is 36.74 kg/m. If this is out of the aforementioned range listed, please consider follow up with your Primary Care Provider.  If you are age 64 or younger, your body mass index should be between 19-25. Your Body mass index is 36.74 kg/m. If this is out of the aformentioned range listed, please consider follow up with your Primary Care Provider.   ________________________________________________________  The [facility] providers would like to encourage you to use MYCHART to communicate with providers for non-urgent requests or questions.  Due to long hold times on the telephone, sending your provider a message by [facility] may be a faster and more efficient way to get a response.  Please allow 48 business hours for a response.  Please remember that this is for non-urgent requests.  _______________________________________________________

## 2024-04-17 NOTE — Progress Notes (Signed)
 Robin Juarez [id number] June 02, 1931   Chief Complaint: Hemorrhoids, trouble swallowing pills  Referring Provider: Cleatus Arlyss RAMAN, MD Primary GI MD: Sampson  HPI: Robin Juarez is a 89 y.o. female with past medical history of arthritis, CAD, depression, fibromyalgia, GERD, hiatal hernia, HLD, HTN, OSA, stroke, appendectomy, hysterectomy who presents today for a complaint of dysphagia and hemorrhoids.    Seen by PCP 02/04/2024 at which time she endorsed hemorrhoids and perianal irritation.  Had been using Preparation H as needed.  On exam had external hemorrhoid.  She requested referral to GI to discuss options.  Also endorsed some pill dysphagia.   Patient here today with her daughter.  States she has had upper endoscopy as well as colonoscopy in the past.  Last colonoscopy done with Dr. Donnald in 2005 per chart review.  No polyps at that time.  Patient denies ever having had colon polyps.  States that over the last few months she has been having trouble swallowing pills.  States she takes medication 4 times a day and one of her pills is very large and she has difficulty getting it down.  Sometimes will try to swallow a pill and follow it with water  and feels like she is choking or getting strangled.  After a while he will eventually does go down.  States this does not happen often, just every now and then.  Has no trouble swallowing food or water .  Denies history of esophageal dilation.  States she has very rare acid reflux and denies any heartburn.  Denies nausea, vomiting, abdominal pain.  She also reports having had hemorrhoids for a while.  Denies any associated BRBPR.  Started using a hemorrhoid cream and was inserting her finger into her rectum and felt something abnormal/hard there.  Felt like anal opening was small.  She did have an evaluation by her PCP with finding of a small external hemorrhoid as noted above.  Denies any rectal pain or discomfort with hemorrhoid.   Used a topical treatment and denies any change in symptoms but states she did not use the hemorrhoid cream very long.  She has regular bowel movements, daily, without hard stools, straining, or constipation.  Denies any diarrhea.  No blood in her stool or melena.  Reports that stools are formed and thin but this is not new for her and stool caliber has not changed recently.  Denies any family history of colon cancer.  Previous GI Procedures/Imaging      Past Medical History:  Diagnosis Date   Arthritis    knee and hip OA- prev injection by Dr. Bonner   Blood transfusion without reported diagnosis    37 yrs ago -s/p back surgery   Chest pain    Coronary artery disease    50% mid LAD by cath January 2016   Depression 1993, 2000   Fibromyalgia    GERD (gastroesophageal reflux disease)    H/O hiatal hernia    Hyperlipidemia    Hypertension    OSA (obstructive sleep apnea)    off tx as of 2020, no sx as of 2020   Osteoporosis    Positive cardiac stress test    Stroke [facility])    Urinary incontinence    UTI (lower urinary tract infection)     Past Surgical History:  Procedure Laterality Date   ABDOMINAL HYSTERECTOMY  08/21/1974   APPENDECTOMY  08/21/1962   Back fusions  08/22/1975   missing vertebra   BACK SURGERY  fusions lumbar area   BILATERAL CARPAL TUNNEL RELEASE Bilateral 10/2022   BREAST BIOPSY  08/21/1993   x 2   BREAST BIOPSY  07/10/2012   Procedure: BREAST BIOPSY WITH NEEDLE LOCALIZATION;  Surgeon: Donnice Bury, MD;  Location: [facility];  Service: General;  Laterality: Right;   CARDIOVASCULAR STRESS TEST  08/22/2007   Normal, Dr. Shlomo   CARPAL TUNNEL RELEASE Bilateral 08/2022   CATARACT EXTRACTION, BILATERAL Bilateral 08/22/2003   Childbirth  1952,1956,1957,1960   CYSTOSCOPY N/A 11/25/2012   Procedure: CYSTOSCOPY;  Surgeon: Glendia DELENA Elizabeth, MD;  Location: [facility];  Service: Urology;  Laterality: N/A;   dilatation  and currettage  08/21/1962   LEFT HEART CATH AND CORONARY ANGIOGRAPHY N/A 06/15/2017   Procedure: LEFT HEART CATH AND CORONARY ANGIOGRAPHY;  Surgeon: Burnard Debby DELENA, MD;  Location: [facility];  Service: Cardiovascular;  Laterality: N/A;   LEFT HEART CATHETERIZATION WITH CORONARY ANGIOGRAM N/A 09/03/2014   Procedure: LEFT HEART CATHETERIZATION WITH CORONARY ANGIOGRAM;  Surgeon: Dorn JINNY Lesches, MD;  Location: [facility];  Service: Cardiovascular;  Laterality: N/A;   LUMBAR LAMINECTOMY/DECOMPRESSION MICRODISCECTOMY N/A 05/01/2013   Procedure: COMPLETE DECOMPRESSIVE LUMBAR LAMINECTOMY L3-L4 MICRODISCECTOMY L3-L4 ON THE LEFT;  Surgeon: Tanda DELENA Heading, MD;  Location: [facility];  Service: Orthopedics;  Laterality: N/A;   OVARIAN CYST REMOVAL  08/21/1962   PUBOVAGINAL SLING N/A 11/25/2012   Procedure: CARLOYN GLADE;  Surgeon: Glendia DELENA Elizabeth, MD;  Location: [facility];  Service: Urology;  Laterality: N/A;    Current Outpatient Medications  Medication Sig Dispense Refill   amLODipine  (NORVASC ) 2.5 MG tablet Take 1 tablet (2.5 mg total) by mouth every morning. 90 tablet 3   aspirin  EC 81 MG tablet Take 1 tablet (81 mg total) by mouth daily. Swallow whole. 30 tablet 12   Baclofen  5 MG TABS TAKE 1 TABLET BY MOUTH TWICE  DAILY AS NEEDED FOR MUSCLE  SPASMS 90 tablet 0   busPIRone (BUSPAR) 10 MG tablet as needed.     Cholecalciferol  (VITAMIN D3) 125 MCG (5000 UT) CAPS Take 1 capsule (5,000 Units total) by mouth daily.     Coenzyme Q10 (CO Q 10 PO) Take 1 tablet by mouth daily.     cyanocobalamin  (VITAMIN B12) 500 MCG tablet Take 1 tablet (500 mcg total) by mouth daily.     DULoxetine  (CYMBALTA ) 30 MG capsule Take 90 mg by mouth daily.      folic acid  (FOLVITE ) 800 MCG tablet Take 800 mcg by mouth daily.      gabapentin  (NEURONTIN ) 300 MG capsule 1 tab noon, 1 tab evening, 2 tabs bedtime (4 tabs total)     KRILL OIL PO Take 100 mg by mouth daily.     lamoTRIgine  (LAMICTAL )  100 MG tablet Take 1.5 tablets (150 mg total) by mouth daily after supper.     metoprolol  succinate (TOPROL -XL) 25 MG 24 hr tablet Take 1 tablet (25 mg total) by mouth at bedtime. 90 tablet 1   OLANZapine  (ZYPREXA ) 2.5 MG tablet Take 1 tablet (2.5 mg total) by mouth at bedtime.     No current facility-administered medications for this visit.    Allergies as of 04/17/2024 - Review Complete 03/24/2024  Allergen Reaction Noted   Digoxin Other (See Comments)    Nitrofurantoin Other (See Comments)    Alendronate sodium Other (See Comments)    Cephalosporins  09/15/2018   Lisinopril   06/21/2021   Phenobarbital-belladonna alk Other (See Comments) 09/21/2016  Praluent  [alirocumab ]  06/14/2021   Repatha  [evolocumab ]  09/15/2021   Ritalin [methylphenidate hcl]  09/12/2018   Statins  09/15/2018   Tetracyclines & related Other (See Comments) 10/01/2023   Trimethoprim  09/15/2021   Clavulanic acid Other (See Comments) 01/28/2013   Omeprazole Rash    Oxcarbazepine Other (See Comments)    Sulfa antibiotics Other (See Comments)    Tetracycline Itching    Zetia  [ezetimibe ] Other (See Comments) 06/10/2014    Family History  Problem Relation Age of Onset   Arthritis Mother    Stroke Father    Hypertension Father    Hypertension Sister    Dementia Sister    Aneurysm Sister        Brain   Parkinsonism Sister    Heart disease Brother        Died during CABG   Kidney disease Maternal Grandmother    Colon cancer Neg Hx    Breast cancer Neg Hx     Social History   Tobacco Use   Smoking status: Never    Passive exposure: Never   Smokeless tobacco: Never   Tobacco comments:    a small amount when 89 yo  Vaping Use   Vaping status: Never Used  Substance Use Topics   Alcohol use: No    Alcohol/week: 0.0 standard drinks of alcohol   Drug use: No     Review of Systems:    Constitutional: No unintentional weight loss, fever, chills Cardiovascular: No chest pain Respiratory: No SOB   Gastrointestinal: See HPI and otherwise negative Hematologic: No bleeding    Physical Exam:  Vital signs: BP 122/80   Pulse 76   Ht 5' (1.524 m)   Wt 188 lb 2 oz (85.3 kg)   BMI 36.74 kg/m    Constitutional: Pleasant elderly female in NAD, alert and cooperative Head:  Normocephalic and atraumatic.  Eyes: No scleral icterus.  Respiratory: Respirations even and unlabored. Lungs clear to auscultation bilaterally.  No wheezes, crackles, or rhonchi.  Cardiovascular:  Regular rate and rhythm. No murmurs. No peripheral edema. Gastrointestinal:  Soft, nondistended, nontender. No rebound or guarding. Normal bowel sounds. No appreciable masses or hepatomegaly. Rectal: Small nonbleeding, nonthrombosed external hemorrhoid/perianal skin tag.  No anal fissures seen.  Firm stool palpated on DRE.  Nonbleeding internal hemorrhoids on anoscopy.  Brown stool in rectum.  Chaperone present for exam. Neurologic:  Alert and oriented x4;  grossly normal neurologically.  Skin:   Dry and intact without significant lesions or rashes. Psychiatric: Oriented to person, place and time. Demonstrates good judgement and reason without abnormal affect or behaviors.  RELEVANT LABS AND IMAGING: CBC    Component Value Date/Time   WBC 8.0 10/01/2023 1615   RBC 4.15 10/01/2023 1615   HGB 13.7 10/01/2023 1615   HCT 40.7 10/01/2023 1615   PLT 250.0 10/01/2023 1615   MCV 98.1 10/01/2023 1615   MCH 32.6 07/26/2023 0237   MCHC 33.7 10/01/2023 1615   RDW 13.5 10/01/2023 1615   LYMPHSABS 2.4 10/01/2023 1615   MONOABS 0.5 10/01/2023 1615   EOSABS 0.2 10/01/2023 1615   BASOSABS 0.1 10/01/2023 1615    CMP     Component Value Date/Time   NA 141 10/01/2023 1615   NA 144 08/01/2018 0947   K 3.9 10/01/2023 1615   CL 101 10/01/2023 1615   CO2 32 10/01/2023 1615   GLUCOSE 92 10/01/2023 1615   BUN 8 10/01/2023 1615   BUN 15 08/01/2018 0947   CREATININE 0.74  10/01/2023 1615   CREATININE 0.81 08/31/2014 1621    CALCIUM  9.5 10/01/2023 1615   CALCIUM  9.3 05/23/2010 2201   PROT 6.6 10/01/2023 1615   PROT 6.6 08/01/2018 0947   ALBUMIN 4.2 10/01/2023 1615   ALBUMIN 4.5 08/01/2018 0947   AST 15 10/01/2023 1615   ALT 10 10/01/2023 1615   ALKPHOS 79 10/01/2023 1615   BILITOT 0.6 10/01/2023 1615   BILITOT 0.6 08/01/2018 0947   GFRNONAA >60 07/26/2023 0237   GFRAA >60 01/02/2020 1019     Assessment/Plan:   Hemorrhoids Patient reports having trouble with hemorrhoids for a while.  Was using a topical hemorrhoid cream and inserting cream into her rectum and felt something hard and abnormal there.  Not having any pain or bleeding.  Did not use hemorrhoid cream for very long.  Had colonoscopies in the past with most recent procedure done in 2005 per chart review.  Patient states she has never had any polyps, and no family history of colon cancer. On exam she has a small nonbleeding, nonthrombosed external hemorrhoid versus perianal skin tag as well as internal hemorrhoids on anoscopy.  Firm brown stool in rectum.  No other abnormalities seen.  - Will send Anusol  suppositories for patient to use once nightly for 7 to 10 days. - Recommend high-fiber diet - Increase water  intake - If symptoms persist or worsen consider flexible sigmoidoscopy for further evaluation.  Pill dysphagia Patient reports a few months of difficulty swallowing pills.  States she has trouble with one of her pills in particular which is large, and when she tries to take it with water  can feel choked/strangled.  This is not occurring very often.  Not having any trouble swallowing food or water  otherwise.  Reports having an EGD in the past but no prior esophageal dilation.  - Will order modified barium swallow for initial evaluation, possibly followed by barium swallow if no abnormal findings or concern for aspiration. - If symptoms worsen consider EGD.   Camie Furbish, PA-C [facility] 04/17/2024, 12:10 PM  Patient Care  Team: Cleatus Arlyss RAMAN, MD as PCP - General Court Dorn PARAS, MD as PCP - Cardiology (Cardiology) Noralyn Flock, MD as Consulting Physician (Psychiatry) Court Dorn PARAS, MD as Consulting Physician (Cardiology) Morrie Pinna, MD as Consulting Physician (Obstetrics and Gynecology) Estelle Fallow as Referring Physician (Optometry) Judene Macleod, DDS as Referring Physician (Dentistry)

## 2024-04-19 ENCOUNTER — Encounter: Payer: Self-pay | Admitting: Gastroenterology

## 2024-04-28 ENCOUNTER — Encounter (HOSPITAL_COMMUNITY)

## 2024-04-29 ENCOUNTER — Encounter: Payer: Self-pay | Admitting: Family Medicine

## 2024-04-29 ENCOUNTER — Ambulatory Visit: Admitting: Family Medicine

## 2024-04-29 VITALS — BP 128/78 | HR 72 | Temp 98.0°F | Ht 60.5 in | Wt 188.6 lb

## 2024-04-29 DIAGNOSIS — Z23 Encounter for immunization: Secondary | ICD-10-CM | POA: Diagnosis not present

## 2024-04-29 DIAGNOSIS — F339 Major depressive disorder, recurrent, unspecified: Secondary | ICD-10-CM

## 2024-04-29 DIAGNOSIS — E559 Vitamin D deficiency, unspecified: Secondary | ICD-10-CM

## 2024-04-29 DIAGNOSIS — Z7189 Other specified counseling: Secondary | ICD-10-CM

## 2024-04-29 DIAGNOSIS — I10 Essential (primary) hypertension: Secondary | ICD-10-CM | POA: Diagnosis not present

## 2024-04-29 DIAGNOSIS — Z0000 Encounter for general adult medical examination without abnormal findings: Secondary | ICD-10-CM

## 2024-04-29 LAB — COMPREHENSIVE METABOLIC PANEL WITH GFR
ALT: 13 U/L (ref 0–35)
AST: 15 U/L (ref 0–37)
Albumin: 4.5 g/dL (ref 3.5–5.2)
Alkaline Phosphatase: 71 U/L (ref 39–117)
BUN: 13 mg/dL (ref 6–23)
CO2: 33 meq/L — ABNORMAL HIGH (ref 19–32)
Calcium: 9.7 mg/dL (ref 8.4–10.5)
Chloride: 100 meq/L (ref 96–112)
Creatinine, Ser: 0.75 mg/dL (ref 0.40–1.20)
GFR: 69.87 mL/min (ref >60.00)
Glucose, Bld: 86 mg/dL (ref 70–99)
Potassium: 4.2 meq/L (ref 3.5–5.1)
Sodium: 140 meq/L (ref 135–145)
Total Bilirubin: 0.6 mg/dL (ref 0.2–1.2)
Total Protein: 7 g/dL (ref 6.0–8.3)

## 2024-04-29 LAB — CBC WITH DIFFERENTIAL/PLATELET
Basophils Absolute: 0.1 K/uL (ref 0.0–0.1)
Basophils Relative: 0.8 % (ref 0.0–3.0)
Eosinophils Absolute: 0.3 K/uL (ref 0.0–0.7)
Eosinophils Relative: 3.3 % (ref 0.0–5.0)
HCT: 41.1 % (ref 36.0–46.0)
Hemoglobin: 13.8 g/dL (ref 12.0–15.0)
Lymphocytes Relative: 31.2 % (ref 12.0–46.0)
Lymphs Abs: 2.4 K/uL (ref 0.7–4.0)
MCHC: 33.7 g/dL (ref 30.0–36.0)
MCV: 98.3 fl (ref 78.0–100.0)
Monocytes Absolute: 0.5 K/uL (ref 0.1–1.0)
Monocytes Relative: 6.4 % (ref 3.0–12.0)
Neutro Abs: 4.4 K/uL (ref 1.4–7.7)
Neutrophils Relative %: 58.3 % (ref 43.0–77.0)
Platelets: 250 K/uL (ref 150.0–400.0)
RBC: 4.18 Mil/uL (ref 3.87–5.11)
RDW: 14.1 % (ref 11.5–15.5)
WBC: 7.6 K/uL (ref 4.0–10.5)

## 2024-04-29 LAB — TSH: TSH: 3.34 u[IU]/mL (ref 0.35–5.50)

## 2024-04-29 LAB — VITAMIN D 25 HYDROXY (VIT D DEFICIENCY, FRACTURES): VITD: 37.44 ng/mL (ref 30.00–100.00)

## 2024-04-29 MED ORDER — BACLOFEN 5 MG PO TABS: 1.0000 | ORAL_TABLET | Freq: Two times a day (BID) | ORAL | 3 refills | Status: AC | PRN

## 2024-04-29 NOTE — Patient Instructions (Signed)
Flu shot today.  Go to the lab on the way out.   If you have mychart we'll likely use that to update you.    Take care.  Glad to see you.

## 2024-04-29 NOTE — Progress Notes (Unsigned)
 Tetanus 2015 Flu shot due this fall.   PNA shot up to date.   shingrix  utd covid vaccine prev done.   RSV prev done.   Colon screening not due.  Pap not due.   DXA declined.  D/w pt.   Mammogram declined today by patient.   Advance directive d/w pt. Husband designated if patient were incapacitated.  If he is also incapacitated, then her kids are equally designated.    Mood d/w pt.  Not tearful or sad.  No SI/HI.    Hypertension:               Using medication without problems or lightheadedness: rarely lightheaded, cautions d/w pt.  No falls.  Chest pain with exertion:no Edema: mild BLE edema.   Short of breath: no Prev statin intolerant.    Meds, vitals, and allergies reviewed.   ROS: Per HPI unless specifically indicated in ROS section

## 2024-04-30 ENCOUNTER — Ambulatory Visit (HOSPITAL_COMMUNITY)
Admission: RE | Admit: 2024-04-30 | Discharge: 2024-04-30 | Disposition: A | Discharge status: Home / Self Care | Source: Ambulatory Visit | Attending: Gastroenterology | Admitting: Gastroenterology

## 2024-04-30 DIAGNOSIS — K649 Unspecified hemorrhoids: Secondary | ICD-10-CM

## 2024-04-30 DIAGNOSIS — R1310 Dysphagia, unspecified: Secondary | ICD-10-CM | POA: Diagnosis present

## 2024-04-30 NOTE — Assessment & Plan Note (Signed)
Advance directive d/w pt. Husband designated if patient were incapacitated.  If he is also incapacitated, then her kids are equally designated.

## 2024-04-30 NOTE — Assessment & Plan Note (Signed)
 Not tearful or sad.  No SI/HI.   Still on baseline medications and compliant.  Those medications that help with depressive symptoms over the years.  Not sedated.  Agreed that I could prescribe her medications in the future, assuming her mood was still reasonable.  No change in meds today.

## 2024-04-30 NOTE — Progress Notes (Signed)
 Modified Barium Swallow Study  Patient Details  Name: Robin Juarez MRN: [id number] Date of Birth: 1930/09/30  Today's Date: 04/30/2024  HPI/PMH: HPI: pt is a 89 yo female referred by Camie Furbish, PA-C for GI for MBS.  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  She endorses having a hard time swallowing pills *COQ10 in particular* stating she will feel like she is going to choke causing her to spit out the liquids.  She states she feels like a muscle is coming back up into her throat.  Reports this happens infrequently.  Denies pna, weight loss unintended, or requiring heimilch manuever. She reports h/o GERD and states she never required reflux medications.   States she had prior endoscopy but does not recall for certain when this occured. States her prior GERD symptoms presented as heartburn and something coming back up but current symptoms are not consistent with prior.   Clinical Impression: Clinical Impression: Patient presents with functional oropharyngeal swallow ability without aspiration or difficulty with swallows all consistencies tested. She was able to easily swallow barium tablet with thin without any issues.  Trace laryngeal penetration of thin noted that cleared. Swallow was strong and timely without retention. Of note, pt appears with prominent PES that did not trap barium above it.   Using teach back and reviewing video, pt educated to findings/recommendations.  Factors that may increase risk of adverse event in presence of aspiration Noe & Lianne 2021): No data recorded  Recommendations/Plan: Swallowing Evaluation Recommendations Swallowing Evaluation Recommendations Recommendations: PO diet PO Diet Recommendation: Regular; Thin liquids (Level 0) Liquid Administration via: Cup;  Straw Medication Administration: Whole meds with liquid (try with slightly thicker liquids if helpful) Supervision: Patient able to self-feed Swallowing strategies  : Slow rate; Small bites/sips Postural changes: Position pt fully upright for meals; Stay upright 30-60 min after meals Oral care recommendations: Oral care BID (2x/day)    Treatment Plan No data recorded   Recommendations Recommendations for follow up therapy are one component of a multi-disciplinary discharge planning process, led by the attending physician.  Recommendations may be updated based on patient status, additional functional criteria and insurance authorization.  Assessment: Orofacial Exam: Orofacial Exam Oral Cavity: Oral Hygiene: WFL Oral Cavity - Dentition: Adequate natural dentition Orofacial Anatomy: WFL Oral Motor/Sensory Function: WFL (very subtle left facial asymmetry)    Anatomy:  Anatomy: Prominent cricopharyngeus   Boluses Administered: Boluses Administered Boluses Administered: Thin liquids (Level 0); Moderately thick liquids (Level 3, honey thick); Mildly thick liquids (Level 2, nectar thick); Puree; Solid     Oral Impairment Domain: Oral Impairment Domain Lip Closure: No labial escape Tongue control during bolus hold: Cohesive bolus between tongue to palatal seal Bolus preparation/mastication: Timely and efficient chewing and mashing Bolus transport/lingual motion: Brisk tongue motion Oral residue: Trace residue lining oral structures Location of oral residue : Tongue     Pharyngeal Impairment Domain: Pharyngeal Impairment Domain Soft palate elevation: No bolus between soft palate (SP)/pharyngeal wall (PW) Laryngeal elevation: Complete superior movement of thyroid  cartilage with complete approximation of arytenoids to epiglottic petiole Anterior hyoid excursion: Complete anterior movement Epiglottic movement: Complete inversion Laryngeal vestibule closure: Incomplete, narrow  column air/contrast in laryngeal vestibule Pharyngeal stripping wave : Present - complete Pharyngeal contraction (A/P view only): N/A (pt in hospital wheelchair, too difficult to manuever) Pharyngoesophageal segment opening: Partial distention/partial  duration, partial obstruction of flow Tongue base retraction: No contrast between tongue base and posterior pharyngeal wall (PPW) Pharyngeal residue: Complete pharyngeal clearance Location of pharyngeal residue: Tongue base     Esophageal Impairment Domain: Esophageal Impairment Domain Esophageal clearance upright position: Esophageal retention with retrograde flow below pharyngoesophageal segment (PES)    Pill: Pill Consistency administered: Thin liquids (Level 0) Thin liquids (Level 0): [facility]    Penetration/Aspiration Scale Score: Penetration/Aspiration Scale Score 1.  Material does not enter airway: Moderately thick liquids (Level 3, honey thick); Mildly thick liquids (Level 2, nectar thick); Puree; Solid; Pill 2.  Material enters airway, remains ABOVE vocal cords then ejected out: Thin liquids (Level 0)    Compensatory Strategies: Compensatory Strategies Compensatory strategies: No       General Information: Caregiver present: No   Diet Prior to this Study: Regular; Thin liquids (Level 0)    Temperature : Normal    Respiratory Status: WFL    Supplemental O2: None (Room air)    History of Recent Intubation: No   Behavior/Cognition: Alert; Cooperative  Self-Feeding Abilities: Able to self-feed  Baseline vocal quality/speech: Normal  Volitional Cough: Able to elicit  Volitional Swallow: Able to elicit  Exam Limitations: Other (comment)   Goal Planning: No data recorded No data recorded No data recorded Patient/Family Stated Goal: pt reports occasional issues swallowing pills- most notably CoQ10  No data recorded  Pain: Pain Assessment Pain Assessment: No/denies pain    End of Session: Start  Time:SLP Start Time (ACUTE ONLY): 1315  Stop Time: SLP Stop Time (ACUTE ONLY): 1339  Time Calculation:SLP Time Calculation (min) (ACUTE ONLY): 24 min  Charges: SLP Evaluations $ SLP Speech Visit: 1 Visit  SLP Evaluations $Outpatient MBS Swallow: 1 Procedure   SLP visit diagnosis: SLP Visit Diagnosis: Dysphagia, unspecified (R13.10)    Past Medical History:  Past Medical History:  Diagnosis Date   Arthritis    knee and hip OA- prev injection by Dr. Bonner   Blood transfusion without reported diagnosis    s/p back surgery   Chest pain    Coronary artery disease    50% mid LAD by cath January 2016   Depression 1993, 2000   Fibromyalgia    GERD (gastroesophageal reflux disease)    H/O hiatal hernia    Hyperlipidemia    Hypertension    OSA (obstructive sleep apnea)    off tx as of 2020, no sx as of 2020   Osteoporosis    Positive cardiac stress test    Stroke [facility])    Urinary incontinence    UTI (lower urinary tract infection)    Past Surgical History:  Past Surgical History:  Procedure Laterality Date   ABDOMINAL HYSTERECTOMY  08/21/1974   APPENDECTOMY  08/21/1962   Back fusions  08/22/1975   missing vertebra   BACK SURGERY     fusions lumbar area   BILATERAL CARPAL TUNNEL RELEASE Bilateral 10/2022   BREAST BIOPSY  08/21/1993   x 2   BREAST BIOPSY  07/10/2012   Procedure: BREAST BIOPSY WITH NEEDLE LOCALIZATION;  Surgeon: Donnice Bury, MD;  Location: [facility];  Service: General;  Laterality: Right;   CARDIOVASCULAR STRESS TEST  08/22/2007   Normal, Dr. Shlomo   CARPAL TUNNEL RELEASE Bilateral 08/2022   CATARACT EXTRACTION, BILATERAL Bilateral 08/22/2003   Childbirth  1954,1956,1957,1960   CYSTOSCOPY N/A 11/25/2012   Procedure: CYSTOSCOPY;  Surgeon: Glendia DELENA Elizabeth, MD;  Location: [facility];  Service: Urology;  Laterality:  N/A;   dilatation and currettage  08/21/1962   LEFT HEART CATH AND CORONARY ANGIOGRAPHY N/A  06/15/2017   Procedure: LEFT HEART CATH AND CORONARY ANGIOGRAPHY;  Surgeon: Burnard Debby LABOR, MD;  Location: [facility];  Service: Cardiovascular;  Laterality: N/A;   LEFT HEART CATHETERIZATION WITH CORONARY ANGIOGRAM N/A 09/03/2014   Procedure: LEFT HEART CATHETERIZATION WITH CORONARY ANGIOGRAM;  Surgeon: Dorn JINNY Lesches, MD;  Location: [facility] CATH LAB;  Service: Cardiovascular;  Laterality: N/A;   LUMBAR LAMINECTOMY/DECOMPRESSION MICRODISCECTOMY N/A 05/01/2013   Procedure: COMPLETE DECOMPRESSIVE LUMBAR LAMINECTOMY L3-L4 MICRODISCECTOMY L3-L4 ON THE LEFT;  Surgeon: Tanda LABOR Heading, MD;  Location: [facility];  Service: Orthopedics;  Laterality: N/A;   OVARIAN CYST REMOVAL  08/21/1962   PUBOVAGINAL SLING N/A 11/25/2012   Procedure: CARLOYN GLADE;  Surgeon: Glendia LABOR Elizabeth, MD;  Location: [facility];  Service: Urology;  Laterality: N/A;     Madelin POUR, MS [facility] SLP Acute Rehab Services Office [phone]  Nicolas Emmie Caldron 04/30/2024, 2:31 PM

## 2024-04-30 NOTE — Assessment & Plan Note (Addendum)
 Prev statin intolerant.   Continue amlodipine  and metoprolol .  See notes on labs.

## 2024-04-30 NOTE — Assessment & Plan Note (Signed)
 Tetanus 2015 Flu 2025 PNA shot up to date.   shingrix  utd covid vaccine prev done.   RSV prev done.   Colon screening not due.  Pap not due.   DXA declined.  D/w pt.   Mammogram declined today by patient.   Advance directive d/w pt. Husband designated if patient were incapacitated.  If he is also incapacitated, then her kids are equally designated.

## 2024-05-02 ENCOUNTER — Encounter: Payer: Self-pay | Admitting: General Practice

## 2024-05-02 ENCOUNTER — Ambulatory Visit: Payer: Self-pay | Admitting: General Practice

## 2024-05-02 ENCOUNTER — Ambulatory Visit: Admitting: General Practice

## 2024-05-02 VITALS — BP 122/58 | HR 77 | Temp 98.0°F | Ht 60.05 in | Wt 191.8 lb

## 2024-05-02 DIAGNOSIS — N3001 Acute cystitis with hematuria: Secondary | ICD-10-CM

## 2024-05-02 DIAGNOSIS — R3915 Urgency of urination: Secondary | ICD-10-CM

## 2024-05-02 DIAGNOSIS — B9689 Other specified bacterial agents as the cause of diseases classified elsewhere: Secondary | ICD-10-CM

## 2024-05-02 DIAGNOSIS — N898 Other specified noninflammatory disorders of vagina: Secondary | ICD-10-CM

## 2024-05-02 DIAGNOSIS — R300 Dysuria: Secondary | ICD-10-CM

## 2024-05-02 LAB — CBC WITH DIFFERENTIAL/PLATELET
Absolute Lymphocytes: 2574 {cells}/uL (ref 850–3900)
Absolute Monocytes: 663 {cells}/uL (ref 200–950)
Basophils Absolute: 69 {cells}/uL (ref 0–200)
Basophils Relative: 0.7 %
Eosinophils Absolute: 307 {cells}/uL (ref 15–500)
Eosinophils Relative: 3.1 %
HCT: 38.2 % (ref 35.0–45.0)
Hemoglobin: 12.8 g/dL (ref 11.7–15.5)
MCH: 32.6 pg (ref 27.0–33.0)
MCHC: 33.5 g/dL (ref 32.0–36.0)
MCV: 97.2 fL (ref 80.0–100.0)
MPV: 10.4 fL (ref 7.5–12.5)
Monocytes Relative: 6.7 %
Neutro Abs: 6287 {cells}/uL (ref 1500–7800)
Neutrophils Relative %: 63.5 %
Platelets: 256 Thousand/uL (ref 140–400)
RBC: 3.93 Million/uL (ref 3.80–5.10)
RDW: 13 % (ref 11.0–15.0)
Total Lymphocyte: 26 %
WBC: 9.9 Thousand/uL (ref 3.8–10.8)

## 2024-05-02 LAB — POC URINALSYSI DIPSTICK (AUTOMATED)
Bilirubin, UA: NEGATIVE
Blood, UA: 25
Glucose, UA: NEGATIVE
Ketones, UA: POSITIVE
Nitrite, UA: POSITIVE
Protein, UA: POSITIVE — AB
Spec Grav, UA: 1.015 (ref 1.010–1.025)
Urobilinogen, UA: 2 U/dL — AB
pH, UA: 6 (ref 5.0–8.0)

## 2024-05-02 LAB — BASIC METABOLIC PANEL WITH GFR
BUN: 14 mg/dL (ref 7–25)
CO2: 32 mmol/L (ref 20–32)
Calcium: 9.5 mg/dL (ref 8.6–10.4)
Chloride: 100 mmol/L (ref 98–110)
Creat: 0.84 mg/dL (ref 0.60–0.95)
Glucose, Bld: 106 mg/dL — ABNORMAL HIGH (ref 65–99)
Potassium: 3.5 mmol/L (ref 3.5–5.3)
Sodium: 140 mmol/L (ref 135–146)
eGFR: 66 mL/min/1.73m2 (ref >=60)

## 2024-05-02 MED ORDER — CIPROFLOXACIN HCL 500 MG PO TABS: 500.0000 mg | ORAL_TABLET | Freq: Two times a day (BID) | ORAL | 0 refills | Status: AC

## 2024-05-02 MED ORDER — CIPROFLOXACIN HCL 500 MG PO TABS: 500.0000 mg | ORAL_TABLET | Freq: Two times a day (BID) | ORAL | 0 refills | Status: DC

## 2024-05-02 NOTE — Patient Instructions (Addendum)
 Stop by the lab prior to leaving today. I will notify you of your results once received.   Start Ciprofloxacin  500 mg twice daily for five days.   I will be in touch with the swab and the culture results.   As discussed please go to the ER if you develop other symptoms.   Update me if your symptoms worsen or do not improve.   It was a pleasure meeting you!

## 2024-05-02 NOTE — Progress Notes (Signed)
 Established Patient Office Visit  Subjective   Patient ID: Robin Juarez, female    DOB: December 15, 1930  Age: 89 y.o. MRN: [id number]  Chief Complaint  Patient presents with   Vaginal Pain    burning and pain; patient gets stinging all over her body. Has vaginal irritation due to using pads and pulls ups all the time. Patient used cortizone and vasoline on her vaginal area and thinks she very much irritated her skin. Started 4 days ago.     Dysuria  Associated symptoms include urgency. Pertinent negatives include no chills, flank pain, frequency, hematuria, nausea or vomiting.   Robin Juarez is a 89 year old female, patient of Dr. Cleatus, presents today for an acute visit.   Discussed the use of AI scribe software for clinical note transcription with the patient, who gave verbal consent to proceed.  History of Present Illness She has been experiencing urinary burning and stinging for approximately two days, beginning on Tuesday night. The burning sensation occurs intermittently during urination, while the stinging sensation is described as a 'shock' affecting her whole body.  She denies increased frequency of urination but reports some urgency, with instances where she feels an urgent need to urinate but is unable to void. No lower abdominal pain, pressure, back pain, or hematuria is noted.  Additionally, she experiences irritation in the vaginal area, both internally and externally, which she attributes to prolonged use of pads and pull-ups. She has been applying cortisone cream daily for this irritation and recently used Vaseline, which she believes worsened the condition. No vaginal discharge is reported, only irritation and a fishy odor.    Patient Active Problem List   Diagnosis Date Noted   Memory change 02/07/2024   Hemorrhoids 02/07/2024   Pill dysphagia 02/07/2024   SOBOE (shortness of breath on exertion) 07/15/2023   Positive ANA (antinuclear antibody) 10/23/2022    Edema 10/22/2022   Palpitations 09/05/2022   Aortic stenosis, mild 09/05/2022   Shoulder pain 04/05/2022   Venous stasis of both lower extremities 01/10/2022   Gait instability 01/10/2022   Chronic pain of both shoulders 12/13/2021   Bilateral shoulder region arthritis 11/15/2021   Cerebrovascular accident [facility]) 06/30/2021   Osteoarthritis 06/30/2021   Calculus of gallbladder without cholecystitis without obstruction 02/13/2021   Coronary artery disease 09/21/2020   Health care maintenance 05/22/2019   Vitamin D  deficiency 05/22/2019   Chest pain 06/14/2017   Left hip pain 12/20/2016   Skin change 03/03/2016   Leg length discrepancy 11/19/2014   Dysuria 10/25/2014   Positive cardiac stress test    Essential hypertension 08/19/2014   Atypical chest pain 08/18/2014   Cerebral infarction (HCC) 08/18/2014   Balance problem 07/14/2014   Advance care planning 03/02/2014   Spinal stenosis, lumbar region, with neurogenic claudication 05/01/2013   Low back pain 04/17/2013   Paresthesia 08/21/2012   Tremor 08/21/2012   OSA (obstructive sleep apnea) 08/07/2012   Medicare annual wellness visit, subsequent 01/12/2012   Thumb pain 08/25/2011   Weakness 04/27/2011   HYPERGLYCEMIA, MILD 09/12/2010   DISORDER OF BONE AND CARTILAGE UNSPECIFIED 05/23/2010   Anxiety state 03/01/2010   ESOPHAGEAL SPASM 03/01/2010   HIATAL HERNIA WITH REFLUX 03/01/2010   GENERALIZED OSTEOARTHROSIS UNSPECIFIED SITE 03/01/2010   Rheumatism and fibrositis 03/01/2010   Myalgia 03/01/2010   OSTEOPOROSIS 03/01/2010   HLD (hyperlipidemia) 02/25/2010   MDD (major depressive disorder) 02/25/2010   GERD 02/25/2010   URINARY INCONTINENCE 02/25/2010   UTI'S, HX OF 02/25/2010   Past  Medical History:  Diagnosis Date   Arthritis    knee and hip OA- prev injection by Dr. Bonner   Blood transfusion without reported diagnosis    s/p back surgery   Chest pain    Coronary artery disease    50% mid LAD by cath January  2016   Depression 1993, 2000   Fibromyalgia    GERD (gastroesophageal reflux disease)    H/O hiatal hernia    Hyperlipidemia    Hypertension    OSA (obstructive sleep apnea)    off tx as of 2020, no sx as of 2020   Osteoporosis    Positive cardiac stress test    Stroke [facility])    Urinary incontinence    UTI (lower urinary tract infection)    Past Surgical History:  Procedure Laterality Date   ABDOMINAL HYSTERECTOMY  08/21/1974   APPENDECTOMY  08/21/1962   Back fusions  08/22/1975   missing vertebra   BACK SURGERY     fusions lumbar area   BILATERAL CARPAL TUNNEL RELEASE Bilateral 10/2022   BREAST BIOPSY  08/21/1993   x 2   BREAST BIOPSY  07/10/2012   Procedure: BREAST BIOPSY WITH NEEDLE LOCALIZATION;  Surgeon: Donnice Bury, MD;  Location: [facility];  Service: General;  Laterality: Right;   CARDIOVASCULAR STRESS TEST  08/22/2007   Normal, Dr. Shlomo   CARPAL TUNNEL RELEASE Bilateral 08/2022   CATARACT EXTRACTION, BILATERAL Bilateral 08/22/2003   Childbirth  1954,1956,1957,1960   CYSTOSCOPY N/A 11/25/2012   Procedure: CYSTOSCOPY;  Surgeon: Glendia DELENA Elizabeth, MD;  Location: [facility];  Service: Urology;  Laterality: N/A;   dilatation and currettage  08/21/1962   LEFT HEART CATH AND CORONARY ANGIOGRAPHY N/A 06/15/2017   Procedure: LEFT HEART CATH AND CORONARY ANGIOGRAPHY;  Surgeon: Burnard Debby DELENA, MD;  Location: [facility];  Service: Cardiovascular;  Laterality: N/A;   LEFT HEART CATHETERIZATION WITH CORONARY ANGIOGRAM N/A 09/03/2014   Procedure: LEFT HEART CATHETERIZATION WITH CORONARY ANGIOGRAM;  Surgeon: Dorn JINNY Lesches, MD;  Location: [facility] CATH LAB;  Service: Cardiovascular;  Laterality: N/A;   LUMBAR LAMINECTOMY/DECOMPRESSION MICRODISCECTOMY N/A 05/01/2013   Procedure: COMPLETE DECOMPRESSIVE LUMBAR LAMINECTOMY L3-L4 MICRODISCECTOMY L3-L4 ON THE LEFT;  Surgeon: Tanda DELENA Heading, MD;  Location: [facility];  Service: Orthopedics;   Laterality: N/A;   OVARIAN CYST REMOVAL  08/21/1962   PUBOVAGINAL SLING N/A 11/25/2012   Procedure: CARLOYN GLADE;  Surgeon: Glendia DELENA Elizabeth, MD;  Location: [facility];  Service: Urology;  Laterality: N/A;   Allergies  Allergen Reactions   Digoxin Other (See Comments)    Felt weak, tired, awful   Nitrofurantoin Other (See Comments)    Headache, intolerant   Alendronate Sodium Other (See Comments)    cramps   Cephalosporins     She felt bad while taking multiple cephalosporins but has been able tolerate amoxicillin  previously.  No clear cephalosporin allergy.   Lisinopril      wheezing   Phenobarbital-Belladonna Alk Other (See Comments)   Praluent  [Alirocumab ]     myalgia   Repatha  [Evolocumab ]     myalgias   Ritalin [Methylphenidate Hcl]     intolerant   Statins     Myalgia.  Intolerant of multiple statins, including pravastatin  and simvastatin .   Tetracyclines & Related Other (See Comments)   Trimethoprim     Wheezing lip swelling   Clavulanic Acid Other (See Comments)    Not an allergy.  Tolerates plain amoxil  but not augmentin - diarrhea, oral  irritation.     Omeprazole Rash    rash   Oxcarbazepine Other (See Comments)    unknown   Sulfa Antibiotics Other (See Comments)    Headache, intolerant   Tetracycline Itching    itching   Zetia  [Ezetimibe ] Other (See Comments)    possible cause of aches and abnormal dreams, stopped 05/2014         01/15/2024   11:42 AM 04/05/2023    2:06 PM 02/19/2023    3:22 PM  Depression screen PHQ 2/9  Decreased Interest 0 1 1  Down, Depressed, Hopeless 0 1 1  PHQ - 2 Score 0 2 2  Altered sleeping  1 1  Tired, decreased energy  1 1  Change in appetite  0 0  Feeling bad or failure about yourself   0 0  Trouble concentrating  0 0  Moving slowly or fidgety/restless  0 0  Suicidal thoughts  0 0  PHQ-9 Score  4 4  Difficult doing work/chores  Not difficult at all Not difficult at all       04/05/2023     2:06 PM 02/19/2023    3:22 PM  GAD 7 : Generalized Anxiety Score  Nervous, Anxious, on Edge 0 0  Control/stop worrying 0 0  Worry too much - different things 0 0  Trouble relaxing 0 0  Restless 0 0  Easily annoyed or irritable 0 0  Afraid - awful might happen 0 0  Total GAD 7 Score 0 0  Anxiety Difficulty Not difficult at all Not difficult at all      Review of Systems  Constitutional:  Negative for chills and fever.  Respiratory:  Negative for shortness of breath.   Cardiovascular:  Negative for chest pain.  Gastrointestinal:  Negative for abdominal pain, constipation, diarrhea, heartburn, nausea and vomiting.  Genitourinary:  Positive for dysuria and urgency. Negative for flank pain, frequency and hematuria.  Neurological:  Negative for dizziness and headaches.  Endo/Heme/Allergies:  Negative for polydipsia.  Psychiatric/Behavioral:  Negative for depression and suicidal ideas. The patient is not nervous/anxious.       Objective:     BP (!) 122/58   Pulse 77   Temp 98 F (36.7 C) (Oral)   Ht 5' 0.05 (1.525 m)   Wt 191 lb 12.8 oz (87 kg)   SpO2 97%   BMI 37.40 kg/m  BP Readings from Last 3 Encounters:  05/02/24 (!) 122/58  04/29/24 128/78  04/17/24 122/80   Wt Readings from Last 3 Encounters:  05/02/24 191 lb 12.8 oz (87 kg)  04/29/24 188 lb 9.6 oz (85.5 kg)  04/17/24 188 lb 2 oz (85.3 kg)      Physical Exam Vitals and nursing note reviewed. Exam conducted with a chaperone present.  Constitutional:      Appearance: Normal appearance.  Cardiovascular:     Rate and Rhythm: Normal rate and regular rhythm.     Pulses: Normal pulses.     Heart sounds: Normal heart sounds.  Pulmonary:     Effort: Pulmonary effort is normal.     Breath sounds: Normal breath sounds.  Genitourinary:    General: Normal vulva.     Exam position: Lithotomy position.  Neurological:     Mental Status: She is alert and oriented to person, place, and time.  Psychiatric:        Mood  and Affect: Mood normal.        Behavior: Behavior normal.  Thought Content: Thought content normal.        Judgment: Judgment normal.      Results for orders placed or performed in visit on 05/02/24  POCT Urinalysis Dipstick (Automated)  Result Value Ref Range   Color, UA light amber    Clarity, UA clear    Glucose, UA Negative Negative   Bilirubin, UA neg    Ketones, UA positive    Spec Grav, UA 1.015 1.010 - 1.025   Blood, UA 25 Ery/uL    pH, UA 6.0 5.0 - 8.0   Protein, UA Positive (A) Negative   Urobilinogen, UA 2.0 (A) 0.2 or 1.0 E.U./dL   Nitrite, UA positive    Leukocytes, UA Large (3+) (A) Negative       The ASCVD Risk score (Arnett DK, et al., 2019) failed to calculate for the following reasons:   The 2019 ASCVD risk score is only valid for ages 44 to 1   Risk score cannot be calculated because patient has a medical history suggesting prior/existing ASCVD    Assessment & Plan:  Dysuria -     POCT Urinalysis Dipstick (Automated) -     Urine Culture -     CBC with Differential/Platelet -     Basic metabolic panel with GFR  Urgency of urination -     Urine Culture -     CBC with Differential/Platelet -     Basic metabolic panel with GFR  Vaginal odor -     WET PREP BY MOLECULAR PROBE  Acute cystitis with hematuria -     Ciprofloxacin  HCl; Take 1 tablet (500 mg total) by mouth 2 (two) times daily for 3 days.  Dispense: 6 tablet; Refill: 0    Assessment and Plan Assessment & Plan Dysuria and urinary urgency - POC UA shows 3+ leuks, + nitrites, + blood.  - CBC with diff and BMP STAT pending.  - given presentation and UA, will treat.  - consider her allergies, start Ciprofloxacin  500 mg bid for 3 days.  - encourage fluid intake.  - strict ER precautions given.    Vulvar and vaginal irritation Chronic irritation, worsened by pads and pull-ups. No discharge. Possible contact dermatitis or product irritation. - continue hydrocortisone  as  prescribed.  - Wet prep pending.   Return if symptoms worsen or fail to improve.    Carrol Aurora, NP

## 2024-05-04 ENCOUNTER — Ambulatory Visit: Payer: Self-pay | Admitting: Gastroenterology

## 2024-05-04 DIAGNOSIS — R1310 Dysphagia, unspecified: Secondary | ICD-10-CM

## 2024-05-05 ENCOUNTER — Ambulatory Visit: Payer: Self-pay | Admitting: Family Medicine

## 2024-05-05 LAB — WET PREP BY MOLECULAR PROBE
Candida species: NOT DETECTED
MICRO NUMBER:: 16961040
SPECIMEN QUALITY:: ADEQUATE
Trichomonas vaginosis: NOT DETECTED

## 2024-05-05 LAB — URINE CULTURE
MICRO NUMBER:: 16961041
SPECIMEN QUALITY:: ADEQUATE

## 2024-05-05 MED ORDER — METRONIDAZOLE 500 MG PO TABS: 500.0000 mg | ORAL_TABLET | Freq: Three times a day (TID) | ORAL | 0 refills | Status: DC

## 2024-05-12 ENCOUNTER — Ambulatory Visit: Admitting: Emergency Medicine

## 2024-05-12 ENCOUNTER — Other Ambulatory Visit: Payer: Self-pay | Admitting: Gastroenterology

## 2024-05-12 ENCOUNTER — Ambulatory Visit (HOSPITAL_COMMUNITY)
Admission: RE | Admit: 2024-05-12 | Discharge: 2024-05-12 | Disposition: A | Discharge status: Home / Self Care | Source: Ambulatory Visit | Attending: Gastroenterology | Admitting: Gastroenterology

## 2024-05-12 ENCOUNTER — Telehealth: Payer: Self-pay

## 2024-05-12 DIAGNOSIS — R1310 Dysphagia, unspecified: Secondary | ICD-10-CM

## 2024-05-12 DIAGNOSIS — N760 Acute vaginitis: Secondary | ICD-10-CM

## 2024-05-12 MED ORDER — METRONIDAZOLE 500 MG PO TABS: 500.0000 mg | ORAL_TABLET | Freq: Three times a day (TID) | ORAL | Status: AC

## 2024-05-12 NOTE — Telephone Encounter (Signed)
 Copied from CRM [id number]. Topic: Clinical - Medication Question >> May 12, 2024  8:41 AM Turkey A wrote: Reason for CRM: Patient called and said that Pharmacy told her that metroNIDAZOLE  (FLAGYL ) 500 MG tablet can cause her to bleed internally. Patient would like to know what to do because she stopped taking medication. Patient said to call her soon because she has to leave at 12PM

## 2024-05-12 NOTE — Telephone Encounter (Signed)
 I have never seen a case of bleeding attributed to metronidazole .  It should be okay for her to take.  Thanks.

## 2024-05-12 NOTE — Addendum Note (Signed)
 Addended by: CLEATUS ARLYSS RAMAN on: 05/12/2024 05:00 PM   Modules accepted: Orders

## 2024-05-12 NOTE — Telephone Encounter (Signed)
 Returned call to patient and provider her with Dr. Verdene note. She states that she will finish taking the medication.

## 2024-05-13 ENCOUNTER — Ambulatory Visit: Payer: Self-pay | Admitting: Gastroenterology

## 2024-05-13 NOTE — Telephone Encounter (Signed)
This has been addressed. Thanks.

## 2024-05-13 NOTE — Telephone Encounter (Signed)
 CRM was sent to the wrong office. Forwarding to correct office  Copied from CRM [id number]. Topic: Clinical - Medication Question >> May 12, 2024  8:41 AM Turkey A wrote: Reason for CRM: Patient called and said that Pharmacy told her that metroNIDAZOLE  (FLAGYL ) 500 MG tablet can cause her to bleed internally. Patient would like to know what to do because she stopped taking medication. Patient said to call her soon because she has to leave at 12PM >> May 12, 2024 11:24 AM Mercedes MATSU wrote: Patient requesting to speak with nurse in regards to medication metroNIDAZOLE  (FLAGYL ) 500 MG tablet that she stopped taking. Patient is requesting urgent call back and can be reached at [phone].

## 2024-05-15 ENCOUNTER — Encounter (HOSPITAL_COMMUNITY)

## 2024-05-20 ENCOUNTER — Telehealth: Payer: Self-pay | Admitting: Gastroenterology

## 2024-05-20 NOTE — Telephone Encounter (Signed)
 Noted     Closing encounter

## 2024-05-20 NOTE — Telephone Encounter (Signed)
 Inbound call from patient stating she would like to be scheduled with Dr.Nandigam but can not do early mornings and can not do Fridays and our next availability to be seen in office would be 08/15/24 at 8:30am. Patient would like to know if the nurse has something sooner and not on a Friday. Please advise  Thank you

## 2024-05-20 NOTE — Telephone Encounter (Signed)
 Please call the patient and let her know we have scheduled her for 07/09/2024 at 2:30 PM. If this time does not work for her, we will need to add her to the wait list as we don't have any other available appointments.

## 2024-05-20 NOTE — Telephone Encounter (Signed)
 Patient notified on appt. Patient stated she will be here.  Thank you

## 2024-06-09 ENCOUNTER — Ambulatory Visit: Admitting: Family Medicine

## 2024-06-16 ENCOUNTER — Ambulatory Visit: Admitting: Family Medicine

## 2024-06-24 ENCOUNTER — Other Ambulatory Visit: Payer: Self-pay | Admitting: Family Medicine

## 2024-06-24 NOTE — Telephone Encounter (Unsigned)
 Copied from CRM [id number]. Topic: Clinical - Medication Refill >> Jun 24, 2024  9:37 AM Joesph B wrote: Medication:  lamoTRIgine  (LAMICTAL ) 100 MG tablet  folic acid  (FOLVITE ) 1 MG tablet  Has the patient contacted their pharmacy? Yes (Agent: If no, request that the patient contact the pharmacy for the refill. If patient does not wish to contact the pharmacy document the reason why and proceed with request.) (Agent: If yes, when and what did the pharmacy advise?)  This is the patient's preferred pharmacy:   [facility] - [place], [place] - [address] [place] [address] Phone: [phone] Fax: [phone]  Is this the correct pharmacy for this prescription? Yes If no, delete pharmacy and type the correct one.   Has the prescription been filled recently? Yes  Is the patient out of the medication? No  Has the patient been seen for an appointment in the last year OR does the patient have an upcoming appointment? Yes  Can we respond through MyChart? No  Agent: Please be advised that Rx refills may take up to 3 business days. We ask that you follow-up with your pharmacy.

## 2024-06-25 ENCOUNTER — Telehealth: Payer: Self-pay

## 2024-06-25 MED ORDER — FOLIC ACID 1 MG PO TABS: 1.0000 mg | ORAL_TABLET | Freq: Every day | ORAL | 1 refills | Status: AC

## 2024-06-25 MED ORDER — LAMOTRIGINE 100 MG PO TABS: 150.0000 mg | ORAL_TABLET | Freq: Every day | ORAL | 1 refills | Status: DC

## 2024-06-25 NOTE — Telephone Encounter (Signed)
 Sent. Thanks.

## 2024-06-30 NOTE — Telephone Encounter (Signed)
 Copied from CRM [id number]. Topic: Clinical - Prescription Issue >> Jun 30, 2024  8:34 AM Burnard DEL wrote: Reason for CRM: Patient called in stating that she is supposed to be prescribed 150 mg of Lamotrigine . Patient stated that is what she usually takes and if she reduced to 100mg  ,it could be dangerous for her. Patient stated that provider would have to [facility] to let them know that the wrong dosage was sent tin.   [facility] - [place], [place] - [address]  Phone: [phone] Fax: [phone]

## 2024-07-01 ENCOUNTER — Ambulatory Visit: Payer: Self-pay

## 2024-07-01 ENCOUNTER — Other Ambulatory Visit: Payer: Self-pay | Admitting: Family Medicine

## 2024-07-01 MED ORDER — LAMOTRIGINE 150 MG PO TABS: 150.0000 mg | ORAL_TABLET | Freq: Every day | ORAL | 3 refills | Status: AC

## 2024-07-01 MED ORDER — LAMOTRIGINE 150 MG PO TABS: 150.0000 mg | ORAL_TABLET | Freq: Every day | ORAL | Status: DC

## 2024-07-01 NOTE — Telephone Encounter (Signed)
 FYI Only or Action Required?: Action required by provider: Med Reconciliation .  Patient was last seen in primary care on 05/02/2024 by Vincente Shivers, NP.  Called Nurse Triage reporting Advice Only.  Symptoms began n/a.  Interventions attempted: Other: n/a.  Symptoms are: n/a.  Triage Disposition: Call PCP When Office is Open  Patient/caregiver understands and will follow disposition?: Yes Reason for Disposition  [1] Caller requesting NON-URGENT health information AND [2] PCP's office is the best resource  Answer Assessment - Initial Assessment Questions Advised patient she's prescribed 100 mg and to be taking 1.5 tablets. Patient states no, she's been taking 150 mg, 1 pill as she has a machine that dispenses her medication and she has never been cutting pills. Previous office notes show medication as 100mg  1.5 tablets daily and patient denies this is the case and she has always taken 1 tablet that is 150mg . She is adamant on letting PCP know about her machine that dispenses her medication at the right time and she is to be taking 1 pill at 150mg . Called [facility] and was advised to send over a CRM.   [facility] - [place], [place] - [address] [place] [address] Phone: [phone]   1. REASON FOR CALL: What is the main reason for your call? or How can I best help you?     lamoTRIgine  (LAMICTAL ) 100 MG tablet , patient states its supposed to be 150 mg  Protocols used: Information Only Call - No Triage-A-AH  Copied from CRM [id number]. Topic: Clinical - Medication Question >> Jul 01, 2024  3:36 PM Berneda FALCON wrote: Reason for CRM: Patient has called 3 times in the last 2 days with a concern of the wrong medication dosage.  The medication is lamoTRIgine  (LAMICTAL ) 100 MG tablet   States this is supposed to be 150 MG, not 100 and changing the dosage could have serious side effects for her.  She is not completely out, but she uses  [facility] - [place], [place] - [address] [place] [address] Phone: [phone] Fax: [phone] Hours: Not open 24 hours  And it dispenses the medication at the right time when she is supposed to take it.  Patient callback is [phone] Patient callback is [phone]

## 2024-07-01 NOTE — Telephone Encounter (Signed)
 I thought she was taking 100 mg tablets and using 1.5 tablets at a time.  I resent the prescription for 150 mg tablet, 1 a day.  I sent it to mail order.  Please let me know if she cannot get that filled.  Thanks.

## 2024-07-01 NOTE — Addendum Note (Signed)
 Addended by: CLEATUS ARLYSS RAMAN on: 07/01/2024 04:41 PM   Modules accepted: Orders

## 2024-07-02 NOTE — Telephone Encounter (Signed)
 Called and spoke with patient.  Let her know that Dr. Cleatus has sent in refill for lamoTRIgine  (LAMICTAL ) 150mg  yesterday to her mail order pharmacy.  Patient reported that she has enough medication at home until this arrives.

## 2024-07-03 ENCOUNTER — Ambulatory Visit: Payer: Self-pay

## 2024-07-03 NOTE — Telephone Encounter (Signed)
Will see tomorrow. Thanks.  

## 2024-07-03 NOTE — Telephone Encounter (Signed)
 Patient scheduled 07/04/2024

## 2024-07-03 NOTE — Telephone Encounter (Signed)
 FYI Only or Action Required?: FYI only for provider: appointment scheduled on 07/04/2024.  Patient was last seen in primary care on 05/02/2024 by Vincente Shivers, NP.  Called Nurse Triage reporting Urinary Frequency.  Symptoms began several days ago.  Interventions attempted: Nothing.  Symptoms are: gradually worsening.  Triage Disposition: See Physician Within 24 Hours  Patient/caregiver understands and will follow disposition?: Yes           Copied from CRM #[id number]. Topic: Clinical - Red Word Triage >> Jul 03, 2024  8:51 AM Robin Juarez wrote: Red Word that prompted transfer to Nurse Triage: Pt called in stating that when she is going to urinate it burns. Pt has to use the bathroom more frequently than normal. There is no blood in pt urine. Warm transferred to nurse triage. Reason for Disposition  Age > 50 years  Answer Assessment - Initial Assessment Questions 1. SEVERITY: How bad is the pain?  (e.g., Scale 1-10; mild, moderate, or severe)     7 or 8 out of 10 2. FREQUENCY: How many times have you had painful urination today?      Once today  3. PATTERN: Is pain present every time you urinate or just sometimes?      Comes and goes  4. ONSET: When did the painful urination start?      2 days ago  5. FEVER: Do you have a fever? If Yes, ask: What is your temperature, how was it measured, and when did it start?     Unsure but states she does not think so  6. PAST UTI: Have you had a urine infection before? If Yes, ask: When was the last time? and What happened that time?      Yes had one 2- 3 months ago  7. OTHER SYMPTOMS: Do you have any other symptoms? (e.g., blood in urine, flank pain, genital sores, urgency, vaginal discharge)     Urgency  Patient denies taking medications for pain. Pt also denies flank pain or pain in her lower back.  Protocols used: Urination Pain - Female-A-AH

## 2024-07-03 NOTE — Telephone Encounter (Signed)
 Copied from CRM #[id number]. Topic: General - Other >> Jul 01, 2024 10:27 AM Robin Juarez wrote: Reason for CRM: patient is calling for status of the refill correctional she called about yesterday. She aid she was told that if she reduces to the 100 MG it can cause her to have seizures and really needs the 150 mg called into the pharmacy.

## 2024-07-03 NOTE — Telephone Encounter (Signed)
 Tried to call pt. No answer and no VM to leave a message. The 150 mg was sent to [facility] 07-01[facility] and 3 refills.   Please provide message from provider/office when call is returned from patient.

## 2024-07-04 ENCOUNTER — Encounter: Payer: Self-pay | Admitting: Family Medicine

## 2024-07-04 ENCOUNTER — Ambulatory Visit: Admitting: Family Medicine

## 2024-07-04 VITALS — BP 130/78 | HR 93 | Temp 97.9°F | Ht 60.5 in | Wt 190.8 lb

## 2024-07-04 DIAGNOSIS — R350 Frequency of micturition: Secondary | ICD-10-CM | POA: Diagnosis not present

## 2024-07-04 LAB — POCT URINALYSIS DIPSTICK
Bilirubin, UA: NEGATIVE
Blood, UA: NEGATIVE
Glucose, UA: NEGATIVE
Ketones, UA: NEGATIVE
Nitrite, UA: NEGATIVE
Protein, UA: NEGATIVE
Spec Grav, UA: 1.01 (ref 1.010–1.025)
Urobilinogen, UA: 0.2 U/dL
pH, UA: 6 (ref 5.0–8.0)

## 2024-07-04 MED ORDER — AMOXICILLIN 875 MG PO TABS: 875.0000 mg | ORAL_TABLET | Freq: Two times a day (BID) | ORAL | 0 refills | Status: AC

## 2024-07-04 NOTE — Patient Instructions (Signed)
 If you have more burning with urination, then start amoxil .  We'll update you about your labs.  Take care.  Glad to see you.

## 2024-07-04 NOTE — Progress Notes (Signed)
 dysuria: intermittently noted.  duration of symptoms: last 2 days.   abdominal pain:no fevers:no back pain: she has back pain at baseline.  vomiting:no She has been able tolerate amoxicillin  previously.  Ucx pending.   She has rare swallowing changes now- this is better than prior.    D/w pt about lamictal  dosing, ie 1 tab of 150mg  per day.    Meds, vitals, and allergies reviewed.   Per HPI unless specifically indicated in ROS section   GEN: nad, alert and oriented HEENT: mucous membranes moist NECK: supple CV: rrr.  PULM: ctab, no inc wob ABD: soft, +bs, suprapubic area not tender EXT: no edema SKIN: no acute rash BACK: no CVA pain

## 2024-07-05 DIAGNOSIS — R350 Frequency of micturition: Secondary | ICD-10-CM | POA: Insufficient documentation

## 2024-07-05 NOTE — Assessment & Plan Note (Signed)
 Possible cystitis.  She has been able tolerate amoxicillin  previously.  Ucx pending.  U/a d/w pt . If having more burning with urination, then start amoxil .  Update me as needed.   See notes on labs.

## 2024-07-06 ENCOUNTER — Ambulatory Visit: Payer: Self-pay | Admitting: Family Medicine

## 2024-07-06 LAB — URINALYSIS, MICROSCOPIC ONLY
Hyaline Cast: NONE SEEN /LPF
RBC / HPF: NONE SEEN /HPF (ref 0–2)
Squamous Epithelial / HPF: NONE SEEN /HPF (ref <=5)

## 2024-07-06 LAB — URINE CULTURE
MICRO NUMBER:: 17237224
SPECIMEN QUALITY:: ADEQUATE

## 2024-07-09 ENCOUNTER — Encounter: Payer: Self-pay | Admitting: Family Medicine

## 2024-07-09 ENCOUNTER — Ambulatory Visit: Admitting: Gastroenterology

## 2024-07-10 NOTE — Telephone Encounter (Signed)
 Transferred my chart note to Robin Juarez 01/21/1930 who the mychart note is about.

## 2024-07-11 NOTE — Telephone Encounter (Signed)
 Please see phone note

## 2024-07-22 NOTE — Telephone Encounter (Signed)
 error

## 2024-07-30 ENCOUNTER — Ambulatory Visit: Admitting: Family Medicine

## 2024-08-01 ENCOUNTER — Encounter: Payer: Self-pay | Admitting: Family Medicine

## 2024-08-06 ENCOUNTER — Ambulatory Visit: Admitting: Family Medicine

## 2024-08-06 ENCOUNTER — Other Ambulatory Visit: Payer: Self-pay

## 2024-08-06 ENCOUNTER — Ambulatory Visit

## 2024-08-06 VITALS — BP 130/70 | HR 82 | Ht 60.5 in | Wt 192.0 lb

## 2024-08-06 DIAGNOSIS — G8929 Other chronic pain: Secondary | ICD-10-CM

## 2024-08-06 DIAGNOSIS — M25512 Pain in left shoulder: Secondary | ICD-10-CM | POA: Diagnosis not present

## 2024-08-06 DIAGNOSIS — M25511 Pain in right shoulder: Secondary | ICD-10-CM | POA: Diagnosis not present

## 2024-08-06 DIAGNOSIS — M25551 Pain in right hip: Secondary | ICD-10-CM

## 2024-08-06 NOTE — Progress Notes (Unsigned)
 I, Claretha Schimke am a scribe for Dr. Artist Lloyd, MD.  Robin Juarez is a 89 y.o. female who presents to [facility] today for shoulder and hip pain. Pt was last seen by Dr. Lloyd on 11/23/23 and was given a R interarticular hip steroid injection  Today, pt reports right shoulder pain for about two months. Left arm hurts hurts as well but not as bad as the right. The right hip is hurting today as well. Would like to get injections in both today.   She notes the left shoulder is also a bit painful especially along the clavicle region.  She denies any recent injuries.  Dx imaging: 11/16/23 R hip XR   Pertinent review of systems: No fevers or chills  Relevant historical information: History of a stroke   Exam:  BP 130/70   Pulse 82   Ht 5' 0.5 (1.537 m)   Wt 192 lb (87.1 kg)   SpO2 94%   BMI 36.88 kg/m  General: Well Developed, well nourished, and in no acute distress.   MSK: Right shoulder normal-appearing decreased range of motion intact strength.  Left shoulder: Normal-appearing Normal motion. Tender palpation left clavicle.  Right hip normal-appearing Tender palpation right lateral hip at greater trochanter.  Pain with resisted abduction.   Lab and Radiology Results  Procedure: Real-time Ultrasound Guided Injection of right shoulder glenohumeral joint posterior approach Device: Philips Affiniti 50G/GE Logiq Images permanently stored and available for review in PACS Verbal informed consent obtained.  Discussed risks and benefits of procedure. Warned about infection, bleeding, hyperglycemia damage to structures among others. Patient expresses understanding and agreement Time-out conducted.   Noted no overlying erythema, induration, or other signs of local infection.   Skin prepped in a sterile fashion.   Local anesthesia: Topical Ethyl chloride.   With sterile technique and under real time ultrasound guidance: 40 mg of Kenalog  and  2 mL of Marcaine  injected into right shoulder glenohumeral joint. Fluid seen entering the joint capsule.   Completed without difficulty   Pain immediately resolved suggesting accurate placement of the medication.   Advised to call if fevers/chills, erythema, induration, drainage, or persistent bleeding.   Images permanently stored and available for review in the ultrasound unit.  Impression: Technically successful ultrasound guided injection.    Procedure: Real-time Ultrasound Guided Injection of right hip lateral aspect greater trochanter Device: Philips Affiniti 50G/GE Logiq Images permanently stored and available for review in PACS Verbal informed consent obtained.  Discussed risks and benefits of procedure. Warned about infection, bleeding, hyperglycemia damage to structures among others. Patient expresses understanding and agreement Time-out conducted.   Noted no overlying erythema, induration, or other signs of local infection.   Skin prepped in a sterile fashion.   Local anesthesia: Topical Ethyl chloride.   With sterile technique and under real time ultrasound guidance: 40 mg of Kenalog  and 2 mL of Marcaine  injected into right lateral hip greater trochanter bursa. Fluid seen entering the bursa.   Completed without difficulty   Pain immediately resolved suggesting accurate placement of the medication.   Advised to call if fevers/chills, erythema, induration, drainage, or persistent bleeding.   Images permanently stored and available for review in the ultrasound unit.  Impression: Technically successful ultrasound guided injection.   X-ray images bilateral shoulders obtained today personally and independently interpreted.    Right shoulder: Previous fracture distal clavicle stable appearing with some callus formation.  No acute fractures are visible.  Right glenohumeral DJD  is present.  Left shoulder: No acute fractures.  Moderate glenohumeral DJD.  Await formal radiology  review     Assessment and Plan: 89 y.o. female with chronic right shoulder and right lateral hip pain. Plan for injection right shoulder glenohumeral joint and right lateral hip greater trochanteric bursa.    Left shoulder pain: Etiology is a bit unclear.  She does have glenohumeral DJD however her pain is mostly along the clavicle which could be related to muscle dysfunction.  Plan for a bit of watchful waiting.    PDMP not reviewed this encounter. Orders Placed This Encounter  Procedures   US  LIMITED JOINT SPACE STRUCTURES UP RIGHT(NO LINKED CHARGES)    Reason for Exam (SYMPTOM  OR DIAGNOSIS REQUIRED):   shoulder pain    Preferred imaging location?:   [facility] Shoulder Left    Standing Status:   Future    Number of Occurrences:   1    Expiration Date:   08/06/2025    Reason for Exam (SYMPTOM  OR DIAGNOSIS REQUIRED):   BL shoulder pain    Preferred imaging location?:   [facility]   DG Shoulder Right    Standing Status:   Future    Number of Occurrences:   1    Expiration Date:   08/06/2025    Reason for Exam (SYMPTOM  OR DIAGNOSIS REQUIRED):   BL shoulder pain    Preferred imaging location?:   [facility]   No orders of the defined types were placed in this encounter.    Discussed warning signs or symptoms. Please see discharge instructions. Patient expresses understanding.   The above documentation has been reviewed and is accurate and complete Artist Lloyd, M.D.

## 2024-08-06 NOTE — Patient Instructions (Signed)
Thank you for coming in today.   Call or go to the ER if you develop a large red swollen joint with extreme pain or oozing puss.    Please get an Xray today before you leave   Recheck as needed.    

## 2024-08-21 ENCOUNTER — Ambulatory Visit: Payer: Self-pay | Admitting: Family Medicine

## 2024-08-21 NOTE — Progress Notes (Signed)
 Left shoulder x-ray shows a medium arthritis of the shoulder joint.  Well.  No broken bones are visible.

## 2024-08-21 NOTE — Progress Notes (Signed)
 Shoulder x-ray shows medium arthritis

## 2025-01-15 ENCOUNTER — Ambulatory Visit

## 2025-01-20 ENCOUNTER — Ambulatory Visit
